# Patient Record
Sex: Male | Born: 1955
Health system: Southern US, Community
[De-identification: ages and names within clinical notes are randomized; demographics above are authoritative.]

## PROBLEM LIST (undated history)

## (undated) DIAGNOSIS — J449 Chronic obstructive pulmonary disease, unspecified: Secondary | ICD-10-CM

## (undated) DIAGNOSIS — F419 Anxiety disorder, unspecified: Secondary | ICD-10-CM

## (undated) DIAGNOSIS — I10 Essential (primary) hypertension: Secondary | ICD-10-CM

## (undated) DIAGNOSIS — I219 Acute myocardial infarction, unspecified: Secondary | ICD-10-CM

## (undated) DIAGNOSIS — I729 Aneurysm of unspecified site: Secondary | ICD-10-CM

## (undated) DIAGNOSIS — I2510 Atherosclerotic heart disease of native coronary artery without angina pectoris: Secondary | ICD-10-CM

## (undated) HISTORY — PX: TONSILLECTOMY: SUR1361

## (undated) HISTORY — DX: Anxiety disorder, unspecified: F41.9

## (undated) HISTORY — PX: ABDOMINAL AORTIC ANEURYSM REPAIR: SHX42

## (undated) HISTORY — PX: VASCULAR SURGERY: SHX849

## (undated) SURGERY — Surgical Case
Anesthesia: *Unknown

---

## 2000-08-30 ENCOUNTER — Encounter: Payer: Self-pay | Admitting: Emergency Medicine

## 2000-08-30 ENCOUNTER — Emergency Department (HOSPITAL_COMMUNITY): Admission: EM | Admit: 2000-08-30 | Discharge: 2000-08-30 | Payer: Self-pay | Admitting: Emergency Medicine

## 2008-12-29 ENCOUNTER — Emergency Department (HOSPITAL_BASED_OUTPATIENT_CLINIC_OR_DEPARTMENT_OTHER): Admission: EM | Admit: 2008-12-29 | Discharge: 2008-12-29 | Payer: Self-pay | Admitting: Emergency Medicine

## 2009-06-02 ENCOUNTER — Ambulatory Visit: Payer: Self-pay | Admitting: Pulmonary Disease

## 2009-06-02 ENCOUNTER — Ambulatory Visit: Payer: Self-pay | Admitting: Vascular Surgery

## 2009-06-02 ENCOUNTER — Inpatient Hospital Stay (HOSPITAL_COMMUNITY): Admission: EM | Admit: 2009-06-02 | Discharge: 2009-06-09 | Payer: Self-pay | Admitting: Emergency Medicine

## 2009-06-02 ENCOUNTER — Encounter: Payer: Self-pay | Admitting: Vascular Surgery

## 2009-06-16 ENCOUNTER — Ambulatory Visit: Payer: Self-pay | Admitting: Vascular Surgery

## 2009-06-20 ENCOUNTER — Ambulatory Visit: Payer: Self-pay | Admitting: Vascular Surgery

## 2009-08-08 ENCOUNTER — Ambulatory Visit: Payer: Self-pay | Admitting: Vascular Surgery

## 2009-08-22 ENCOUNTER — Ambulatory Visit: Payer: Self-pay | Admitting: Vascular Surgery

## 2009-09-05 ENCOUNTER — Ambulatory Visit: Payer: Self-pay | Admitting: Vascular Surgery

## 2009-10-14 ENCOUNTER — Emergency Department (HOSPITAL_BASED_OUTPATIENT_CLINIC_OR_DEPARTMENT_OTHER): Admission: EM | Admit: 2009-10-14 | Discharge: 2009-10-14 | Payer: Self-pay | Admitting: Emergency Medicine

## 2009-10-27 ENCOUNTER — Emergency Department (HOSPITAL_COMMUNITY): Admission: EM | Admit: 2009-10-27 | Discharge: 2009-10-27 | Payer: Self-pay | Admitting: Family Medicine

## 2010-02-06 ENCOUNTER — Ambulatory Visit: Payer: Self-pay | Admitting: Vascular Surgery

## 2010-08-18 IMAGING — CT CT PELVIS W/O CM
2 of 4 series · 16 of 46 positions shown, 18 images · IV contrast (APPLIED)
Comparison: None.

CT CHEST

CLINICAL DATA: Abdominal pain.  Hypotensive.  Unresponsive.

CT CHEST, ABDOMEN AND PELVIS WITHOUT CONTRAST
TECHNIQUE: Multidetector CT imaging of the chest, abdomen and
pelvis was performed following the standard protocol without IV
contrast.

[Series 2: c/a/p 5.0 b31f · axial · 0.70mm/px · z∈[-660,-30]mm · 13 of 140 slices shown, 15 images]
[im 7/140  soft-tissue]
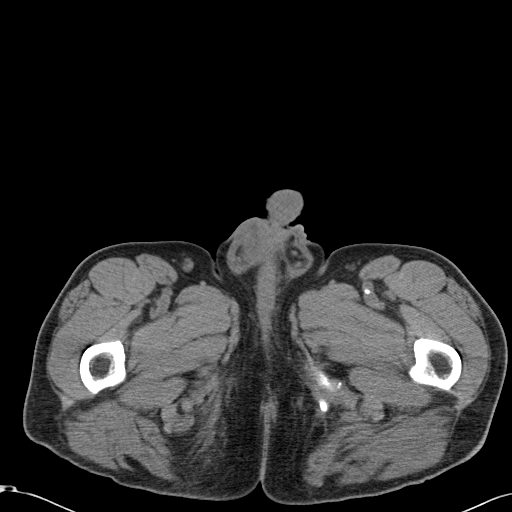
[im 7/140  bone]
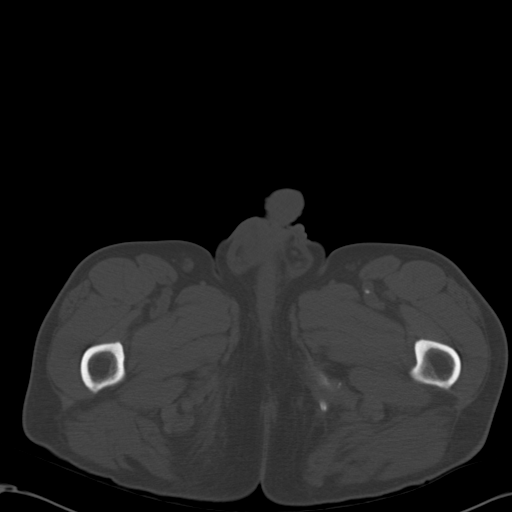
[im 19/140  soft-tissue]
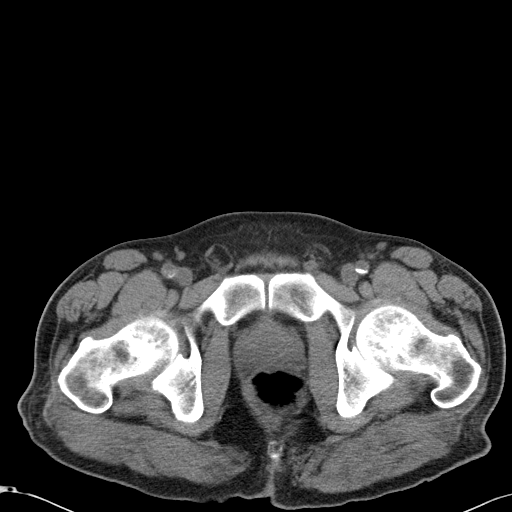
[im 32/140  soft-tissue]
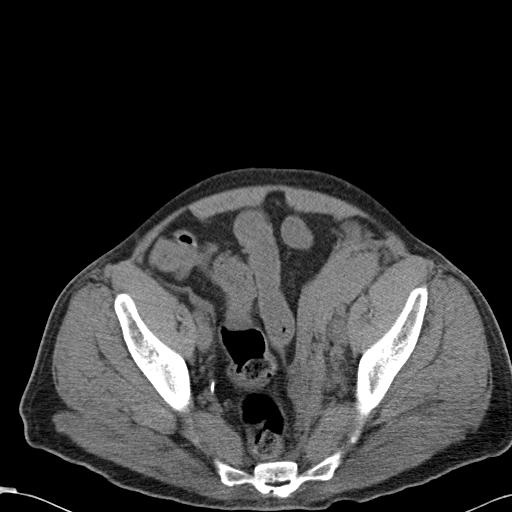
[im 38/140  soft-tissue]
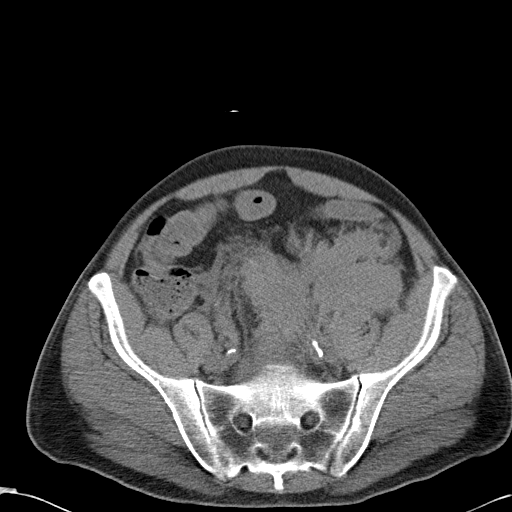
[im 51/140  soft-tissue]
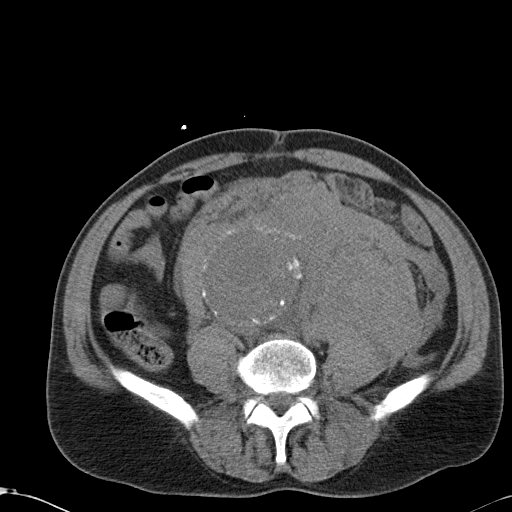
[im 57/140  soft-tissue]
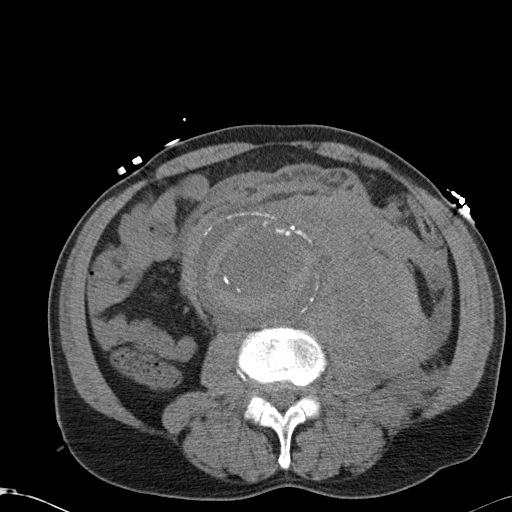
[im 70/140  soft-tissue]
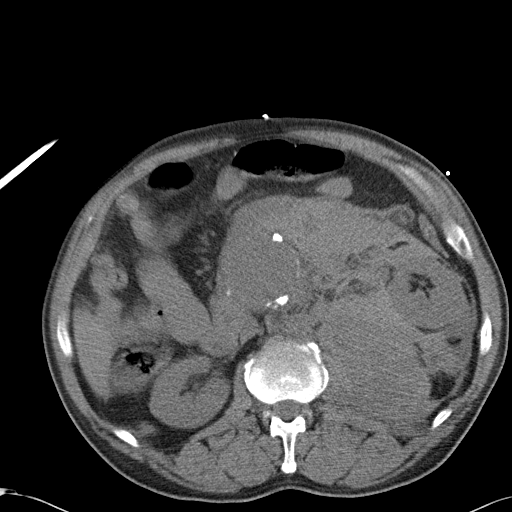
[im 83/140  soft-tissue]
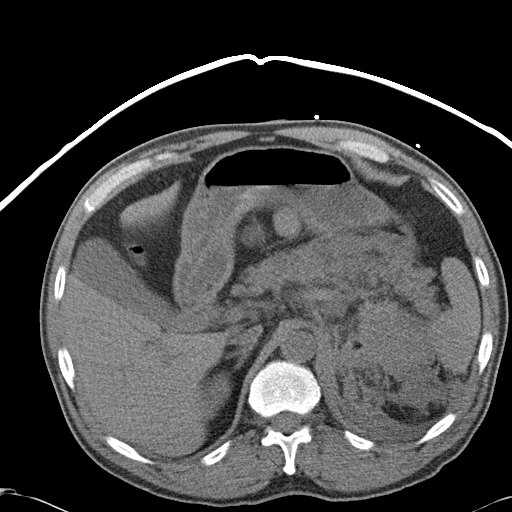
[im 89/140  soft-tissue]
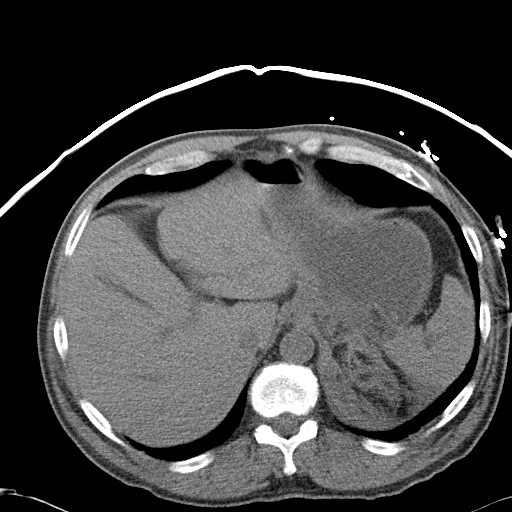
[im 89/140  bone]
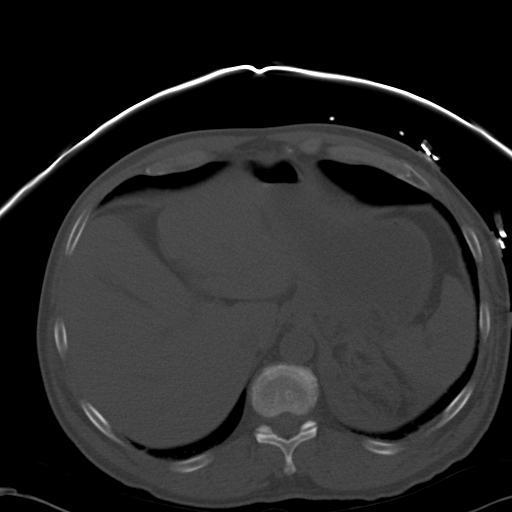
[im 102/140  soft-tissue]
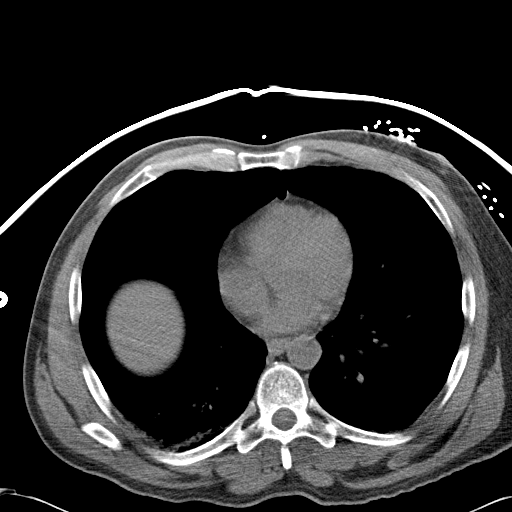
[im 108/140  soft-tissue]
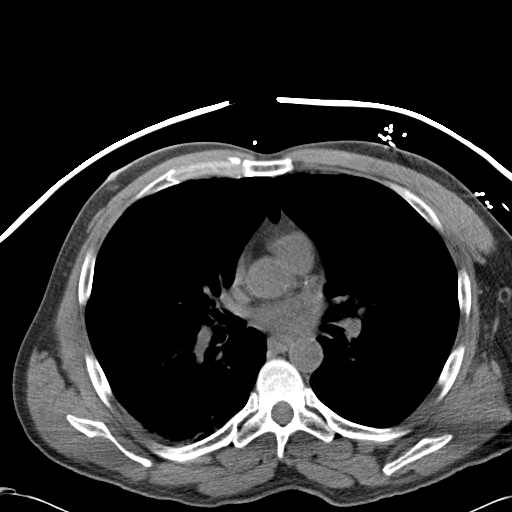
[im 121/140  soft-tissue]
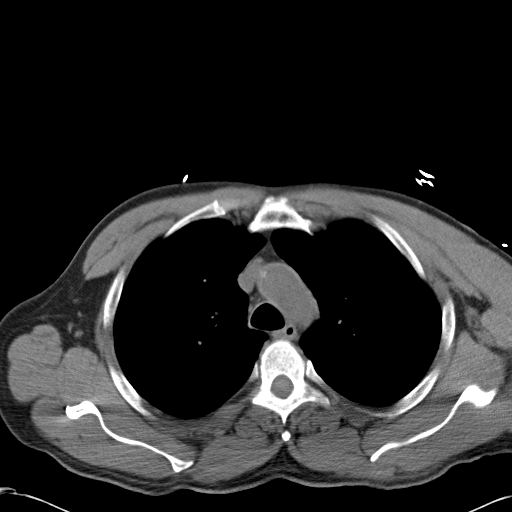
[im 133/140  soft-tissue]
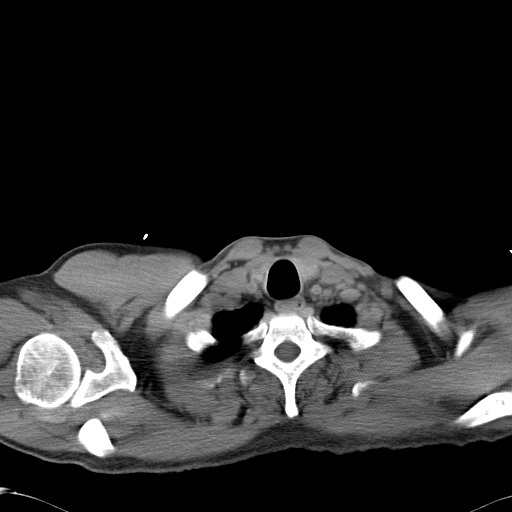

[Series 602: cor · coronal · 1.36mm/px · 3 of 128 slices shown]
[im 43/128  soft-tissue]
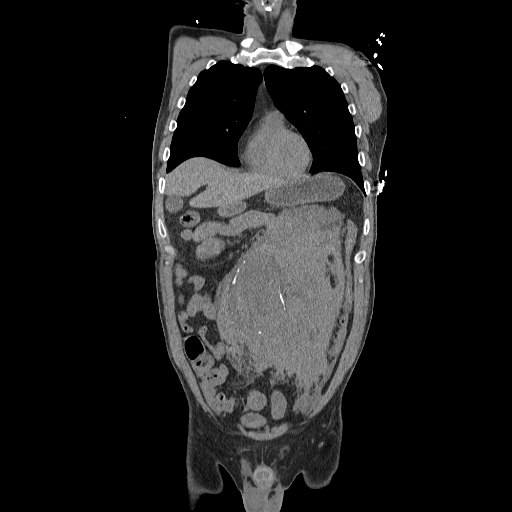
[im 57/128  soft-tissue]
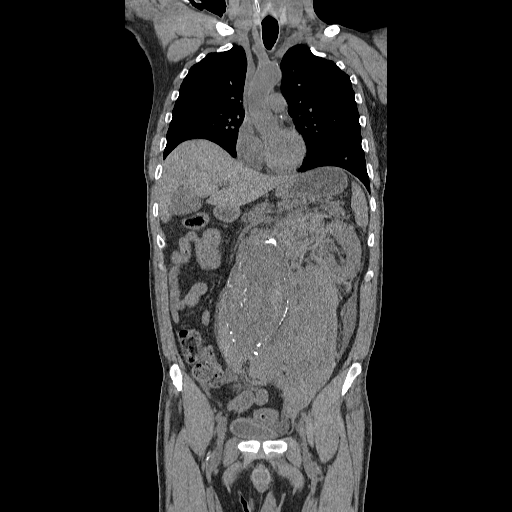
[im 71/128  soft-tissue]
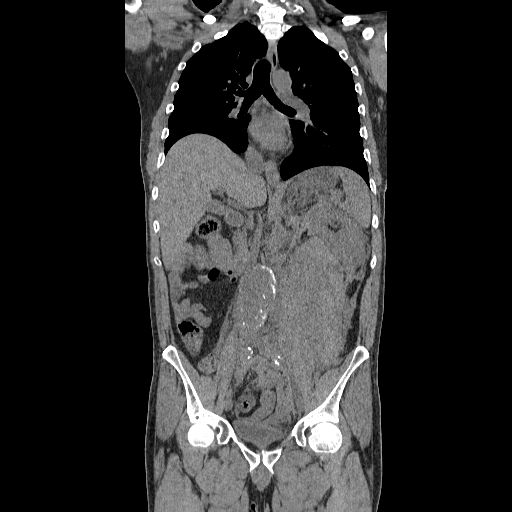

[16 of 46 positions shown; findings below may reference images not displayed]

FINDINGS: Diffuse low density of the intravascular blood relative
to the arterial walls, compatible with anemia.  Diffuse
peribronchial thickening.  Mild changes of COPD.  No masses or
enlarged lymph nodes.  No intrathoracic hemorrhage.  Mild thoracic
spine degenerative changes.
IMPRESSION: 1.  Mild changes of COPD and chronic bronchitis.
2.  Anemia.
3.  No acute abnormality.

CT ABDOMEN
FINDINGS: Huge distal abdominal aortic aneurysm measuring 8.8 cm
in maximum diameter.  Intimal defect and upward and lateral
displacement of the intima in the aorta on the left at the level of
the top of the right iliac crest.  Large retroperitoneal hematoma
on the left with some extension to the right.  This is causing
anterior and lateral displacement of the left kidney and extends
into the pelvis.  This is also displacing the aorta to the right.
Unremarkable spleen, liver, gallbladder and right adrenal gland.
The left adrenal gland is obscured by the hemorrhage.  The pancreas
is displaced anteriorly.  Lumbar spine degenerative changes with
associated grade 1 anterolisthesis at the L3-4 level.  Displaced
bowel loops.
IMPRESSION: Huge, ruptured abdominal aortic aneurysm with a large amount of
retroperitoneal hemorrhage, as described above.  Dr. Loton is
aware and has taken the the patient to the operating room.

CT PELVIS
FINDINGS: Large retroperitoneal hematoma in the upper pelvis
centered to the left of midline and extending into the inferior
pelvis on the left.  This is causing some displacement and
compression of the urinary bladder to the right.  This is also
displacing bowel loops.  Small amount of more simple appearing free
peritoneal fluid.  The hematoma is obscuring the common iliac
arteries.  The visualized portion of the left common iliac artery,
containing calcifications, does not appear dilated.  The visualized
portion of the right common iliac artery, containing
calcifications, appears mildly dilated, measuring 1.4 cm in maximum
diameter on image number 99.  Unremarkable pelvic bones.
IMPRESSION: 1.  Large retroperitoneal hematoma, as described above.
2.  1.4 cm right common iliac artery aneurysm.

## 2010-08-19 IMAGING — CR DG CHEST 1V PORT
1 series · 1 of 1 positions shown · non-contrast
Comparison: 06/02/2009.

CLINICAL DATA: Postop for AAA repair

PORTABLE CHEST - 1 VIEW

[AP]
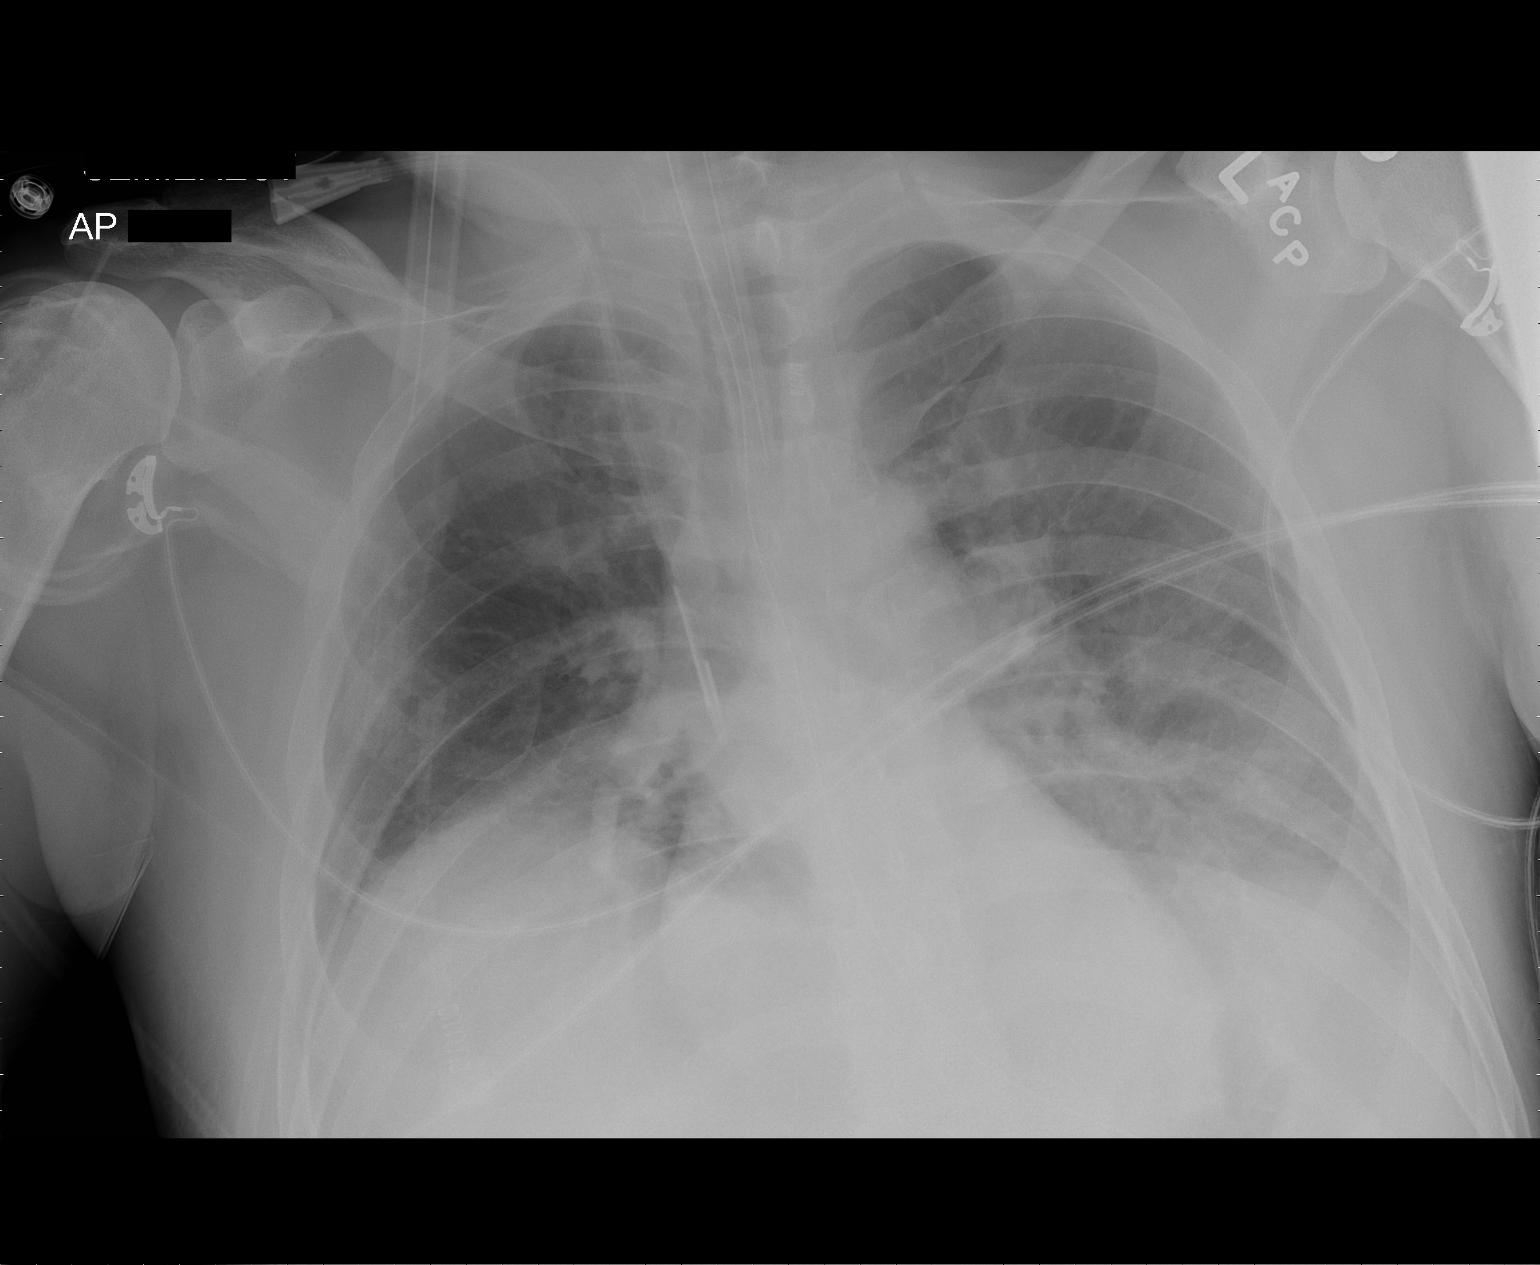

[1 of 1 positions shown; findings below may reference images not displayed]

FINDINGS: 3447 hours.  Endotracheal tube tip is 3.4 cm above the
base of the carina.  NG tube passes into the stomach although the
distal tip is not visualized.  Right IJ central venous catheter tip
projects at the distal SVC level.

Asymmetric elevation of the right hemidiaphragm is associated with
vascular congestion and bibasilar atelectasis. Probable small left
pleural effusion.
IMPRESSION: Slight increase in basilar atelectasis with underlying vascular
congestion.

## 2010-08-20 IMAGING — CR DG CHEST 1V PORT
1 series · 1 of 1 positions shown · non-contrast
Comparison: the previous day's study

CLINICAL DATA: Aortic aneurysm

PORTABLE CHEST - 1 VIEW

[view not recorded]
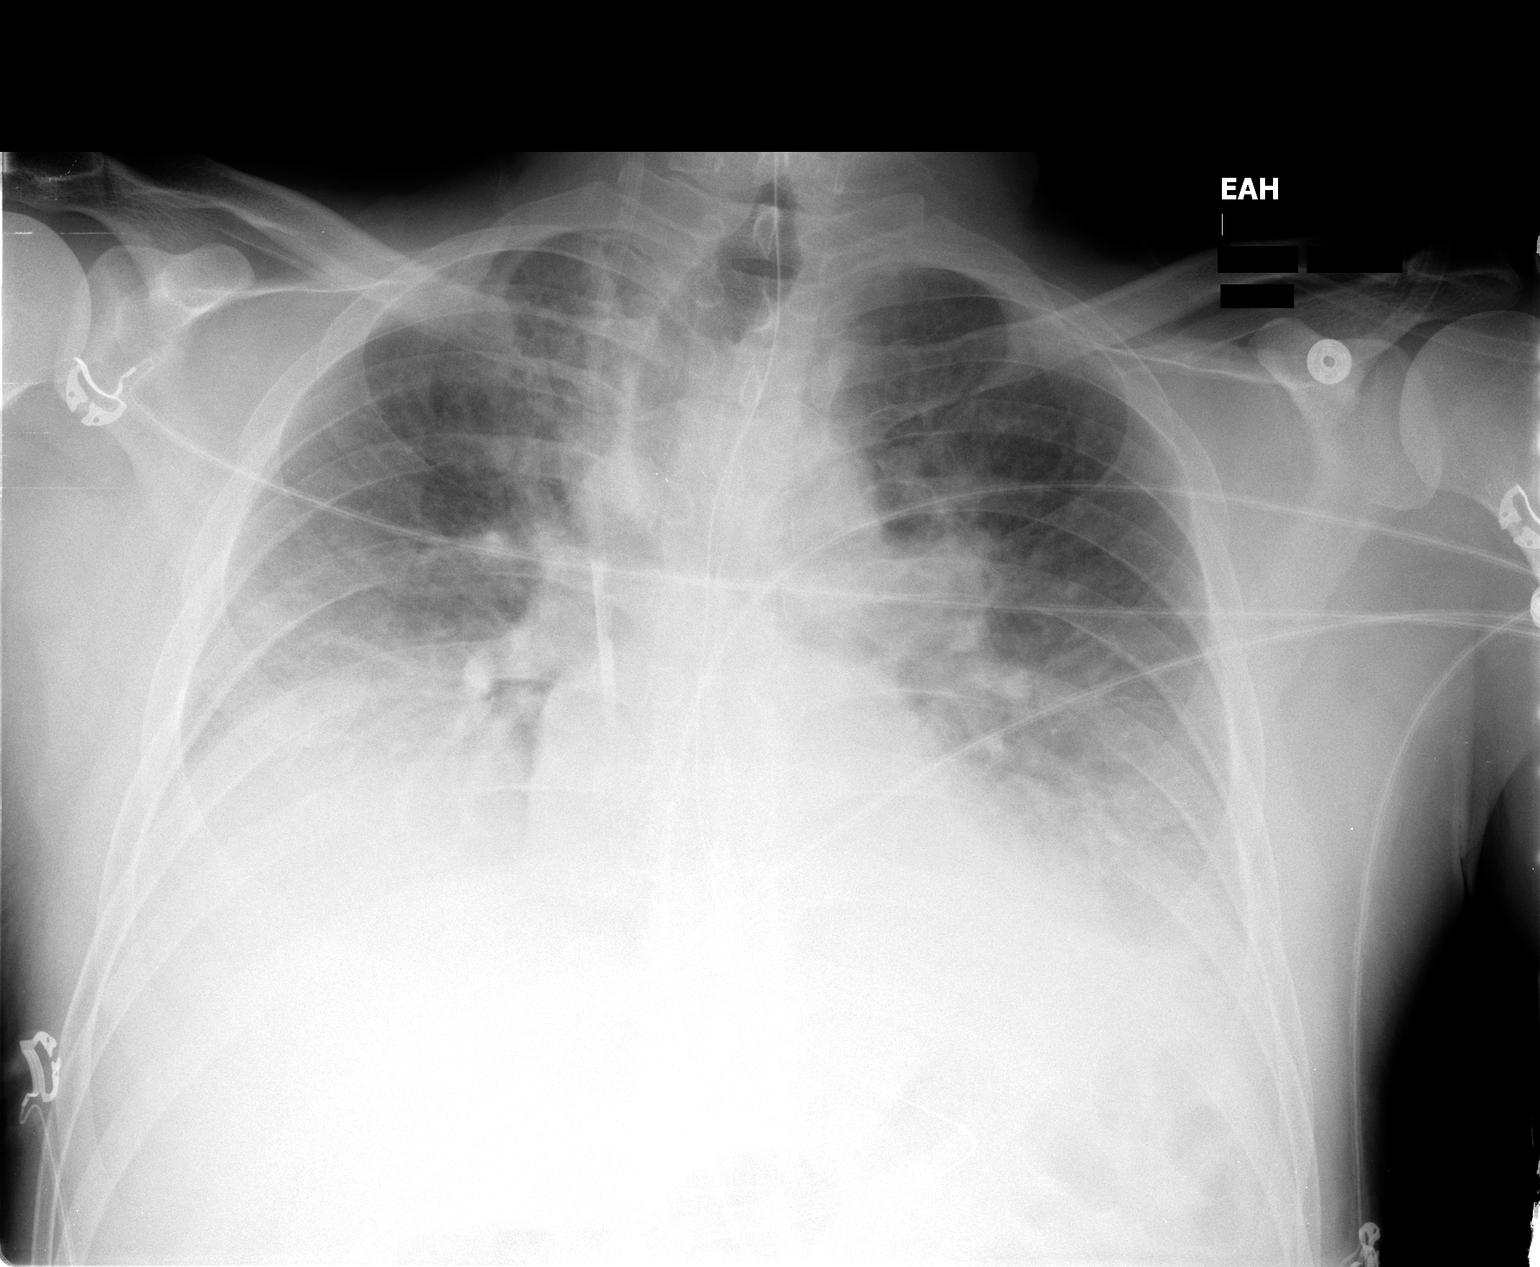

[1 of 1 positions shown; findings below may reference images not displayed]

FINDINGS: The patient has been extubated.  The nasogastric tube and
right IJ central line are stable in position.  Low lung volumes.
There are probable layering pleural effusions.  Associated
atelectasis or infiltrates in the lung bases.  Some increase in
conspicuity of interstitial edema or infiltrates.
IMPRESSION: 1.  Extubation with slight worsening of bilateral edema or
infiltrates with probable layering effusions.

## 2010-09-24 ENCOUNTER — Emergency Department (HOSPITAL_COMMUNITY)
Admission: EM | Admit: 2010-09-24 | Discharge: 2010-09-24 | Payer: Self-pay | Source: Home / Self Care | Admitting: Family Medicine

## 2011-02-02 LAB — TYPE AND SCREEN: Antibody Screen: NEGATIVE

## 2011-02-02 LAB — PREPARE PLATELETS

## 2011-02-02 LAB — PREPARE FRESH FROZEN PLASMA

## 2011-02-02 LAB — CBC
HCT: 21.7 % — ABNORMAL LOW (ref 39.0–52.0)
HCT: 35.1 % — ABNORMAL LOW (ref 39.0–52.0)
HCT: 36.1 % — ABNORMAL LOW (ref 39.0–52.0)
HCT: 36.8 % — ABNORMAL LOW (ref 39.0–52.0)
Hemoglobin: 10.9 g/dL — ABNORMAL LOW (ref 13.0–17.0)
Hemoglobin: 12.1 g/dL — ABNORMAL LOW (ref 13.0–17.0)
Hemoglobin: 12.4 g/dL — ABNORMAL LOW (ref 13.0–17.0)
Hemoglobin: 13 g/dL (ref 13.0–17.0)
Hemoglobin: 7.4 g/dL — CL (ref 13.0–17.0)
MCHC: 34.4 g/dL (ref 30.0–36.0)
MCHC: 34.5 g/dL (ref 30.0–36.0)
MCHC: 34.5 g/dL (ref 30.0–36.0)
MCHC: 34.6 g/dL (ref 30.0–36.0)
MCHC: 34.6 g/dL (ref 30.0–36.0)
MCV: 88.3 fL (ref 78.0–100.0)
MCV: 88.6 fL (ref 78.0–100.0)
MCV: 88.7 fL (ref 78.0–100.0)
MCV: 89.4 fL (ref 78.0–100.0)
MCV: 89.9 fL (ref 78.0–100.0)
MCV: 92.5 fL (ref 78.0–100.0)
Platelets: 111 10*3/uL — ABNORMAL LOW (ref 150–400)
Platelets: 123 10*3/uL — ABNORMAL LOW (ref 150–400)
Platelets: 73 10*3/uL — ABNORMAL LOW (ref 150–400)
RBC: 2.35 MIL/uL — ABNORMAL LOW (ref 4.22–5.81)
RBC: 3.13 MIL/uL — ABNORMAL LOW (ref 4.22–5.81)
RBC: 3.46 MIL/uL — ABNORMAL LOW (ref 4.22–5.81)
RBC: 3.53 MIL/uL — ABNORMAL LOW (ref 4.22–5.81)
RBC: 3.92 MIL/uL — ABNORMAL LOW (ref 4.22–5.81)
RBC: 4.02 MIL/uL — ABNORMAL LOW (ref 4.22–5.81)
RDW: 14.3 % (ref 11.5–15.5)
RDW: 14.7 % (ref 11.5–15.5)
RDW: 14.7 % (ref 11.5–15.5)
RDW: 14.8 % (ref 11.5–15.5)
RDW: 15.1 % (ref 11.5–15.5)
WBC: 10.7 10*3/uL — ABNORMAL HIGH (ref 4.0–10.5)
WBC: 12.1 10*3/uL — ABNORMAL HIGH (ref 4.0–10.5)
WBC: 17.9 10*3/uL — ABNORMAL HIGH (ref 4.0–10.5)
WBC: 7.2 10*3/uL (ref 4.0–10.5)
WBC: 7.8 10*3/uL (ref 4.0–10.5)

## 2011-02-02 LAB — PREPARE CRYOPRECIPITATE

## 2011-02-02 LAB — POCT I-STAT 7, (LYTES, BLD GAS, ICA,H+H)
Bicarbonate: 14.3 mEq/L — ABNORMAL LOW (ref 20.0–24.0)
Bicarbonate: 23.5 mEq/L (ref 20.0–24.0)
Calcium, Ion: 1 mmol/L — ABNORMAL LOW (ref 1.12–1.32)
Calcium, Ion: 1.56 mmol/L — ABNORMAL HIGH (ref 1.12–1.32)
HCT: 22 % — ABNORMAL LOW (ref 39.0–52.0)
HCT: 30 % — ABNORMAL LOW (ref 39.0–52.0)
Hemoglobin: 7.5 g/dL — CL (ref 13.0–17.0)
Hemoglobin: 8.2 g/dL — ABNORMAL LOW (ref 13.0–17.0)
Hemoglobin: 8.8 g/dL — ABNORMAL LOW (ref 13.0–17.0)
O2 Saturation: 100 %
O2 Saturation: 100 %
Patient temperature: 33.7
Patient temperature: 34.4
Patient temperature: 34.6
Potassium: 2.6 mEq/L — CL (ref 3.5–5.1)
Sodium: 141 mEq/L (ref 135–145)
TCO2: 16 mmol/L (ref 0–100)
TCO2: 25 mmol/L (ref 0–100)
pCO2 arterial: 41.6 mmHg (ref 35.0–45.0)
pCO2 arterial: 50.1 mmHg — ABNORMAL HIGH (ref 35.0–45.0)
pCO2 arterial: 51.9 mmHg — ABNORMAL HIGH (ref 35.0–45.0)
pH, Arterial: 7.05 — CL (ref 7.350–7.450)
pH, Arterial: 7.332 — ABNORMAL LOW (ref 7.350–7.450)
pH, Arterial: 7.401 (ref 7.350–7.450)
pO2, Arterial: 368 mmHg — ABNORMAL HIGH (ref 80.0–100.0)
pO2, Arterial: 501 mmHg — ABNORMAL HIGH (ref 80.0–100.0)

## 2011-02-02 LAB — AMYLASE: Amylase: 99 U/L (ref 27–131)

## 2011-02-02 LAB — PROTIME-INR
INR: 1.1 (ref 0.00–1.49)
INR: 1.2 (ref 0.00–1.49)
INR: 1.6 — ABNORMAL HIGH (ref 0.00–1.49)
Prothrombin Time: 15 seconds (ref 11.6–15.2)

## 2011-02-02 LAB — POCT I-STAT 3, ART BLOOD GAS (G3+)
Bicarbonate: 25.3 mEq/L — ABNORMAL HIGH (ref 20.0–24.0)
O2 Saturation: 99 %
TCO2: 27 mmol/L (ref 0–100)
pCO2 arterial: 34 mmHg — ABNORMAL LOW (ref 35.0–45.0)
pCO2 arterial: 36.3 mmHg (ref 35.0–45.0)
pCO2 arterial: 39.3 mmHg (ref 35.0–45.0)
pH, Arterial: 7.365 (ref 7.350–7.450)
pH, Arterial: 7.442 (ref 7.350–7.450)
pH, Arterial: 7.504 — ABNORMAL HIGH (ref 7.350–7.450)
pO2, Arterial: 143 mmHg — ABNORMAL HIGH (ref 80.0–100.0)
pO2, Arterial: 77 mmHg — ABNORMAL LOW (ref 80.0–100.0)

## 2011-02-02 LAB — BASIC METABOLIC PANEL
BUN: 4 mg/dL — ABNORMAL LOW (ref 6–23)
BUN: 7 mg/dL (ref 6–23)
CO2: 25 mEq/L (ref 19–32)
CO2: 25 mEq/L (ref 19–32)
CO2: 26 mEq/L (ref 19–32)
Calcium: 7.9 mg/dL — ABNORMAL LOW (ref 8.4–10.5)
Calcium: 8.3 mg/dL — ABNORMAL LOW (ref 8.4–10.5)
Calcium: 8.6 mg/dL (ref 8.4–10.5)
Calcium: 8.7 mg/dL (ref 8.4–10.5)
Chloride: 104 mEq/L (ref 96–112)
Chloride: 114 mEq/L — ABNORMAL HIGH (ref 96–112)
Creatinine, Ser: 0.54 mg/dL (ref 0.4–1.5)
Creatinine, Ser: 0.59 mg/dL (ref 0.4–1.5)
Creatinine, Ser: 0.59 mg/dL (ref 0.4–1.5)
Creatinine, Ser: 0.91 mg/dL (ref 0.4–1.5)
GFR calc Af Amer: >60 mL/min (ref >60)
GFR calc Af Amer: >60 mL/min (ref >60)
GFR calc Af Amer: >60 mL/min (ref >60)
GFR calc non Af Amer: >60 mL/min (ref >60)
GFR calc non Af Amer: >60 mL/min (ref >60)
GFR calc non Af Amer: >60 mL/min (ref >60)
Glucose, Bld: 135 mg/dL — ABNORMAL HIGH (ref 70–99)
Glucose, Bld: 152 mg/dL — ABNORMAL HIGH (ref 70–99)
Potassium: 2.7 mEq/L — CL (ref 3.5–5.1)
Potassium: 3.2 mEq/L — ABNORMAL LOW (ref 3.5–5.1)
Potassium: 3.8 mEq/L (ref 3.5–5.1)
Sodium: 138 mEq/L (ref 135–145)
Sodium: 139 mEq/L (ref 135–145)
Sodium: 140 mEq/L (ref 135–145)

## 2011-02-02 LAB — COMPREHENSIVE METABOLIC PANEL
ALT: 40 U/L (ref 0–53)
ALT: 45 U/L (ref 0–53)
AST: 30 U/L (ref 0–37)
AST: 68 U/L — ABNORMAL HIGH (ref 0–37)
Albumin: 2.4 g/dL — ABNORMAL LOW (ref 3.5–5.2)
Alkaline Phosphatase: 40 U/L (ref 39–117)
BUN: 5 mg/dL — ABNORMAL LOW (ref 6–23)
CO2: 24 mEq/L (ref 19–32)
CO2: 25 mEq/L (ref 19–32)
Chloride: 100 mEq/L (ref 96–112)
Chloride: 110 mEq/L (ref 96–112)
Creatinine, Ser: 0.57 mg/dL (ref 0.4–1.5)
Creatinine, Ser: 0.71 mg/dL (ref 0.4–1.5)
GFR calc Af Amer: >60 mL/min (ref >60)
GFR calc Af Amer: >60 mL/min (ref >60)
GFR calc non Af Amer: >60 mL/min (ref >60)
GFR calc non Af Amer: >60 mL/min (ref >60)
Glucose, Bld: 138 mg/dL — ABNORMAL HIGH (ref 70–99)
Glucose, Bld: 90 mg/dL (ref 70–99)
Potassium: 4 mEq/L (ref 3.5–5.1)
Sodium: 138 mEq/L (ref 135–145)
Total Bilirubin: 1.1 mg/dL (ref 0.3–1.2)
Total Bilirubin: 1.8 mg/dL — ABNORMAL HIGH (ref 0.3–1.2)
Total Protein: 4.4 g/dL — ABNORMAL LOW (ref 6.0–8.3)
Total Protein: 5.1 g/dL — ABNORMAL LOW (ref 6.0–8.3)

## 2011-02-02 LAB — DIFFERENTIAL
Eosinophils Absolute: 0.3 10*3/uL (ref 0.0–0.7)
Eosinophils Relative: 2 % (ref 0–5)
Lymphs Abs: 4.9 10*3/uL — ABNORMAL HIGH (ref 0.7–4.0)
Monocytes Relative: 4 % (ref 3–12)
Neutrophils Relative %: 65 % (ref 43–77)

## 2011-02-02 LAB — APTT: aPTT: 30 seconds (ref 24–37)

## 2011-02-02 LAB — FIBRINOGEN: Fibrinogen: 146 mg/dL — ABNORMAL LOW (ref 204–475)

## 2011-02-02 LAB — GLUCOSE, CAPILLARY: Glucose-Capillary: 124 mg/dL — ABNORMAL HIGH (ref 70–99)

## 2011-02-02 LAB — HEMOCCULT GUIAC POC 1CARD (OFFICE): Fecal Occult Bld: NEGATIVE

## 2011-03-12 NOTE — Assessment & Plan Note (Signed)
OFFICE VISIT   Anthony Coffey, Anthony Coffey  DOB:  [birth year]                                       08/22/2009  BJYNW#:[id number]   The patient returns 2-1/2 months post emergency surgery for ruptured  infrarenal abdominal aortic aneurysm, insertion aorto by common iliac  graft.  He has done extremely well since his surgery with no specific  complaints other than today having some continued mild generalized  weakness.  His appetite is back to normal, although he is still 10  pounds down from his preoperative weight of 170.  He is swallowing well,  his bowel movements are back to normal.  He is increasing his activity.   PHYSICAL EXAM:  Blood pressure 144/85, heart rate 77, respirations 16.  Abdominal incision well-healed.  No evidence of ventral hernia.  Has 3+  femoral, popliteal, dorsalis pedis pulses bilaterally.   I am very pleased with his result and encouraged him to continue to  resume normal activity as tolerated.  He will return to see [place] on a  p.r.n. basis.   Quita Skye Hart Rochester, M.D.  Electronically Signed   JDL/MEDQ  D:  08/22/2009  T:  08/23/2009  Job:  [id number]

## 2011-03-12 NOTE — H&P (Signed)
Anthony Coffey, Anthony Coffey                 ACCOUNT NO.:  [id number]   MEDICAL RECORD NO.:  [id number]          PATIENT TYPE:  INP   LOCATION:  2315                         FACILITY:  [facility]   PHYSICIAN:  Quita Skye. Hart Rochester, M.D.  DATE OF BIRTH:  [birth year]   DATE OF ADMISSION:  06/02/2009  DATE OF DISCHARGE:                              HISTORY & PHYSICAL   CHIEF COMPLAINT:  Ruptured abdominal aortic aneurysm.   HISTORY OF PRESENT ILLNESS:  This 55 year old male patient was found  unresponsive on his garage floor today.  EMS was called.  When they  arrived, he had become conscious and alert complaining of abdominal  pain.  He was brought to the [facility] Emergency Department where he  was found to have hematocrit of 22% and a large pulsatile mass in his  abdomen.  CT scan and ultrasound confirmed a 8-10 cm infrarenal  abdominal aortic aneurysm and Dr. Hart Rochester was consulted at that point,  which was about 6:20 p.m.  No history of hypertension, coronary artery  disease, COPD, or stroke according to the family.   SOCIAL HISTORY:  The patient does not use alcohol, otherwise  unavailable.  Other history is unavailable at this time.   PHYSICAL EXAMINATION:  VITAL SIGNS:  Blood pressure was 75/37, heart  rate 100, respirations 22.  GENERAL:  He is a middle-aged male who was obviously in distress with  severe abdominal pain and limited responsiveness.  He did follow  commands.  NECK:  Carotid pulses were 3+.  CHEST:  Clear.  ABDOMEN:  Distended with large pulsatile mass.  EXTREMITIES:  He had 2+ femoral pulses bilaterally with no popliteal or  distal pulses.  Both feet were pale and cool.   IMPRESSION:  Ruptured infrarenal abdominal aortic aneurysm with large  retroperitoneal hematoma.   PLAN:  Take the patient to the operating room immediately for  exploration, resection and grafting of abdominal aortic aneurysm.      Quita Skye Hart Rochester, M.D.  Electronically Signed     JDL/MEDQ  D:   06/03/2009  T:  06/03/2009  Job:  [id number]

## 2011-03-12 NOTE — Op Note (Signed)
Anthony Coffey, Anthony Coffey                 ACCOUNT NO.:  [id number]   MEDICAL RECORD NO.:  [id number]          PATIENT TYPE:  INP   LOCATION:  2315                         FACILITY:  [facility]   PHYSICIAN:  Quita Skye. Hart Rochester, M.D.  DATE OF BIRTH:  03/15/1956   DATE OF PROCEDURE:  06/02/2009  DATE OF DISCHARGE:                               OPERATIVE REPORT   PREOPERATIVE DIAGNOSIS:  Ruptured abdominal aortic aneurysm.   POSTOPERATIVE DIAGNOSIS:  Ruptured abdominal aortic aneurysm.   OPERATION:  Resection and grafting of ruptured infrarenal abdominal  aortic aneurysm with insertion of an aorta by common iliac graft using a  14 x 8-mm Hemashield Dacron graft.   SURGEON:  Quita Skye. Hart Rochester, MD   ASSISTANT:  Della Goo, PA-C   ANESTHESIA:  General endotracheal.   BRIEF HISTORY:  This patient developed abdominal pain at 4 p.m. today  and came to the [facility] Emergency Department later in the evening,  was found to have a large pulsatile mass.  Ultrasound and CT scan  confirmed a large 8-9 cm infrarenal abdominal aortic aneurysm with  rupture.  He was taken emergently to the operating room for surgical  repair.   PROCEDURE:  The patient was taken to the operating room, placed in  supine position emergently from the ER.  He was responsive, complaining  of abdominal pain with blood pressure in the 90-100 systolic range.  The  abdomen and groins were prepped with Betadine scrub and solution, and  draped in routine sterile manner prior to induction of general  anesthesia.  Radial arterial line was inserted and a Swan-Ganz catheter  inserted by Anesthesia.  At the beginning of the procedure while the  patient's blood pressure was carefully monitored, remained in 90-100  systolic range.  Midline incision was made from xyphoid to pubis,  carried down through subcutaneous tissue and linea alba.  Peritoneal  cavity was entered.  There was a large retroperitoneal hematoma over the  aorta.  There was  no free intraperitoneal blood, but a very large  retroperitoneal hematoma was present.  The aorta was occluded just below  the diaphragm using the aortic occluder to compress it.  The intestine  was reflected to right side, transverse colon elevated, and the hematoma  was entered being careful not to injure the small intestine, the  inferior vena cava, left renal vein, and all other adjacent structures.  Neck of the aneurysm was dissected free just distal to the renal  arteries and the clamp placed in the infrarenal position and the  proximal occluder relieved.  Both common iliac arteries were then  dissected free.  They were relatively normal in size, but had some  diffuse calcification.  They were clamped.  No heparin was given.  The  aneurysm was opened and large amount of laminated thrombus removed.  There was only one set of lumbars that were patent near the neck of the  aneurysm.  The common iliac arteries were transected a few centimeters  distal to their origin.  A #5 Fogarty catheter was passed down both  sides with small  amount of thrombus retrieved with excellent  backbleeding.  They were flushed with heparin saline.  A 14 x 8 mm  Hemashield Dacron graft was anastomosed end-to-end to the aortic stump  using continuous 3-0 Prolene buttressing this with strip of felt.  This  was checked for leaks, none were present.  End-to-end anastomoses were  done between each limb and the common iliac arteries with 5-0 Prolene  with left leg opened initially followed by the right leg.  No  hypotension occurred during this maneuver.  After occluding the aorta at  the beginning portion of the procedure, the patient stabilized, although  initially he did drop into the 60s and required epinephrine as well as  dopamine to maintain a blood pressure.  Following stabilization, he did  have a good urinary output throughout the procedure.  He was constantly  transfused during the procedure with bank  blood as well as Cell Saver  blood, and was given 6 units of fresh frozen plasma as well as 3 packs  of platelets and cryoprecipitate.  After completion of the anastomoses  and stabilization of the pressure, the aneurysm was closed over the  graft with continuous 3-0 Vicryl.  Retroperitoneum approximated with 3-0  Vicryl and following thorough irrigation, linea alba closed with #1  Prolene, skin with clips, sterile dressing applied.  The patient taken  to surgical intensive care unit on the ventilator in critical condition.  He as noted received 13 units of packed red cells, 900 mL of Cell Saver  blood, 6 units of fresh frozen plasma, 3 packs of platelets, and  cryoprecipitate.      Quita Skye Hart Rochester, M.D.  Electronically Signed     JDL/MEDQ  D:  06/02/2009  T:  06/03/2009  Job:  [id number]

## 2011-03-12 NOTE — Discharge Summary (Signed)
NAMEPHILIP, Anthony Coffey                 ACCOUNT NO.:  [id number]   MEDICAL RECORD NO.:  [id number]          PATIENT TYPE:  INP   LOCATION:  2014                         FACILITY:  [facility]   PHYSICIAN:  Quita Skye. Hart Rochester, M.D.  DATE OF BIRTH:  1955-12-19   DATE OF ADMISSION:  06/02/2009  DATE OF DISCHARGE:  06/09/2009                               DISCHARGE SUMMARY   ADMISSION DIAGNOSIS:  Ruptured abdominal aortic aneurysm.   FINAL DISCHARGE DIAGNOSES:  1. Ruptured abdominal aortic aneurysm, status post emergent open      repair.  2. Postoperative hypokalemia, supplemented.  3. Acute blood loss anemia secondary to ruptured abdominal aortic      aneurysm.  4. Ongoing tobacco abuse.`  5. Bilateral basilar atelectasis.  6. Postoperative hypomagnesemia, supplemented.   PROCEDURES:  Resection and grafting of ruptured infrarenal abdominal  aortic aneurysm with insertion of an aortic by common iliac graft using  a 14 x 8-mm Hemashield Dacron graft by Dr. Josephina Gip.   BRIEF HISTORY:  Mr. Anthony Coffey is a 55 year old male who developed abdominal  pain at 4 p.m. on June 02, 2009.  He came to the Emergency Department  at [facility] and was found to have a large pulsatile mass.  Ultrasound and CT confirmed an 8-9 cm infrarenal abdominal aortic  aneurysm with evidence of rupture.  Prior to coming to the ER, he had  had episode of unresponsiveness on the garage floor, but had regained  consciousness by the time EMS brought him to the emergency department.  Hematocrit was down at 22% in emergency department.  Vascular Surgery  emergent consultation was requested due to the finding of aneurysm with  evidence of rupture, and it was recommended he would be taken emergently  to the operating room for exploration and resection grafting of his  abdominal aortic aneurysm.   CONSULTS:  Critical care/Pulmonology.   HOSPITAL COURSE:  Anthony Coffey was admitted to [facility] on  June 02, 2009 with  finding of ruptured abdominal aortic aneurysm.  He  was taken emergently to the operating room and underwent the previously  mentioned procedure.  Postoperatively, he was transferred to the  surgical intensive care unit.  He received 13 units of packed red blood  cells, 900 mL of Cell Saver blood, 6 units of fresh frozen plasma, 3  packs of platelets and cryoprecipitate perioperatively.  Critical care  was requested to assist with ventilator management and postoperative  care while in the intensive care unit.  While in the unit, he was  successfully extubated on June 03, 2009.  He was also treated for  hypomagnesemia and hypocalcemia.  He was started on Ventolin and  Atrovent nebs postoperatively.  His chest x-ray shows lung volumes and  some atelectasis as well as vascular congestion.  It was also felt he  had some underlying COPD with his long tobacco use history.  He was  ultimately felt stable for transfer to telemetry unit 2000 on June 04, 2009.  He was requiring Dilaudid PCA for pain management, but overall  was doing well.  He remained n.p.o. for several days postoperatively as  he had some abdominal distention and no signs of good bowel return.  But  ultimately on June 07, 1999, was started slowly on liquids and  advanced as tolerated.  By that time, he was having bowel movements and  showing less distention.  He was ultimately weaned from the PCA and  started on Percocet for pain management.  By June 09, 2009, he was  tolerating a regular diet.  We are still having to manage him for  hypokalemia, but was overall showing improvement with potassium of 3.2  at discharge, which was supplemented further.  Incisions were healing  well without signs of infection.  His feet remained adequately perfused.  Dr. Hart Rochester also encouraged him to stop smoking during this  hospitalization.   At discharge, his vitals were stable with mild hypertension, solid blood  pressure ranging from  134-151 over the last 24 hours with diastolic  running between 74 and 80.  His oxygen saturation was 97% on room air.  He has been afebrile, heart rate was sinus rhythm in the 70s.   His most recent labs showed a sodium of 138, potassium of 3.2, which was  supplemented, chloride 104, CO2 26, glucose 102, BUN of 7, creatinine  0.49, calcium 8.5.  He had a total bilirubin of 1.8 on June 08, 2009,  which was up from 0.9 of June 04, 2009; however, he was not having  nausea or vomiting.  His alkaline phosphatase was 86, AST of 30, ALT of  45, total protein of 5.9, blood albumin of 2.6.  On June 08, 2009, his  white count was 7.2, hemoglobin 10.9, hematocrit of 31.3, platelet count  of 222.  Postop amylase of 50.   DISPOSITION:  Anthony Coffey was felt stable for discharge home on June 09, 2009 and felt to be improving condition.   DISCHARGE MEDICATIONS:  Percocet 5/325 mg one tablet p.o. q.4 h. p.r.n.  pain.   DISCHARGE INSTRUCTIONS:  Continue heart-healthy diet.  Encouraged to  stop smoking and given information on smoking cessation class.  He may  shower and clean incisions gently with soap and water.  Avoid driving  for the next 2 weeks, heavy lifting for the next 6 weeks.  He will  follow up with Dr. Hart Rochester in approximately 2 weeks with ABIs.  His  staples should come out in approximately 1 week.  He is to call us  sooner if he develops fever, redness or drainage from the incision site,  persistent nausea, vomiting, or abdominal pain.      Jerold Coombe, P.A.      Quita Skye Hart Rochester, M.D.  Electronically Signed    AWZ/MEDQ  D:  06/09/2009  T:  06/10/2009  Job:  [id number]   cc:   [facility] Office

## 2011-03-12 NOTE — Assessment & Plan Note (Signed)
OFFICE VISIT   Anthony Coffey, Anthony Coffey  DOB:  07-25-1956                                       06/20/2009  ZOXWR#:[id number]   The patient returns today now 18 days post resection and grafting of  ruptured abdominal aortic aneurysm with insertion of aortobi common  iliac graft.  He has done very well since his discharge from the  hospital.  He does feel slightly weak and has not had a normal appetite  but has good bowel movements and is increasing his activity.  He is  having some abdominal soreness at night which is slow to improve.  He  has no other specific complaints.   On exam blood pressure is 151/90, heart rate 77, respirations 16.  His  abdominal incisions are well-healed.  Skin staples have been removed and  there is no evidence of ventral hernia.  He has 3+ femoral and posterior  tibial pulses bilaterally.   I am very pleased with his early result and have given him a renewed  prescription for Tylox for pain #30.  He will increase his activity as  tolerated and will return to see [place] in 2 months.   Quita Skye Hart Rochester, M.D.  Electronically Signed   JDL/MEDQ  D:  06/20/2009  T:  06/21/2009  Job:  [id number]

## 2011-09-16 ENCOUNTER — Emergency Department (INDEPENDENT_AMBULATORY_CARE_PROVIDER_SITE_OTHER)
Admission: EM | Admit: 2011-09-16 | Discharge: 2011-09-16 | Disposition: A | Discharge status: Home / Self Care | Payer: Self-pay | Source: Home / Self Care | Attending: Emergency Medicine | Admitting: Emergency Medicine

## 2011-09-16 DIAGNOSIS — F411 Generalized anxiety disorder: Secondary | ICD-10-CM

## 2011-09-16 MED ORDER — BUSPIRONE HCL 10 MG PO TABS: 10.0000 mg | ORAL_TABLET | ORAL | Status: DC

## 2011-09-16 NOTE — ED Provider Notes (Addendum)
History     CSN: [id number] Arrival date & time: 09/16/2011 12:06 PM   First MD Initiated Contact with Patient 09/16/11 1210      Chief Complaint  Patient presents with  . Anxiety    Started1 month ago with worried about son in [place].  Will not be able to talk to him for 2 weeks or longer and he will not be home until Christmas.      (Consider location/radiation/quality/duration/timing/severity/associated sxs/prior treatment) HPI Comments: BEEN GETTING VERY ANXIOUS, ANXIOUS ON THE EDGE,,CAN'T STOP WORRYING  Patient is a 55 y.o. male presenting with anxiety. The history is provided by the patient. No language interpreter was used.  Anxiety This is a new problem. The current episode started more than 1 week ago. The problem has been gradually worsening. Pertinent negatives include no shortness of breath. The symptoms are aggravated by nothing. The symptoms are relieved by nothing. He has tried nothing for the symptoms.    History reviewed. No pertinent past medical history.  History reviewed. No pertinent past surgical history.  History reviewed. No pertinent family history.  History  Substance Use Topics  . Smoking status: Current Everyday Smoker -- 1.0 packs/day    Types: Cigarettes  . Smokeless tobacco: Never Used  . Alcohol Use: No      Review of Systems  Constitutional: Negative.  Negative for fever, diaphoresis and fatigue.  Respiratory: Negative for shortness of breath.     Allergies  Review of patient's allergies indicates no known allergies.  Home Medications   Current Outpatient Rx  Name Route Sig Dispense Refill  . BUSPIRONE HCL 10 MG PO TABS Oral Take 1 tablet (10 mg total) by mouth 1 day or 1 dose. 14 tablet 0    BP 134/82  Pulse 69  Temp(Src) 97.7 F (36.5 C) (Oral)  Resp 18  SpO2 99%  Physical Exam  Nursing note and vitals reviewed. Constitutional: He is oriented to person, place, and time. He appears well-developed and well-nourished.  No distress.  HENT:  Head: Normocephalic.  Neurological: He is alert and oriented to person, place, and time. He has normal strength. He displays normal reflexes. No cranial nerve deficit. He exhibits normal muscle tone. He displays a negative Romberg sign. Coordination normal.  Skin: Skin is warm. He is not diaphoretic.  Psychiatric: He has a normal mood and affect. His behavior is normal. Judgment and thought content normal.    ED Course  Procedures (including critical care time)  Labs Reviewed - No data to display No results found.   1. GAD (generalized anxiety disorder)       MDM  Son in [place] moderate anxiety GAD SCORE 20        Jimmie Molly, MD 09/16/11 1547  Jimmie Molly, MD 09/16/11 1548  Jimmie Molly, MD 09/16/11 1549

## 2011-09-16 NOTE — ED Notes (Signed)
Started1 month ago with worried about son in [place].  Will not be able to talk to him for 2 weeks or longer and he will not be home until Christmas.

## 2011-11-13 ENCOUNTER — Ambulatory Visit (INDEPENDENT_AMBULATORY_CARE_PROVIDER_SITE_OTHER): Payer: Self-pay | Admitting: Psychiatry

## 2011-11-13 ENCOUNTER — Encounter (HOSPITAL_COMMUNITY): Payer: Self-pay | Admitting: Psychiatry

## 2011-11-13 VITALS — BP 130/79 | HR 68 | Wt 163.0 lb

## 2011-11-13 DIAGNOSIS — F411 Generalized anxiety disorder: Secondary | ICD-10-CM

## 2011-11-13 DIAGNOSIS — F419 Anxiety disorder, unspecified: Secondary | ICD-10-CM

## 2011-11-13 MED ORDER — BUSPIRONE HCL 10 MG PO TABS: 10.0000 mg | ORAL_TABLET | Freq: Two times a day (BID) | ORAL | Status: DC

## 2011-11-13 NOTE — Patient Instructions (Signed)
If you continued to have anxiety symptoms or have any question or concern about the medication then call us. If you've interested in counseling or talk therapy and let us know on her next visit. Followup in 2 weeks.

## 2011-11-13 NOTE — Progress Notes (Signed)
Chief complaint "I am very anxious and cannot sleep"  History of present illness Patient is a 56 year old Caucasian separated currently unemployed male who came for his appointment for seeking treatment for his anxiety. Patient told he has anxiety for almost 2 years however it has been getting worse in past few months. Patient admitted his anxiety symptoms started when he was diagnosed with abdominal aneurysm in April 10, 2009 and required emergency surgery. Since then he has on and off anxiety but the past few months it has been constant and he cannot sleep. Other stressors are he cannot find work and worried about future and finances. He was worried about his son who is an Electronics engineer and serving [place] but recently come back and he is relieved. He admitted having poor concentration decreased energy unable to focus and cannot sleep. He admitted having racing thoughts and some loss of interest in his life. However he denies any anhedonia hopelessness helplessness or any active or passive suicidal thinking. He also denies any episodic panic attack or fear from height or in crowds. He denies any agitation anger mood swing or any violent behavior. In November he was seen at urgent care and prescribed BuSpar but was given only for 14 days. He admitted that BuSpar helped him but he was still have anxiety symptoms. Since he is out from his medication he has notice increased intensity office anxiety symptoms. Though he denies any crying spells but has been noticed more socially isolated and withdrawn. Currently he is looking for work however usually doing winter time he has difficulty finding work. 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  Past psychiatric history Patient denies any previous history of psychiatric inpatient treatment. He denies any history of suicidal attempt. He had tried BuSpar in November given at urgent  care.  Family history Patient reported his brother has anxiety and has been [facility] due to severe anxiety attack.  Psychosocial history  Patient is born and raised in [place]. He married in 04-10-1982 however his marriage last for only 8 months. He has one son who is 68 year old and servedin Electronics engineer. He is posted in [place]. Patient live with her mother. His father died in 2008/04/10 due to heart attack. Patient has some close friends who he socialize very often. Patient denies a history of sexual, physical or emotional abuse.  Education and work history Patient has high school education, she worked as a Electrical engineer and summer and during winter he usually odd  Jobs.  Alcohol and substance use history Patient has history of alcohol use in the past. He had to needed to I in 11-Apr-1991 and then in 04/10/02. The patient claimed that he has stopped drinking heavily however he does drink on occasion with friends but he denies any intoxication blackout seizure tremors or withdrawal symptoms. Denies any use of other illegal substances.  Medical history Patient has history of abdominal aortic aneurysm surgery in 04/10/2009.  Allergies Patient has no known drug allergies.  Mental status examination Patient is casually dressed and fairly groomed. He maintained fair eye contact. His speech is clear and coherent. His tone and volume was somewhat increased but he was relevant in conversation. His thought process is logical and goal-directed. He described his mood is anxious and his affect is constricted. He denies any active or passive suicidal thinking and homicidal thinking. There  no psychotic symptoms present at this time. His attention and concentration is fair. He has fair fund of knowledge. He's alert and oriented x3. His insight judgment and pulse control is okay.  Assessment Axis I generalized anxiety disorder, anxiety disorder due to general medical condition Axis II deferred Axis III see  medical history Axis IV to moderate Axis V 65-70  Plan At this time I do believe patient should continue BuSpar which had helped some of his symptoms in the past. However I recommended to take 10 mg twice a day as he was taking only once a day. Patient do not experience any side effects of BuSpar in the past however once again I have explained risks and benefits of medication. I also talked about consequences of alcohol use including under affective her medication. I recommended to seek counseling for anxiety however at this time patient denied but will consider on next followup. I recommended to call us if he has any question or concern about the medication and he feel worsening of the symptoms. I will see him again in 2 weeks

## 2011-12-02 ENCOUNTER — Ambulatory Visit (HOSPITAL_COMMUNITY): Payer: Self-pay | Admitting: Psychiatry

## 2011-12-11 ENCOUNTER — Ambulatory Visit (HOSPITAL_COMMUNITY): Payer: Self-pay | Admitting: Psychiatry

## 2012-04-29 ENCOUNTER — Emergency Department (HOSPITAL_COMMUNITY)
Admission: EM | Admit: 2012-04-29 | Discharge: 2012-04-29 | Disposition: A | Discharge status: Home / Self Care | Payer: Self-pay | Attending: Emergency Medicine | Admitting: Emergency Medicine

## 2012-04-29 ENCOUNTER — Encounter (HOSPITAL_COMMUNITY): Payer: Self-pay | Admitting: Emergency Medicine

## 2012-04-29 DIAGNOSIS — G4700 Insomnia, unspecified: Secondary | ICD-10-CM | POA: Insufficient documentation

## 2012-04-29 DIAGNOSIS — F411 Generalized anxiety disorder: Secondary | ICD-10-CM | POA: Insufficient documentation

## 2012-04-29 DIAGNOSIS — F17200 Nicotine dependence, unspecified, uncomplicated: Secondary | ICD-10-CM | POA: Insufficient documentation

## 2012-04-29 MED ORDER — LORAZEPAM 1 MG PO TABS: ORAL_TABLET | ORAL | Status: DC

## 2012-04-29 MED ORDER — LORAZEPAM 1 MG PO TABS
2.0000 mg | ORAL_TABLET | Freq: Once | ORAL | Status: AC
  Administered 2012-04-29: 2 mg via ORAL
  Filled 2012-04-29: qty 2

## 2012-04-29 NOTE — ED Notes (Signed)
Pt c/o inability to sleep x 1 week; pt sts hx of same; pt sts became more severe after AAA repair in 2010; pt sts some depression and possible anxiety; pt sts meds have not helped; pt denies other complaint

## 2012-04-29 NOTE — ED Provider Notes (Signed)
History     CSN: [id number]  Arrival date & time 04/29/12  0747   First MD Initiated Contact with Patient 04/29/12 [phone]      Chief Complaint  Patient presents with  . Insomnia    (Consider location/radiation/quality/duration/timing/severity/associated sxs/prior treatment) The history is provided by the patient.  pt c/o inability to sleep for approx. 1 week. No pain. No other sxs.     Past Medical History  Diagnosis Date  . Anxiety     Past Surgical History  Procedure Date  . Abdominal aortic aneurysm repair     History reviewed. No pertinent family history.  History  Substance Use Topics  . Smoking status: Current Everyday Smoker -- 1.0 packs/day    Types: Cigarettes  . Smokeless tobacco: Never Used  . Alcohol Use: No      Review of Systems  Psychiatric/Behavioral:       Insomnia  All other systems reviewed and are negative.    Allergies  Review of patient's allergies indicates no known allergies.  Home Medications   Current Outpatient Rx  Name Route Sig Dispense Refill  . UNISOM PO Oral Take 1 tablet by mouth at bedtime as needed. sleep      BP 139/86  Pulse 71  Temp 97.7 F (36.5 C) (Oral)  Resp 18  SpO2 98%  Physical Exam  Nursing note and vitals reviewed. Constitutional: He is oriented to person, place, and time. He appears well-developed and well-nourished. No distress.  HENT:  Head: Normocephalic and atraumatic.  Eyes: Conjunctivae are normal.  Neck: Normal range of motion.  Cardiovascular: Normal rate.   No murmur heard. Pulmonary/Chest: Effort normal and breath sounds normal. No respiratory distress.  Abdominal: Soft. There is no tenderness.  Musculoskeletal: Normal range of motion.  Neurological: He is alert and oriented to person, place, and time.  Skin: Skin is warm and dry.  Psychiatric: He has a normal mood and affect. Judgment and thought content normal.    ED Course  Procedures (including critical care time) No tests  indicated Will give ativan in ed and rx for home  Labs Reviewed - No data to display No results found.   No diagnosis found.    MDM  Insomnia No acute illness        Cheri Guppy, MD 04/29/12 [phone]

## 2012-08-14 ENCOUNTER — Emergency Department (HOSPITAL_COMMUNITY)
Admission: EM | Admit: 2012-08-14 | Discharge: 2012-08-14 | Disposition: A | Discharge status: Home / Self Care | Payer: Self-pay | Attending: Emergency Medicine | Admitting: Emergency Medicine

## 2012-08-14 ENCOUNTER — Encounter (HOSPITAL_COMMUNITY): Payer: Self-pay | Admitting: *Deleted

## 2012-08-14 DIAGNOSIS — F419 Anxiety disorder, unspecified: Secondary | ICD-10-CM

## 2012-08-14 DIAGNOSIS — F411 Generalized anxiety disorder: Secondary | ICD-10-CM | POA: Insufficient documentation

## 2012-08-14 DIAGNOSIS — F17200 Nicotine dependence, unspecified, uncomplicated: Secondary | ICD-10-CM | POA: Insufficient documentation

## 2012-08-14 LAB — CBC WITH DIFFERENTIAL/PLATELET
Eosinophils Absolute: 0.4 10*3/uL (ref 0.0–0.7)
Eosinophils Relative: 4 % (ref 0–5)
HCT: 45 % (ref 39.0–52.0)
Hemoglobin: 15.9 g/dL (ref 13.0–17.0)
Lymphs Abs: 2.9 10*3/uL (ref 0.7–4.0)
MCH: 31.3 pg (ref 26.0–34.0)
MCV: 88.6 fL (ref 78.0–100.0)
Monocytes Absolute: 0.5 10*3/uL (ref 0.1–1.0)
Monocytes Relative: 5 % (ref 3–12)
RBC: 5.08 MIL/uL (ref 4.22–5.81)

## 2012-08-14 LAB — COMPREHENSIVE METABOLIC PANEL
Alkaline Phosphatase: 86 U/L (ref 39–117)
BUN: 9 mg/dL (ref 6–23)
Calcium: 9.6 mg/dL (ref 8.4–10.5)
GFR calc Af Amer: >90 mL/min (ref >90)
Glucose, Bld: 86 mg/dL (ref 70–99)
Potassium: 3.6 mEq/L (ref 3.5–5.1)
Total Protein: 7.6 g/dL (ref 6.0–8.3)

## 2012-08-14 LAB — RAPID URINE DRUG SCREEN, HOSP PERFORMED: Opiates: NOT DETECTED

## 2012-08-14 MED ORDER — LORAZEPAM 1 MG PO TABS
1.0000 mg | ORAL_TABLET | Freq: Once | ORAL | Status: AC
  Administered 2012-08-14: 1 mg via ORAL
  Filled 2012-08-14: qty 1

## 2012-08-14 MED ORDER — LORAZEPAM 1 MG PO TABS: 1.0000 mg | ORAL_TABLET | Freq: Three times a day (TID) | ORAL | Status: DC | PRN

## 2012-08-14 NOTE — ED Notes (Addendum)
Pt stated still feeling anxious, could not be stay still, smoking every   "Stated need something for my nevous".

## 2012-08-14 NOTE — ED Notes (Signed)
Nervous breakdown x2 days. Not sleeping for x 2 days.

## 2012-08-14 NOTE — ED Provider Notes (Signed)
History     CSN: [id number]  Arrival date & time 08/14/12  1120   First MD Initiated Contact with Patient 08/14/12 1617      Chief Complaint  Patient presents with  . Anxiety    (Consider location/radiation/quality/duration/timing/severity/associated sxs/prior treatment) Patient is a 56 y.o. male presenting with anxiety. The history is provided by the patient.  Anxiety This is a new problem. The current episode started in the past 7 days. The problem occurs intermittently. The problem has been unchanged. Pertinent negatives include no chills or fever. Associated symptoms comments: He has had difficulty getting to sleep over the last 2 days because of worry. He states he is restless, can't concentrate. No hallucinations, SI/HI. No previous inpatient psychiatric admissions and he does not feel he needs to be in the hospital now. Has taken Ativan in the past. No chest pain, difficulty breathing, abdominal pain, N, V. No fever or cough..    Past Medical History  Diagnosis Date  . Anxiety     Past Surgical History  Procedure Date  . Abdominal aortic aneurysm repair     No family history on file.  History  Substance Use Topics  . Smoking status: Current Every Day Smoker -- 1.0 packs/day    Types: Cigarettes  . Smokeless tobacco: Never Used  . Alcohol Use: No      Review of Systems  Constitutional: Negative for fever and chills.  HENT: Negative.   Respiratory: Negative.   Cardiovascular: Negative.   Gastrointestinal: Negative.   Musculoskeletal: Negative.   Skin: Negative.   Neurological: Negative.   Psychiatric/Behavioral: Positive for disturbed wake/sleep cycle. Negative for hallucinations and self-injury. The patient is nervous/anxious.     Allergies  Review of patient's allergies indicates no known allergies.  Home Medications   Current Outpatient Rx  Name Route Sig Dispense Refill  . UNISOM PO Oral Take 1 tablet by mouth at bedtime as needed. sleep       BP 120/72  Pulse 70  Temp 98.7 F (37.1 C) (Oral)  Resp 16  Ht 5\' 9"  (1.753 m)  Wt 160 lb (72.576 kg)  BMI 23.63 kg/m2  SpO2 98%  Physical Exam  Constitutional: He appears well-developed and well-nourished.  HENT:  Head: Normocephalic.  Neck: Normal range of motion. Neck supple.  Cardiovascular: Normal rate and regular rhythm.   Pulmonary/Chest: Effort normal and breath sounds normal.  Abdominal: Soft. Bowel sounds are normal. There is no tenderness. There is no rebound and no guarding.  Musculoskeletal: Normal range of motion.  Neurological: He is alert. No cranial nerve deficit.  Skin: Skin is warm and dry. No rash noted.  Psychiatric: He has a normal mood and affect.    ED Course  Procedures (including critical care time)   Labs Reviewed  CBC WITH DIFFERENTIAL  COMPREHENSIVE METABOLIC PANEL  URINE RAPID DRUG SCREEN (HOSP PERFORMED)   No results found.   No diagnosis found.  1. Anxiety   MDM  He states he feels on the edge but that he does not feel unsafe, is not hallucinating. He feels current symptoms are the same as previously diagnosed anxiety. Family member at bedside reports he looks the same as previous anxiety problems. No concern for other pathology. Encouraged outpatient counseling and return if symptoms worsen.        Rodena Medin, PA-C 08/14/12 1643

## 2012-08-15 NOTE — ED Provider Notes (Signed)
Medical screening examination/treatment/procedure(s) were performed by non-physician practitioner and as supervising physician I was immediately available for consultation/collaboration.  Caiden Arteaga R. Azaliyah Kennard, MD 08/15/12 0053 

## 2013-04-12 ENCOUNTER — Emergency Department (HOSPITAL_BASED_OUTPATIENT_CLINIC_OR_DEPARTMENT_OTHER)
Admission: EM | Admit: 2013-04-12 | Discharge: 2013-04-12 | Disposition: A | Discharge status: Home / Self Care | Payer: Self-pay | Attending: Emergency Medicine | Admitting: Emergency Medicine

## 2013-04-12 ENCOUNTER — Encounter (HOSPITAL_BASED_OUTPATIENT_CLINIC_OR_DEPARTMENT_OTHER): Payer: Self-pay | Admitting: *Deleted

## 2013-04-12 DIAGNOSIS — F419 Anxiety disorder, unspecified: Secondary | ICD-10-CM

## 2013-04-12 DIAGNOSIS — Z8679 Personal history of other diseases of the circulatory system: Secondary | ICD-10-CM | POA: Insufficient documentation

## 2013-04-12 DIAGNOSIS — R079 Chest pain, unspecified: Secondary | ICD-10-CM | POA: Insufficient documentation

## 2013-04-12 DIAGNOSIS — F17200 Nicotine dependence, unspecified, uncomplicated: Secondary | ICD-10-CM | POA: Insufficient documentation

## 2013-04-12 DIAGNOSIS — K089 Disorder of teeth and supporting structures, unspecified: Secondary | ICD-10-CM | POA: Insufficient documentation

## 2013-04-12 DIAGNOSIS — Z79899 Other long term (current) drug therapy: Secondary | ICD-10-CM | POA: Insufficient documentation

## 2013-04-12 DIAGNOSIS — F411 Generalized anxiety disorder: Secondary | ICD-10-CM | POA: Insufficient documentation

## 2013-04-12 DIAGNOSIS — K0889 Other specified disorders of teeth and supporting structures: Secondary | ICD-10-CM

## 2013-04-12 HISTORY — DX: Aneurysm of unspecified site: I72.9

## 2013-04-12 MED ORDER — LORAZEPAM 1 MG PO TABS: 1.0000 mg | ORAL_TABLET | Freq: Three times a day (TID) | ORAL | Status: DC | PRN

## 2013-04-12 MED ORDER — HYDROCODONE-ACETAMINOPHEN 5-325 MG PO TABS: 2.0000 | ORAL_TABLET | ORAL | Status: DC | PRN

## 2013-04-12 NOTE — ED Notes (Signed)
Patient states he has had trouble sleeping for the past week, anxious, states his mind is racing and will not stop, took unisom and xanax but it didn't help, decreased appetite

## 2013-04-12 NOTE — ED Provider Notes (Signed)
History     CSN: [id number]  Arrival date & time 04/12/13  0919   First MD Initiated Contact with Patient 04/12/13 [phone]      Chief Complaint  Patient presents with  . Anxiety    (Consider location/radiation/quality/duration/timing/severity/associated sxs/prior treatment) Patient is a 57 y.o. male presenting with anxiety. The history is provided by the patient. No language interpreter was used.  Anxiety This is a recurrent problem. The current episode started in the past 7 days. The problem occurs constantly. The problem has been gradually worsening. Associated symptoms include chest pain. Nothing aggravates the symptoms. He has tried nothing for the symptoms. The treatment provided no relief.   Pt reports he has been unable to sleep and is feeling very anxious.   Pt reports his  Past Medical History  Diagnosis Date  . Anxiety   . Aneurysm     Past Surgical History  Procedure Laterality Date  . Abdominal aortic aneurysm repair      No family history on file.  History  Substance Use Topics  . Smoking status: Current Every Day Smoker -- 1.00 packs/day    Types: Cigarettes  . Smokeless tobacco: Never Used  . Alcohol Use: No      Review of Systems  Cardiovascular: Positive for chest pain.  All other systems reviewed and are negative.    Allergies  Review of patient's allergies indicates no known allergies.  Home Medications   Current Outpatient Rx  Name  Route  Sig  Dispense  Refill  . Doxylamine Succinate, Sleep, (UNISOM PO)   Oral   Take 1 tablet by mouth at bedtime as needed. sleep         . LORazepam (ATIVAN) 1 MG tablet   Oral   Take 1 tablet (1 mg total) by mouth 3 (three) times daily as needed for anxiety.   8 tablet   0     BP 146/84  Pulse 74  Temp(Src) 98.3 F (36.8 C) (Oral)  Ht 5\' 9"  (1.753 m)  Wt 160 lb (72.576 kg)  BMI 23.62 kg/m2  SpO2 100%  Physical Exam  Nursing note and vitals reviewed. Constitutional: He appears  well-developed and well-nourished.  HENT:  Head: Normocephalic.  Eyes: Pupils are equal, round, and reactive to light.  Neck: Normal range of motion. Neck supple.  Cardiovascular: Normal rate and normal heart sounds.   Pulmonary/Chest: Effort normal.  Abdominal: Soft.  Musculoskeletal: Normal range of motion.  Neurological: He is alert.  Skin: Skin is warm.  Psychiatric: He has a normal mood and affect.    ED Course  Procedures (including critical care time)  Labs Reviewed - No data to display No results found.   No diagnosis found.    MDM  Pt not suicidal or homicidal.   Pt is not currently in treatment.   Pt also complains of pain in his teeth from grinding.   Pt given rx for ativan.   Pt advised follow up at behavioral health,  Pt given hydrocodone 10 tablets for dental pain.   Pt given dental referral      Elson Areas, PA-C 04/12/13 1027

## 2013-04-12 NOTE — ED Provider Notes (Signed)
Medical screening examination/treatment/procedure(s) were performed by non-physician practitioner and as supervising physician I was immediately available for consultation/collaboration.    Nelia Shi, MD 04/12/13 [phone]

## 2013-04-20 ENCOUNTER — Emergency Department (HOSPITAL_BASED_OUTPATIENT_CLINIC_OR_DEPARTMENT_OTHER)
Admission: EM | Admit: 2013-04-20 | Discharge: 2013-04-20 | Disposition: A | Discharge status: Home / Self Care | Payer: Self-pay | Attending: Emergency Medicine | Admitting: Emergency Medicine

## 2013-04-20 ENCOUNTER — Encounter (HOSPITAL_BASED_OUTPATIENT_CLINIC_OR_DEPARTMENT_OTHER): Payer: Self-pay | Admitting: *Deleted

## 2013-04-20 DIAGNOSIS — F419 Anxiety disorder, unspecified: Secondary | ICD-10-CM

## 2013-04-20 DIAGNOSIS — G479 Sleep disorder, unspecified: Secondary | ICD-10-CM | POA: Insufficient documentation

## 2013-04-20 DIAGNOSIS — F411 Generalized anxiety disorder: Secondary | ICD-10-CM | POA: Insufficient documentation

## 2013-04-20 DIAGNOSIS — K029 Dental caries, unspecified: Secondary | ICD-10-CM | POA: Insufficient documentation

## 2013-04-20 DIAGNOSIS — R220 Localized swelling, mass and lump, head: Secondary | ICD-10-CM | POA: Insufficient documentation

## 2013-04-20 DIAGNOSIS — R51 Headache: Secondary | ICD-10-CM | POA: Insufficient documentation

## 2013-04-20 MED ORDER — PENICILLIN V POTASSIUM 250 MG PO TABS: 250.0000 mg | ORAL_TABLET | Freq: Four times a day (QID) | ORAL | Status: AC

## 2013-04-20 MED ORDER — OXYCODONE-ACETAMINOPHEN 5-325 MG PO TABS
1.0000 | ORAL_TABLET | Freq: Once | ORAL | Status: AC
  Administered 2013-04-20: 1 via ORAL
  Filled 2013-04-20 (×2): qty 1

## 2013-04-20 MED ORDER — LORAZEPAM 1 MG PO TABS: 1.0000 mg | ORAL_TABLET | Freq: Three times a day (TID) | ORAL | Status: DC | PRN

## 2013-04-20 NOTE — ED Notes (Signed)
Pt amb to room 1 with quick steady gait in nad. Pt reports tooth pain x 3 weeks, seen here last week for same, unable to get an appointment with a dentist. Pt states he needs more pain medication and more medication for his anxiety.

## 2013-04-20 NOTE — ED Provider Notes (Signed)
History    CSN: [id number] Arrival date & time 04/20/13  1057  First MD Initiated Contact with Patient 04/20/13 1155     Chief Complaint  Patient presents with  . Dental Pain   (Consider location/radiation/quality/duration/timing/severity/associated sxs/prior Treatment) Patient is a 57 y.o. male presenting with tooth pain and anxiety. The history is provided by the patient.  Dental Pain Location:  Upper Upper teeth location:  9/LU central incisor, 8/RU central incisor, 7/RU lateral incisor and 10/LU lateral incisor Quality:  Pulsating and throbbing Severity:  Moderate Onset quality:  Gradual Duration:  3 weeks Timing:  Constant Progression:  Worsening Chronicity:  Chronic Context: dental caries and poor dentition   Context: not abscess and not trauma   Relieved by:  Nothing Worsened by:  Cold food/drink, hot food/drink and touching Ineffective treatments:  None tried Associated symptoms: facial pain and gum swelling   Associated symptoms: no difficulty swallowing, no facial swelling, no fever, no neck pain and no trismus   Risk factors: periodontal disease and smoking   Risk factors: no diabetes   Anxiety This is a chronic problem. The current episode started more than 1 week ago. The problem occurs constantly. The problem has been gradually worsening. Associated symptoms comments: States ran out of ativan several weeks ago and anxiety is getting worse.  Tried to f/u with psych but does not have appt till July . The symptoms are aggravated by stress. The symptoms are relieved by medications. He has tried nothing for the symptoms. The treatment provided no relief.   History reviewed. No pertinent past medical history. History reviewed. No pertinent past surgical history. History reviewed. No pertinent family history. History  Substance Use Topics  . Smoking status: Not on file  . Smokeless tobacco: Not on file  . Alcohol Use: Not on file    Review of Systems    Constitutional: Negative for fever.  HENT: Negative for facial swelling and neck pain.   Psychiatric/Behavioral: Positive for sleep disturbance. Negative for suicidal ideas. The patient is nervous/anxious.   All other systems reviewed and are negative.    Allergies  Review of patient's allergies indicates no known allergies.  Home Medications   Current Outpatient Rx  Name  Route  Sig  Dispense  Refill  . LORazepam (ATIVAN) 1 MG tablet   Oral   Take 1 tablet (1 mg total) by mouth 3 (three) times daily as needed for anxiety.   20 tablet   0   . penicillin v potassium (VEETID) 250 MG tablet   Oral   Take 1 tablet (250 mg total) by mouth 4 (four) times daily.   40 tablet   0    BP 129/82  Pulse 80  Temp(Src) 98.2 F (36.8 C) (Oral)  Resp 18  SpO2 98% Physical Exam  Nursing note and vitals reviewed. Constitutional: He is oriented to person, place, and time. He appears well-developed and well-nourished. No distress.  HENT:  Head: Normocephalic and atraumatic. No trismus in the jaw.  Mouth/Throat: Oropharynx is clear and moist. Dental caries present. No dental abscesses or edematous.    Almost complete loss of front teeth.  Just small portions of black teeth left near the gum  Eyes: Conjunctivae and EOM are normal. Pupils are equal, round, and reactive to light.  Neck: Normal range of motion. Neck supple.  Cardiovascular: Normal rate, regular rhythm and intact distal pulses.   No murmur heard. Pulmonary/Chest: Effort normal and breath sounds normal. No respiratory distress. He has  no wheezes. He has no rales.  Abdominal: Soft.  Musculoskeletal: Normal range of motion. He exhibits no edema and no tenderness.  Lymphadenopathy:    He has no cervical adenopathy.  Neurological: He is alert and oriented to person, place, and time.  Skin: Skin is warm and dry. No rash noted. No erythema.  Psychiatric: His behavior is normal. His mood appears anxious.    ED Course   Procedures (including critical care time) Labs Reviewed - No data to display No results found. 1. Dental caries   2. Anxiety     MDM   Pt with multiple dental caries and no facial swelling.  No signs of ludwig's angina or difficulty swallowing and no systemic symptoms. Will treat with PCN and have pt f/u with dentist.  Secondly pt states he needs treatment for his anxiety.  Denies SI or HI but states out of ativan and his nerves are getting to him.  Has appt in July to see psychiatry but never received formal treatment for anxiety.  Given ativan and resource guide.   Gwyneth Sprout, MD 04/20/13 1431

## 2013-05-11 ENCOUNTER — Encounter (HOSPITAL_COMMUNITY): Payer: Self-pay | Admitting: Psychiatry

## 2013-05-11 ENCOUNTER — Ambulatory Visit (INDEPENDENT_AMBULATORY_CARE_PROVIDER_SITE_OTHER): Payer: Self-pay | Admitting: Psychiatry

## 2013-05-11 VITALS — BP 134/81 | HR 76 | Ht 67.95 in | Wt 158.6 lb

## 2013-05-11 DIAGNOSIS — F411 Generalized anxiety disorder: Secondary | ICD-10-CM

## 2013-05-11 MED ORDER — LORAZEPAM 0.5 MG PO TABS: 0.5000 mg | ORAL_TABLET | Freq: Two times a day (BID) | ORAL | Status: DC | PRN

## 2013-05-11 MED ORDER — BUSPIRONE HCL 10 MG PO TABS: 10.0000 mg | ORAL_TABLET | Freq: Two times a day (BID) | ORAL | Status: DC

## 2013-05-11 NOTE — Progress Notes (Signed)
[facility] [id number] Progress Note  Anthony Coffey [id number] 57 y.o.  05/11/2013 3:34 PM  Chief Complaint: I'm having a lot of anxiety and I cannot sleep.  History of Present Illness: Patient is 57 year old Caucasian separated man who is referred from emergency department because of anxiety.  He was last seen in January 2013.  At that time he was taking BuSpar 10 mg twice a day from urgent care.  He was doing much better however his refill was not renewed by urgent care and patient started having increasing anxiety and nervousness.  His been visiting emergency room and getting Ativan 1 mg which he takes up to twice a day.  Patient endorse having panic attack, nervousness, unable to focus, unable to sleep and persistent nervousness.  It member BuSpar was working well for him but could not get refill.  His son is now living in [place] state.  Patient is to living with his mother.  He could not find job.  Patient admitted racing thoughts, some crying spells, loss of interest in his life but denies any anhedonia, hopelessness or helplessness.  In any active or passive suicidal thoughts.  He denies any fear from height.  Is not drinking or using any illegal substance.  Is willing to go back on BuSpar.   Suicidal Ideation: No Plan Formed: No Patient has means to carry out plan: No  Homicidal Ideation: No Plan Formed: No Patient has means to carry out plan: No  Review of Systems: Psychiatric: Agitation: No Hallucination: No Depressed Mood: Yes Insomnia: Yes Hypersomnia: No Altered Concentration: No Feels Worthless: No Grandiose Ideas: No Belief In Special Powers: No New/Increased Substance Abuse: No Compulsions: No  Neurologic: Headache: No Seizure: No Paresthesias: No  Past Medical Family, Social History: Patient has history of abdominal aneurysm and requires emergency surgery in 2009/03/13.  He has no current primary care physician.  He is born and raised in [place].  He married  in 03-13-1982 however his marriage last for only 8 months. He has one son who served in Electronics engineer. Patient live with her mother. His father died in Mar 13, 2008 due to heart attack. Patient has some close friends who he socialize very often. Patient denies a history of sexual, physical or emotional abuse.  Alcohol and substance use history. Patient has history of alcohol use in the past.  He claims to be sober in recent years.  He denies any history of intoxication, blackouts, seizures, tremors or any withdrawal symptoms.  Denies any illegal substance use.  Outpatient Encounter Prescriptions as of 05/11/2013  Medication Sig Dispense Refill  . [DISCONTINUED] LORazepam (ATIVAN) 1 MG tablet Take 1 tablet (1 mg total) by mouth 3 (three) times daily as needed for anxiety.  8 tablet  0  . busPIRone (BUSPAR) 10 MG tablet Take 1 tablet (10 mg total) by mouth 2 (two) times daily.  60 tablet  0  . Doxylamine Succinate, Sleep, (UNISOM PO) Take 1 tablet by mouth at bedtime as needed. sleep      . HYDROcodone-acetaminophen (NORCO/VICODIN) 5-325 MG per tablet Take 2 tablets by mouth every 4 (four) hours as needed.  10 tablet  0  . LORazepam (ATIVAN) 0.5 MG tablet Take 1 tablet (0.5 mg total) by mouth 2 (two) times daily as needed for anxiety.  30 tablet  0  . [DISCONTINUED] LORazepam (ATIVAN) 0.5 MG tablet Take 1 tablet (0.5 mg total) by mouth 2 (two) times daily as needed for anxiety.  30 tablet  0  . [  DISCONTINUED] LORazepam (ATIVAN) 1 MG tablet Take 1 tablet (1 mg total) by mouth 3 (three) times daily as needed for anxiety.  21 tablet  0  . [DISCONTINUED] LORazepam (ATIVAN) 1 MG tablet Take 1 tablet (1 mg total) by mouth 3 (three) times daily as needed for anxiety.  20 tablet  0   No facility-administered encounter medications on file as of 05/11/2013.    Past Psychiatric History/Hospitalization(s): Anxiety: Yes Bipolar Disorder: No Depression: No Mania: No Psychosis: No Schizophrenia: No Personality Disorder:  No Hospitalization for psychiatric illness: No History of Electroconvulsive Shock Therapy: No Prior Suicide Attempts: No  Physical Exam: Constitutional:  BP 134/81  Pulse 76  Ht 5' 7.95" (1.726 m)  Wt 158 lb 9.6 oz (71.94 kg)  BMI 24.15 kg/m2  General Appearance: alert, oriented, no acute distress and well nourished  Musculoskeletal: Strength & Muscle Tone: within normal limits Gait & Station: normal Patient leans: N/A  Psychiatric: Speech (describe rate, volume, coherence, spontaneity, and abnormalities if any): Clear and coherent.  Thought Process (describe rate, content, abstract reasoning, and computation): Logical and goal-directed  Associations: Intact  Thoughts: normal  Mental Status: Orientation: oriented to person, place, time/date, month of year and year Mood & Affect: anxiety Attention Span & Concentration: Fair  Medical Decision Making (Choose Three): New problem, with additional work up planned, Review of Psycho-Social Stressors (1), Review and summation of old records (2), Established Problem, Worsening (2), Review of Last Therapy Session (1), Review of Medication Regimen & Side Effects (2) and Review of New Medication or Change in Dosage (2)  Assessment: Axis I: Generalized anxiety disorder, anxiety disorder due to general medical condition  Axis II: Deferred  Axis III: History of abdominal aortic aneurysm , status post emergency surgery  Axis IV: Moderate  Axis V: 50-55   Plan: I reviewed his chart, previous medication and response to the medication.  I believe patient has been decompensating slowly since he's not taking any anxiolytic.  He had a good response with BuSpar.  We will restart BuSpar 10 mg twice a day.  I will provide Ativan 0.5 mg as needed until BuSpar start working.  I talked about benzodiazepine dependence, withdrawal intolerance.  Recommend to use only as needed until may develop tolerance and dependence.  Patient agree with the plan.   He does not want counseling at this time.  Recommend to call us back if he is any question of conservatively worsening of the symptom.  I will see him again in 3 weeks.  Esli Jernigan T., MD 05/11/2013

## 2013-05-13 ENCOUNTER — Ambulatory Visit (HOSPITAL_COMMUNITY): Payer: Self-pay | Admitting: Psychiatry

## 2013-05-21 ENCOUNTER — Telehealth (HOSPITAL_COMMUNITY): Payer: Self-pay

## 2013-05-21 ENCOUNTER — Ambulatory Visit (HOSPITAL_COMMUNITY): Payer: Self-pay | Admitting: Psychiatry

## 2013-05-24 ENCOUNTER — Other Ambulatory Visit (HOSPITAL_COMMUNITY): Payer: Self-pay | Admitting: Psychiatry

## 2013-05-24 DIAGNOSIS — F411 Generalized anxiety disorder: Secondary | ICD-10-CM

## 2013-05-25 ENCOUNTER — Other Ambulatory Visit (HOSPITAL_COMMUNITY): Payer: Self-pay | Admitting: *Deleted

## 2013-05-25 ENCOUNTER — Telehealth (HOSPITAL_COMMUNITY): Payer: Self-pay | Admitting: *Deleted

## 2013-05-25 NOTE — Telephone Encounter (Signed)
Pt called requesting a refill of his Ativan, spoke with him about taking it on an "as needed" basis. Pt states he had not had it in a while and this month he has taken it twice daily. Did confirm his next appointment for August 18th to which he agreed. Explained his refill has been called in to his pharmacy.

## 2013-06-03 ENCOUNTER — Encounter (HOSPITAL_COMMUNITY): Payer: Self-pay | Admitting: Pharmacy Technician

## 2013-06-07 ENCOUNTER — Ambulatory Visit (HOSPITAL_COMMUNITY): Payer: Self-pay | Admitting: Psychiatry

## 2013-06-08 NOTE — Pre-Procedure Instructions (Signed)
MORSE BRUEGGEMANN  06/08/2013   Your procedure is scheduled on:  June 14, 2013 at 2:30 PM  Report to [facility] at 11:30 AM.  Call this number if you have problems the morning of surgery: [phone]   Remember:   Do not eat food or drink liquids after midnight.   Take these medicines the morning of surgery with A SIP OF WATER: LORazepam (ATIVAN), HYDROcodone-acetaminophen (NORCO/VICODIN), busPIRone (BUSPAR)      Do not wear jewelry, make-up or nail polish.  Do not wear lotions, powders, or perfumes. You may wear deodorant.  Do not shave 48 hours prior to surgery. Men may shave face and neck.  Do not bring valuables to the hospital.  [facility] is not responsible                   for any belongings or valuables.  Contacts, dentures or bridgework may not be worn into surgery.  Leave suitcase in the car. After surgery it may be brought to your room.  For patients admitted to the hospital, checkout time is 11:00 AM the day of  discharge.   Patients discharged the day of surgery will not be allowed to drive  home.  Name and phone number of your driver: Family/friend  Special Instructions: Shower using CHG 2 nights before surgery and the night before surgery.  If you shower the day of surgery use CHG.  Use special wash - you have one bottle of CHG for all showers.  You should use approximately 1/3 of the bottle for each shower.   Please read over the following fact sheets that you were given: Pain Booklet, Coughing and Deep Breathing and Surgical Site Infection Prevention

## 2013-06-09 ENCOUNTER — Ambulatory Visit (HOSPITAL_COMMUNITY)
Admission: RE | Admit: 2013-06-09 | Discharge: 2013-06-09 | Disposition: A | Discharge status: Home / Self Care | Payer: Self-pay | Source: Ambulatory Visit | Attending: Anesthesiology | Admitting: Anesthesiology

## 2013-06-09 ENCOUNTER — Encounter (HOSPITAL_COMMUNITY)
Admission: RE | Admit: 2013-06-09 | Discharge: 2013-06-09 | Disposition: A | Discharge status: Home / Self Care | Payer: Self-pay | Source: Ambulatory Visit | Attending: Oral Surgery | Admitting: Oral Surgery

## 2013-06-09 ENCOUNTER — Encounter (HOSPITAL_COMMUNITY): Payer: Self-pay

## 2013-06-09 DIAGNOSIS — Z01818 Encounter for other preprocedural examination: Secondary | ICD-10-CM | POA: Insufficient documentation

## 2013-06-09 DIAGNOSIS — M8569 Other cyst of bone, multiple sites: Secondary | ICD-10-CM | POA: Insufficient documentation

## 2013-06-09 HISTORY — DX: Chronic obstructive pulmonary disease, unspecified: J44.9

## 2013-06-09 HISTORY — DX: Essential (primary) hypertension: I10

## 2013-06-09 LAB — CBC
MCV: 88.4 fL (ref 78.0–100.0)
Platelets: 269 10*3/uL (ref 150–400)
RBC: 4.99 MIL/uL (ref 4.22–5.81)
WBC: 8.1 10*3/uL (ref 4.0–10.5)

## 2013-06-09 LAB — BASIC METABOLIC PANEL
CO2: 26 mEq/L (ref 19–32)
Calcium: 9.5 mg/dL (ref 8.4–10.5)
Sodium: 139 mEq/L (ref 135–145)

## 2013-06-10 NOTE — Progress Notes (Signed)
Office notified of needing orders.

## 2013-06-10 NOTE — H&P (Signed)
HISTORY AND PHYSICAL  SIRCHARLES HOLZHEIMER is a 57 y.o. male patient with CC: painful teeth  No diagnosis found.  Past Medical History  Diagnosis Date  . Anxiety   . Aneurysm   . Hypertension     high with anxiety  . COPD (chronic obstructive pulmonary disease)     smoker    No current facility-administered medications for this encounter.   Current Outpatient Prescriptions  Medication Sig Dispense Refill  . busPIRone (BUSPAR) 10 MG tablet Take 1 tablet (10 mg total) by mouth 2 (two) times daily.  60 tablet  0  . LORazepam (ATIVAN) 0.5 MG tablet Take 0.5 mg by mouth 2 (two) times daily as needed for anxiety.      Marland Kitchen HYDROcodone-acetaminophen (NORCO/VICODIN) 5-325 MG per tablet Take 2 tablets by mouth every 4 (four) hours as needed.  10 tablet  0   No Known Allergies Active Problems:   * No active hospital problems. *  Vitals: There were no vitals taken for this visit. Lab results:No results found for this or any previous visit (from the past 24 hour(s)). Radiology Results: Dg Chest 2 View  06/09/2013   *RADIOLOGY REPORT*  Clinical Data: Preoperative evaluation for tooth extraction is  CHEST - 2 VIEW  Comparison: 06/04/2009  Findings: The heart and pulmonary vascularity are within normal limits.  The lungs are clear bilaterally.  No acute bony abnormality is seen.  IMPRESSION: No acute abnormality noted.   Original Report Authenticated By: Alcide Clever, M.D.   General appearance: alert, cooperative and no distress Head: Normocephalic, without obvious abnormality, atraumatic Eyes: negative Ears: normal TM's and external ear canals both ears Nose: Nares normal. Septum midline. Mucosa normal. No drainage or sinus tenderness. Throat: Severe dental caries teeth #'s 23, 26, 27, 29, impacted teeth #'s 17, 32, Neck: no adenopathy, supple, symmetrical, trachea midline and thyroid not enlarged, symmetric, no tenderness/mass/nodules Resp: clear to auscultation bilaterally Cardio: regular rate and  rhythm, S1, S2 normal, no murmur, click, rub or gallop  Radiographic: Panorex and CT reveal 4.0 cm by 2.0 cm radiolucecy associated with impacted tooth 3 17.  Assessment:57 YO WM anxiety with nonrestorable teeth #'s 23, 26, 27, 29, impacted teeth #'s 17, and 32. Cyst right mandible.  Plan: Extract teeth #'s 17, 23, 26, 27, 29, 32, remove cyst right mandible, freeze dried bone graft right mandible. General anesthsia. Day surgery.   Georgia Lopes 06/10/2013

## 2013-06-13 MED ORDER — CEFAZOLIN SODIUM-DEXTROSE 2-3 GM-% IV SOLR
2.0000 g | INTRAVENOUS | Status: AC
  Administered 2013-06-14: 2 g via INTRAVENOUS
  Filled 2013-06-13: qty 50

## 2013-06-14 ENCOUNTER — Encounter (HOSPITAL_COMMUNITY): Payer: Self-pay | Admitting: Anesthesiology

## 2013-06-14 ENCOUNTER — Ambulatory Visit (HOSPITAL_COMMUNITY)
Admission: RE | Admit: 2013-06-14 | Discharge: 2013-06-14 | Disposition: A | Discharge status: Home / Self Care | Payer: MEDICAID | Source: Ambulatory Visit | Attending: Oral Surgery | Admitting: Oral Surgery

## 2013-06-14 ENCOUNTER — Ambulatory Visit (HOSPITAL_COMMUNITY): Payer: Self-pay | Admitting: Anesthesiology

## 2013-06-14 ENCOUNTER — Ambulatory Visit (HOSPITAL_COMMUNITY): Payer: Self-pay | Admitting: Psychiatry

## 2013-06-14 ENCOUNTER — Encounter (HOSPITAL_COMMUNITY)
Admission: RE | Disposition: A | Discharge status: Home / Self Care | Payer: Self-pay | Source: Ambulatory Visit | Attending: Oral Surgery

## 2013-06-14 DIAGNOSIS — K029 Dental caries, unspecified: Secondary | ICD-10-CM | POA: Insufficient documentation

## 2013-06-14 DIAGNOSIS — F411 Generalized anxiety disorder: Secondary | ICD-10-CM | POA: Insufficient documentation

## 2013-06-14 DIAGNOSIS — J4489 Other specified chronic obstructive pulmonary disease: Secondary | ICD-10-CM | POA: Insufficient documentation

## 2013-06-14 DIAGNOSIS — Z79899 Other long term (current) drug therapy: Secondary | ICD-10-CM | POA: Insufficient documentation

## 2013-06-14 DIAGNOSIS — J449 Chronic obstructive pulmonary disease, unspecified: Secondary | ICD-10-CM | POA: Insufficient documentation

## 2013-06-14 DIAGNOSIS — I10 Essential (primary) hypertension: Secondary | ICD-10-CM | POA: Insufficient documentation

## 2013-06-14 DIAGNOSIS — I729 Aneurysm of unspecified site: Secondary | ICD-10-CM | POA: Insufficient documentation

## 2013-06-14 DIAGNOSIS — K011 Impacted teeth: Secondary | ICD-10-CM

## 2013-06-14 DIAGNOSIS — F17200 Nicotine dependence, unspecified, uncomplicated: Secondary | ICD-10-CM | POA: Insufficient documentation

## 2013-06-14 DIAGNOSIS — M2749 Other cysts of jaw: Secondary | ICD-10-CM | POA: Insufficient documentation

## 2013-06-14 DIAGNOSIS — K006 Disturbances in tooth eruption: Secondary | ICD-10-CM | POA: Insufficient documentation

## 2013-06-14 DIAGNOSIS — M2740 Unspecified cyst of jaw: Secondary | ICD-10-CM

## 2013-06-14 HISTORY — PX: TOOTH EXTRACTION: SHX859

## 2013-06-14 HISTORY — PX: EAR CYST EXCISION: SHX22

## 2013-06-14 SURGERY — DENTAL RESTORATION/EXTRACTIONS
Anesthesia: General | Site: Mouth | Laterality: Right | Wound class: Clean Contaminated

## 2013-06-14 MED ORDER — ONDANSETRON HCL 4 MG/2ML IJ SOLN
INTRAMUSCULAR | Status: DC | PRN
  Administered 2013-06-14: 4 mg via INTRAVENOUS

## 2013-06-14 MED ORDER — LIDOCAINE-EPINEPHRINE 2 %-1:100000 IJ SOLN
INTRAMUSCULAR | Status: DC | PRN
  Administered 2013-06-14: 10 mL via INTRADERMAL

## 2013-06-14 MED ORDER — LACTATED RINGERS IV SOLN
INTRAVENOUS | Status: DC | PRN
  Administered 2013-06-14: 11:00:00 via INTRAVENOUS

## 2013-06-14 MED ORDER — PROPOFOL 10 MG/ML IV BOLUS
INTRAVENOUS | Status: DC | PRN
  Administered 2013-06-14: 200 mg via INTRAVENOUS

## 2013-06-14 MED ORDER — NEOSTIGMINE METHYLSULFATE 1 MG/ML IJ SOLN
INTRAMUSCULAR | Status: DC | PRN
  Administered 2013-06-14: 3 mg via INTRAVENOUS

## 2013-06-14 MED ORDER — LIDOCAINE-EPINEPHRINE 2 %-1:100000 IJ SOLN
INTRAMUSCULAR | Status: AC
  Filled 2013-06-14: qty 1

## 2013-06-14 MED ORDER — AMOXICILLIN 500 MG PO CAPS: 500.0000 mg | ORAL_CAPSULE | Freq: Four times a day (QID) | ORAL | Status: DC

## 2013-06-14 MED ORDER — OXYMETAZOLINE HCL 0.05 % NA SOLN
1.0000 | NASAL | Status: AC
  Administered 2013-06-14: 1 via NASAL
  Filled 2013-06-14: qty 15

## 2013-06-14 MED ORDER — OXYCODONE HCL 5 MG PO TABS
ORAL_TABLET | ORAL | Status: AC
  Filled 2013-06-14: qty 1

## 2013-06-14 MED ORDER — LACTATED RINGERS IV SOLN
Freq: Once | INTRAVENOUS | Status: AC
  Administered 2013-06-14: 11:00:00 via INTRAVENOUS

## 2013-06-14 MED ORDER — SODIUM CHLORIDE 0.9 % IR SOLN
Status: DC | PRN
  Administered 2013-06-14: 1000 mL

## 2013-06-14 MED ORDER — MIDAZOLAM HCL 5 MG/5ML IJ SOLN
INTRAMUSCULAR | Status: DC | PRN
  Administered 2013-06-14: 2 mg via INTRAVENOUS

## 2013-06-14 MED ORDER — OXYMETAZOLINE HCL 0.05 % NA SOLN
NASAL | Status: DC | PRN
  Administered 2013-06-14: 1 via NASAL

## 2013-06-14 MED ORDER — LIDOCAINE HCL (CARDIAC) 20 MG/ML IV SOLN
INTRAVENOUS | Status: DC | PRN
  Administered 2013-06-14: 100 mg via INTRAVENOUS

## 2013-06-14 MED ORDER — GLYCOPYRROLATE 0.2 MG/ML IJ SOLN
INTRAMUSCULAR | Status: DC | PRN
  Administered 2013-06-14: .6 mg via INTRAVENOUS

## 2013-06-14 MED ORDER — ROCURONIUM BROMIDE 100 MG/10ML IV SOLN
INTRAVENOUS | Status: DC | PRN
  Administered 2013-06-14: 30 mg via INTRAVENOUS

## 2013-06-14 MED ORDER — OXYCODONE-ACETAMINOPHEN 10-325 MG PO TABS: 1.0000 | ORAL_TABLET | ORAL | Status: DC | PRN

## 2013-06-14 MED ORDER — FENTANYL CITRATE 0.05 MG/ML IJ SOLN
INTRAMUSCULAR | Status: DC | PRN
  Administered 2013-06-14: 150 ug via INTRAVENOUS

## 2013-06-14 MED ORDER — OXYCODONE HCL 5 MG/5ML PO SOLN: 5.0000 mg | Freq: Once | ORAL | Status: AC | PRN

## 2013-06-14 MED ORDER — OXYCODONE HCL 5 MG PO TABS
5.0000 mg | ORAL_TABLET | Freq: Once | ORAL | Status: AC | PRN
  Administered 2013-06-14: 5 mg via ORAL

## 2013-06-14 MED ORDER — HYDROMORPHONE HCL PF 1 MG/ML IJ SOLN
INTRAMUSCULAR | Status: AC
  Administered 2013-06-14: 0.5 mg via INTRAVENOUS
  Filled 2013-06-14: qty 1

## 2013-06-14 MED ORDER — HYDROMORPHONE HCL PF 1 MG/ML IJ SOLN
0.2500 mg | INTRAMUSCULAR | Status: DC | PRN
  Administered 2013-06-14: 0.5 mg via INTRAVENOUS

## 2013-06-14 SURGICAL SUPPLY — 43 items
BLADE SURG 15 STRL LF DISP TIS (BLADE) ×2 IMPLANT
BLADE SURG 15 STRL SS (BLADE) ×3
BUR CROSS CUT (BURR) ×3
BUR CROSS CUT FISSURE 1.6 (BURR) IMPLANT
BUR EGG ELITE 4.0 (BURR) ×1 IMPLANT
BUR SRG MED 1.6XXCUT FSSR (BURR) IMPLANT
BUR SRG MED 2.1XXCUT FSSR (BURR) IMPLANT
BUR SURG 4X8 MED (BURR) IMPLANT
BURR SRG MED 1.6XXCUT FSSR (BURR) ×2
BURR SRG MED 2.1XXCUT FSSR (BURR)
BURR SURG 4X8 MED (BURR)
CANISTER SUCTION 2500CC (MISCELLANEOUS) ×3 IMPLANT
CLOTH BEACON ORANGE TIMEOUT ST (SAFETY) ×3 IMPLANT
CONT SPEC 4OZ CLIKSEAL STRL BL (MISCELLANEOUS) ×1 IMPLANT
COVER SURGICAL LIGHT HANDLE (MISCELLANEOUS) ×3 IMPLANT
DECANTER SPIKE VIAL GLASS SM (MISCELLANEOUS) IMPLANT
GAUZE PACKING FOLDED 2  STR (GAUZE/BANDAGES/DRESSINGS) ×1
GAUZE PACKING FOLDED 2 STR (GAUZE/BANDAGES/DRESSINGS) ×2 IMPLANT
GAUZE SPONGE 4X4 16PLY XRAY LF (GAUZE/BANDAGES/DRESSINGS) ×3 IMPLANT
GLOVE BIO SURGEON STRL SZ 6.5 (GLOVE) ×9 IMPLANT
GLOVE BIO SURGEON STRL SZ7 (GLOVE) IMPLANT
GLOVE BIO SURGEON STRL SZ7.5 (GLOVE) ×6 IMPLANT
GLOVE BIOGEL PI IND STRL 7.0 (GLOVE) ×2 IMPLANT
GLOVE BIOGEL PI INDICATOR 7.0 (GLOVE) ×1
GOWN STRL NON-REIN LRG LVL3 (GOWN DISPOSABLE) ×6 IMPLANT
GOWN STRL REIN XL XLG (GOWN DISPOSABLE) ×3 IMPLANT
KIT BASIN OR (CUSTOM PROCEDURE TRAY) ×3 IMPLANT
KIT ROOM TURNOVER OR (KITS) ×3 IMPLANT
NDL BLUNT 16X1.5 OR ONLY (NEEDLE) IMPLANT
NEEDLE 22X1 1/2 (OR ONLY) (NEEDLE) ×3 IMPLANT
NEEDLE BLUNT 16X1.5 OR ONLY (NEEDLE) IMPLANT
NS IRRIG 1000ML POUR BTL (IV SOLUTION) ×3 IMPLANT
PAD ARMBOARD 7.5X6 YLW CONV (MISCELLANEOUS) ×6 IMPLANT
PUTTY DBX 5CC (Putty) ×1 IMPLANT
SUT CHROMIC 3 0 PS 2 (SUTURE) ×6 IMPLANT
SYR 50ML SLIP (SYRINGE) IMPLANT
TOWEL OR 17X24 6PK STRL BLUE (TOWEL DISPOSABLE) ×3 IMPLANT
TOWEL OR 17X26 10 PK STRL BLUE (TOWEL DISPOSABLE) ×3 IMPLANT
TRAY ENT MC OR (CUSTOM PROCEDURE TRAY) ×3 IMPLANT
TUBE CONNECTING 12X1/4 (SUCTIONS) ×3 IMPLANT
TUBING IRRIGATION (MISCELLANEOUS) ×1 IMPLANT
WATER STERILE IRR 1000ML POUR (IV SOLUTION) ×3 IMPLANT
YANKAUER SUCT BULB TIP NO VENT (SUCTIONS) ×3 IMPLANT

## 2013-06-14 NOTE — H&P (Signed)
H&P documentation  -History and Physical Reviewed  -Patient has been re-examined  -No change in the plan of care  Anthony Coffey  

## 2013-06-14 NOTE — Transfer of Care (Signed)
Immediate Anesthesia Transfer of Care Note  Patient: Anthony Coffey  Procedure(s) Performed: Procedure(s): EXTRACTIONS 17, 23, 26, 27, 29, 32 (N/A) CYST REMOVAL RIGHT MANDIBLE (Right)  Patient Location: PACU  Anesthesia Type:General  Level of Consciousness: awake, alert  and oriented  Airway & Oxygen Therapy: Patient Spontanous Breathing and Patient connected to nasal cannula oxygen  Post-op Assessment: Report given to PACU RN, Post -op Vital signs reviewed and stable and Patient moving all extremities X 4  Post vital signs: Reviewed and stable  Complications: No apparent anesthesia complications

## 2013-06-14 NOTE — Anesthesia Preprocedure Evaluation (Addendum)
Anesthesia Evaluation  Patient identified by MRN, date of birth, ID band Patient awake    Reviewed: Allergy & Precautions, H&P , NPO status , Patient's Chart, lab work & pertinent test results  Airway Mallampati: II TM Distance: >3 FB Neck ROM: Full    Dental no notable dental hx. (+) Poor Dentition and Dental Advisory Given   Pulmonary COPD breath sounds clear to auscultation  Pulmonary exam normal       Cardiovascular hypertension, Rhythm:Regular Rate:Normal     Neuro/Psych negative neurological ROS  negative psych ROS   GI/Hepatic negative GI ROS, Neg liver ROS,   Endo/Other  negative endocrine ROS  Renal/GU negative Renal ROS  negative genitourinary   Musculoskeletal   Abdominal   Peds  Hematology negative hematology ROS (+)   Anesthesia Other Findings   Reproductive/Obstetrics negative OB ROS                           Anesthesia Physical Anesthesia Plan  ASA: II  Anesthesia Plan: General   Post-op Pain Management:    Induction: Intravenous  Airway Management Planned: Nasal ETT  Additional Equipment:   Intra-op Plan:   Post-operative Plan: Extubation in OR  Informed Consent: I have reviewed the patients History and Physical, chart, labs and discussed the procedure including the risks, benefits and alternatives for the proposed anesthesia with the patient or authorized representative who has indicated his/her understanding and acceptance.   Dental advisory given and Dental Advisory Given  Plan Discussed with: CRNA and Anesthesiologist  Anesthesia Plan Comments:        Anesthesia Quick Evaluation

## 2013-06-14 NOTE — Anesthesia Postprocedure Evaluation (Signed)
  Anesthesia Post-op Note  Patient: Anthony Coffey  Procedure(s) Performed: Procedure(s): EXTRACTIONS 17, 23, 26, 27, 29, 32 (N/A) CYST REMOVAL RIGHT MANDIBLE (Right)  Patient Location: PACU  Anesthesia Type:General  Level of Consciousness: awake, alert  and oriented  Airway and Oxygen Therapy: Patient Spontanous Breathing  Post-op Pain: mild  Post-op Assessment: Post-op Vital signs reviewed, Patient's Cardiovascular Status Stable, Respiratory Function Stable, Patent Airway and No signs of Nausea or vomiting  Post-op Vital Signs: Reviewed and stable  Complications: No apparent anesthesia complications

## 2013-06-14 NOTE — Preoperative (Signed)
Beta Blockers   Reason not to administer Beta Blockers:Not Applicable 

## 2013-06-14 NOTE — Op Note (Signed)
[place]                 ACCOUNT NO.:  [id number]  MEDICAL RECORD NO.:  [id number]  LOCATION:  [facility]                         FACILITY:  [facility]  PHYSICIAN:  Georgia Lopes, M.D.  DATE OF BIRTH:  November 25, 1955  DATE OF PROCEDURE:  06/14/2013 DATE OF DISCHARGE:                              OPERATIVE REPORT   PREOPERATIVE DIAGNOSES:  Nonrestorable teeth #23, #26, #27, #29. Impacted teeth #17, #32.  Cyst in right mandible.  POSTOPERATIVE DIAGNOSIS:  Nonrestorable teeth #23, #26, #27, #29. Impacted teeth #17, #32.  Cyst in right mandible.  PROCEDURE:  Extraction of teeth #17, #23, #26, #27, #29, #32.  Cyst removal right mandible.  Bone graft right mandible.  Alveoplasty right mandible.  SURGEON:  Georgia Lopes, MD.  ANESTHESIA:  General, nasal intubation.  INDICATIONS FOR PROCEDURE:  This patient is a 57 year old male who is referred to me by his general dentist for removal of all remaining lower teeth.  Teeth #17 and #32, were completely impacted in the bone and there was a large cystic lesion associated with tooth #17, approximately 3.5 cm x 2.0 cm, because of the anticipated difficulty of the case, it was recommended that general anesthesia be utilized with intubation for airway protection.  PROCEDURE NOTE:  The patient was taken to the operating room, placed on the table in supine position.  General anesthesia was administered intravenously and nasal endotracheal tube was placed and secured.  The eyes were protected.  The patient was draped for the procedure.  A time- out was performed.  The posterior pharynx was suctioned and a throat pack was placed.  Lidocaine 2% of 1:100,000 epinephrine was infiltrated in the inferior alveolar block on the right and left side and buccal infiltration anterior mandible, a total of 10 mL was utilized, a sweetheart retractor was placed on the right side of the mouth and a bite block was placed there as well.  A 15-blade was used to make  a full- thickness incision overlying tooth #17.  The periosteum was reflected with a periosteal elevator.  Bone was removed with a Striker handpiece, the tooth was sectioned, the tooth was removed with a 301-elevator and the rongeur, and the socket was curetted, irrigated, and closed with 3-0 chromic.  A 15-blade was then used to make an incision beginning at tooth #23 carrying anteriorly around teeth #26, #27, #29.  The periosteum was reflected around these teeth.  The teeth were removed with the dental rongeur, sockets were curetted.  The periosteum was further reflected and alveoplasty was performed.  Then, a 15-blade was used to make a full-thickness incision overlying tooth #32 connecting of the previously placed incision on the right mandible, the dissection was carried down to the bone with a periosteal elevator, then bone was removed with a Striker handpiece under irrigation until the cystic lesion was identified.  Bone was removed through the anterior and lateral and posterior margin of the cyst as well as the medial margin of the cyst was fully exposed.  Then, dental curette were used to gently elevate the cyst from the crown of the tooth.  The inferior alveolar nerve was identified in the anterior portion of  the bony crypt.  The cyst was removed and submitted for pathological examination.  Then, tooth #32 was sectioned into multiple pieces and removed using a 301- elevator and rongeur.  The socket was then further curetted.  It was irrigated with normal saline.  Bone putty was placed to restore contour and then the right mandible was closed with 3-0 chromic continuous interlocking suture.  The floor of the mouth was found to have a small laceration of the lingual tissues of the mandibular ridge at #32, 3-0 chromic sutures were placed for primary closure in this region as well. Then, the oral cavity was irrigated, suctioned, throat pack was removed. The patient was awakened,  taken to the recovery, breathing spontaneously in good condition.  EBL minimum.  Complications none.  Specimen actually is cyst of right mandible.     Georgia Lopes, M.D.     SMJ/MEDQ  D:  06/14/2013  T:  06/14/2013  Job:  [id number]

## 2013-06-14 NOTE — Anesthesia Procedure Notes (Addendum)
Procedure Name: Intubation Date/Time: 06/14/2013 12:15 PM Performed by: Carmela Rima Pre-anesthesia Checklist: Patient identified, Timeout performed, Emergency Drugs available, Suction available and Patient being monitored Patient Re-evaluated:Patient Re-evaluated prior to inductionOxygen Delivery Method: Circle system utilized Preoxygenation: Pre-oxygenation with 100% oxygen Intubation Type: IV induction Ventilation: Mask ventilation without difficulty Laryngoscope Size: Mac and 3 Grade View: Grade II Nasal Tubes: Right, Nasal Rae, Magill forceps- large, utilized and Nasal prep performed Tube size: 7.0 mm Number of attempts: 3 Airway Equipment and Method: Video-laryngoscopy Placement Confirmation: ETT inserted through vocal cords under direct vision,  positive ETCO2 and breath sounds checked- equal and bilateral Tube secured with: Tape Dental Injury: Teeth and Oropharynx as per pre-operative assessment and Bloody posterior oropharynx

## 2013-06-14 NOTE — Op Note (Signed)
06/14/2013  12:58 PM  PATIENT:  Anthony Coffey  57 y.o. male  PRE-OPERATIVE DIAGNOSIS:  NON RESTORABLE TEETH #'s 23, 26, 27, 29, Impacted teeth #'s 17, 32, CYST RIGHT MANDIBLE  POST-OPERATIVE DIAGNOSIS:  SAME  PROCEDURE:  Procedure(s): EXTRACTIONS 17, 23, 26, 27, 29, 32 CYST REMOVAL RIGHT MANDIBLE, BONE GRAFT RIGHT MANDIBLE. ALVEOLOPLASTY RIGHT MANDIBLE  SURGEON:  Surgeon(s): Georgia Lopes, DDS  ANESTHESIA:   local and general  EBL:  minimal  DRAINS: none   SPECIMEN:    COUNTS:  YES  PLAN OF CARE: Discharge to home after PACU  PATIENT DISPOSITION:  PACU - hemodynamically stable.   PROCEDURE DETAILS: Dictation # [id number]  Georgia Lopes, DMD 06/14/2013 12:58 PM

## 2013-06-16 ENCOUNTER — Encounter (HOSPITAL_COMMUNITY): Payer: Self-pay | Admitting: Oral Surgery

## 2013-06-22 ENCOUNTER — Ambulatory Visit (INDEPENDENT_AMBULATORY_CARE_PROVIDER_SITE_OTHER): Payer: Self-pay | Admitting: Psychiatry

## 2013-06-22 ENCOUNTER — Encounter (HOSPITAL_COMMUNITY): Payer: Self-pay | Admitting: Psychiatry

## 2013-06-22 VITALS — BP 120/80 | HR 78 | Ht 67.95 in | Wt 166.0 lb

## 2013-06-22 DIAGNOSIS — F411 Generalized anxiety disorder: Secondary | ICD-10-CM

## 2013-06-22 DIAGNOSIS — F064 Anxiety disorder due to known physiological condition: Secondary | ICD-10-CM

## 2013-06-22 MED ORDER — LORAZEPAM 0.5 MG PO TABS: 0.5000 mg | ORAL_TABLET | Freq: Every day | ORAL | Status: DC | PRN

## 2013-06-22 MED ORDER — BUSPIRONE HCL 10 MG PO TABS: 10.0000 mg | ORAL_TABLET | Freq: Two times a day (BID) | ORAL | Status: AC

## 2013-06-22 NOTE — Progress Notes (Signed)
[facility] [address] Progress Note  Anthony Coffey [id number] 57 y.o.  06/22/2013 3:45 PM  Chief Complaint: I am out of my medication.  I started to have again anxiety and poor sleep.    History of Present Illness: Patient is 57 year old Caucasian separated man who came for his followup appointment.  His been out of his medication for past one week.  He started experiencing again nervousness and anxiety symptoms.  Patient did not kept his appointment.  He has tooth ache and recently seen dentist and acquire a procedure.  He is given pain medication and antibiotic.  His pain is much improved.  Patient has an education anger mood swings but admitted panic attack and nervousness.  His son is visiting from [place] .  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  Patient continues to stay with the mother.  He has not find a job.  He worries about his future .  However when he was taking BuSpar he was feeling much better.  He do not recall any side effects of BuSpar.  He was given Ativan 0.5 mg twice a day but he was eating only one a day far as needed basis.  Patient endorsed due to anxiety he is unable to focus in his own life.  He is trying to find a job but he has difficulty find one.  He denied any drinking or using any illegal substance.  He has a blood work recently which is normal.     Suicidal Ideation: No Plan Formed: No Patient has means to carry out plan: No  Homicidal Ideation: No Plan Formed: No Patient has means to carry out plan: No  Review of Systems: Psychiatric: Agitation: No Hallucination: No Depressed Mood: Yes Insomnia: Yes Hypersomnia: No Altered Concentration: No Feels Worthless: No Grandiose Ideas: No Belief In Special Powers: No New/Increased Substance Abuse: No Compulsions: No  Neurologic: Headache: No Seizure: No Paresthesias: No  Past Medical Family, Social History: Patient has history of abdominal aneurysm and requires emergency surgery in 03-22-09.  He has no  current primary care physician.  He is born and raised in [place].  He married in 03-22-1982 however his marriage last for only 8 months. He has one son who served in Electronics engineer. Patient live with her mother. His father died in Mar 22, 2008 due to heart attack. Patient has some close friends who he socialize very often. Patient denies a history of sexual, physical or emotional abuse.  Alcohol and substance use history. Patient has history of alcohol use in the past.  He claims to be sober in recent years.  He denies any history of intoxication, blackouts, seizures, tremors or any withdrawal symptoms.  Denies any illegal substance use.  Outpatient Encounter Prescriptions as of 06/22/2013  Medication Sig Dispense Refill  . amoxicillin (AMOXIL) 500 MG capsule Take 1 capsule (500 mg total) by mouth 4 (four) times daily.  28 capsule  0  . busPIRone (BUSPAR) 10 MG tablet Take 1 tablet (10 mg total) by mouth 2 (two) times daily.  60 tablet  1  . LORazepam (ATIVAN) 0.5 MG tablet Take 1 tablet (0.5 mg total) by mouth daily as needed for anxiety.  30 tablet  1  . oxyCODONE-acetaminophen (PERCOCET) 10-325 MG per tablet Take 1-2 tablets by mouth every 4 (four) hours as needed for pain.  40 tablet  0  . [DISCONTINUED] busPIRone (BUSPAR) 10 MG tablet Take 1 tablet (10 mg total) by mouth 2 (two) times daily.  60 tablet  0  . [  DISCONTINUED] LORazepam (ATIVAN) 0.5 MG tablet Take 0.5 mg by mouth 2 (two) times daily as needed for anxiety.       No facility-administered encounter medications on file as of 06/22/2013.    Past Psychiatric History/Hospitalization(s): Anxiety: Yes Bipolar Disorder: No Depression: No Mania: No Psychosis: No Schizophrenia: No Personality Disorder: No Hospitalization for psychiatric illness: No History of Electroconvulsive Shock Therapy: No Prior Suicide Attempts: No  Physical Exam: Constitutional:  BP 120/80  Pulse 78  Ht 5' 7.95" (1.726 m)  Wt 166 lb (75.297 kg)  BMI 25.28 kg/m2  Recent  Results (from the past 2160 hour(s))  BASIC METABOLIC PANEL     Status: None   Collection Time    06/09/13  9:18 AM      Result Value Range   Sodium 139  135 - 145 mEq/L   Potassium 4.6  3.5 - 5.1 mEq/L   Chloride 105  96 - 112 mEq/L   CO2 26  19 - 32 mEq/L   Glucose, Bld 94  70 - 99 mg/dL   BUN 9  6 - 23 mg/dL   Creatinine, Ser 0.98  0.50 - 1.35 mg/dL   Calcium 9.5  8.4 - 11.9 mg/dL   GFR calc non Af Amer >90  >90 mL/min   GFR calc Af Amer >90  >90 mL/min   Comment:            The eGFR has been calculated     using the CKD EPI equation.     This calculation has not been     validated in all clinical     situations.     eGFR's persistently     <90 mL/min signify     possible Chronic Kidney Disease.  CBC     Status: None   Collection Time    06/09/13  9:18 AM      Result Value Range   WBC 8.1  4.0 - 10.5 K/uL   RBC 4.99  4.22 - 5.81 MIL/uL   Hemoglobin 15.1  13.0 - 17.0 g/dL   HCT 14.7  82.9 - 56.2 %   MCV 88.4  78.0 - 100.0 fL   MCH 30.3  26.0 - 34.0 pg   MCHC 34.2  30.0 - 36.0 g/dL   RDW 13.0  86.5 - 78.4 %   Platelets 269  150 - 400 K/uL   General Appearance: alert, oriented, no acute distress and well nourished  Musculoskeletal: Strength & Muscle Tone: within normal limits Gait & Station: normal Patient leans: N/A  Psychiatric: Speech (describe rate, volume, coherence, spontaneity, and abnormalities if any): Clear and coherent.  Thought Process (describe rate, content, abstract reasoning, and computation): Logical and goal-directed  Associations: Intact  Thoughts: normal  Mental Status: Orientation: oriented to person, place, time/date, month of year and year Mood & Affect: anxiety Attention Span & Concentration: Fair  Medical Decision Making (Choose Three): Review of Psycho-Social Stressors (1), Review or order clinical lab tests (1), Review and summation of old records (2), Established Problem, Worsening (2), Review of Last Therapy Session (1), Review  of Medication Regimen & Side Effects (2) and Review of New Medication or Change in Dosage (2)  Assessment: Axis I: Generalized anxiety disorder, anxiety disorder due to general medical condition  Axis II: Deferred  Axis III: History of abdominal aortic aneurysm , status post emergency surgery  Axis IV: Moderate  Axis V: 50-55   Plan: I reviewed his chart, including blurred results and current medication.  He has few more antibiotic and pain medication.  I will start BuSpar 10 mg twice a day and Ativan 0.5 mg when necessary as needed.  Discussed in detail the risks and benefits of medication and compliance with medication and followup.  Patient does not want any counseling at this time.  Patient does not recall any side effects of BuSpar.  Discussed about benzodiazepine dependence, withdrawal intolerance.  Recommend to use only as needed until may develop tolerance and dependence.  Patient agree with the plan.  Followup in 2 months . Time spent 25 minutes.  More than 50% of the time spent in psychoeducation, counseling and coordination of care.  Discuss safety plan that anytime having active suicidal thoughts or homicidal thoughts then patient need to call 911 or go to the local emergency room.  Sirr Kabel T., MD 06/22/2013

## 2013-08-04 ENCOUNTER — Emergency Department (HOSPITAL_BASED_OUTPATIENT_CLINIC_OR_DEPARTMENT_OTHER)
Admission: EM | Admit: 2013-08-04 | Discharge: 2013-08-04 | Disposition: A | Discharge status: Home / Self Care | Payer: Self-pay | Attending: Emergency Medicine | Admitting: Emergency Medicine

## 2013-08-04 ENCOUNTER — Encounter (HOSPITAL_BASED_OUTPATIENT_CLINIC_OR_DEPARTMENT_OTHER): Payer: Self-pay | Admitting: Emergency Medicine

## 2013-08-04 DIAGNOSIS — Z792 Long term (current) use of antibiotics: Secondary | ICD-10-CM | POA: Insufficient documentation

## 2013-08-04 DIAGNOSIS — J4489 Other specified chronic obstructive pulmonary disease: Secondary | ICD-10-CM | POA: Insufficient documentation

## 2013-08-04 DIAGNOSIS — F411 Generalized anxiety disorder: Secondary | ICD-10-CM | POA: Insufficient documentation

## 2013-08-04 DIAGNOSIS — F17200 Nicotine dependence, unspecified, uncomplicated: Secondary | ICD-10-CM | POA: Insufficient documentation

## 2013-08-04 DIAGNOSIS — Z79899 Other long term (current) drug therapy: Secondary | ICD-10-CM | POA: Insufficient documentation

## 2013-08-04 DIAGNOSIS — F1120 Opioid dependence, uncomplicated: Secondary | ICD-10-CM | POA: Insufficient documentation

## 2013-08-04 DIAGNOSIS — I10 Essential (primary) hypertension: Secondary | ICD-10-CM | POA: Insufficient documentation

## 2013-08-04 DIAGNOSIS — J449 Chronic obstructive pulmonary disease, unspecified: Secondary | ICD-10-CM | POA: Insufficient documentation

## 2013-08-04 LAB — CBC WITH DIFFERENTIAL/PLATELET
Basophils Relative: 0 % (ref 0–1)
Eosinophils Absolute: 0.3 10*3/uL (ref 0.0–0.7)
Eosinophils Relative: 4 % (ref 0–5)
Hemoglobin: 15.6 g/dL (ref 13.0–17.0)
Lymphs Abs: 2.1 10*3/uL (ref 0.7–4.0)
MCH: 31.1 pg (ref 26.0–34.0)
MCHC: 34.5 g/dL (ref 30.0–36.0)
MCV: 90.2 fL (ref 78.0–100.0)
Monocytes Absolute: 0.5 10*3/uL (ref 0.1–1.0)
Monocytes Relative: 5 % (ref 3–12)
RBC: 5.01 MIL/uL (ref 4.22–5.81)

## 2013-08-04 LAB — BASIC METABOLIC PANEL
BUN: 6 mg/dL (ref 6–23)
Calcium: 10 mg/dL (ref 8.4–10.5)
Creatinine, Ser: 0.7 mg/dL (ref 0.50–1.35)
GFR calc Af Amer: >90 mL/min (ref >90)
GFR calc non Af Amer: >90 mL/min (ref >90)
Glucose, Bld: 97 mg/dL (ref 70–99)

## 2013-08-04 LAB — RAPID URINE DRUG SCREEN, HOSP PERFORMED
Barbiturates: NOT DETECTED
Cocaine: NOT DETECTED
Tetrahydrocannabinol: NOT DETECTED

## 2013-08-04 MED ORDER — CLONIDINE HCL 0.2 MG PO TABS: 0.1000 mg | ORAL_TABLET | Freq: Four times a day (QID) | ORAL | Status: DC | PRN

## 2013-08-04 NOTE — BH Assessment (Signed)
TC to EDP Delo. Told him that pt can be d/c with outpatient resources as pt doesn't meet inpatient criteria and [facility] no longer . EDP in agreement. Writer spoke with RN Lawson Fiscal and Clinical research associate will Air cabin crew to [place]. Writer entering note into EPIC currently.  Evette Cristal, [place] Assessment Counselor

## 2013-08-04 NOTE — ED Provider Notes (Signed)
CSN: [id number]     Arrival date & time 08/04/13  1041 History   First MD Initiated Contact with Patient 08/04/13 1144     Chief Complaint  Patient presents with  . wants help getting off pain meds    (Consider location/radiation/quality/duration/timing/severity/associated sxs/prior Treatment) HPI Comments: Patient is a 57 year old male presents requesting help with opiate addiction. He tells me that he has been on pain medications for the past 4 years and wants to stop. He started taking them after surgery he had in 2010 has not stopped taking them since. He states that occasionally he gets these as prescriptions and occasionally gets them from acquaintances. He tells me that when he stops he feels real bad and developed diarrhea and nausea. He has no prior attempts at rehabilitation and he denies any other drug or alcohol use.  The history is provided by the patient.    Past Medical History  Diagnosis Date  . Anxiety   . Aneurysm   . Hypertension     high with anxiety  . COPD (chronic obstructive pulmonary disease)     smoker   Past Surgical History  Procedure Laterality Date  . Abdominal aortic aneurysm repair    . Tonsillectomy    . Tooth extraction N/A 06/14/2013    Procedure: EXTRACTIONS 17, 23, 26, 27, 29, 32;  Surgeon: Georgia Lopes, DDS;  Location: [facility] OR;  Service: Oral Surgery;  Laterality: N/A;  . Ear cyst excision Right 06/14/2013    Procedure: CYST REMOVAL RIGHT MANDIBLE;  Surgeon: Georgia Lopes, DDS;  Location: [facility] OR;  Service: Oral Surgery;  Laterality: Right;   Family History  Problem Relation Age of Onset  . Anxiety disorder Brother    History  Substance Use Topics  . Smoking status: Current Every Day Smoker -- 1.00 packs/day for 39 years    Types: Cigarettes  . Smokeless tobacco: Former Neurosurgeon  . Alcohol Use: No    Review of Systems  All other systems reviewed and are negative.    Allergies  Review of patient's allergies indicates no known  allergies.  Home Medications   Current Outpatient Rx  Name  Route  Sig  Dispense  Refill  . amoxicillin (AMOXIL) 500 MG capsule   Oral   Take 1 capsule (500 mg total) by mouth 4 (four) times daily.   28 capsule   0   . busPIRone (BUSPAR) 10 MG tablet   Oral   Take 1 tablet (10 mg total) by mouth 2 (two) times daily.   60 tablet   1   . LORazepam (ATIVAN) 0.5 MG tablet   Oral   Take 1 tablet (0.5 mg total) by mouth daily as needed for anxiety.   30 tablet   1   . oxyCODONE-acetaminophen (PERCOCET) 10-325 MG per tablet   Oral   Take 1-2 tablets by mouth every 4 (four) hours as needed for pain.   40 tablet   0    BP 145/86  Pulse 64  Temp(Src) 98.9 F (37.2 C) (Oral)  Resp 16  Ht 5\' 9"  (1.753 m)  Wt 165 lb (74.844 kg)  BMI 24.36 kg/m2  SpO2 99% Physical Exam  Nursing note and vitals reviewed. Constitutional: He is oriented to person, place, and time. He appears well-developed and well-nourished. No distress.  HENT:  Head: Normocephalic and atraumatic.  Eyes: EOM are normal. Pupils are equal, round, and reactive to light.  Neck: Normal range of motion. Neck supple.  Cardiovascular: Normal  rate, regular rhythm and normal heart sounds.   No murmur heard. Pulmonary/Chest: Effort normal and breath sounds normal. No respiratory distress. He has no wheezes.  Abdominal: Soft. Bowel sounds are normal. He exhibits no distension. There is no tenderness.  Musculoskeletal: Normal range of motion. He exhibits no edema.  Neurological: He is alert and oriented to person, place, and time.  Skin: Skin is warm and dry. He is not diaphoretic.    ED Course  Procedures (including critical care time) Labs Review Labs Reviewed  CCBB MATERNAL DONOR DRAW  CBC WITH DIFFERENTIAL  BASIC METABOLIC PANEL  ETHANOL  URINE RAPID DRUG SCREEN (HOSP PERFORMED)   Imaging Review No results found.  MDM  No diagnosis found. Patient was evaluated by behavioral health by telepsych. They do  not feel as though he is in need of inpatient treatment. He will be given information for outpatient therapeutic options. I will also prescribe a course of clonidine to help with his narcotic withdrawals. He is return to the ER if he develops any further problems.    Geoffery Lyons, MD 08/04/13 [phone]

## 2013-08-04 NOTE — BH Assessment (Signed)
Tele Assessment Note   Anthony Coffey is an 57 y.o. male. Pt presents voluntarily with request for opiate detox. Pt sts he uses approx. 6 to 8 Percocet 10 mg daily. Pt used 2 Percocet 10 mg this am prior to arrival. Pt denies SI and HI. He denies [facility] and no delusions noted. Pt has no hx of seizures. He has never been in [employer]. Pt says his son is in Electronics engineer. Pt sts he has been using pain pills for "two or three years" with longest amount of clean time being 3 days since then. Pt sts he has never tried detox or treatment before. Upon questioning, pt sts he is unsure about detox but definitely wants some meds to help his withdrawal symptoms which haven't begun yet. Pt sts he is on disability and lives w/ his mother. He endorses moderate anxiety. Per chart review, pt had seen Dr. Lolly Mustache on outpatient basis in Aug for anxiety. Pt sts he isn't currently taking psych meds. Pt has no hx of inpatient treatment. Writer spoke with EDP Delo and EDP in agreement that pt can be d/c. Writer provided resources for pt faxed to pt's RN at [employer] -   [facility]  Intensive Outpatient Programs [facility]    [facility] [address]     [address] [place],  [place]     [place], [place] [phone]      [phone] Both a day and evening program  [facility]  [facility] [address]     [address] [place], [place] [address] [phone]      [phone]  [facility]: [facility]    [facility] - Intensive Outpatient [address]     [address] [place], [place] [address]     [place], [place]  [phone]       [phone]  Residential Treatment Programs ASAP Residential Treatment    [facility] ([facility] [address]     [address] [place], [place] [phone]      [phone] or  [phone]  [facility]     The [address] (Several in [place]) [address], [place] [address]    [address] [place] [address]     [place], [place] [phone]      [phone]  [facility]   [facility] Services ([facility]) [address]     [address] [place], [place] [address]     [place], [place] [phone]      [phone] Admissions: 8am-3pm M-F  Self-Help/Support Groups [facility] of    Narc[facility] (NA) Variety of support groups    [facility] [phone] (call for more info)    [address]        [place] - 2 meetings at this location  These referrals have been provided to you as appropriate for your clinical needs while taking into account your financial concerns. Please be aware that agencies, practitioners and insurance companies sometimes change contracts. When calling to make an appointment have your insurance information available so the professional you are going to see can confirm whether they are covered by your plan. Take this form with you in case the person you are seeing needs a copy  or to contact us.  __________________________________________ Assessment Counselor  Axis I: Opioid Dependence            Generalized Anxiety Disorder Axis II: Deferred Axis III:  Past Medical History  Diagnosis Date  . Anxiety   . Aneurysm   . Hypertension     high with anxiety  . COPD (chronic obstructive pulmonary disease)     smoker   Axis IV: economic problems, other psychosocial or environmental problems and problems related to social environment Axis V: 51-60 moderate symptoms  Past Medical History:  Past Medical History  Diagnosis Date  . Anxiety   . Aneurysm   . Hypertension     high with anxiety  . COPD (chronic obstructive pulmonary disease)     smoker    Past Surgical History  Procedure Laterality Date  . Abdominal aortic aneurysm repair    . Tonsillectomy     . Tooth extraction N/A 06/14/2013    Procedure: EXTRACTIONS 17, 23, 26, 27, 29, 32;  Surgeon: Georgia Lopes, DDS;  Location: [facility] OR;  Service: Oral Surgery;  Laterality: N/A;  . Ear cyst excision Right 06/14/2013    Procedure: CYST REMOVAL RIGHT MANDIBLE;  Surgeon: Georgia Lopes, DDS;  Location: [facility] OR;  Service: Oral Surgery;  Laterality: Right;    Family History:  Family History  Problem Relation Age of Onset  . Anxiety disorder Brother     Social History:  reports that he has been smoking Cigarettes.  He has a 39 pack-year smoking history. He has quit using smokeless tobacco. He reports that he does not drink alcohol or use illicit drugs.  Additional Social History:  Alcohol / Drug Use Pain Medications: see PTA meds list - pt abuses pain pills Prescriptions: see PTA meds list Over the Counter: see PTA meds list History of alcohol / drug use?: Yes Longest period of sobriety (when/how long): 3 days in past 3 yrs Substance #1 Name of Substance 1: pain pills 1 - Age of First Use: 54 1 - Amount (size/oz): 6 to 8 Percocet 10 mg 1 - Frequency: daily 1 - Duration: 3 yrs 1 - Last Use / Amount: 08/04/13 - 2 Percocet 2 mg  CIWA: CIWA-Ar BP: 142/80 mmHg Pulse Rate: 68 COWS: Clinical Opiate Withdrawal Scale (COWS) Resting Pulse Rate: Pulse Rate 80 or below Sweating: Subjective report of chills or flushing Restlessness: Able to sit still Pupil Size: Pupils pinned or normal size for room light Bone or Joint Aches: Not present Runny Nose or Tearing: Not present GI Upset: nausea or loose stool Tremor: Tremor can be felt, but not observed Yawning: No yawning Anxiety or Irritability: None Gooseflesh Skin: Skin is smooth COWS Total Score: 4  Allergies: No Known Allergies  Home Medications:  (Not in a hospital admission)  OB/GYN Status:  No LMP for male patient.  General Assessment Data Location of Assessment:  ([facility]) Is this a Tele or Face-to-Face Assessment?:  Tele Assessment Is this an Initial Assessment or a Re-assessment for this encounter?: Initial Assessment Living Arrangements: Parent Can pt return to current living arrangement?: Yes Is patient capable of signing voluntary admission?: Yes Transfer from: Home Referral Source: Self/Family/Friend     [facility] Crisis Care Plan Living Arrangements: Parent  Education Status Is patient currently in school?: No  Risk to self Suicidal Ideation: No Suicidal Intent: No Is patient at risk for suicide?: No Suicidal Plan?: No Access to Means: No What has been your use of drugs/alcohol within  the last 12 months?: daily opioid use for past 3 yrs Previous Attempts/Gestures: No Other Self Harm Risks: na Triggers for Past Attempts:  (na) Intentional Self Injurious Behavior: None Family Suicide History: No Recent stressful life event(s): Financial Problems;Other (Comment) (opioid dependence) Persecutory voices/beliefs?: No Depression: No Substance abuse history and/or treatment for substance abuse?: No Suicide prevention information given to non-admitted patients: Not applicable  Risk to Others Homicidal Ideation: No Thoughts of Harm to Others: No Current Homicidal Intent: No Current Homicidal Plan: No Access to Homicidal Means: No Identified Victim: none History of harm to others?: No Assessment of Violence: None Noted Violent Behavior Description: pt denies hx violence Does patient have access to weapons?: No Criminal Charges Pending?: No Does patient have a court date: No  Psychosis Hallucinations: None noted Delusions: None noted  Mental Status Report Appear/Hygiene: Disheveled Eye Contact: Good Motor Activity: Unremarkable;Freedom of movement Speech: Logical/coherent Level of Consciousness: Alert Mood: Anxious Affect: Other (Comment) (euthymic) Anxiety Level: Moderate Thought Processes: Coherent;Relevant Judgement: Unimpaired Orientation:  Person;Place;Time;Situation Obsessive Compulsive Thoughts/Behaviors: None  Cognitive Functioning Concentration: Normal Memory: Recent Intact;Remote Intact IQ: Average Insight: Fair Impulse Control: Poor Appetite: Fair Sleep: No Change Total Hours of Sleep: 5 Vegetative Symptoms: None  ADLScreening [facility] Assessment Services) Patient's cognitive ability adequate to safely complete daily activities?: Yes Patient able to express need for assistance with ADLs?: Yes Independently performs ADLs?: Yes (appropriate for developmental age)  Prior Inpatient Therapy Prior Inpatient Therapy: No Prior Therapy Dates: na Prior Therapy Facilty/Provider(s): na Reason for Treatment: na  Prior Outpatient Therapy Prior Outpatient Therapy: Yes Prior Therapy Dates: few times this august Prior Therapy Facilty/Provider(s): Dr. Lolly Mustache Reason for Treatment: outpatient for anxiety  ADL Screening (condition at time of admission) Patient's cognitive ability adequate to safely complete daily activities?: Yes Patient able to express need for assistance with ADLs?: Yes Independently performs ADLs?: Yes (appropriate for developmental age)         Values / Beliefs Cultural Requests During Hospitalization: None Spiritual Requests During Hospitalization: None   Advance Directives (For Healthcare) Advance Directive: Patient does not have advance directive;Patient would not like information    Additional Information 1:1 In Past 12 Months?: No CIRT Risk: No Elopement Risk: No Does patient have medical clearance?: Yes     Disposition:  Disposition Initial Assessment Completed for this Encounter: Yes Disposition of Patient: Other dispositions;Referred to Other disposition(s): Other (Comment);Referred to outside facility (Pt to be d/c with outpatient resources)  Donnamarie Rossetti P 08/04/2013 4:15 PM

## 2013-08-04 NOTE — ED Notes (Signed)
Has been taking about 8 percocet 10mg /325 per day for 4 years off and on.. The last one he took was this morning has been trying to stop but unable to do so without help. Pt had aneurysm in 2010 had surgery and was put on pain meds then has not been prescribed any pain meds in months but gets them from others.

## 2013-08-23 ENCOUNTER — Ambulatory Visit (HOSPITAL_COMMUNITY): Payer: Self-pay | Admitting: Psychiatry

## 2014-04-11 ENCOUNTER — Encounter (HOSPITAL_COMMUNITY): Payer: Self-pay | Admitting: Emergency Medicine

## 2014-04-11 ENCOUNTER — Emergency Department (HOSPITAL_COMMUNITY)
Admission: EM | Admit: 2014-04-11 | Discharge: 2014-04-11 | Disposition: A | Discharge status: Home / Self Care | Payer: Self-pay | Attending: Emergency Medicine | Admitting: Emergency Medicine

## 2014-04-11 DIAGNOSIS — J449 Chronic obstructive pulmonary disease, unspecified: Secondary | ICD-10-CM | POA: Insufficient documentation

## 2014-04-11 DIAGNOSIS — F17200 Nicotine dependence, unspecified, uncomplicated: Secondary | ICD-10-CM | POA: Insufficient documentation

## 2014-04-11 DIAGNOSIS — J4489 Other specified chronic obstructive pulmonary disease: Secondary | ICD-10-CM | POA: Insufficient documentation

## 2014-04-11 DIAGNOSIS — F411 Generalized anxiety disorder: Secondary | ICD-10-CM | POA: Insufficient documentation

## 2014-04-11 DIAGNOSIS — Z79899 Other long term (current) drug therapy: Secondary | ICD-10-CM | POA: Insufficient documentation

## 2014-04-11 DIAGNOSIS — F419 Anxiety disorder, unspecified: Secondary | ICD-10-CM

## 2014-04-11 DIAGNOSIS — I10 Essential (primary) hypertension: Secondary | ICD-10-CM | POA: Insufficient documentation

## 2014-04-11 DIAGNOSIS — Z792 Long term (current) use of antibiotics: Secondary | ICD-10-CM | POA: Insufficient documentation

## 2014-04-11 MED ORDER — LORAZEPAM 1 MG PO TABS
1.0000 mg | ORAL_TABLET | Freq: Once | ORAL | Status: AC
  Administered 2014-04-11: 1 mg via ORAL
  Filled 2014-04-11: qty 1

## 2014-04-11 MED ORDER — LORAZEPAM 1 MG PO TABS: 1.0000 mg | ORAL_TABLET | Freq: Three times a day (TID) | ORAL | Status: DC

## 2014-04-11 NOTE — ED Notes (Signed)
Pt states anxiety for 3-4 days with inability to sleep. Lives with mother.  Pt states no trigger for anxiety.  No thoughts of SI/HI and no hx of same.  Pt has hx of anxiety and was given meds in October which helped for a while.

## 2014-04-11 NOTE — ED Provider Notes (Signed)
CSN: [id number]     Arrival date & time 04/11/14  0854 History   First MD Initiated Contact with Patient 04/11/14 1018     Chief Complaint  Patient presents with  . Anxiety     (Consider location/radiation/quality/duration/timing/severity/associated sxs/prior Treatment) The history is provided by the patient and medical records.   This is a 58 y.o. M with PMH significant for anxiety, HTN, COPD, presenting to the ED for increased anxiety.  Pt states this has been a life-long battle but has been worse over the past 3-4 days.  He denies anxiety about anything specific and cannot think of any specific triggers which make his sx worse.  States he has been unable to sleep due to racing thoughts and cannot get his mind to relax.  He adamantly denies suicidal or homicidal thoughts.  No auditory or visual hallucinations.  Denies EtOH abuse or illicit drug use.  Patient does have an OP psychiatrist but unable to get appt this week because he has not been seen in several months.  Has taken ativan in the past which controlled his sx well.  Past Medical History  Diagnosis Date  . Anxiety   . Aneurysm   . Hypertension     high with anxiety  . COPD (chronic obstructive pulmonary disease)     smoker   Past Surgical History  Procedure Laterality Date  . Abdominal aortic aneurysm repair    . Tonsillectomy    . Tooth extraction N/A 06/14/2013    Procedure: EXTRACTIONS 17, 23, 26, 27, 29, 32;  Surgeon: Georgia LopesScott M Jensen, DDS;  Location: [facility] OR;  Service: Oral Surgery;  Laterality: N/A;  . Ear cyst excision Right 06/14/2013    Procedure: CYST REMOVAL RIGHT MANDIBLE;  Surgeon: Georgia LopesScott M Jensen, DDS;  Location: [facility] OR;  Service: Oral Surgery;  Laterality: Right;   Family History  Problem Relation Age of Onset  . Anxiety disorder Brother    History  Substance Use Topics  . Smoking status: Current Every Day Smoker -- 1.00 packs/day for 39 years    Types: Cigarettes  . Smokeless tobacco: Former NeurosurgeonUser  . Alcohol  Use: No    Review of Systems  Psychiatric/Behavioral: The patient is nervous/anxious.   All other systems reviewed and are negative.     Allergies  Review of patient's allergies indicates no known allergies.  Home Medications   Prior to Admission medications   Medication Sig Start Date End Date Taking? Authorizing Provider  amoxicillin (AMOXIL) 500 MG capsule Take 1 capsule (500 mg total) by mouth 4 (four) times daily. 06/14/13   Georgia LopesScott M Jensen, DDS  busPIRone (BUSPAR) 10 MG tablet Take 1 tablet (10 mg total) by mouth 2 (two) times daily. 06/22/13 06/22/14  Cleotis NipperSyed T Arfeen, MD  cloNIDine (CATAPRES) 0.2 MG tablet Take 0.5 tablets (0.1 mg total) by mouth every 6 (six) hours as needed. 08/04/13   Geoffery Lyonsouglas Delo, MD  LORazepam (ATIVAN) 0.5 MG tablet Take 1 tablet (0.5 mg total) by mouth daily as needed for anxiety. 06/22/13   Cleotis NipperSyed T Arfeen, MD  oxyCODONE-acetaminophen (PERCOCET) 10-325 MG per tablet Take 1-2 tablets by mouth every 4 (four) hours as needed for pain. 06/14/13   Georgia LopesScott M Jensen, DDS   BP 131/92  Pulse 93  Temp(Src) 98.5 F (36.9 C) (Oral)  Resp 20  SpO2 97%  Physical Exam  Nursing note and vitals reviewed. Constitutional: He is oriented to person, place, and time. He appears well-developed and well-nourished. No distress.  HENT:  Head: Normocephalic and atraumatic.  Mouth/Throat: Oropharynx is clear and moist.  Eyes: Conjunctivae and EOM are normal. Pupils are equal, round, and reactive to light.  Neck: Normal range of motion. Neck supple.  Cardiovascular: Normal rate, regular rhythm and normal heart sounds.   Pulmonary/Chest: Effort normal and breath sounds normal. No respiratory distress. He has no wheezes.  Abdominal: Soft. Bowel sounds are normal. There is no tenderness. There is no guarding.  Musculoskeletal: Normal range of motion.  Neurological: He is alert and oriented to person, place, and time.  Skin: Skin is warm and dry. He is not diaphoretic.  Psychiatric: His  speech is normal and behavior is normal. His mood appears anxious. He is not actively hallucinating. Thought content is not delusional. He expresses no homicidal and no suicidal ideation. He expresses no suicidal plans and no homicidal plans.  Pt appears anxious but with clear thought process and normal behavior; no SI/HI/AVH    ED Course  Procedures (including critical care time) Labs Review Labs Reviewed - No data to display  Imaging Review No results found.   EKG Interpretation None      MDM   Final diagnoses:  Anxiety   Pt with hx of anxiety, presenting with acute exacerbation of sx.  He adamantly denies SI/HI/AVH.  On exam he does appear anxious but not unstable.  He has no other complaints at this time and his VS are stable.  Will give short supply of ativan which he has taken in the past.  He was instructed to FU with his psychiatrist for further management of his sx.  Discussed plan with patient, he/she acknowledged understanding and agreed with plan of care.  Return precautions given for new or worsening symptoms-- specifically if develops any SI/HI/AVH.  Garlon HatchetLisa M Yolanda Huffstetler, PA-C 04/11/14 1223

## 2014-04-11 NOTE — ED Notes (Addendum)
Per EMS, pt from home.  States last 3 days he has not been able to sleep.  Having decreased appetite.  Anxious.  Pt prescribed meds 9 months ago for anxiety and has stopped taking it.  Pt denies pain or any physical discomforts.  Pt denies SI/HI to EMS.  Pt lives with mother.

## 2014-04-11 NOTE — Discharge Instructions (Signed)
Take the prescribed medication as directed to help with your anxiety. Follow-up with your psychiatrist. Return to the ED for new or worsening symptoms, specifically if you start to experience suicidal thoughts, homicidal thoughts, or hallucinations of any kind.

## 2014-04-14 NOTE — ED Provider Notes (Signed)
Medical screening examination/treatment/procedure(s) were performed by non-physician practitioner and as supervising physician I was immediately available for consultation/collaboration.   Milyn Stapleton T Zen Felling, MD 04/14/14 1508 

## 2015-03-15 ENCOUNTER — Emergency Department (HOSPITAL_COMMUNITY)
Admission: EM | Admit: 2015-03-15 | Discharge: 2015-03-15 | Disposition: A | Discharge status: Home / Self Care | Payer: Self-pay | Attending: Emergency Medicine | Admitting: Emergency Medicine

## 2015-03-15 ENCOUNTER — Encounter (HOSPITAL_COMMUNITY): Payer: Self-pay | Admitting: Emergency Medicine

## 2015-03-15 DIAGNOSIS — F1110 Opioid abuse, uncomplicated: Secondary | ICD-10-CM | POA: Insufficient documentation

## 2015-03-15 DIAGNOSIS — J449 Chronic obstructive pulmonary disease, unspecified: Secondary | ICD-10-CM | POA: Insufficient documentation

## 2015-03-15 DIAGNOSIS — Z792 Long term (current) use of antibiotics: Secondary | ICD-10-CM | POA: Insufficient documentation

## 2015-03-15 DIAGNOSIS — I10 Essential (primary) hypertension: Secondary | ICD-10-CM | POA: Insufficient documentation

## 2015-03-15 DIAGNOSIS — F419 Anxiety disorder, unspecified: Secondary | ICD-10-CM | POA: Insufficient documentation

## 2015-03-15 DIAGNOSIS — Z720 Tobacco use: Secondary | ICD-10-CM | POA: Insufficient documentation

## 2015-03-15 NOTE — ED Provider Notes (Signed)
CSN: [id number]     Arrival date & time 03/15/15  1117 History   First MD Initiated Contact with Patient 03/15/15 1138     Chief Complaint  Patient presents with  . Opiate Detox      (Consider location/radiation/quality/duration/timing/severity/associated sxs/prior Treatment) HPI Pt is a 59yo male with hx of anxiety, HTN, and COPD, presenting to ED requesting help with detox off of vicodin. Pt states he has been using off and on for several months but becomes anxious when trying to stop.  Pt states he did abuse alcohol but has not abused it in 10-12 years. States he now only drinks about 1 beer every now and then but states it does not help with anxiety he feels when trying to get off of vicodin. Last dose of vicodin was 4 days ago. Denies SI/HI.  Denies any use of cocaine or heroine. Denies chest pain, SOB, headache, abdominal pain, n/v/d.   Past Medical History  Diagnosis Date  . Anxiety   . Aneurysm   . Hypertension     high with anxiety  . COPD (chronic obstructive pulmonary disease)     smoker   Past Surgical History  Procedure Laterality Date  . Abdominal aortic aneurysm repair    . Tonsillectomy    . Tooth extraction N/A 06/14/2013    Procedure: EXTRACTIONS 17, 23, 26, 27, 29, 32;  Surgeon: Georgia LopesScott M Jensen, DDS;  Location: [facility] OR;  Service: Oral Surgery;  Laterality: N/A;  . Ear cyst excision Right 06/14/2013    Procedure: CYST REMOVAL RIGHT MANDIBLE;  Surgeon: Georgia LopesScott M Jensen, DDS;  Location: [facility] OR;  Service: Oral Surgery;  Laterality: Right;   Family History  Problem Relation Age of Onset  . Anxiety disorder Brother    History  Substance Use Topics  . Smoking status: Current Every Day Smoker -- 1.00 packs/day for 39 years    Types: Cigarettes  . Smokeless tobacco: Former NeurosurgeonUser  . Alcohol Use: No    Review of Systems  Constitutional: Negative for fever and chills.  Respiratory: Negative for shortness of breath.   Cardiovascular: Negative for chest pain and  palpitations.  Neurological: Negative for dizziness, seizures, syncope, light-headedness and headaches.  Psychiatric/Behavioral: Negative for suicidal ideas, hallucinations, sleep disturbance and self-injury. The patient is nervous/anxious.   All other systems reviewed and are negative.     Allergies  Review of patient's allergies indicates no known allergies.  Home Medications   Prior to Admission medications   Medication Sig Start Date End Date Taking? Authorizing Provider  amoxicillin (AMOXIL) 500 MG capsule Take 1 capsule (500 mg total) by mouth 4 (four) times daily. 06/14/13   Ocie DoyneScott Jensen, DDS  cloNIDine (CATAPRES) 0.2 MG tablet Take 0.5 tablets (0.1 mg total) by mouth every 6 (six) hours as needed. 08/04/13   Geoffery Lyonsouglas Delo, MD  LORazepam (ATIVAN) 0.5 MG tablet Take 1 tablet (0.5 mg total) by mouth daily as needed for anxiety. 06/22/13   Cleotis NipperSyed T Arfeen, MD  LORazepam (ATIVAN) 1 MG tablet Take 1 tablet (1 mg total) by mouth every 8 (eight) hours. 04/11/14   Garlon HatchetLisa M Sanders, PA-C  oxyCODONE-acetaminophen (PERCOCET) 10-325 MG per tablet Take 1-2 tablets by mouth every 4 (four) hours as needed for pain. 06/14/13   Ocie DoyneScott Jensen, DDS   BP 150/88 mmHg  Pulse 66  Temp(Src) 98.5 F (36.9 C) (Oral)  Resp 20  SpO2 100% Physical Exam  Constitutional: He is oriented to person, place, and time. He appears well-developed and  well-nourished.  HENT:  Head: Normocephalic and atraumatic.  Eyes: EOM are normal.  Neck: Normal range of motion.  Cardiovascular: Normal rate.   Pulmonary/Chest: Effort normal.  Musculoskeletal: Normal range of motion.  Neurological: He is alert and oriented to person, place, and time.  Skin: Skin is warm and dry.  Psychiatric: He has a normal mood and affect. His speech is normal and behavior is normal. Thought content normal.  Nursing note and vitals reviewed.   ED Course  Procedures (including critical care time) Labs Review Labs Reviewed - No data to  display  Imaging Review No results found.   EKG Interpretation None      MDM   Final diagnoses:  Opiate abuse, episodic   Pt requesting help with detox off vicodin. Reports anxiety but no other symptoms. Denies SI/HI. Pt appears well. Last dose of vicodin was 4 days ago. Pt does not meet inpatient criteria for detox. Pt discharged home with resources for substance abuse. Return precautions provided. Pt verbalized understanding and agreement with tx plan.     Junius Finnerrin O'Malley, PA-C 03/15/15 1202  Mirian MoMatthew Gentry, MD 03/17/15 [phone]

## 2015-03-15 NOTE — Discharge Instructions (Signed)
Opioid Withdrawal °Opioids are a group of narcotic drugs. They include the street drug heroin. They also include pain medicines, such as morphine, hydrocodone, oxycodone, and fentanyl. Opioid withdrawal is a group of characteristic physical and mental signs and symptoms. It typically occurs if you have been using opioids daily for several weeks or longer and stop using or rapidly decrease use. Opioid withdrawal can also occur if you have used opioids daily for a long time and are given a medicine to block the effect.  °SIGNS AND SYMPTOMS °Opioid withdrawal includes three or more of the following symptoms:  °· Depressed, anxious, or irritable mood. °· Nausea or vomiting. °· Muscle aches or spasms.   °· Watery eyes.    °· Runny nose. °· Dilated pupils, sweating, or hairs standing on end. °· Diarrhea or intestinal cramping. °· Yawning.   °· Fever. °· Increased blood pressure. °· Fast pulse. °· Restlessness or trouble sleeping. °These signs and symptoms occur within several hours of stopping or reducing short-acting opioids, such as heroin. They can occur within 3 days of stopping or reducing long-acting opioids, such as methadone. Withdrawal begins within minutes of receiving a drug that blocks the effects of opioids, such as naltrexone or naloxone. °DIAGNOSIS  °Opioid use disorder is diagnosed by your health care provider. You will be asked about your symptoms, drug and alcohol use, medical history, and use of medicines. A physical exam may be done. Lab tests may be ordered. Your health care provider may have you see a mental health professional.  °TREATMENT  °The treatment for opioid withdrawal is usually provided by medical doctors with special training in substance use disorders (addiction specialists). The following medicines may be included in treatment: °· Opioids given in place of the abused opioid. They turn on opioid receptors in the brain and lessen or prevent withdrawal symptoms. They are gradually  decreased (opioid substitution and taper). °· Non-opioids that can lessen certain opioid withdrawal symptoms. They may be used alone or with opioid substitution and taper. °Successful long-term recovery usually requires medicine, counseling, and group support. °HOME CARE INSTRUCTIONS  °· Take medicines only as directed by your health care provider. °· Check with your health care provider before starting new medicines. °· Keep all follow-up visits as directed by your health care provider. °SEEK MEDICAL CARE IF: °· You are not able to take your medicines as directed. °· Your symptoms get worse. °· You relapse. °SEEK IMMEDIATE MEDICAL CARE IF: °· You have serious thoughts about hurting yourself or others. °· You have a seizure. °· You lose consciousness. °Document Released: 10/17/2003 Document Revised: 02/28/2014 Document Reviewed: 10/27/2013 °Exit[employer]® Patient Information ©2015 [employer]. This information is not intended to replace advice given to you by your health care provider. Make sure you discuss any questions you have with your health care provider. ° ° °Opioid Use Disorder °Opioid use disorder is a mental disorder. It is the continued nonmedical use of opioids in spite of risks to health and well-being. Misused opioids include the street drug heroin. They also include pain medicines such as morphine, hydrocodone, oxycodone, and fentanyl. Opioids are very addictive. People who misuse opioids get an exaggerated feeling of well-being. Opioid use disorder often disrupts activities at home, work, or school. It may cause mental or physical problems.  °A family history of opioid use disorder puts you at higher risk of it. People with opioid use disorder often misuse other drugs or have mental illness such as depression, posttraumatic stress disorder, or antisocial personality disorder. They also are   at risk of suicide and death from overdose. °SIGNS AND SYMPTOMS  °Signs and symptoms of opioid use disorder  include: °· Use of opioids in larger amounts or over a longer period than intended. °· Unsuccessful attempts to cut down or control opioid use. °· A lot of time spent obtaining, using, or recovering from the effects of opioids. °· A strong desire or urge to use opioids (craving). °· Continued use of opioids in spite of major problems at work, school, or home because of use. °· Continued use of opioids in spite of relationship problems because of use. °· Giving up or cutting down on important life activities because of opioid use. °· Use of opioids over and over in situations when it is physically hazardous, such as driving a car. °· Continued use of opioids in spite of a physical problem that is likely related to use. Physical problems can include: °· Severe constipation. °· Poor nutrition. °· Infertility. °· Tuberculosis. °· Aspiration pneumonia. °· Infections such as human immunodeficiency virus (HIV) and hepatitis (from injecting opioids). °· Continued use of opioids in spite of a mental problem that is likely related to use. Mental problems can include: °· Depression. °· Anxiety. °· Hallucinations. °· Sleep problems. °· Loss of sexual function. °· Need to use more and more opioids to get the same effect, or lessened effect over time with use of the same amount (tolerance). °· Having withdrawal symptoms when opioid use is stopped, or using opioids to reduce or avoid withdrawal symptoms. Withdrawal symptoms include: °· Depressed, anxious, or irritable mood. °· Nausea, vomiting, diarrhea, or intestinal cramping. °· Muscle aches or spasms. °· Excessive tearing or runny nose. °· Dilated pupils, sweating, or hairs standing on end. °· Yawning. °· Fever, raised blood pressure, or fast pulse. °· Restlessness or trouble sleeping. This does not apply to people taking opioids for medical reasons only. °DIAGNOSIS °Opioid use disorder is diagnosed by your health care provider. You may be asked questions about your opioid use  and and how it affects your life. A physical exam may be done. A drug screen may be ordered. You may be referred to a mental health professional. The diagnosis of opioid use disorder requires at least two symptoms within 12 months. The type of opioid use disorder you have depends on the number of signs and symptoms you have. The type may be: °· Mild. Two or three signs and symptoms.    °· Moderate. Four or five signs and symptoms.   °· Severe. Six or more signs and symptoms. °TREATMENT  °Treatment is usually provided by mental health professionals with training in substance use disorders. The following options are available: °· Detoxification. This is the first step in treatment for withdrawal. It is medically supervised withdrawal with the use of medicines. These medicines lessen withdrawal symptoms. They also raise the chance of becoming opioid free. °· Counseling, also known as talk therapy. Talk therapy addresses the reasons you use opioids. It also addresses ways to keep you from using again (relapse). The goals of talk therapy are to avoid relapse by: °· Identifying and avoiding triggers for use. °· Finding healthy ways to cope with stress. °· Learning how to handle cravings. °· Support groups. Support groups provide emotional support, advice, and guidance. °· A medicine that blocks opioid receptors in your brain. This medicine can reduce opioid cravings that lead to relapse. This medicine also blocks the desired opioid effect when relapse occurs. °· Opioids that are taken by mouth in place of the misused opioid (opioid   maintenance treatment). These medicines satisfy cravings but are safer than commonly misused opioids. This often is the best option for people who continue to relapse with other treatments. °HOME CARE INSTRUCTIONS  °· Take medicines only as directed by your health care provider. °· Check with your health care provider before starting new medicines. °· Keep all follow-up visits as directed by  your health care provider. °SEEK MEDICAL CARE IF: °· You are not able to take your medicines as directed. °· Your symptoms get worse. °SEEK IMMEDIATE MEDICAL CARE IF: °· You have serious thoughts about hurting yourself or others. °· You may have taken an overdose of opioids. °FOR MORE INFORMATION °· National Institute on Drug Abuse: www.drugabuse.gov °· Substance Abuse and Mental Health Services Administration: www.samhsa.gov °Document Released: 08/11/2007 Document Revised: 02/28/2014 Document Reviewed: 10/27/2013 °[employer]® Patient Information ©2015 [employer]. This information is not intended to replace advice given to you by your health care provider. Make sure you discuss any questions you have with your health care provider. ° °

## 2015-03-15 NOTE — ED Notes (Signed)
Pt wanting detox from opiates.  Last opiate was 4 days ago.  Denies SI/HI.

## 2017-02-01 ENCOUNTER — Observation Stay (HOSPITAL_COMMUNITY): Admission: AD | Admit: 2017-02-01 | Payer: Self-pay | Source: Ambulatory Visit | Admitting: Interventional Cardiology

## 2017-02-01 ENCOUNTER — Inpatient Hospital Stay (HOSPITAL_COMMUNITY)
Admission: EM | Admit: 2017-02-01 | Discharge: 2017-02-04 | DRG: 247 | Disposition: A | Discharge status: Home / Self Care | Payer: Self-pay | Attending: Interventional Cardiology | Admitting: Interventional Cardiology

## 2017-02-01 ENCOUNTER — Encounter (HOSPITAL_COMMUNITY): Payer: Self-pay | Admitting: Emergency Medicine

## 2017-02-01 ENCOUNTER — Encounter (HOSPITAL_COMMUNITY)
Admission: EM | Disposition: A | Discharge status: Home / Self Care | Payer: Self-pay | Source: Home / Self Care | Attending: Interventional Cardiology

## 2017-02-01 DIAGNOSIS — Z720 Tobacco use: Secondary | ICD-10-CM

## 2017-02-01 DIAGNOSIS — I10 Essential (primary) hypertension: Secondary | ICD-10-CM | POA: Diagnosis present

## 2017-02-01 DIAGNOSIS — Z8679 Personal history of other diseases of the circulatory system: Secondary | ICD-10-CM

## 2017-02-01 DIAGNOSIS — I2510 Atherosclerotic heart disease of native coronary artery without angina pectoris: Secondary | ICD-10-CM | POA: Diagnosis present

## 2017-02-01 DIAGNOSIS — I213 ST elevation (STEMI) myocardial infarction of unspecified site: Secondary | ICD-10-CM

## 2017-02-01 DIAGNOSIS — Z955 Presence of coronary angioplasty implant and graft: Secondary | ICD-10-CM

## 2017-02-01 DIAGNOSIS — Z818 Family history of other mental and behavioral disorders: Secondary | ICD-10-CM

## 2017-02-01 DIAGNOSIS — J449 Chronic obstructive pulmonary disease, unspecified: Secondary | ICD-10-CM | POA: Diagnosis present

## 2017-02-01 DIAGNOSIS — I25119 Atherosclerotic heart disease of native coronary artery with unspecified angina pectoris: Secondary | ICD-10-CM

## 2017-02-01 DIAGNOSIS — E785 Hyperlipidemia, unspecified: Secondary | ICD-10-CM

## 2017-02-01 DIAGNOSIS — I340 Nonrheumatic mitral (valve) insufficiency: Secondary | ICD-10-CM | POA: Diagnosis present

## 2017-02-01 DIAGNOSIS — F17210 Nicotine dependence, cigarettes, uncomplicated: Secondary | ICD-10-CM | POA: Diagnosis present

## 2017-02-01 DIAGNOSIS — R079 Chest pain, unspecified: Secondary | ICD-10-CM

## 2017-02-01 DIAGNOSIS — I2121 ST elevation (STEMI) myocardial infarction involving left circumflex coronary artery: Secondary | ICD-10-CM

## 2017-02-01 DIAGNOSIS — I252 Old myocardial infarction: Secondary | ICD-10-CM | POA: Diagnosis present

## 2017-02-01 DIAGNOSIS — I739 Peripheral vascular disease, unspecified: Secondary | ICD-10-CM | POA: Diagnosis present

## 2017-02-01 DIAGNOSIS — I2129 ST elevation (STEMI) myocardial infarction involving other sites: Secondary | ICD-10-CM | POA: Diagnosis present

## 2017-02-01 DIAGNOSIS — I2119 ST elevation (STEMI) myocardial infarction involving other coronary artery of inferior wall: Principal | ICD-10-CM | POA: Diagnosis present

## 2017-02-01 HISTORY — DX: Acute myocardial infarction, unspecified: I21.9

## 2017-02-01 HISTORY — PX: CORONARY STENT INTERVENTION: CATH118234

## 2017-02-01 HISTORY — DX: Atherosclerotic heart disease of native coronary artery without angina pectoris: I25.10

## 2017-02-01 HISTORY — PX: LEFT HEART CATH AND CORONARY ANGIOGRAPHY: CATH118249

## 2017-02-01 LAB — COMPREHENSIVE METABOLIC PANEL
ALK PHOS: 60 U/L (ref 38–126)
ALT: 31 U/L (ref 17–63)
ANION GAP: 11 (ref 5–15)
AST: 30 U/L (ref 15–41)
Albumin: 3.6 g/dL (ref 3.5–5.0)
BUN: 7 mg/dL (ref 6–20)
CALCIUM: 8.6 mg/dL — AB (ref 8.9–10.3)
CHLORIDE: 104 mmol/L (ref 101–111)
CO2: 24 mmol/L (ref 22–32)
Creatinine, Ser: 0.82 mg/dL (ref 0.61–1.24)
Glucose, Bld: 123 mg/dL — ABNORMAL HIGH (ref 65–99)
Potassium: 3.5 mmol/L (ref 3.5–5.1)
SODIUM: 139 mmol/L (ref 135–145)
Total Bilirubin: 0.4 mg/dL (ref 0.3–1.2)
Total Protein: 5.9 g/dL — ABNORMAL LOW (ref 6.5–8.1)

## 2017-02-01 LAB — MRSA PCR SCREENING: MRSA by PCR: NEGATIVE

## 2017-02-01 LAB — I-STAT CHEM 8, ED
BUN: 7 mg/dL (ref 6–20)
Calcium, Ion: 1.1 mmol/L — ABNORMAL LOW (ref 1.15–1.40)
Chloride: 105 mmol/L (ref 101–111)
Creatinine, Ser: 0.9 mg/dL (ref 0.61–1.24)
Glucose, Bld: 131 mg/dL — ABNORMAL HIGH (ref 65–99)
HEMATOCRIT: 43 % (ref 39.0–52.0)
HEMOGLOBIN: 14.6 g/dL (ref 13.0–17.0)
POTASSIUM: 3.6 mmol/L (ref 3.5–5.1)
SODIUM: 140 mmol/L (ref 135–145)
TCO2: 24 mmol/L (ref 0–100)

## 2017-02-01 LAB — LIPID PANEL
CHOLESTEROL: 320 mg/dL — AB (ref 0–200)
Cholesterol: 314 mg/dL — ABNORMAL HIGH (ref 0–200)
HDL: 27 mg/dL — ABNORMAL LOW (ref >40)
LDL Cholesterol: UNDETERMINED mg/dL (ref 0–99)
LDL Cholesterol: UNDETERMINED mg/dL (ref 0–99)
Total CHOL/HDL Ratio: 11.9 RATIO
Triglycerides: 1319 mg/dL — ABNORMAL HIGH (ref <150)
Triglycerides: 852 mg/dL — ABNORMAL HIGH (ref <150)
VLDL: UNDETERMINED mg/dL (ref 0–40)
VLDL: UNDETERMINED mg/dL (ref 0–40)

## 2017-02-01 LAB — DIFFERENTIAL
BASOS ABS: 0.1 10*3/uL (ref 0.0–0.1)
Basophils Relative: 1 %
EOS ABS: 0.5 10*3/uL (ref 0.0–0.7)
Eosinophils Relative: 6 %
Lymphocytes Relative: 42 %
Lymphs Abs: 3.4 10*3/uL (ref 0.7–4.0)
Monocytes Absolute: 0.4 10*3/uL (ref 0.1–1.0)
Monocytes Relative: 5 %
NEUTROS ABS: 3.8 10*3/uL (ref 1.7–7.7)
Neutrophils Relative %: 46 %

## 2017-02-01 LAB — APTT: APTT: 38 s — AB (ref 24–36)

## 2017-02-01 LAB — CBC
HCT: 44.3 % (ref 39.0–52.0)
Hemoglobin: 15.1 g/dL (ref 13.0–17.0)
MCH: 30.5 pg (ref 26.0–34.0)
MCHC: 34.1 g/dL (ref 30.0–36.0)
MCV: 89.5 fL (ref 78.0–100.0)
PLATELETS: 225 10*3/uL (ref 150–400)
RBC: 4.95 MIL/uL (ref 4.22–5.81)
RDW: 12.9 % (ref 11.5–15.5)
WBC: 8.1 10*3/uL (ref 4.0–10.5)

## 2017-02-01 LAB — BRAIN NATRIURETIC PEPTIDE: B Natriuretic Peptide: 26.7 pg/mL (ref 0.0–100.0)

## 2017-02-01 LAB — I-STAT TROPONIN, ED: TROPONIN I, POC: 0 ng/mL (ref 0.00–0.08)

## 2017-02-01 LAB — PROTIME-INR
INR: 1.02
PROTHROMBIN TIME: 13.4 s (ref 11.4–15.2)

## 2017-02-01 LAB — TROPONIN I
Troponin I: <0.03 ng/mL (ref <0.03)
Troponin I: 12.55 ng/mL (ref <0.03)

## 2017-02-01 SURGERY — LEFT HEART CATH AND CORONARY ANGIOGRAPHY
Anesthesia: LOCAL

## 2017-02-01 MED ORDER — MORPHINE SULFATE (PF) 4 MG/ML IV SOLN: 2.0000 mg | INTRAVENOUS | Status: DC | PRN

## 2017-02-01 MED ORDER — IOPAMIDOL (ISOVUE-370) INJECTION 76%
INTRAVENOUS | Status: AC
  Filled 2017-02-01: qty 100

## 2017-02-01 MED ORDER — FENTANYL CITRATE (PF) 100 MCG/2ML IJ SOLN
INTRAMUSCULAR | Status: DC | PRN
  Administered 2017-02-01: 50 ug via INTRAVENOUS

## 2017-02-01 MED ORDER — HEPARIN SODIUM (PORCINE) 5000 UNIT/ML IJ SOLN
5000.0000 [IU] | Freq: Three times a day (TID) | INTRAMUSCULAR | Status: DC
  Administered 2017-02-01 – 2017-02-04 (×6): 5000 [IU] via SUBCUTANEOUS
  Filled 2017-02-01 (×6): qty 1

## 2017-02-01 MED ORDER — HYDRALAZINE HCL 20 MG/ML IJ SOLN: 5.0000 mg | INTRAMUSCULAR | Status: AC | PRN

## 2017-02-01 MED ORDER — IOPAMIDOL (ISOVUE-370) INJECTION 76%
INTRAVENOUS | Status: DC | PRN
  Administered 2017-02-01: 175 mL via INTRA_ARTERIAL

## 2017-02-01 MED ORDER — ASPIRIN 81 MG PO CHEW
81.0000 mg | CHEWABLE_TABLET | Freq: Every day | ORAL | Status: DC
  Administered 2017-02-01 – 2017-02-04 (×4): 81 mg via ORAL
  Filled 2017-02-01 (×4): qty 1

## 2017-02-01 MED ORDER — ATORVASTATIN CALCIUM 80 MG PO TABS
80.0000 mg | ORAL_TABLET | Freq: Every day | ORAL | Status: DC
  Administered 2017-02-02 – 2017-02-03 (×2): 80 mg via ORAL
  Filled 2017-02-01 (×2): qty 1

## 2017-02-01 MED ORDER — MORPHINE SULFATE (PF) 4 MG/ML IV SOLN
INTRAVENOUS | Status: AC
  Administered 2017-02-01: 2 mg
  Filled 2017-02-01: qty 1

## 2017-02-01 MED ORDER — HEPARIN (PORCINE) IN NACL 2-0.9 UNIT/ML-% IJ SOLN
INTRAMUSCULAR | Status: DC | PRN
  Administered 2017-02-01: 1000 mL

## 2017-02-01 MED ORDER — SODIUM CHLORIDE 0.9 % IV SOLN: INTRAVENOUS | Status: AC

## 2017-02-01 MED ORDER — HEPARIN SODIUM (PORCINE) 1000 UNIT/ML IJ SOLN
INTRAMUSCULAR | Status: AC
  Filled 2017-02-01: qty 1

## 2017-02-01 MED ORDER — TICAGRELOR 90 MG PO TABS: 90.0000 mg | ORAL_TABLET | Freq: Two times a day (BID) | ORAL | Status: DC

## 2017-02-01 MED ORDER — FENTANYL CITRATE (PF) 100 MCG/2ML IJ SOLN
INTRAMUSCULAR | Status: AC
  Filled 2017-02-01: qty 2

## 2017-02-01 MED ORDER — LIDOCAINE HCL (PF) 1 % IJ SOLN
INTRAMUSCULAR | Status: DC | PRN
  Administered 2017-02-01: 2 mL via SUBCUTANEOUS

## 2017-02-01 MED ORDER — VERAPAMIL HCL 2.5 MG/ML IV SOLN
INTRAVENOUS | Status: DC | PRN
  Administered 2017-02-01: 10 mL via INTRA_ARTERIAL

## 2017-02-01 MED ORDER — SODIUM CHLORIDE 0.9% FLUSH
3.0000 mL | Freq: Two times a day (BID) | INTRAVENOUS | Status: DC
  Administered 2017-02-01 – 2017-02-02 (×3): 3 mL via INTRAVENOUS

## 2017-02-01 MED ORDER — ONDANSETRON HCL 4 MG/2ML IJ SOLN: 4.0000 mg | Freq: Four times a day (QID) | INTRAMUSCULAR | Status: DC | PRN

## 2017-02-01 MED ORDER — NITROGLYCERIN 1 MG/10 ML FOR IR/CATH LAB
INTRA_ARTERIAL | Status: DC | PRN
  Administered 2017-02-01: 200 ug via INTRACORONARY

## 2017-02-01 MED ORDER — HEPARIN SODIUM (PORCINE) 5000 UNIT/ML IJ SOLN
INTRAMUSCULAR | Status: AC
  Administered 2017-02-01: 4000 [IU] via INTRAVENOUS
  Filled 2017-02-01: qty 1

## 2017-02-01 MED ORDER — HEPARIN (PORCINE) IN NACL 2-0.9 UNIT/ML-% IJ SOLN
INTRAMUSCULAR | Status: AC
  Filled 2017-02-01: qty 1000

## 2017-02-01 MED ORDER — SODIUM CHLORIDE 0.9 % IV SOLN
INTRAVENOUS | Status: DC | PRN
  Administered 2017-02-01: 10 mL/h via INTRAVENOUS

## 2017-02-01 MED ORDER — SODIUM CHLORIDE 0.9 % IV SOLN
INTRAVENOUS | Status: DC | PRN
  Administered 2017-02-01: 100 mL/h via INTRAVENOUS

## 2017-02-01 MED ORDER — TICAGRELOR 90 MG PO TABS
ORAL_TABLET | ORAL | Status: DC | PRN
  Administered 2017-02-01: 180 mg via ORAL

## 2017-02-01 MED ORDER — SODIUM CHLORIDE 0.9 % IV SOLN
250.0000 mL | INTRAVENOUS | Status: DC | PRN
Start: 2017-02-01 — End: 2017-02-03

## 2017-02-01 MED ORDER — HEPARIN SODIUM (PORCINE) 5000 UNIT/ML IJ SOLN
4000.0000 [IU] | Freq: Once | INTRAMUSCULAR | Status: AC
  Administered 2017-02-01: 4000 [IU] via INTRAVENOUS

## 2017-02-01 MED ORDER — HEPARIN SODIUM (PORCINE) 1000 UNIT/ML IJ SOLN
INTRAMUSCULAR | Status: DC | PRN
  Administered 2017-02-01: 1500 [IU] via INTRAVENOUS
  Administered 2017-02-01: 6000 [IU] via INTRAVENOUS

## 2017-02-01 MED ORDER — TICAGRELOR 90 MG PO TABS
ORAL_TABLET | ORAL | Status: AC
  Filled 2017-02-01: qty 2

## 2017-02-01 MED ORDER — ACETAMINOPHEN 325 MG PO TABS
650.0000 mg | ORAL_TABLET | ORAL | Status: DC | PRN
  Administered 2017-02-03: 22:00:00 650 mg via ORAL
  Filled 2017-02-01: qty 2

## 2017-02-01 MED ORDER — IOPAMIDOL (ISOVUE-370) INJECTION 76%
INTRAVENOUS | Status: AC
  Filled 2017-02-01: qty 125

## 2017-02-01 MED ORDER — SODIUM CHLORIDE 0.9% FLUSH: 3.0000 mL | INTRAVENOUS | Status: DC | PRN

## 2017-02-01 MED ORDER — NITROGLYCERIN 1 MG/10 ML FOR IR/CATH LAB
INTRA_ARTERIAL | Status: AC
  Filled 2017-02-01: qty 10

## 2017-02-01 MED ORDER — VERAPAMIL HCL 2.5 MG/ML IV SOLN
INTRAVENOUS | Status: AC
  Filled 2017-02-01: qty 2

## 2017-02-01 MED ORDER — OXYCODONE-ACETAMINOPHEN 5-325 MG PO TABS
1.0000 | ORAL_TABLET | ORAL | Status: DC | PRN
  Administered 2017-02-03: 18:00:00 2 via ORAL
  Filled 2017-02-01: qty 2

## 2017-02-01 MED ORDER — LIDOCAINE HCL (PF) 1 % IJ SOLN
INTRAMUSCULAR | Status: AC
  Filled 2017-02-01: qty 30

## 2017-02-01 MED ORDER — LABETALOL HCL 5 MG/ML IV SOLN: 10.0000 mg | INTRAVENOUS | Status: AC | PRN

## 2017-02-01 MED ORDER — TICAGRELOR 90 MG PO TABS
90.0000 mg | ORAL_TABLET | Freq: Two times a day (BID) | ORAL | Status: DC
  Administered 2017-02-02 – 2017-02-04 (×5): 90 mg via ORAL
  Filled 2017-02-01 (×5): qty 1

## 2017-02-01 SURGICAL SUPPLY — 16 items
BALLN EMERGE MR 2.5X12 (BALLOONS) ×2
BALLOON EMERGE MR 2.5X12 (BALLOONS) IMPLANT
CATH EXPO 5F FL3.5 (CATHETERS) ×1 IMPLANT
CATH INFINITI JR4 5F (CATHETERS) ×1 IMPLANT
CATH VISTA GUIDE 6FR XB3.5 (CATHETERS) ×1 IMPLANT
DEVICE RAD COMP TR BAND LRG (VASCULAR PRODUCTS) ×1 IMPLANT
GLIDESHEATH SLEND A-KIT 6F 22G (SHEATH) ×1 IMPLANT
GUIDEWIRE INQWIRE 1.5J.035X260 (WIRE) IMPLANT
INQWIRE 1.5J .035X260CM (WIRE) ×2
KIT ENCORE 26 ADVANTAGE (KITS) ×1 IMPLANT
KIT HEART LEFT (KITS) ×2 IMPLANT
PACK CARDIAC CATHETERIZATION (CUSTOM PROCEDURE TRAY) ×2 IMPLANT
STENT SYNERGY DES 2.5X16 (Permanent Stent) ×1 IMPLANT
TRANSDUCER W/STOPCOCK (MISCELLANEOUS) ×2 IMPLANT
TUBING CIL FLEX 10 FLL-RA (TUBING) ×2 IMPLANT
WIRE ASAHI PROWATER 180CM (WIRE) ×1 IMPLANT

## 2017-02-01 NOTE — ED Triage Notes (Signed)
Pt here from home as a code stemi , pt is a smoker pt received 324 asa and 2 nitro by ems

## 2017-02-01 NOTE — H&P (Addendum)
Anthony Coffey is a 61 y.o. male  Admit Date: 02/01/2017 Referring Physician: Emergency Department  Primary Cardiologist:: HWBSmith Chief complaint / reason for admission: inferior ST elevation MI, acute  HPI: 61 year old gentleman who had LIMA minutes of chest pain at 1:30 PM that resolved spontaneously. At 3:45 PM the discomfort recurred, was severe, and he called EMS. Upon assessment, STEMI activation occurred. Upon arrival in the emergency room he was having 5 out of 10 pain. There was persistent ST elevation on the EKG. He has not seen a physician in many years. He had aortic aneurysm repair 11 years ago, during acute rupture by Dr. Betti Cruz.  He smokes one pack of cigarettes per day and has done so for greater than 40 years. He has a chronic cough. Never had this type discomfort before but has had "indigestion". He is sedentary. See as he has no energy.    PMH:    Past Medical History:  Diagnosis Date  . Aneurysm (HCC)   . Anxiety   . COPD (chronic obstructive pulmonary disease) (HCC)    smoker  . Hypertension    high with anxiety    PSH:    Past Surgical History:  Procedure Laterality Date  . ABDOMINAL AORTIC ANEURYSM REPAIR    . EAR CYST EXCISION Right 06/14/2013   Procedure: CYST REMOVAL RIGHT MANDIBLE;  Surgeon: Georgia Lopes, DDS;  Location: [facility] OR;  Service: Oral Surgery;  Laterality: Right;  . TONSILLECTOMY    . TOOTH EXTRACTION N/A 06/14/2013   Procedure: EXTRACTIONS 17, 23, 26, 27, 29, 32;  Surgeon: Georgia Lopes, DDS;  Location: [facility] OR;  Service: Oral Surgery;  Laterality: N/A;   ALLERGIES:   Patient has no known allergies. Prior to Admit Meds:   Prescriptions Prior to Admission  Medication Sig Dispense Refill Last Dose  . amoxicillin (AMOXIL) 500 MG capsule Take 1 capsule (500 mg total) by mouth 4 (four) times daily. 28 capsule 0 Taking  . cloNIDine (CATAPRES) 0.2 MG tablet Take 0.5 tablets (0.1 mg total) by mouth every 6 (six) hours as needed. 15 tablet 0     . LORazepam (ATIVAN) 0.5 MG tablet Take 1 tablet (0.5 mg total) by mouth daily as needed for anxiety. 30 tablet 1   . LORazepam (ATIVAN) 1 MG tablet Take 1 tablet (1 mg total) by mouth every 8 (eight) hours. 21 tablet 0   . oxyCODONE-acetaminophen (PERCOCET) 10-325 MG per tablet Take 1-2 tablets by mouth every 4 (four) hours as needed for pain. 40 tablet 0 Taking   Family HX:    Family History  Problem Relation Age of Onset  . Anxiety disorder Brother    Social HX:    Social History   Social History  . Marital status: Single    Spouse name: N/A  . Number of children: N/A  . Years of education: N/A   Occupational History  . Not on file.   Social History Main Topics  . Smoking status: Current Every Day Smoker    Packs/day: 1.00    Years: 39.00    Types: Cigarettes  . Smokeless tobacco: Former Neurosurgeon  . Alcohol use No  . Drug use: No  . Sexual activity: No   Other Topics Concern  . Not on file   Social History Narrative   ** Merged History Encounter **         ROS cough, decreased energy, difficulty with sleep All other systems are negative.  Physical Exam: Blood  pressure 109/66, pulse (!) 0, temperature 97.9 F (36.6 C), temperature source Oral, resp. rate (!) 28, weight 164 lb 14.5 oz (74.8 kg), SpO2 (!) 0 %.    Appears older than his stated age.he is uncomfortable Skin is dusky appearing HEENT exam reveals no jaundice or pallor. Chest is clear to auscultation and percussion. Cardiac exam reveals no S4 gallop but no murmur. Heart tones are distant. Abdomen reveals a healed midline scar from previous abdominal aortic aneurysm repair. Lower abdominal bruit is heard. Extremities reveal 2+ radial pulses bilateral. 2+ right posterior tibial pulse. Pulses absenti n the left foot. Temperature is normal.bilateral femoral bruits are heard. Color is normal. Neurological exam is normal. No focal deficits are noted.   Labs: Lab Results  Component Value Date   WBC 8.1  02/01/2017   HGB 14.6 02/01/2017   HCT 43.0 02/01/2017   MCV 89.5 02/01/2017   PLT 225 02/01/2017    Recent Labs Lab 02/01/17 1654  NA 140  K 3.6  CL 105  BUN 7  CREATININE 0.90  GLUCOSE 131*   Lab Results  Component Value Date   CKTOTAL 131 06/02/2009   CKMB 2.5 06/02/2009   TROPONINI (H) 06/02/2009    0.16        PERSISTENTLY INCREASED TROPONIN VALUES IN THE RANGE OF 0.06-0.49 ng/mL CAN BE SEEN IN:       -UNSTABLE ANGINA       -CONGESTIVE HEART FAILURE       -MYOCARDITIS       -CHEST TRAUMA       -ARRYHTHMIAS       -LATE PRESENTING MI       -COPD   CLINICAL FOLLOW-UP RECOMMENDED.      Radiology:  No results found.  EKG:  Sinus rhythm with ST elevation 2, 3, aVF, and lateral precordial leads. Reciprocal changes are noted in V leads 1 and 2.  ASSESSMENT:  1. Acute inferolateral ST elevation myocardial infarction less than 3 hours in duration . 2. History of abdominal aortic aneurysm rupture and urgent repair greater than 11 years ago.  3. Tobacco abuse  4. No recent or consistent health care contact  5. PAD with absent left pedal pulses and bilateral femoral bruits.  Plan:  1. Emergency cardiac catheterization with vascularization strategy dependent upon anatomy. 2.The patient was counseled to undergo left heart catheterization, coronary angiography, and possible percutaneous coronary intervention with stent implantation. The procedural risks and benefits were discussed in detail. The risks discussed included death, stroke, myocardial infarction, life-threatening bleeding, limb ischemia, kidney injury, allergy, and possible emergency cardiac surgery. The risk of these significant complications were estimated to occur less than 1% of the time. After discussion, the patient has agreed to proceed.  Critical care time 40 minutes  Lyn Records III 02/01/2017 5:49 PM

## 2017-02-01 NOTE — ED Provider Notes (Signed)
[facility]-EMERGENCY DEPT Provider Note   CSN: [id number] Arrival date & time: 02/01/17  1640     History   Chief Complaint Chief Complaint  Patient presents with  . Code STEMI    HPI Anthony Coffey is a 61 y.o. male.  The history is provided by the patient and the EMS personnel.  Chest Pain   This is a new problem. The current episode started 3 to 5 hours ago. The problem occurs constantly. The problem has been gradually worsening. The pain is associated with rest. The pain is present in the substernal region. The pain is at a severity of 8/10. The pain is moderate. The quality of the pain is described as pressure-like. The pain does not radiate. Pertinent negatives include no abdominal pain, no back pain, no claudication, no cough, no diaphoresis, no dizziness, no fever, no headaches, no hemoptysis, no irregular heartbeat, no leg pain, no lower extremity edema, no nausea, no near-syncope, no numbness, no orthopnea, no palpitations, no PND, no shortness of breath, no sputum production, no syncope, no vomiting and no weakness. He has tried nitroglycerin (324 aspirin) for the symptoms. The treatment provided moderate relief. Risk factors include male gender, lack of exercise, obesity and smoking/tobacco exposure.  His past medical history is significant for aneurysm.  Pertinent negatives for past medical history include no hyperlipidemia, no hypertension, no MI and no seizures.  Pertinent negatives for family medical history include: no CAD.  Procedure history is negative for cardiac catheterization.    Past Medical History:  Diagnosis Date  . Aneurysm (HCC)   . Anxiety   . COPD (chronic obstructive pulmonary disease) (HCC)    smoker  . Hypertension    high with anxiety    There are no active problems to display for this patient.   Past Surgical History:  Procedure Laterality Date  . ABDOMINAL AORTIC ANEURYSM REPAIR    . EAR CYST EXCISION Right 06/14/2013   Procedure: CYST REMOVAL  RIGHT MANDIBLE;  Surgeon: Georgia Lopes, DDS;  Location: [facility] OR;  Service: Oral Surgery;  Laterality: Right;  . TONSILLECTOMY    . TOOTH EXTRACTION N/A 06/14/2013   Procedure: EXTRACTIONS 17, 23, 26, 27, 29, 32;  Surgeon: Georgia Lopes, DDS;  Location: [facility] OR;  Service: Oral Surgery;  Laterality: N/A;       Home Medications    Prior to Admission medications   Medication Sig Start Date End Date Taking? Authorizing Provider  amoxicillin (AMOXIL) 500 MG capsule Take 1 capsule (500 mg total) by mouth 4 (four) times daily. 06/14/13   Ocie Doyne, DDS  cloNIDine (CATAPRES) 0.2 MG tablet Take 0.5 tablets (0.1 mg total) by mouth every 6 (six) hours as needed. 08/04/13   Geoffery Lyons, MD  LORazepam (ATIVAN) 0.5 MG tablet Take 1 tablet (0.5 mg total) by mouth daily as needed for anxiety. 06/22/13   Cleotis Nipper, MD  LORazepam (ATIVAN) 1 MG tablet Take 1 tablet (1 mg total) by mouth every 8 (eight) hours. 04/11/14   Garlon Hatchet, PA-C  oxyCODONE-acetaminophen (PERCOCET) 10-325 MG per tablet Take 1-2 tablets by mouth every 4 (four) hours as needed for pain. 06/14/13   Ocie Doyne, DDS    Family History Family History  Problem Relation Age of Onset  . Anxiety disorder Brother     Social History Social History  Substance Use Topics  . Smoking status: Current Every Day Smoker    Packs/day: 1.00    Years: 39.00    Types:  Cigarettes  . Smokeless tobacco: Former Neurosurgeon  . Alcohol use No     Allergies   Patient has no known allergies.   Review of Systems Review of Systems  Constitutional: Negative for chills, diaphoresis and fever.  HENT: Negative for ear pain and sore throat.   Eyes: Negative for pain and visual disturbance.  Respiratory: Positive for chest tightness. Negative for cough, hemoptysis, sputum production and shortness of breath.   Cardiovascular: Positive for chest pain. Negative for palpitations, orthopnea, claudication, syncope, PND and near-syncope.  Gastrointestinal:  Negative for abdominal pain, nausea and vomiting.  Genitourinary: Negative for dysuria and hematuria.  Musculoskeletal: Negative for arthralgias and back pain.  Skin: Negative for color change and rash.  Neurological: Negative for dizziness, seizures, syncope, weakness, numbness and headaches.  All other systems reviewed and are negative.    Physical Exam Updated Vital Signs BP 139/79   Pulse 69   Temp 97.9 F (36.6 C) (Oral)   Resp 14   Wt 74.8 kg   SpO2 99%   BMI 24.35 kg/m   Physical Exam  Constitutional: He appears well-developed and well-nourished.  HENT:  Head: Normocephalic and atraumatic.  Eyes: Conjunctivae and EOM are normal.  Neck: Neck supple.  Cardiovascular: Normal rate and regular rhythm.   No murmur heard. Pulmonary/Chest: Effort normal and breath sounds normal. No respiratory distress.  Abdominal: Soft. There is no tenderness.    Musculoskeletal: He exhibits no edema.  Neurological: He is alert.  Skin: Skin is warm and dry.  Psychiatric: He has a normal mood and affect.  Nursing note and vitals reviewed.    ED Treatments / Results  Labs (all labs ordered are listed, but only abnormal results are displayed) Labs Reviewed  CBC  DIFFERENTIAL  PROTIME-INR  APTT  COMPREHENSIVE METABOLIC PANEL  TROPONIN I  LIPID PANEL  I-STAT CHEM 8, ED    EKG  EKG Interpretation None       Radiology No results found.  Procedures Procedures (including critical care time)  Medications Ordered in ED Medications  heparin injection 4,000 Units (not administered)  heparin 5000 UNIT/ML injection (4,000 Units  Given 02/01/17 1650)  morphine 4 MG/ML injection (2 mg  Given 02/01/17 1651)     Initial Impression / Assessment and Plan / ED Course  I have reviewed the triage vital signs and the nursing notes.  Pertinent labs & imaging results that were available during my care of the patient were reviewed by me and considered in my medical decision making (see  chart for details).     61 year old male with history of repaired abdominal aortic aneurysm presents in setting of ST segment elevation myocardial infarction. Patient reports at approximately 1:30 today he had retrosternal chest pressure. He reports he went to smoke cigarettes and pain improved. He reports approximately 3:30 pain returned and EMS was called. On arrival EMS EKG revealed inferior STEMI. Patient given 324 mg of aspirin and 2 nitroglycerin tablets with improvement in pain but with continued pressure with pain on scale 3/10. IV access obtained patient hemogram is stable now.  Code STEMI initiated on arrival and Dr. Katrinka Blazing for cardiology at bedside to evaluate. Repeat EKG and emergency department revealed continued inferior STEMI. Patient started on heparin and basic laboratory analysis obtained. IV fluids were established and patient taken quickly to the Cath Lab for further management STEMI. Stable at time of transfer of care and no results from labs were available at time of transfer of care.  Attending has seen  and available patient and Dr. Erma Heritage is in agreement with plan.  Final Clinical Impressions(s) / ED Diagnoses   Final diagnoses:  Chest pain, unspecified type  ST elevation myocardial infarction (STEMI), unspecified artery [facility])    New Prescriptions New Prescriptions   No medications on file     Stacy Gardner, MD 02/01/17 1657    Shaune Pollack, MD 02/02/17 1214

## 2017-02-01 NOTE — Progress Notes (Signed)
CRITICAL VALUE ALERT  Critical value received:  Troponin I 12.55  Date of notification:  t  Time of notification: 1952  Critical value read back:Yes.    Nurse who received alert:  Almedia Balls RN  MD notified (1st page): Cards fellow  Time of first page:  2055  MD notified (2nd page):  Time of second page:  Responding MD:  Dr Sandy Salaam  Time MD responded:  2056

## 2017-02-01 NOTE — ED Notes (Signed)
Pt transported to cath lab with Caryn Bee RN and Dr Katrinka Blazing

## 2017-02-01 NOTE — Progress Notes (Signed)
   Escorted family to 2N waiting area.

## 2017-02-02 ENCOUNTER — Encounter (HOSPITAL_COMMUNITY): Payer: Self-pay | Admitting: *Deleted

## 2017-02-02 LAB — CBC
HCT: 43.8 % (ref 39.0–52.0)
HEMOGLOBIN: 15.1 g/dL (ref 13.0–17.0)
MCH: 31.1 pg (ref 26.0–34.0)
MCHC: 34.5 g/dL (ref 30.0–36.0)
MCV: 90.1 fL (ref 78.0–100.0)
PLATELETS: 225 10*3/uL (ref 150–400)
RBC: 4.86 MIL/uL (ref 4.22–5.81)
RDW: 13.1 % (ref 11.5–15.5)
WBC: 9.8 10*3/uL (ref 4.0–10.5)

## 2017-02-02 LAB — COMPREHENSIVE METABOLIC PANEL
ALT: 46 U/L (ref 17–63)
AST: 113 U/L — AB (ref 15–41)
Albumin: 3.3 g/dL — ABNORMAL LOW (ref 3.5–5.0)
Alkaline Phosphatase: 57 U/L (ref 38–126)
Anion gap: 10 (ref 5–15)
BILIRUBIN TOTAL: 0.9 mg/dL (ref 0.3–1.2)
BUN: 5 mg/dL — AB (ref 6–20)
CALCIUM: 8.8 mg/dL — AB (ref 8.9–10.3)
CO2: 19 mmol/L — ABNORMAL LOW (ref 22–32)
Chloride: 109 mmol/L (ref 101–111)
Creatinine, Ser: 0.72 mg/dL (ref 0.61–1.24)
Glucose, Bld: 145 mg/dL — ABNORMAL HIGH (ref 65–99)
POTASSIUM: 4 mmol/L (ref 3.5–5.1)
Sodium: 138 mmol/L (ref 135–145)
Total Protein: 5.9 g/dL — ABNORMAL LOW (ref 6.5–8.1)

## 2017-02-02 LAB — TROPONIN I
TROPONIN I: 21.55 ng/mL — AB (ref <0.03)
Troponin I: 47.01 ng/mL (ref <0.03)

## 2017-02-02 MED ORDER — ASPIRIN 81 MG PO CHEW
81.0000 mg | CHEWABLE_TABLET | ORAL | Status: AC
  Administered 2017-02-03: 81 mg via ORAL
  Filled 2017-02-02: qty 1

## 2017-02-02 MED ORDER — SODIUM CHLORIDE 0.9% FLUSH
3.0000 mL | Freq: Two times a day (BID) | INTRAVENOUS | Status: DC
  Administered 2017-02-03: 3 mL via INTRAVENOUS

## 2017-02-02 MED ORDER — SODIUM CHLORIDE 0.9 % IV SOLN: 250.0000 mL | INTRAVENOUS | Status: DC | PRN

## 2017-02-02 MED ORDER — VARENICLINE TARTRATE 1 MG PO TABS
1.0000 mg | ORAL_TABLET | Freq: Every day | ORAL | Status: DC
  Administered 2017-02-02 – 2017-02-04 (×3): 1 mg via ORAL
  Filled 2017-02-02 (×3): qty 1

## 2017-02-02 MED ORDER — SODIUM CHLORIDE 0.9% FLUSH: 3.0000 mL | INTRAVENOUS | Status: DC | PRN

## 2017-02-02 MED ORDER — ZOLPIDEM TARTRATE 5 MG PO TABS
5.0000 mg | ORAL_TABLET | Freq: Once | ORAL | Status: AC
  Administered 2017-02-02: 5 mg via ORAL
  Filled 2017-02-02: qty 1

## 2017-02-02 MED ORDER — SODIUM CHLORIDE 0.9 % WEIGHT BASED INFUSION: 1.0000 mL/kg/h | INTRAVENOUS | Status: DC

## 2017-02-02 MED ORDER — SODIUM CHLORIDE 0.9 % WEIGHT BASED INFUSION
3.0000 mL/kg/h | INTRAVENOUS | Status: DC
  Administered 2017-02-03: 3 mL/kg/h via INTRAVENOUS

## 2017-02-02 NOTE — Progress Notes (Signed)
INTERVENTIONAL CARDIOLOGY  Discussed the high-grade residual LAD disease identified during the acute intervention on his circumflex.  Recommended staged LAD PCI 02/03/2017. The patient was counseled to undergo left heart catheterization, coronary angiography, and possible percutaneous coronary intervention with stent implantation. The procedural risks and benefits were discussed in detail. The risks discussed included death, stroke, myocardial infarction, life-threatening bleeding, limb ischemia, kidney injury, allergy, and possible emergency cardiac surgery. The risk of these significant complications were estimated to occur less than 1% of the time. After discussion, the patient has agreed to proceed. Orders have been written.

## 2017-02-02 NOTE — Progress Notes (Signed)
Progress Note  Patient Name: Anthony Coffey Date of Encounter: 02/02/2017  Primary Cardiologist: new  Subjective   No additional chest pain or sob.  Inpatient Medications    Scheduled Meds: . aspirin  81 mg Oral Daily  . atorvastatin  80 mg Oral q1800  . heparin  4,000 Units Intravenous Once  . heparin  5,000 Units Subcutaneous Q8H  . sodium chloride flush  3 mL Intravenous Q12H  . ticagrelor  90 mg Oral BID   Continuous Infusions:  PRN Meds: sodium chloride, acetaminophen, morphine injection, ondansetron (ZOFRAN) IV, oxyCODONE-acetaminophen, sodium chloride flush   Vital Signs    Vitals:   02/02/17 0427 02/02/17 0700 02/02/17 0800 02/02/17 1000  BP: 110/68  (!) 115/58 124/67  Pulse: 67 (!) 54 (!) 54 62  Resp: 18 (!) Temp: 98 F (36.7 C)  98.4 F (36.9 C)   TempSrc: Oral  Oral   SpO2: 97% 96% 96% 96%  Weight:        Intake/Output Summary (Last 24 hours) at 02/02/17 1044 Last data filed at 02/02/17 1000  Gross per 24 hour  Intake             1300 ml  Output             2475 ml  Net            -1175 ml   Filed Weights   02/01/17 1650  Weight: 164 lb 14.5 oz (74.8 kg)    Telemetry    nsr - Personally Reviewed  ECG    nsr with resolved ST elevation - Personally Reviewed  Physical Exam   GEN: No acute distress.   Neck: 5 cm JVD Cardiac: RRR, no murmurs, rubs, or gallops.  Respiratory: Clear to auscultation bilaterally. GI: Soft, nontender, non-distended  MS: No edema; No deformity. Neuro:  Nonfocal  Psych: Normal affect   Labs    Chemistry Recent Labs Lab 02/01/17 1649 02/01/17 1654 02/02/17 0926  NA 139 140 138  K 3.5 3.6 4.0  CL 104 105 109  CO2 24  --  19*  GLUCOSE 123* 131* 145*  BUN 7 7 5*  CREATININE 0.82 0.90 0.72  CALCIUM 8.6*  --  8.8*  PROT 5.9*  --  PENDING  ALBUMIN 3.6  --  3.3*  AST 30  --  113*  ALT 31  --  46  ALKPHOS 60  --  57  BILITOT 0.4  --  0.9  GFRNONAA >60  --  >60  GFRAA >60  --  >60    ANIONGAP 11  --  10     Hematology Recent Labs Lab 02/01/17 1649 02/01/17 1654 02/02/17 0926  WBC 8.1  --  9.8  RBC 4.95  --  4.86  HGB 15.1 14.6 15.1  HCT 44.3 43.0 43.8  MCV 89.5  --  90.1  MCH 30.5  --  31.1  MCHC 34.1  --  34.5  RDW 12.9  --  13.1  PLT 225  --  225    Cardiac Enzymes Recent Labs Lab 02/01/17 1649 02/01/17 1825 02/01/17 2350 02/02/17 0926  TROPONINI <0.03 12.55* 47.01* 21.55*    Recent Labs Lab 02/01/17 1653  TROPIPOC 0.00     BNP Recent Labs Lab 02/01/17 1825  BNP 26.7     DDimer No results for input(s): DDIMER in the last 168 hours.   Radiology    No results found.  Cardiac Studies  Patient Profile     61 y.o. male admitted with an AMI, s/p PCI, doing well.   Assessment & Plan    1. Acute inferolateral MI - he is doing well, s/p PCI/Stenting.  2. CAD - he has a tight residual 90% LAD lesion for which he is pending staged PCI tomorrow. 3. Peripheral vascular disease -s/p ruptured AAA with reduce lower extremity pulses 4. Ongoing tobacco abuse - the importance of smoking cessation has been reviewed.   Signed, Anthony Bunting, MD  02/02/2017, 10:44 AM  Patient ID: Anthony Coffey, male   DOB: 12-Sep-1956, 61 y.o.   MRN: [id number]

## 2017-02-03 ENCOUNTER — Inpatient Hospital Stay (HOSPITAL_COMMUNITY): Payer: Self-pay

## 2017-02-03 ENCOUNTER — Encounter (HOSPITAL_COMMUNITY)
Admission: EM | Disposition: A | Discharge status: Home / Self Care | Payer: Self-pay | Source: Home / Self Care | Attending: Interventional Cardiology

## 2017-02-03 ENCOUNTER — Encounter (HOSPITAL_COMMUNITY): Payer: Self-pay | Admitting: Interventional Cardiology

## 2017-02-03 DIAGNOSIS — I509 Heart failure, unspecified: Secondary | ICD-10-CM

## 2017-02-03 DIAGNOSIS — Z720 Tobacco use: Secondary | ICD-10-CM

## 2017-02-03 HISTORY — PX: CORONARY STENT INTERVENTION: CATH118234

## 2017-02-03 LAB — ECHOCARDIOGRAM COMPLETE
Ao-asc: 32 cm
FS: 26 % — AB (ref 28–44)
Height: 69 in
IV/PV OW: 0.93
LA ID, A-P, ES: 26 mm
LA diam end sys: 26 mm
LA diam index: 1.36 cm/m2
LA vol index: 15.4 mL/m2
LA vol: 29.5 mL
LAVOLA4C: 16.9 mL
LDCA: 3.14 cm2
LV PW d: 13.6 mm — AB (ref 0.6–1.1)
LV TDI E'LATERAL: 10.2
LV TDI E'MEDIAL: 6.85
LV e' LATERAL: 10.2 cm/s
LVOT VTI: 24 cm
LVOT peak grad rest: 6 mmHg
LVOTD: 20 mm
LVOTPV: 127 cm/s
LVOTSV: 75 mL
Lateral S' vel: 10.6 cm/s
PISA EROA: 0.06 cm2
RV TAPSE: 13.9 mm
S' Lateral: 4.79 cm/s
VTI: 228 cm
Weight: 2638.47 oz

## 2017-02-03 LAB — POCT I-STAT, CHEM 8
BUN: 6 mg/dL (ref 6–20)
Calcium, Ion: 1.13 mmol/L — ABNORMAL LOW (ref 1.15–1.40)
Chloride: 102 mmol/L (ref 101–111)
Creatinine, Ser: 0.7 mg/dL (ref 0.61–1.24)
GLUCOSE: 112 mg/dL — AB (ref 65–99)
HCT: 38 % — ABNORMAL LOW (ref 39.0–52.0)
HEMOGLOBIN: 12.9 g/dL — AB (ref 13.0–17.0)
POTASSIUM: 3 mmol/L — AB (ref 3.5–5.1)
Sodium: 136 mmol/L (ref 135–145)
TCO2: 21 mmol/L (ref 0–100)

## 2017-02-03 LAB — POCT ACTIVATED CLOTTING TIME
ACTIVATED CLOTTING TIME: 279 s
Activated Clotting Time: 285 s

## 2017-02-03 SURGERY — CORONARY STENT INTERVENTION
Anesthesia: LOCAL

## 2017-02-03 MED ORDER — HEPARIN SODIUM (PORCINE) 1000 UNIT/ML IJ SOLN
INTRAMUSCULAR | Status: AC
  Filled 2017-02-03: qty 1

## 2017-02-03 MED ORDER — HEPARIN SODIUM (PORCINE) 1000 UNIT/ML IJ SOLN
INTRAMUSCULAR | Status: DC | PRN
  Administered 2017-02-03: 8000 [IU] via INTRAVENOUS

## 2017-02-03 MED ORDER — FENTANYL CITRATE (PF) 100 MCG/2ML IJ SOLN
INTRAMUSCULAR | Status: DC | PRN
  Administered 2017-02-03: 25 ug via INTRAVENOUS

## 2017-02-03 MED ORDER — MIDAZOLAM HCL 2 MG/2ML IJ SOLN
INTRAMUSCULAR | Status: AC
  Filled 2017-02-03: qty 2

## 2017-02-03 MED ORDER — VERAPAMIL HCL 2.5 MG/ML IV SOLN
INTRAVENOUS | Status: AC
  Filled 2017-02-03: qty 2

## 2017-02-03 MED ORDER — SODIUM CHLORIDE 0.9% FLUSH: 3.0000 mL | INTRAVENOUS | Status: DC | PRN

## 2017-02-03 MED ORDER — ANGIOPLASTY BOOK
Freq: Once | Status: AC
  Administered 2017-02-03: 21:00:00
  Filled 2017-02-03: qty 1

## 2017-02-03 MED ORDER — NITROGLYCERIN 1 MG/10 ML FOR IR/CATH LAB
INTRA_ARTERIAL | Status: AC
  Filled 2017-02-03: qty 10

## 2017-02-03 MED ORDER — LABETALOL HCL 5 MG/ML IV SOLN: 10.0000 mg | INTRAVENOUS | Status: AC | PRN

## 2017-02-03 MED ORDER — HEPARIN (PORCINE) IN NACL 2-0.9 UNIT/ML-% IJ SOLN
INTRAMUSCULAR | Status: AC
  Filled 2017-02-03: qty 1000

## 2017-02-03 MED ORDER — ACTIVE PARTNERSHIP FOR HEALTH OF YOUR HEART BOOK
Freq: Once | Status: AC
Start: 2017-02-03 — End: 2017-02-03
  Administered 2017-02-03: 21:00:00
  Filled 2017-02-03: qty 1

## 2017-02-03 MED ORDER — HEPARIN (PORCINE) IN NACL 2-0.9 UNIT/ML-% IJ SOLN
INTRAMUSCULAR | Status: DC | PRN
  Administered 2017-02-03: 1000 mL

## 2017-02-03 MED ORDER — IOPAMIDOL (ISOVUE-370) INJECTION 76%
INTRAVENOUS | Status: DC | PRN
  Administered 2017-02-03: 35 mL via INTRA_ARTERIAL

## 2017-02-03 MED ORDER — SODIUM CHLORIDE 0.9 % IV SOLN: 250.0000 mL | INTRAVENOUS | Status: DC | PRN

## 2017-02-03 MED ORDER — LIDOCAINE HCL (PF) 1 % IJ SOLN
INTRAMUSCULAR | Status: AC
  Filled 2017-02-03: qty 30

## 2017-02-03 MED ORDER — METOPROLOL TARTRATE 12.5 MG HALF TABLET
12.5000 mg | ORAL_TABLET | Freq: Two times a day (BID) | ORAL | Status: DC
  Administered 2017-02-03 – 2017-02-04 (×3): 12.5 mg via ORAL
  Filled 2017-02-03 (×3): qty 1

## 2017-02-03 MED ORDER — SODIUM CHLORIDE 0.9 % WEIGHT BASED INFUSION
1.0000 mL/kg/h | INTRAVENOUS | Status: AC
  Administered 2017-02-03: 1 mL/kg/h via INTRAVENOUS

## 2017-02-03 MED ORDER — SODIUM CHLORIDE 0.9% FLUSH: 3.0000 mL | Freq: Two times a day (BID) | INTRAVENOUS | Status: DC

## 2017-02-03 MED ORDER — NITROGLYCERIN 1 MG/10 ML FOR IR/CATH LAB
INTRA_ARTERIAL | Status: DC | PRN
  Administered 2017-02-03 (×2): 150 ug via INTRACORONARY

## 2017-02-03 MED ORDER — LIDOCAINE HCL (PF) 1 % IJ SOLN
INTRAMUSCULAR | Status: DC | PRN
  Administered 2017-02-03: 2 mL via SUBCUTANEOUS

## 2017-02-03 MED ORDER — HYDRALAZINE HCL 20 MG/ML IJ SOLN
5.0000 mg | INTRAMUSCULAR | Status: AC | PRN
  Administered 2017-02-03: 5 mg via INTRAVENOUS
  Filled 2017-02-03: qty 1

## 2017-02-03 MED ORDER — MIDAZOLAM HCL 2 MG/2ML IJ SOLN
INTRAMUSCULAR | Status: DC | PRN
  Administered 2017-02-03: 2 mg via INTRAVENOUS

## 2017-02-03 MED ORDER — HEART ATTACK BOUNCING BOOK
Freq: Once | Status: AC
  Administered 2017-02-03: 21:00:00
  Filled 2017-02-03: qty 1

## 2017-02-03 MED ORDER — VERAPAMIL HCL 2.5 MG/ML IV SOLN
INTRAVENOUS | Status: DC | PRN
  Administered 2017-02-03: 10 mL via INTRA_ARTERIAL

## 2017-02-03 MED ORDER — FENTANYL CITRATE (PF) 100 MCG/2ML IJ SOLN
INTRAMUSCULAR | Status: AC
  Filled 2017-02-03: qty 2

## 2017-02-03 SURGICAL SUPPLY — 15 items
BALLN ~~LOC~~ EMERGE MR 2.75X8 (BALLOONS) ×2
BALLOON ~~LOC~~ EMERGE MR 2.75X8 (BALLOONS) IMPLANT
CATH VISTA GUIDE 6FR XBLAD3.5 (CATHETERS) ×1 IMPLANT
DEVICE RAD COMP TR BAND LRG (VASCULAR PRODUCTS) ×1 IMPLANT
GLIDESHEATH SLEND SS 6F .021 (SHEATH) ×1 IMPLANT
GUIDEWIRE INQWIRE 1.5J.035X260 (WIRE) IMPLANT
INQWIRE 1.5J .035X260CM (WIRE) ×2
KIT ENCORE 26 ADVANTAGE (KITS) ×2 IMPLANT
KIT HEART LEFT (KITS) ×2 IMPLANT
PACK CARDIAC CATHETERIZATION (CUSTOM PROCEDURE TRAY) ×2 IMPLANT
STENT PROMUS PREM MR 2.5X12 (Permanent Stent) ×1 IMPLANT
TRANSDUCER W/STOPCOCK (MISCELLANEOUS) ×2 IMPLANT
TUBING CIL FLEX 10 FLL-RA (TUBING) ×2 IMPLANT
WIRE COUGAR XT STRL 190CM (WIRE) IMPLANT
WIRE MARVEL STR TIP 190CM (WIRE) ×1 IMPLANT

## 2017-02-03 NOTE — Discharge Summary (Signed)
Discharge Summary    Patient ID: Anthony Coffey,  MRN: [id number], DOB/AGE: [birth year] 59 y.o.  Admit date: 02/01/2017 Discharge date: 02/04/2017  Primary Care Provider: No Pcp Per Pt (Inactive) Primary Cardiologist: Katrinka Blazing  Discharge Diagnoses    Active Problems:   ST elevation myocardial infarction (STEMI) [facility])   Acute myocardial infarction   Hyperlipidemia   Tobacco abuse   Allergies No Known Allergies  Diagnostic Studies/Procedures    LHC: 02/01/17  Conclusion    Acute inferolateral infarction due to occlusion of the second obtuse marginal which is a large vessel that runs along the inferolateral wall.  100% second obtuse marginal reduced to 0% with TIMI grade 3 flow after angioplasty and stenting using a 2.5 x 16 mm Synergy DES.  90% mid LAD stenosis  Widely patent left main and small nondominant RCA.  Normal LV function with EDP 23 mmHg. LVEF 55%   RECOMMENDATIONS:   High intensity statin therapy  Dual antiplatelet therapy with aspirin and Brilinta  IV hydration as tolerated  Chest x-ray should be performed in this smoker who has not had consistent healthcare for greater than 10 years.  Staged LAD intervention on Monday. Will need orders written  yo male with PMH of HTN, Anxiety and COPD who presented with chest pain that started around 1:30pm on the day of admission and resolved. Then recurred around 3:45pm and was severe. Called EMS, and STEMI was activated. EKG showed diffuse ST elevation in the inferior leads. Pain was a 5/10 on arrival. Reported having a AAA repaired about 11 years prior by Dr. Hart Rochester. He was taken emergently to the cath lab by Dr. Katrinka Blazing.   Hospital Course     Consultants: None    He underwent LHC showing 100% 2nd OM with TIMI grade 3 following angioplasty with DES x1, and 90% mLAD with widely patent nondominant RCA. Troponin peaked at 47.01. EF noted 55% with normal LVEDP. Underwent staged mLAD on 02/03/17 with DES x1. Plan for DAPT with ASA/Brilinta. Asa and statin therapy were also initiated. He did well post cath. No chest pain or dyspnea. Did have a hematoma at the right radial cath site but improved with manual pressure.  Smaller in size the following morning, and denied any pain. He was started on Chantix by Dr. Ladona Ridgel in efforts to stop smoking, this prescription was continued at discharge.   His labs were stable following cath. Echo 50-55% no WMA and G1DD. He worked with cardiac rehab without any complaints. Was seen by Dr. Allyson Sabal and determined stable for discharge home. Follow up has been arranged. Does  not have a PCP, but plans to follow up at the [facility] center. His medications were printed at the time of discharge, and was given a 30 day Brilinta card.   Physical Exam:   GEN:No acute distress.   Neck:No JVD Cardiac:RRR, no murmurs, rubs, or gallops.  Respiratory:Clear to auscultation bilaterally. ZO:XWRU, nontender, non-distended  MS:No edema; No deformity. Right radial site with small hematoma and bruising. No bruit.  Neuro:Nonfocal  Psych: Normal affect  _____________  Discharge Vitals Blood pressure 135/76, pulse 68, temperature 98.8 F (37.1 C), temperature source Oral, resp. rate 20, height  (1.753 m), weight 180 lb (81.6 kg), SpO2 95 %.  Filed Weights   02/01/17 1650 02/04/17 0641  Weight: 164 lb 14.5 oz (74.8 kg) 180 lb (81.6 kg)    Labs & Radiologic Studies    CBC  Recent Labs  02/01/17 1649  02/02/17 0926 02/04/17 0348  WBC 8.1  --  9.8 10.8*  NEUTROABS 3.8  --   --   --   HGB 15.1  < > 15.1 15.2  HCT 44.3  < > 43.8 44.9  MCV 89.5  --  90.1 89.8  PLT 225  --  225 249  < > = values in this interval not displayed. Basic Metabolic Panel  Recent Labs  02/02/17 0926 02/04/17 0348  NA 138 140  K 4.0 3.6  CL 109 109  CO2 19* 20*  GLUCOSE 145* 98  BUN 5* 6  CREATININE 0.72 0.71  CALCIUM 8.8* 8.9   Liver Function Tests  Recent Labs  02/01/17 1649 02/02/17 0926  AST 30 113*  ALT 31 46  ALKPHOS 60 57  BILITOT 0.4 0.9  PROT 5.9* 5.9*  ALBUMIN 3.6 3.3*   No results for input(s): LIPASE, AMYLASE in the last 72 hours. Cardiac Enzymes  Recent Labs  02/01/17 1825 02/01/17 2350 02/02/17 0926  TROPONINI 12.55* 47.01* 21.55*   BNP Invalid input(s): POCBNP D-Dimer No results for input(s): DDIMER in the last 72 hours. Hemoglobin A1C  Recent Labs  02/03/17 1051  HGBA1C 5.5   Fasting Lipid Panel  Recent Labs  02/01/17 1825  CHOL 320*  HDL 27*  LDLCALC UNABLE TO CALCULATE IF TRIGLYCERIDE OVER 400 mg/dL  TRIG 045*  CHOLHDL  40.9   Thyroid Function Tests No results for input(s): TSH, T4TOTAL, T3FREE, THYROIDAB in the last 72 hours.  Invalid input(s): FREET3 _____________  No results found. Disposition   Pt is being discharged home today in good condition.  Follow-up Plans & Appointments    Follow-up Information    [facility] Follow up.   Why:  please call to make hospital follow up apt, and take patient asistance application with you to apt. Contact information: [address] [place] [address] [phone]       Nada Boozer, NP Follow up on 02/10/2017.   Specialties:  Cardiology, Radiology Why:  at 2:00pm for your follow up appt.  Contact information: [address] [place] [address] [phone]  Discharge Instructions    Amb Referral to Cardiac Rehabilitation    Complete by:  As directed    Diagnosis:   Coronary Stents STEMI     Call MD for:  redness, tenderness, or signs of infection (pain, swelling, redness, odor or green/yellow discharge around incision site)    Complete by:  As directed    Diet - low sodium heart healthy    Complete by:  As directed    Discharge instructions    Complete by:  As directed    Radial Site Care Refer to this sheet in the next few weeks. These instructions provide you with information on caring for yourself after your procedure. Your caregiver may also give you more specific instructions. Your treatment has been planned according to current medical practices, but problems sometimes occur. Call your caregiver if you have any problems or questions after your procedure. HOME CARE INSTRUCTIONS You may shower the day after the procedure.Remove the bandage (dressing) and gently wash the site with plain soap and water.Gently pat the site dry.  Do not apply powder or lotion to the site.  Do not submerge the affected site in water for 3 to 5 days.  Inspect the site at least twice  daily.  Do not flex or bend the affected arm for 24 hours.  No lifting over 5 pounds (2.3 kg) for 5 days after your procedure.  Do not drive home if you are discharged the same day of the procedure. Have someone else drive you.  You may drive 24 hours after the procedure unless otherwise instructed by your caregiver.  What to expect: Any bruising will usually fade within 1 to 2 weeks.  Blood that collects in the tissue (hematoma) may be painful to the touch. It should usually decrease in size and tenderness within 1 to 2 weeks.  SEEK IMMEDIATE MEDICAL CARE IF: You have unusual pain at the radial site.  You have redness, warmth, swelling, or pain at the radial site.  You have drainage (other than a small amount of blood on the dressing).  You have chills.  You have a fever or persistent symptoms for more than 72 hours.  You have a fever and your symptoms suddenly get worse.  Your arm becomes pale, cool, tingly, or numb.  You have heavy bleeding from the site. Hold pressure on the site.   Increase activity slowly    Complete by:  As directed       Discharge Medications   Current Discharge Medication List    START taking these medications   Details  aspirin 81 MG chewable tablet Chew 1 tablet (81 mg total) by mouth daily.    atorvastatin (LIPITOR) 80 MG tablet Take 1 tablet (80 mg total) by mouth daily at 6 PM. Qty: 30 tablet, Refills: 6    metoprolol tartrate (LOPRESSOR) 25 MG tablet Take 0.5 tablets (12.5 mg total) by mouth 2 (two) times daily. Qty: 60 tablet, Refills: 3    nitroGLYCERIN (NITROSTAT) 0.4 MG SL tablet Place 1 tablet (0.4 mg total) under the tongue every 5 (five) minutes as needed. Qty: 25 tablet, Refills: 3    ticagrelor (BRILINTA) 90 MG TABS tablet Take 1 tablet (90 mg total) by mouth 2 (two) times daily. Qty: 60 tablet, Refills: 10    varenicline (CHANTIX) 1 MG tablet Take 1 tablet (1 mg total) by mouth daily. Qty: 30 tablet, Refills: 0      CONTINUE  these medications which have CHANGED  Details  acetaminophen (TYLENOL) 500 MG tablet Take 1 tablet (500 mg total) by mouth every 6 (six) hours as needed for headache (pain). Qty: 30 tablet, Refills: 0      STOP taking these medications     cloNIDine (CATAPRES) 0.2 MG tablet      LORazepam (ATIVAN) 0.5 MG tablet      LORazepam (ATIVAN) 1 MG tablet      oxyCODONE-acetaminophen (PERCOCET) 10-325 MG per tablet          Aspirin prescribed at discharge?  Yes High Intensity Statin Prescribed? (Lipitor 40-80mg  or Crestor 20-40mg ): Yes Beta Blocker Prescribed? Yes For EF <40%, was ACEI/ARB Prescribed? No: EF ok, consider in the outpatient setting if blood pressure allows ADP Receptor Inhibitor Prescribed? (i.e. Plavix etc.-Includes Medically Managed Patients): Yes For EF <40%, Aldosterone Inhibitor Prescribed? No: EF ok Was EF assessed during THIS hospitalization? Yes Was Cardiac Rehab II ordered? (Included Medically managed Patients): Yes   Outstanding Labs/Studies   FLP/LFTs in 6 weeks  Duration of Discharge Encounter   Greater than 30 minutes including physician time.  Signed, Laverda Page NP-C 02/04/2017, 8:53 AM   Agree with note by Laverda Page NP-C  Okay for discharge today status post initial circumflex obtuse marginal branch intervention in the setting of the lateral STEMI by Dr. Katrinka Blazing on 02/01/17 with staged mid LAD intervention by Dr. Excell Seltzer yesterday. He is on appropriate medications. He is pain-free. He will follow-up with Dr. Katrinka Blazing as an outpatient.  Runell Gess, M.D., FACP, [facility], [facility] [address] [place], [place]  [address]  [phone] 02/04/2017 10:44 AM

## 2017-02-03 NOTE — Progress Notes (Signed)
  Echocardiogram 2D Echocardiogram has been performed.  Anthony Coffey 02/03/2017, 3:51 PM

## 2017-02-03 NOTE — H&P (View-Only) (Signed)
Progress Note  Patient Name: Anthony Coffey Date of Encounter: 02/03/2017  Primary Cardiologist: Anthony Coffey  Subjective   Postop day 2 lateral STEMI she with PCI and stenting of the second obtuse marginal branch by Dr. Katrinka Coffey. His peak troponin was 47. His EF was preserved. He has residual high-grade mid LAD disease for staged intervention today. He has had no recurrent chest pain.  Inpatient Medications    Scheduled Meds: . aspirin  81 mg Oral Daily  . atorvastatin  80 mg Oral q1800  . heparin  5,000 Units Subcutaneous Q8H  . sodium chloride flush  3 mL Intravenous Q12H  . sodium chloride flush  3 mL Intravenous Q12H  . ticagrelor  90 mg Oral BID  . varenicline  1 mg Oral Daily   Continuous Infusions: . sodium chloride 1 mL/kg/hr (02/03/17 0700)   PRN Meds: sodium chloride, sodium chloride, acetaminophen, morphine injection, ondansetron (ZOFRAN) IV, oxyCODONE-acetaminophen, sodium chloride flush, sodium chloride flush   Vital Signs    Vitals:   02/03/17 0600 02/03/17 0700 02/03/17 0800 02/03/17 0853  BP: 137/76  124/80   Pulse: (!) 58 (!) 57 (!) 58   Resp: Temp:    97.9 F (36.6 C)  TempSrc:    Oral  SpO2: 94% 95% 96%   Weight:      Height:        Intake/Output Summary (Last 24 hours) at 02/03/17 1027 Last data filed at 02/03/17 0956  Gross per 24 hour  Intake            539.2 ml  Output             1385 ml  Net           -845.8 ml   Filed Weights   02/01/17 1650  Weight: 164 lb 14.5 oz (74.8 kg)    Telemetry    Normal sinus rhythm in the 60s - Personally Reviewed  ECG    Not performed today - Personally Reviewed  Physical Exam   GEN: No acute distress.   Neck: No JVD Cardiac: RRR, no murmurs, rubs, or gallops.  Respiratory: Clear to auscultation bilaterally. GI: Soft, nontender, non-distended  MS: No edema; No deformity. Neuro:  Nonfocal  Psych: Normal affect   Labs    Chemistry Recent Labs Lab 02/01/17 1649 02/01/17 1654  02/02/17 0926  NA 139 140 138  K 3.5 3.6 4.0  CL 104 105 109  CO2 24  --  19*  GLUCOSE 123* 131* 145*  BUN 7 7 5*  CREATININE 0.82 0.90 0.72  CALCIUM 8.6*  --  8.8*  PROT 5.9*  --  5.9*  ALBUMIN 3.6  --  3.3*  AST 30  --  113*  ALT 31  --  46  ALKPHOS 60  --  57  BILITOT 0.4  --  0.9  GFRNONAA >60  --  >60  GFRAA >60  --  >60  ANIONGAP 11  --  10     Hematology Recent Labs Lab 02/01/17 1649 02/01/17 1654 02/02/17 0926  WBC 8.1  --  9.8  RBC 4.95  --  4.86  HGB 15.1 14.6 15.1  HCT 44.3 43.0 43.8  MCV 89.5  --  90.1  MCH 30.5  --  31.1  MCHC 34.1  --  34.5  RDW 12.9  --  13.1  PLT 225  --  225    Cardiac Enzymes Recent Labs Lab 02/01/17 1649 02/01/17  1825 02/01/17 2350 02/02/17 0926  TROPONINI <0.03 12.55* 47.01* 21.55*    Recent Labs Lab 02/01/17 1653  TROPIPOC 0.00     BNP Recent Labs Lab 02/01/17 1825  BNP 26.7     DDimer No results for input(s): DDIMER in the last 168 hours.   Radiology    No results found.  Cardiac Studies   Cath-  Conclusion    Acute inferolateral infarction due to occlusion of the second obtuse marginal which is a large vessel that runs along the inferolateral wall.  100% second obtuse marginal reduced to 0% with TIMI grade 3 flow after angioplasty and stenting using a 2.5 x 16 mm Synergy DES.  90% mid LAD stenosis  Widely patent left main and small nondominant RCA.  Normal LV function with EDP 23 mmHg. LVEF 55%   RECOMMENDATIONS:   High intensity statin therapy  Dual antiplatelet therapy with aspirin and Brilinta  IV hydration as tolerated  Chest x-ray should be performed in this smoker who has not had consistent healthcare for greater than 10 years.  Staged LAD intervention on Monday. Will need orders written  y.o. male divorced Caucasian male father of one son who has no prior cardiac history. He does smoke a pack a day and  have hypertension. He had a lateral STEMI on Saturday underwent acute intervention by Dr. Katrinka Coffey recommended approach. He had occluded second obtuse marginal branch which was stented with a Synergy drug-eluting stent. Residual high-grade mid LAD disease with preserved LV function. He is on dual antibiotic therapy. He's had no recurrent chest pain. He is scheduled for staged LAD intervention today.  Assessment & Plan    1: Coronary artery disease-history of lateral STEMI secondary to an occluded second obtuse marginal branch 02/01/17 with PCI and drug-eluting stenting. He has residual mid high-grade LAD disease scheduled for staged intervention today. He is on dual antibiotic therapy. The troponin was 47. His LV function was preserved.  2: Hyperlipidemia-total cholesterol measured during his hospitalization was 320 with an internist for level of 852. He is on high-dose atorvastatin. He'll need dietary counseling.  3: Tobacco abuse-history of long-term tobacco abuse with COPD smoking one pack per day. We will counsel him about smoking cessation  4: Peripheral arterial disease-history of ruptured abdominal aortic aneurysm surgically treated 11 years ago by Dr. Hart Rochester  5: Hypertension-history of hypertension blood pressure measurements at 137/79. We will start low-dose beta blocker. His heart rate is in the 50s and 60s which will need to be clear closely monitored.    Anthony Sias, MD  02/03/2017, 10:27 AM

## 2017-02-03 NOTE — Progress Notes (Signed)
 Progress Note  Patient Name: Anthony Coffey Date of Encounter: 02/03/2017  Primary Cardiologist: Smith  Subjective   Postop day 2 lateral STEMI she with PCI and stenting of the second obtuse marginal branch by Dr. Smith. His peak troponin was 47. His EF was preserved. He has residual high-grade mid LAD disease for staged intervention today. He has had no recurrent chest pain.  Inpatient Medications    Scheduled Meds: . aspirin  81 mg Oral Daily  . atorvastatin  80 mg Oral q1800  . heparin  5,000 Units Subcutaneous Q8H  . sodium chloride flush  3 mL Intravenous Q12H  . sodium chloride flush  3 mL Intravenous Q12H  . ticagrelor  90 mg Oral BID  . varenicline  1 mg Oral Daily   Continuous Infusions: . sodium chloride 1 mL/kg/hr (02/03/17 0700)   PRN Meds: sodium chloride, sodium chloride, acetaminophen, morphine injection, ondansetron (ZOFRAN) IV, oxyCODONE-acetaminophen, sodium chloride flush, sodium chloride flush   Vital Signs    Vitals:   02/03/17 0600 02/03/17 0700 02/03/17 0800 02/03/17 0853  BP: 137/76  124/80   Pulse: (!) 58 (!) 57 (!) 58   Resp: 18 15 15   Temp:    97.9 F (36.6 C)  TempSrc:    Oral  SpO2: 94% 95% 96%   Weight:      Height:        Intake/Output Summary (Last 24 hours) at 02/03/17 1027 Last data filed at 02/03/17 0956  Gross per 24 hour  Intake            539.2 ml  Output             1385 ml  Net           -845.8 ml   Filed Weights   02/01/17 1650  Weight: 164 lb 14.5 oz (74.8 kg)    Telemetry    Normal sinus rhythm in the 60s - Personally Reviewed  ECG    Not performed today - Personally Reviewed  Physical Exam   GEN: No acute distress.   Neck: No JVD Cardiac: RRR, no murmurs, rubs, or gallops.  Respiratory: Clear to auscultation bilaterally. GI: Soft, nontender, non-distended  MS: No edema; No deformity. Neuro:  Nonfocal  Psych: Normal affect   Labs    Chemistry Recent Labs Lab 02/01/17 1649 02/01/17 1654  02/02/17 0926  NA 139 140 138  K 3.5 3.6 4.0  CL 104 105 109  CO2 24  --  19*  GLUCOSE 123* 131* 145*  BUN 7 7 5*  CREATININE 0.82 0.90 0.72  CALCIUM 8.6*  --  8.8*  PROT 5.9*  --  5.9*  ALBUMIN 3.6  --  3.3*  AST 30  --  113*  ALT 31  --  46  ALKPHOS 60  --  57  BILITOT 0.4  --  0.9  GFRNONAA >60  --  >60  GFRAA >60  --  >60  ANIONGAP 11  --  10     Hematology Recent Labs Lab 02/01/17 1649 02/01/17 1654 02/02/17 0926  WBC 8.1  --  9.8  RBC 4.95  --  4.86  HGB 15.1 14.6 15.1  HCT 44.3 43.0 43.8  MCV 89.5  --  90.1  MCH 30.5  --  31.1  MCHC 34.1  --  34.5  RDW 12.9  --  13.1  PLT 225  --  225    Cardiac Enzymes Recent Labs Lab 02/01/17 1649 02/01/17   1825 02/01/17 2350 02/02/17 0926  TROPONINI <0.03 12.55* 47.01* 21.55*    Recent Labs Lab 02/01/17 1653  TROPIPOC 0.00     BNP Recent Labs Lab 02/01/17 1825  BNP 26.7     DDimer No results for input(s): DDIMER in the last 168 hours.   Radiology    No results found.  Cardiac Studies   Cath-  Conclusion    Acute inferolateral infarction due to occlusion of the second obtuse marginal which is a large vessel that runs along the inferolateral wall.  100% second obtuse marginal reduced to 0% with TIMI grade 3 flow after angioplasty and stenting using a 2.5 x 16 mm Synergy DES.  90% mid LAD stenosis  Widely patent left main and small nondominant RCA.  Normal LV function with EDP 23 mmHg. LVEF 55%   RECOMMENDATIONS:   High intensity statin therapy  Dual antiplatelet therapy with aspirin and Brilinta  IV hydration as tolerated  Chest x-ray should be performed in this smoker who has not had consistent healthcare for greater than 10 years.  Staged LAD intervention on Monday. Will need orders written  y.o. male divorced Caucasian male father of one son who has no prior cardiac history. He does smoke a pack a day and  have hypertension. He had a lateral STEMI on Saturday underwent acute intervention by Dr. Katrinka Blazing recommended approach. He had occluded second obtuse marginal branch which was stented with a Synergy drug-eluting stent. Residual high-grade mid LAD disease with preserved LV function. He is on dual antibiotic therapy. He's had no recurrent chest pain. He is scheduled for staged LAD intervention today.  Assessment & Plan    1: Coronary artery disease-history of lateral STEMI secondary to an occluded second obtuse marginal branch 02/01/17 with PCI and drug-eluting stenting. He has residual mid high-grade LAD disease scheduled for staged intervention today. He is on dual antibiotic therapy. The troponin was 47. His LV function was preserved.  2: Hyperlipidemia-total cholesterol measured during his hospitalization was 320 with an internist for level of 852. He is on high-dose atorvastatin. He'll need dietary counseling.  3: Tobacco abuse-history of long-term tobacco abuse with COPD smoking one pack per day. We will counsel him about smoking cessation  4: Peripheral arterial disease-history of ruptured abdominal aortic aneurysm surgically treated 11 years ago by Dr. Hart Rochester  5: Hypertension-history of hypertension blood pressure measurements at 137/79. We will start low-dose beta blocker. His heart rate is in the 50s and 60s which will need to be clear closely monitored.    [name], MD  02/03/2017, 10:27 AM

## 2017-02-03 NOTE — Significant Event (Signed)
Patient has been transported to [facility] from cath lab. Report given. No personal belongings in [phone], to take to new room. Family aware and updated.   Alysia Scism

## 2017-02-03 NOTE — Care Management Note (Signed)
Case Management Note  Patient Details  Name: Anthony Coffey MRN: [id number] Date of Birth: Sep 17, 1956  Subjective/Objective:     s/p coronary stent intervention, will be on brilinta, patient has no insurance or no pcp, NCM will give patient the 30 day savings card and the patient assist application for brilinta and will give patient information to follow up at the [facility] clinic so he can get medication assistance with the brilinta.                Action/Plan:   Expected Discharge Date:  02/06/17               Expected Discharge Plan:  Home/Self Care  In-House Referral:     Discharge planning Services  CM Consult, Indigent Health Clinic, Medication Assistance  Post Acute Care Choice:    Choice offered to:     DME Arranged:    DME Agency:     HH Arranged:    HH Agency:     Status of Service:  In process, will continue to follow  If discussed at Long Length of Stay Meetings, dates discussed:    Additional Comments:  Leone Haven, RN 02/03/2017, 3:06 PM

## 2017-02-03 NOTE — Interval H&P Note (Signed)
History and Physical Interval Note:  02/03/2017 1:41 PM Cath Lab Visit (complete for each Cath Lab visit)  Clinical Evaluation Leading to the Procedure:   ACS: Yes.    Non-ACS:    Anginal Classification: CCS IV  Anti-ischemic medical therapy: Minimal Therapy (1 class of medications)  Non-Invasive Test Results: No non-invasive testing performed  Prior CABG: No previous CABG        [name]  has presented today for surgery, with the diagnosis of CAD  The various methods of treatment have been discussed with the patient and family. After consideration of risks, benefits and other options for treatment, the patient has consented to  Procedure(s): Coronary Stent Intervention (N/A) as a surgical intervention .  The patient's history has been reviewed, patient examined, no change in status, stable for surgery.  I have reviewed the patient's chart and labs.  Questions were answered to the patient's satisfaction.     Tonny Bollman

## 2017-02-03 NOTE — Progress Notes (Signed)
CARDIAC REHAB PHASE I   PRE:  Rate/Rhythm: 69 SR  BP:  Sitting: 124/80        SaO2: 96 RA  MODE:  Ambulation: 350 ft   POST:  Rate/Rhythm: 77 SR  BP:  Sitting: 142/80         SaO2: 96 RA  Pt ambulated 350 ft on RA, IV, assist x1, steady gait, tolerated well with no complaints. Began MI/stent education.  Reviewed risk factors, tobacco cessation (gave pt fake cigarette), MI book, anti-platelet therapy, stent card, activity restrictions, ntg, and phase 2 cardiac rehab. Left heart healthy diet handout for pt to review. Pt verbalized understanding, receptive to education. Pt agrees to phase 2 cardiac rehab referral, will send to [facility]. Pt does not have insurance, on brilinta post PCI, also does not have PCP. Pt will need to see case manager prior to discharge for assistance with medications and to establish PCP. Pt to recliner after walk, call bell within reach. Will follow up tomorrow.       [phone] Joylene Grapes, RN, BSN 02/03/2017 8:59 AM

## 2017-02-04 ENCOUNTER — Telehealth: Payer: Self-pay

## 2017-02-04 DIAGNOSIS — E785 Hyperlipidemia, unspecified: Secondary | ICD-10-CM

## 2017-02-04 DIAGNOSIS — Z720 Tobacco use: Secondary | ICD-10-CM

## 2017-02-04 LAB — BASIC METABOLIC PANEL
ANION GAP: 11 (ref 5–15)
BUN: 6 mg/dL (ref 6–20)
CHLORIDE: 109 mmol/L (ref 101–111)
CO2: 20 mmol/L — AB (ref 22–32)
Calcium: 8.9 mg/dL (ref 8.9–10.3)
Creatinine, Ser: 0.71 mg/dL (ref 0.61–1.24)
GFR calc Af Amer: >60 mL/min (ref >60)
GLUCOSE: 98 mg/dL (ref 65–99)
POTASSIUM: 3.6 mmol/L (ref 3.5–5.1)
SODIUM: 140 mmol/L (ref 135–145)

## 2017-02-04 LAB — HEMOGLOBIN A1C
Hgb A1c MFr Bld: 5.5 % (ref 4.8–5.6)
Mean Plasma Glucose: 111 mg/dL

## 2017-02-04 LAB — CBC
HEMATOCRIT: 44.9 % (ref 39.0–52.0)
HEMOGLOBIN: 15.2 g/dL (ref 13.0–17.0)
MCH: 30.4 pg (ref 26.0–34.0)
MCHC: 33.9 g/dL (ref 30.0–36.0)
MCV: 89.8 fL (ref 78.0–100.0)
Platelets: 249 10*3/uL (ref 150–400)
RBC: 5 MIL/uL (ref 4.22–5.81)
RDW: 13.2 % (ref 11.5–15.5)
WBC: 10.8 10*3/uL — ABNORMAL HIGH (ref 4.0–10.5)

## 2017-02-04 MED ORDER — NITROGLYCERIN 0.4 MG SL SUBL: 0.4000 mg | SUBLINGUAL_TABLET | SUBLINGUAL | 3 refills | Status: DC | PRN

## 2017-02-04 MED ORDER — ACETAMINOPHEN 500 MG PO TABS: 500.0000 mg | ORAL_TABLET | Freq: Four times a day (QID) | ORAL | 0 refills | Status: AC | PRN

## 2017-02-04 MED ORDER — METOPROLOL TARTRATE 25 MG PO TABS: 12.5000 mg | ORAL_TABLET | Freq: Two times a day (BID) | ORAL | 3 refills | Status: DC

## 2017-02-04 MED ORDER — ASPIRIN 81 MG PO CHEW: 81.0000 mg | CHEWABLE_TABLET | Freq: Every day | ORAL | Status: DC

## 2017-02-04 MED ORDER — TICAGRELOR 90 MG PO TABS: 90.0000 mg | ORAL_TABLET | Freq: Two times a day (BID) | ORAL | 10 refills | Status: DC

## 2017-02-04 MED ORDER — VARENICLINE TARTRATE 1 MG PO TABS: 1.0000 mg | ORAL_TABLET | Freq: Every day | ORAL | 0 refills | Status: DC

## 2017-02-04 MED ORDER — ATORVASTATIN CALCIUM 80 MG PO TABS: 80.0000 mg | ORAL_TABLET | Freq: Every day | ORAL | 6 refills | Status: DC

## 2017-02-04 MED FILL — BRILINTA 90 MG TABLET: 90 | 30 days supply | Qty: 60 | Fill #0

## 2017-02-04 MED FILL — !CHANTIX 1 MG TABLET: 1 | 30 days supply | Qty: 30 | Fill #0

## 2017-02-04 MED FILL — METOPROLOL TARTRATE 25 MG T: 25 | 30 days supply | Qty: 30 | Fill #0

## 2017-02-04 MED FILL — NITROSTAT 0.4 MG TABLET SL: 0.4 | 5 days supply | Qty: 25 | Fill #0

## 2017-02-04 MED FILL — ATORVASTATIN 80 MG TABLET: 80 | 30 days supply | Qty: 30 | Fill #0

## 2017-02-04 NOTE — Telephone Encounter (Signed)
Patient has TCM appointment with Reggy Eye NP on 02/10/17 at 2:00 pm.

## 2017-02-04 NOTE — Telephone Encounter (Signed)
-----   Message from Dewain Penning sent at 02/04/2017  8:50 AM EDT ----- Regarding: TCM  Pt needs call   Thanks Rosann Auerbach

## 2017-02-04 NOTE — Progress Notes (Signed)
CARDIAC REHAB PHASE I   PRE:  Rate/Rhythm: 69 SR  BP:  Sitting: 135/76        SaO2: 95 RA  MODE:  Ambulation: 800 ft   POST:  Rate/Rhythm: 80 SR  BP:  Sitting: 148/63         SaO2: 99 RA  Pt ambulated 800 ft on RA, independent, steady gait, tolerated well with no complaints. Pt states he has had some intermittent shortness of breath, which sounds like a possible side effect of the brilinta. Completed MI/stent education, reinforced yesterday's education, reviewed exercise guidelines, heart healthy diet. Pt verbalized understanding. Phase 2 referral sent to [facility]. Pt to bed per pt request after walk, call bell within reach.  [id number] Joylene Grapes, RN, BSN 02/04/2017 8:50 AM

## 2017-02-05 NOTE — Telephone Encounter (Signed)
Patient contacted regarding discharge from [facility] on 02/04/2017.  Patient understands to follow up with provider Nada Boozer on 4/16 at 2 pm at [facility] office. Patient understands discharge instructions? yes Patient understands medications and regiment? yes Patient understands to bring all medications to this visit? yes  Reviewed appt date/time.  Requested he bring all medications with him and he wrote that down.  Denied any questions or needs at this time.

## 2017-02-06 ENCOUNTER — Encounter: Payer: Self-pay | Admitting: Physician Assistant

## 2017-02-06 ENCOUNTER — Ambulatory Visit: Payer: Self-pay | Attending: Internal Medicine | Admitting: Physician Assistant

## 2017-02-06 VITALS — BP 165/99 | HR 63 | Temp 98.1°F | Resp 16 | Wt 184.2 lb

## 2017-02-06 DIAGNOSIS — I10 Essential (primary) hypertension: Secondary | ICD-10-CM | POA: Insufficient documentation

## 2017-02-06 DIAGNOSIS — Z9889 Other specified postprocedural states: Secondary | ICD-10-CM | POA: Insufficient documentation

## 2017-02-06 DIAGNOSIS — F419 Anxiety disorder, unspecified: Secondary | ICD-10-CM | POA: Insufficient documentation

## 2017-02-06 DIAGNOSIS — I2510 Atherosclerotic heart disease of native coronary artery without angina pectoris: Secondary | ICD-10-CM | POA: Insufficient documentation

## 2017-02-06 DIAGNOSIS — G4700 Insomnia, unspecified: Secondary | ICD-10-CM | POA: Insufficient documentation

## 2017-02-06 DIAGNOSIS — J449 Chronic obstructive pulmonary disease, unspecified: Secondary | ICD-10-CM | POA: Insufficient documentation

## 2017-02-06 DIAGNOSIS — I2102 ST elevation (STEMI) myocardial infarction involving left anterior descending coronary artery: Secondary | ICD-10-CM | POA: Insufficient documentation

## 2017-02-06 DIAGNOSIS — Z8679 Personal history of other diseases of the circulatory system: Secondary | ICD-10-CM | POA: Insufficient documentation

## 2017-02-06 DIAGNOSIS — Z720 Tobacco use: Secondary | ICD-10-CM

## 2017-02-06 DIAGNOSIS — I252 Old myocardial infarction: Secondary | ICD-10-CM | POA: Insufficient documentation

## 2017-02-06 DIAGNOSIS — E7800 Pure hypercholesterolemia, unspecified: Secondary | ICD-10-CM | POA: Insufficient documentation

## 2017-02-06 DIAGNOSIS — Z7982 Long term (current) use of aspirin: Secondary | ICD-10-CM | POA: Insufficient documentation

## 2017-02-06 DIAGNOSIS — Z955 Presence of coronary angioplasty implant and graft: Secondary | ICD-10-CM | POA: Insufficient documentation

## 2017-02-06 DIAGNOSIS — F5101 Primary insomnia: Secondary | ICD-10-CM

## 2017-02-06 MED ORDER — TRAZODONE HCL 50 MG PO TABS: 25.0000 mg | ORAL_TABLET | Freq: Every evening | ORAL | 3 refills | Status: DC | PRN

## 2017-02-06 MED ORDER — METOPROLOL TARTRATE 25 MG PO TABS: 25.0000 mg | ORAL_TABLET | Freq: Two times a day (BID) | ORAL | 3 refills | Status: DC

## 2017-02-06 MED FILL — traZODone HCL 50 MG TABS: 50 | 30 days supply | Qty: 30 | Fill #0

## 2017-02-06 MED FILL — METOPROLOL TARTRATE 25 MG T: 25 | 30 days supply | Qty: 60 | Fill #0

## 2017-02-06 NOTE — Patient Instructions (Addendum)
Check Blood pressure daily and record and take to cardiology office and bring to next visit here.    Steps to Quit Smoking Smoking tobacco can be bad for your health. It can also affect almost every organ in your body. Smoking puts you and people around you at risk for many serious long-lasting (chronic) diseases. Quitting smoking is hard, but it is one of the best things that you can do for your health. It is never too late to quit. What are the benefits of quitting smoking? When you quit smoking, you lower your risk for getting serious diseases and conditions. They can include:  Lung cancer or lung disease.  Heart disease.  Stroke.  Heart attack.  Not being able to have children (infertility).  Weak bones (osteoporosis) and broken bones (fractures). If you have coughing, wheezing, and shortness of breath, those symptoms may get better when you quit. You may also get sick less often. If you are pregnant, quitting smoking can help to lower your chances of having a baby of low birth weight. What can I do to help me quit smoking? Talk with your doctor about what can help you quit smoking. Some things you can do (strategies) include:  Quitting smoking totally, instead of slowly cutting back how much you smoke over a period of time.  Going to in-person counseling. You are more likely to quit if you go to many counseling sessions.  Using resources and support systems, such as:  Online chats with a Veterinary surgeon.  Phone quitlines.  Printed Materials engineer.  Support groups or group counseling.  Text messaging programs.  Mobile phone apps or applications.  Taking medicines. Some of these medicines may have nicotine in them. If you are pregnant or breastfeeding, do not take any medicines to quit smoking unless your doctor says it is okay. Talk with your doctor about counseling or other things that can help you. Talk with your doctor about using more than one strategy at the same  time, such as taking medicines while you are also going to in-person counseling. This can help make quitting easier. What things can I do to make it easier to quit? Quitting smoking might feel very hard at first, but there is a lot that you can do to make it easier. Take these steps:  Talk to your family and friends. Ask them to support and encourage you.  Call phone quitlines, reach out to support groups, or work with a Veterinary surgeon.  Ask people who smoke to not smoke around you.  Avoid places that make you want (trigger) to smoke, such as:  Bars.  Parties.  Smoke-break areas at work.  Spend time with people who do not smoke.  Lower the stress in your life. Stress can make you want to smoke. Try these things to help your stress:  Getting regular exercise.  Deep-breathing exercises.  Yoga.  Meditating.  Doing a body scan. To do this, close your eyes, focus on one area of your body at a time from head to toe, and notice which parts of your body are tense. Try to relax the muscles in those areas.  Download or buy apps on your mobile phone or tablet that can help you stick to your quit plan. There are many free apps, such as QuitGuide from the [employer] developer for Disease Control and Prevention). You can find more support from smokefree.gov and other websites. This information is not intended to replace advice given to you by your health care provider.  Make sure you discuss any questions you have with your health care provider. Document Released: 08/10/2009 Document Revised: 06/11/2016 Document Reviewed: 02/28/2015 Elsevier Interactive Patient Education  2017 Elsevier Inc. Hypertension Hypertension is another name for high blood pressure. High blood pressure forces your heart to work harder to pump blood. This can cause problems over time. There are two numbers in a blood pressure reading. There is a top number (systolic) over a bottom number (diastolic). It is best to have a blood  pressure below 120/80. Healthy choices can help lower your blood pressure. You may need medicine to help lower your blood pressure if:  Your blood pressure cannot be lowered with healthy choices.  Your blood pressure is higher than 130/80. Follow these instructions at home: Eating and drinking   If directed, follow the DASH eating plan. This diet includes:  Filling half of your plate at each meal with fruits and vegetables.  Filling one quarter of your plate at each meal with whole grains. Whole grains include whole wheat pasta, brown rice, and whole grain bread.  Eating or drinking low-fat dairy products, such as skim milk or low-fat yogurt.  Filling one quarter of your plate at each meal with low-fat (lean) proteins. Low-fat proteins include fish, skinless chicken, eggs, beans, and tofu.  Avoiding fatty meat, cured and processed meat, or chicken with skin.  Avoiding premade or processed food.  Eat less than 1,500 mg of salt (sodium) a day.  Limit alcohol use to no more than 1 drink a day for nonpregnant women and 2 drinks a day for men. One drink equals 12 oz of beer, 5 oz of wine, or 1 oz of hard liquor. Lifestyle   Work with your doctor to stay at a healthy weight or to lose weight. Ask your doctor what the best weight is for you.  Get at least 30 minutes of exercise that causes your heart to beat faster (aerobic exercise) most days of the week. This may include walking, swimming, or biking.  Get at least 30 minutes of exercise that strengthens your muscles (resistance exercise) at least 3 days a week. This may include lifting weights or pilates.  Do not use any products that contain nicotine or tobacco. This includes cigarettes and e-cigarettes. If you need help quitting, ask your doctor.  Check your blood pressure at home as told by your doctor.  Keep all follow-up visits as told by your doctor. This is important. Medicines   Take over-the-counter and prescription  medicines only as told by your doctor. Follow directions carefully.  Do not skip doses of blood pressure medicine. The medicine does not work as well if you skip doses. Skipping doses also puts you at risk for problems.  Ask your doctor about side effects or reactions to medicines that you should watch for. Contact a doctor if:  You think you are having a reaction to the medicine you are taking.  You have headaches that keep coming back (recurring).  You feel dizzy.  You have swelling in your ankles.  You have trouble with your vision. Get help right away if:  You get a very bad headache.  You start to feel confused.  You feel weak or numb.  You feel faint.  You get very bad pain in your:  Chest.  Belly (abdomen).  You throw up (vomit) more than once.  You have trouble breathing. Summary  Hypertension is another name for high blood pressure.  Making healthy choices can help lower  blood pressure. If your blood pressure cannot be controlled with healthy choices, you may need to take medicine. This information is not intended to replace advice given to you by your health care provider. Make sure you discuss any questions you have with your health care provider. Document Released: 04/01/2008 Document Revised: 09/11/2016 Document Reviewed: 09/11/2016 Elsevier Interactive Patient Education  2017 [employer].

## 2017-02-06 NOTE — Progress Notes (Signed)
Patient ID: Anthony Coffey, male   DOB: 29-Dec-1955, 61 y.o.   MRN: [id number]     Anthony Coffey, is a 61 y.o. male  WUJ:[id number]  [id number]  DOB - [birth year]  Subjective:  Chief Complaint and HPI: Anthony Coffey is a 61 y.o. male here today to establish care and for a follow up visit After being hospitalized 02/01/2017-02/04/2017 for CP/STEMI with emergency cath/angioplasty/stenting.  PMH htn, anxiety, COPD.   Today, he presents feeling good overall.  He is taking all of his medications including chantix, but he has still smoked about 3 cigs/day.  He denies any further episodes of CP.  He had not been to a doctor for many years when this episode occurred.  He has likely had untreated htn for a while.    Has f/up appt with cardiology at 2pm on 02/10/2017.  From hospital notes:  Acute inferolateral infarction due to occlusion of the second obtuse marginal which is a large vessel that runs along the inferolateral wall.  100% second obtuse marginal reduced to 0% with TIMI grade 3 flow after angioplasty and stenting using a 2.5 x 16 mm Synergy DES.  90% mid LAD stenosis  Widely patent left main and small nondominant RCA.  Normal LV function with EDP 23 mmHg. LVEF 55%  SUCCESSFUL STAGED PCI OF SEVERE MID-LAD STENOSIS USING A 2.5X12 MM PROMUS DES  RECOMMEND:  DAPT with ASA and brilinta minimum of 12 months (STEMI at initial presentation)  He underwent LHC showing 100% 2nd OM with TIMI grade 3 following angioplasty with DES x1, and 90% mLAD with widely patent nondominant RCA. Troponin peaked at 47.01. EF noted 55% with normal LVEDP. Underwent staged mLAD on 02/03/17 with DES x1. Plan for DAPT with ASA/Brilinta. Asa and statin therapy were also initiated. He did well post cath. No chest pain or dyspnea. Did have a hematoma at the right radial cath site but improved with manual pressure. Smaller in size the following morning, and denied any pain. He was started on Chantix by Dr. Ladona Ridgel in  efforts to stop smoking, this prescription was continued at discharge.   His labs were stable following cath. Echo 50-55% no WMA and G1DD. He worked with cardiac rehab without any complaints. Was seen by Dr. Allyson Sabal and determined stable for discharge home. Follow up has been arranged. Does not have a PCP, but plans to follow up at the [facility] center. His medications were printed at the time of discharge, and was given a 30 day Brilinta card.   ED/Hospital notes reviewed.    ROS:   Constitutional:  No f/c, No night sweats, No unexplained weight loss. EENT:  No vision changes, No blurry vision, No hearing changes. No mouth, throat, or ear problems.  Respiratory: No cough, No SOB Cardiac: No CP, no palpitations GI:  No abd pain, No N/V/D. GU: No Urinary s/sx Musculoskeletal: No joint pain Neuro: No headache, no dizziness, no motor weakness.  Skin: No rash Endocrine:  No polydipsia. No polyuria.  Psych: Denies SI/HI  No problems updated.  ALLERGIES: No Known Allergies  PAST MEDICAL HISTORY: Past Medical History:  Diagnosis Date  . Aneurysm (HCC)   . Anxiety   . CAD (coronary artery disease)    02/01/17 Cath DES--> OM, staged PCI DES -->mLAD, EF 55%  . COPD (chronic obstructive pulmonary disease) (HCC)    smoker  . Hypertension    high with anxiety  . Myocardial infarction     MEDICATIONS AT HOME: Prior to Admission medications  Medication Sig Start Date End Date Taking? Authorizing Provider  acetaminophen (TYLENOL) 500 MG tablet Take 1 tablet (500 mg total) by mouth every 6 (six) hours as needed for headache (pain). 02/04/17   Arty Baumgartner, NP  aspirin 81 MG chewable tablet Chew 1 tablet (81 mg total) by mouth daily. 02/04/17   Arty Baumgartner, NP  atorvastatin (LIPITOR) 80 MG tablet Take 1 tablet (80 mg total) by mouth daily at 6 PM. 02/04/17   Arty Baumgartner, NP  metoprolol tartrate (LOPRESSOR) 25 MG tablet Take 1 tablet (25 mg total) by mouth 2 (two) times daily. 02/06/17    Anders Simmonds, PA-C  nitroGLYCERIN (NITROSTAT) 0.4 MG SL tablet Place 1 tablet (0.4 mg total) under the tongue every 5 (five) minutes as needed. 02/04/17   Arty Baumgartner, NP  ticagrelor (BRILINTA) 90 MG TABS tablet Take 1 tablet (90 mg total) by mouth 2 (two) times daily. 02/04/17   Arty Baumgartner, NP  traZODone (DESYREL) 50 MG tablet Take 0.5-1 tablets (25-50 mg total) by mouth at bedtime as needed for sleep. 02/06/17   Anders Simmonds, PA-C  varenicline (CHANTIX) 1 MG tablet Take 1 tablet (1 mg total) by mouth daily. 02/04/17   Arty Baumgartner, NP     Objective:  EXAM:   Vitals:   02/06/17 1341  BP: (!) 165/99  Pulse: 63  Resp: 16  Temp: 98.1 F (36.7 C)  TempSrc: Oral  SpO2: 98%  Weight: 184 lb 3.2 oz (83.6 kg)    General appearance : A&OX3. NAD. Non-toxic-appearing HEENT: Atraumatic and Normocephalic.  PERRLA. EOM intact.  TM clear B. Mouth-MMM, post pharynx WNL w/o erythema, No PND. Neck: supple, no JVD. No cervical lymphadenopathy. No thyromegaly Chest/Lungs:  Breathing-non-labored, Good air entry bilaterally, breath sounds normal without rales, rhonchi, or wheezing  CVS: S1 S2 regular, no murmurs, gallops, rubs  Extremities: Bilateral Lower Ext shows no edema, both legs are warm to touch with = pulse throughout.  R volar forearm with large ecchymoses but no swelling or sign of cellulitis from cath intervention. Neurology:  CN II-XII grossly intact, Non focal.   Psych:  TP linear. J/I WNL. Normal speech. Appropriate eye contact and affect.  Skin:  No Rash  Data Review Lab Results  Component Value Date   HGBA1C 5.5 02/03/2017     Assessment & Plan   1. s/p ST elevation myocardial infarction involving left anterior descending (LAD) coronary artery (HCC) Continue Brillinta,  aspirin Keep cardiology f/up appt for 2pm 02/10/2017  2. Pure hypercholesterolemia Continue Lipitor  3. Tobacco abuse Continue Chantix; try to supplement with nicotene gum or  patches for breakthrough cravings.  Information on cessation and resources provided.  4. Primary insomnia Can try prn - traZODone (DESYREL) 50 MG tablet; Take 0.5-1 tablets (25-50 mg total) by mouth at bedtime as needed for sleep.  Dispense: 30 tablet; Refill: 3  5. Hypertension, unspecified type Uncontrolled; increase dose - metoprolol tartrate (LOPRESSOR) 25 MG tablet; Take 1 tablet (25 mg total) by mouth 2 (two) times daily.  Dispense: 180 tablet; Refill: 3 Patient also given financial packet. Patient have been counseled extensively about nutrition and exercise  Return in about 3 weeks (around 02/27/2017) for assign PCP; f/up htn; STEMI; insomnia.  The patient was given clear instructions to go to ER or return to medical center if symptoms don't improve, worsen or new problems develop. The patient verbalized understanding. The patient was told to call to get lab results if  they haven't heard anything in the next week.    Georgian Co, PA-C [facility] [place], [place] [phone]   02/06/2017, 1:52 PM

## 2017-02-07 NOTE — Progress Notes (Signed)
Cardiology Office Note   Date:  02/10/2017   ID:  [name], DOB 07-11-1956, MRN [id number]  PCP:  No Pcp Per Pt (Inactive)  Cardiologist:  Dr. Katrinka Blazing     Chief Complaint  Patient presents with  . Hospitalization Follow-up    STEMI      History of Present Illness: Anthony Coffey is a 61 y.o. male who presents for post hospital with STEMI inf wall, with occl of second OM which is a large vessel running along inferolateral wall.  PCI with Synergy DES - also on emergent cath 90% mLAD , EF 55%, pt had staged PCI to LAD on 02/03/17 with promus DES.  Plan for DAPT for 12 months at least.  Echo with EF 50-55%, G1 DD.  Brilinta , BB, statin.  Other hx of of aortic aneurysm repair 11 years ago during acute rupture.  During this recent admit Troponin pk at 47.   T chol 320, TG 852, Hdl 27 and Cr 0.71.   It was also noted his PAD with absent Lt pedal pulses and bil femoral bruits.     Today pt has seen his PCP and his lopressor was increased from 12.5 BID to 25 mg BID due to HTN.  Today BP is improved but still greater than goal.  HR is 60 so will add ACE.  He did have his desyrel filled to help with anxiety in stopping tobacco.  + tobacco use, d/c'd with chantix and he is smoking about 2 cigarettes per day.  In a week he will decrease to 1 a day.  He is walking for exercise.  He does not have insurance and cannot afford cardiac rehab but will continue to follow  Discharge exercise instructions.  He is eating healthy.    No chest pain.  Occ SOB that comes and goes and may be side effect of Brilinta.  It is not bothersome currently but discussed if it did become so we could change.   He is taking his meds correctly and understands importance of not stopping Brilinta and ASA.  He know to call if any problems obtaining.   He has NTG,    Past Medical History:  Diagnosis Date  . Aneurysm (HCC)   . Anxiety   . CAD (coronary artery disease)    02/01/17 Cath DES--> OM, staged PCI DES -->mLAD, EF 55%    . COPD (chronic obstructive pulmonary disease) (HCC)    smoker  . Hypertension    high with anxiety  . Myocardial infarction [facility])     Past Surgical History:  Procedure Laterality Date  . ABDOMINAL AORTIC ANEURYSM REPAIR    . CORONARY STENT INTERVENTION N/A 02/01/2017   Procedure: Coronary Stent Intervention;  Surgeon: Lyn Records, MD;  Location: [facility] INVASIVE CV LAB;  Service: Cardiovascular;  Laterality: N/A;  . CORONARY STENT INTERVENTION N/A 02/03/2017   Procedure: Coronary Stent Intervention;  Surgeon: Tonny Bollman, MD;  Location: [facility] INVASIVE CV LAB;  Service: Cardiovascular;  Laterality: N/A;  . EAR CYST EXCISION Right 06/14/2013   Procedure: CYST REMOVAL RIGHT MANDIBLE;  Surgeon: Georgia Lopes, DDS;  Location: [facility] OR;  Service: Oral Surgery;  Laterality: Right;  . LEFT HEART CATH AND CORONARY ANGIOGRAPHY N/A 02/01/2017   Procedure: Left Heart Cath and Coronary Angiography;  Surgeon: Lyn Records, MD;  Location: [facility] INVASIVE CV LAB;  Service: Cardiovascular;  Laterality: N/A;  . TONSILLECTOMY    . TOOTH EXTRACTION N/A 06/14/2013   Procedure: EXTRACTIONS  17, 23, 26, 27, 29, 32;  Surgeon: Georgia Lopes, DDS;  Location: [facility] OR;  Service: Oral Surgery;  Laterality: N/A;  . VASCULAR SURGERY      Current meds:   Current Outpatient Prescriptions on File Prior to Visit  Medication Sig Dispense Refill  . acetaminophen (TYLENOL) 500 MG tablet Take 1 tablet (500 mg total) by mouth every 6 (six) hours as needed for headache (pain). 30 tablet 0  . aspirin 81 MG chewable tablet Chew 1 tablet (81 mg total) by mouth daily.    Marland Kitchen atorvastatin (LIPITOR) 80 MG tablet Take 1 tablet (80 mg total) by mouth daily at 6 PM. 30 tablet 6  . metoprolol tartrate (LOPRESSOR) 25 MG tablet Take 1 tablet (25 mg total) by mouth 2 (two) times daily. 180 tablet 3  . nitroGLYCERIN (NITROSTAT) 0.4 MG SL tablet Place 1 tablet (0.4 mg total) under the tongue every 5 (five) minutes as needed. 25 tablet 3  . ticagrelor  (BRILINTA) 90 MG TABS tablet Take 1 tablet (90 mg total) by mouth 2 (two) times daily. 60 tablet 10  . varenicline (CHANTIX) 1 MG tablet Take 1 tablet (1 mg total) by mouth daily. 30 tablet 0   Current Facility-Administered Medications on File Prior to Visit  Medication Dose Route Frequency Provider Last Rate Last Dose  . 0.9 %  sodium chloride infusion    Continuous PRN Lyn Records, MD 50 mL/hr at 02/01/17 1749 50 mL/hr at 02/01/17 1749  . 0.9 %  sodium chloride infusion    Continuous PRN Lyn Records, MD 10 mL/hr at 02/01/17 1703 10 mL/hr at 02/01/17 1703  . fentaNYL (SUBLIMAZE) injection    PRN Lyn Records, MD   50 mcg at 02/01/17 1730  . heparin infusion 2 units/mL in 0.9 % sodium chloride    Continuous PRN Lyn Records, MD   1,000 mL at 02/01/17 1744  . heparin injection    PRN Lyn Records, MD   1,500 Units at 02/01/17 1729  . iopamidol (ISOVUE-370) 76 % injection    PRN Lyn Records, MD   175 mL at 02/01/17 1742  . lidocaine (PF) (XYLOCAINE) 1 % injection    PRN Lyn Records, MD   2 mL at 02/01/17 1704  . nitroGLYCERIN 1 mg/10 ml (100 mcg/ml) - IR/CATH LAB    PRN Lyn Records, MD   200 mcg at 02/01/17 1732  . Radial Cocktail/Verapamil only    PRN Lyn Records, MD   10 mL at 02/01/17 1705  . ticagrelor (BRILINTA) tablet    PRN Lyn Records, MD   180 mg at 02/01/17 1716   NOT SURE WHY THE HOSPITAL MEDS ARE IN THIS VISIT- THEY WERE STOPPED AT DISCHARGE.   ONLY ON OUTPATIENT MEDICATIONS.   Allergies:   Patient has no known allergies.    Social History:  The patient  reports that he has been smoking Cigarettes.  He has a 39.00 pack-year smoking history. He has quit using smokeless tobacco. He reports that he does not drink alcohol or use drugs.   Family History:  The patient's family history includes Anxiety disorder in his brother.    ROS:  General:no colds or fevers, no weight changes Skin:no rashes or ulcers HEENT:no blurred vision, no congestion CV:see HPI PUL:see  HPI GI:no diarrhea constipation or melena, no indigestion GU:no hematuria, no dysuria MS:no joint pain, no claudication Neuro:no syncope, no lightheadedness Endo:no diabetes, no thyroid disease  Wt Readings from Last 3 Encounters:  02/10/17 182 lb 12.8 oz (82.9 kg)  02/06/17 184 lb 3.2 oz (83.6 kg)  02/04/17 180 lb (81.6 kg)     PHYSICAL EXAM: VS:  BP (!) 142/78   Pulse 61   Ht  (1.753 m)   Wt 182 lb 12.8 oz (82.9 kg)   BMI 26.99 kg/m  , BMI Body mass index is 26.99 kg/m. General:Pleasant affect, NAD Skin:Warm and dry, brisk capillary refill HEENT:normocephalic, sclera clear, mucus membranes moist Neck:supple, no JVD, no bruits  Heart:S1S2 RRR without murmur, gallup, rub or click Lungs:clear without rales, rhonchi, or wheezes ZOX:WRUE, non tender, + BS, do not palpate liver spleen or masses Ext:no lower ext edema, 2+ pedal pulses, 2+ radial pulses, rt forearm with ecchymosis from cath site.  Pulse is good.   Neuro:alert and oriented X 3, MAE, follows commands, + facial symmetry    EKG:  EKG is ordered today. The ekg ordered today demonstrates SR with T wave inversions inf lat leads but less than in hospital.  Evolving MI changes.     Recent Labs: 02/01/2017: B Natriuretic Peptide 26.7 02/02/2017: ALT 46 02/04/2017: BUN 6; Creatinine, Ser 0.71; Hemoglobin 15.2; Platelets 249; Potassium 3.6; Sodium 140    Lipid Panel    Component Value Date/Time   CHOL 320 (H) 02/01/2017 1825   TRIG 852 (H) 02/01/2017 1825   HDL 27 (L) 02/01/2017 1825   CHOLHDL 11.9 02/01/2017 1825   VLDL UNABLE TO CALCULATE IF TRIGLYCERIDE OVER 400 mg/dL 45/40/9811 9147   LDLCALC UNABLE TO CALCULATE IF TRIGLYCERIDE OVER 400 mg/dL 82/95/6213 0865       Other studies Reviewed: Additional studies/ records that were reviewed today include: .  02/01/17 emergent cath. Procedures   Coronary Stent Intervention  Left Heart Cath and Coronary Angiography  Conclusion    Acute inferolateral  infarction due to occlusion of the second obtuse marginal which is a large vessel that runs along the inferolateral wall.  100% second obtuse marginal reduced to 0% with TIMI grade 3 flow after angioplasty and stenting using a 2.5 x 16 mm Synergy DES.  90% mid LAD stenosis  Widely patent left main and small nondominant RCA.  Normal LV function with EDP 23 mmHg. LVEF 55%   RECOMMENDATIONS:   High intensity statin therapy  Dual antiplatelet therapy with aspirin and Brilinta  IV hydration as tolerated  Chest x-ray should be performed in this smoker who has not had consistent healthcare for greater than 10 years.  Staged LAD intervention on Monday. Will need orders written Sunday evening assuming no complications  Indications   Acute ST elevation myocardial infarction (STEMI) involving other coronary artery of inferior wall (HCC) [I21.19 (ICD-10-CM)]   02/03/17 Procedures   Coronary Stent Intervention  Conclusion   SUCCESSFUL STAGED PCI OF SEVERE MID-LAD STENOSIS USING A 2.5X12 MM PROMUS DES  RECOMMEND:  DAPT with ASA and brilinta minimum of 12 months (STEMI at initial presentation)  Hospital discharge tomorrow if no complications arise  Indications   ST elevation myocardial infarction (STEMI), unspecified artery (HCC) [I21.3 (ICD-10-CM)]  Procedural   ECHO 02/03/17 Study Conclusions  - Left ventricle: The cavity size was normal. There was mild   concentric hypertrophy. Systolic function was normal. The   estimated ejection fraction was in the range of 50% to 55%. Wall   motion was normal; there were no regional wall motion   abnormalities. Doppler parameters are consistent with abnormal   left ventricular relaxation (grade 1  diastolic dysfunction).   There was no evidence of elevated ventricular filling pressure by   Doppler parameters. - Aortic valve: There was no regurgitation. - Aortic root: The aortic root was normal in size. - Mitral valve: Structurally  normal valve. There was mild   regurgitation. - Right ventricle: Systolic function was normal. - Tricuspid valve: There was trivial regurgitation. - Pulmonary arteries: Systolic pressure was within the normal   range. - Inferior vena cava: The vessel was normal in size. - Pericardium, extracardiac: There was no pericardial effusion.   ASSESSMENT AND PLAN:  1.  STEMI with emergent PCI to OM2 and then 2 days later staged intervention to LAD for tight stenosis.  Pk troponin 47.  On DAPT with Brilinta and ASA.  Continue for 1 year.   - ok for pt to drive.   - he has NTG.  2. CAD with MI and stents.    3. HLD, on high dose statin, continue and recheck hepatic and lipids in 6 weeks through PCP or [place]. .   4. Hx of aortic dissection with no pedal pulses, PAD will plan for lower ext arterial dopplers.  Pt has not seen Dr. Hart Rochester for some time.    5. HTN add lisiopril 10 mg daily.  HR is in the los 60s and am hesitant to increase BB because of this.    6. Tobacco use, pt has decreased and plans to stop.     Current medicines are reviewed with the patient today.  The patient Has no concerns regarding medicines.  The following changes have been made:  See above Labs/ tests ordered today include:see above  Disposition:   FU:  see above  Signed, Nada Boozer, NP  02/10/2017 2:27 PM    [facility] [address] [place], [place], [place]  [address]/ [address] [place], [place] Phone: [phone]; Fax: [phone]  [phone]

## 2017-02-10 ENCOUNTER — Encounter: Payer: Self-pay | Admitting: Cardiology

## 2017-02-10 ENCOUNTER — Ambulatory Visit (INDEPENDENT_AMBULATORY_CARE_PROVIDER_SITE_OTHER): Payer: Self-pay | Admitting: Cardiology

## 2017-02-10 VITALS — BP 142/78 | HR 61 | Ht 69.0 in | Wt 182.8 lb

## 2017-02-10 DIAGNOSIS — Z720 Tobacco use: Secondary | ICD-10-CM

## 2017-02-10 DIAGNOSIS — I2102 ST elevation (STEMI) myocardial infarction involving left anterior descending coronary artery: Secondary | ICD-10-CM

## 2017-02-10 DIAGNOSIS — I2119 ST elevation (STEMI) myocardial infarction involving other coronary artery of inferior wall: Secondary | ICD-10-CM

## 2017-02-10 DIAGNOSIS — I739 Peripheral vascular disease, unspecified: Secondary | ICD-10-CM

## 2017-02-10 DIAGNOSIS — I2510 Atherosclerotic heart disease of native coronary artery without angina pectoris: Secondary | ICD-10-CM

## 2017-02-10 DIAGNOSIS — E782 Mixed hyperlipidemia: Secondary | ICD-10-CM

## 2017-02-10 MED ORDER — LISINOPRIL 10 MG PO TABS: 10.0000 mg | ORAL_TABLET | Freq: Every day | ORAL | 6 refills | Status: DC

## 2017-02-10 NOTE — Patient Instructions (Signed)
Medication Instructions:  Your physician has recommended you make the following change in your medication:  1. Start Lisinopril (10 mg) daily, patient was given written script today.   Labwork: -None  Testing/Procedures: Your physician has requested that you have a lower extremity arterial exercise duplex. During this test, exercise and ultrasound are used to evaluate arterial blood flow in the legs. Allow one hour for this exam. There are no restrictions or special instructions.   Follow-Up: Your physician recommends that you keep your scheduled  follow-up appointment with Dr. Katrinka Blazing.    Any Other Special Instructions Will Be Listed Below (If Applicable).     If you need a refill on your cardiac medications before your next appointment, please call your pharmacy.

## 2017-02-12 ENCOUNTER — Ambulatory Visit: Payer: Self-pay | Attending: Internal Medicine

## 2017-02-12 MED FILL — LISINOPRIL 10 MG TABLET: 10 | 30 days supply | Qty: 30 | Fill #0

## 2017-02-13 ENCOUNTER — Other Ambulatory Visit: Payer: Self-pay | Admitting: *Deleted

## 2017-02-13 MED ORDER — VARENICLINE TARTRATE 1 MG PO TABS: 1.0000 mg | ORAL_TABLET | Freq: Every day | ORAL | 3 refills | Status: DC

## 2017-02-13 MED ORDER — TICAGRELOR 90 MG PO TABS: 90.0000 mg | ORAL_TABLET | Freq: Two times a day (BID) | ORAL | 3 refills | Status: DC

## 2017-02-13 NOTE — Telephone Encounter (Signed)
PRINTED FOR PASS PROGRAM 

## 2017-02-20 ENCOUNTER — Other Ambulatory Visit: Payer: Self-pay | Admitting: Cardiology

## 2017-02-20 DIAGNOSIS — I739 Peripheral vascular disease, unspecified: Secondary | ICD-10-CM

## 2017-03-03 ENCOUNTER — Ambulatory Visit (HOSPITAL_COMMUNITY)
Admission: RE | Admit: 2017-03-03 | Discharge: 2017-03-03 | Disposition: A | Discharge status: Home / Self Care | Payer: Self-pay | Source: Ambulatory Visit | Attending: Cardiovascular Disease | Admitting: Cardiovascular Disease

## 2017-03-03 DIAGNOSIS — Z720 Tobacco use: Secondary | ICD-10-CM | POA: Insufficient documentation

## 2017-03-03 DIAGNOSIS — E785 Hyperlipidemia, unspecified: Secondary | ICD-10-CM | POA: Insufficient documentation

## 2017-03-03 DIAGNOSIS — I739 Peripheral vascular disease, unspecified: Secondary | ICD-10-CM

## 2017-03-03 DIAGNOSIS — J449 Chronic obstructive pulmonary disease, unspecified: Secondary | ICD-10-CM | POA: Insufficient documentation

## 2017-03-03 DIAGNOSIS — I10 Essential (primary) hypertension: Secondary | ICD-10-CM | POA: Insufficient documentation

## 2017-03-03 DIAGNOSIS — R938 Abnormal findings on diagnostic imaging of other specified body structures: Secondary | ICD-10-CM | POA: Insufficient documentation

## 2017-03-03 DIAGNOSIS — I2510 Atherosclerotic heart disease of native coronary artery without angina pectoris: Secondary | ICD-10-CM | POA: Insufficient documentation

## 2017-03-04 MED FILL — ATORVASTATIN 80 MG TABLET: 80 | 30 days supply | Qty: 30 | Fill #1

## 2017-03-04 MED FILL — BRILINTA 90 MG TABLET: 90 | 30 days supply | Qty: 60 | Fill #1

## 2017-03-04 MED FILL — LISINOPRIL 10 MG TABLET: 10 | 30 days supply | Qty: 30 | Fill #1

## 2017-03-04 MED FILL — METOPROLOL TARTRATE 25 MG T: 25 | 30 days supply | Qty: 60 | Fill #1

## 2017-03-06 ENCOUNTER — Ambulatory Visit: Payer: Self-pay | Admitting: Family Medicine

## 2017-03-24 NOTE — Progress Notes (Addendum)
Cardiology Office Note    Date:  03/25/2017   ID:  EMERSYN KOTARSKI, DOB 10/20/1956, MRN [id number]  PCP:  Pt, No Pcp Per (Inactive)  Cardiologist: Lesleigh Noe, MD   Chief Complaint  Patient presents with  . Coronary Artery Disease    History of Present Illness:  Anthony Coffey is a 61 y.o. male hypertension, COPD, anxiety neurosis, and recent ST elevation myocardial infarction April 2018 treated with OM DES and subsequent staged mid LAD DES 2 days later.  Since discharge he has done well. He denies chest pain. No medication side effects. No palpitations or episodes of syncope. He continues to smoke. Says he is down to 3 cigarettes per day.  Spent considerable time reviewing digital images and explaining his burden of residual disease.  Past Medical History:  Diagnosis Date  . Aneurysm (HCC)   . Anxiety   . CAD (coronary artery disease)    02/01/17 Cath DES--> OM, staged PCI DES -->mLAD, EF 55%  . COPD (chronic obstructive pulmonary disease) (HCC)    smoker  . Hypertension    high with anxiety  . Myocardial infarction [facility])     Past Surgical History:  Procedure Laterality Date  . ABDOMINAL AORTIC ANEURYSM REPAIR    . CORONARY STENT INTERVENTION N/A 02/01/2017   Procedure: Coronary Stent Intervention;  Surgeon: Lyn Records, MD;  Location: [facility] INVASIVE CV LAB;  Service: Cardiovascular;  Laterality: N/A;  . CORONARY STENT INTERVENTION N/A 02/03/2017   Procedure: Coronary Stent Intervention;  Surgeon: Tonny Bollman, MD;  Location: [facility] INVASIVE CV LAB;  Service: Cardiovascular;  Laterality: N/A;  . EAR CYST EXCISION Right 06/14/2013   Procedure: CYST REMOVAL RIGHT MANDIBLE;  Surgeon: Georgia Lopes, DDS;  Location: [facility] OR;  Service: Oral Surgery;  Laterality: Right;  . LEFT HEART CATH AND CORONARY ANGIOGRAPHY N/A 02/01/2017   Procedure: Left Heart Cath and Coronary Angiography;  Surgeon: Lyn Records, MD;  Location: [facility] INVASIVE CV LAB;  Service: Cardiovascular;  Laterality: N/A;    . TONSILLECTOMY    . TOOTH EXTRACTION N/A 06/14/2013   Procedure: EXTRACTIONS 17, 23, 26, 27, 29, 32;  Surgeon: Georgia Lopes, DDS;  Location: [facility] OR;  Service: Oral Surgery;  Laterality: N/A;  . VASCULAR SURGERY      Current Medications: Outpatient Medications Prior to Visit  Medication Sig Dispense Refill  . acetaminophen (TYLENOL) 500 MG tablet Take 1 tablet (500 mg total) by mouth every 6 (six) hours as needed for headache (pain). 30 tablet 0  . aspirin 81 MG chewable tablet Chew 1 tablet (81 mg total) by mouth daily.    Marland Kitchen atorvastatin (LIPITOR) 80 MG tablet Take 1 tablet (80 mg total) by mouth daily at 6 PM. 30 tablet 6  . lisinopril (PRINIVIL,ZESTRIL) 10 MG tablet Take 1 tablet (10 mg total) by mouth daily. 30 tablet 6  . metoprolol tartrate (LOPRESSOR) 25 MG tablet Take 1 tablet (25 mg total) by mouth 2 (two) times daily. 180 tablet 3  . ticagrelor (BRILINTA) 90 MG TABS tablet Take 1 tablet (90 mg total) by mouth 2 (two) times daily. 180 tablet 3  . varenicline (CHANTIX) 1 MG tablet Take 1 tablet (1 mg total) by mouth daily. 90 tablet 3  . nitroGLYCERIN (NITROSTAT) 0.4 MG SL tablet Place 1 tablet (0.4 mg total) under the tongue every 5 (five) minutes as needed. (Patient not taking: Reported on 03/25/2017) 25 tablet 3   Facility-Administered Medications Prior to Visit  Medication  Dose Route Frequency Provider Last Rate Last Dose  . 0.9 %  sodium chloride infusion    Continuous PRN Lyn RecordsSmith, Jeris Easterly W, MD 50 mL/hr at 02/01/17 1749 50 mL/hr at 02/01/17 1749  . 0.9 %  sodium chloride infusion    Continuous PRN Lyn RecordsSmith, Maelie Chriswell W, MD 10 mL/hr at 02/01/17 1703 10 mL/hr at 02/01/17 1703  . fentaNYL (SUBLIMAZE) injection    PRN Lyn RecordsSmith, Dola Lunsford W, MD   50 mcg at 02/01/17 1730  . heparin infusion 2 units/mL in 0.9 % sodium chloride    Continuous PRN Lyn RecordsSmith, Laconya Clere W, MD   1,000 mL at 02/01/17 1744  . heparin injection    PRN Lyn RecordsSmith, Ayomide Purdy W, MD   1,500 Units at 02/01/17 1729  . iopamidol (ISOVUE-370) 76 %  injection    PRN Lyn RecordsSmith, Yui Mulvaney W, MD   175 mL at 02/01/17 1742  . lidocaine (PF) (XYLOCAINE) 1 % injection    PRN Lyn RecordsSmith, Moncia Annas W, MD   2 mL at 02/01/17 1704  . nitroGLYCERIN 1 mg/10 ml (100 mcg/ml) - IR/CATH LAB    PRN Lyn RecordsSmith, Sumi Lye W, MD   200 mcg at 02/01/17 1732  . Radial Cocktail/Verapamil only    PRN Lyn RecordsSmith, Kiyoko Mcguirt W, MD   10 mL at 02/01/17 1705  . ticagrelor (BRILINTA) tablet    PRN Lyn RecordsSmith, Collie Wernick W, MD   180 mg at 02/01/17 1716     Allergies:   Patient has no known allergies.   Social History   Social History  . Marital status: Single    Spouse name: N/A  . Number of children: N/A  . Years of education: N/A   Social History Main Topics  . Smoking status: Current Every Day Smoker    Packs/day: 1.00    Years: 39.00    Types: Cigarettes  . Smokeless tobacco: Former NeurosurgeonUser  . Alcohol use No  . Drug use: No  . Sexual activity: No   Other Topics Concern  . None   Social History Narrative   ** Merged History Encounter **         Family History:  The patient's family history includes Anxiety disorder in his brother.   ROS:   Please see the history of present illness.     requests medications for anxiety. Excessive sweating, increased appetite, chills, abdominal pain, dyspnea on exertion, depression, and back discomfort. other systems reviewed and are negative.   PHYSICAL EXAM:   VS:  BP (!) 150/86 (BP Location: Left Arm)   Pulse 66   Ht 5\' 9"  (1.753 m)   Wt 180 lb 6.4 oz (81.8 kg)   BMI 26.64 kg/m    GEN: Well nourished, well developed, in no acute distress  HEENT: normal  Neck: no JVD, carotid bruits, or masses Cardiac: RRR; no murmurs, rubs, or gallops,no edema  Respiratory:  clear to auscultation bilaterally, normal work of breathing GI: soft, nontender, nondistended, + BS MS: no deformity or atrophy  Skin: warm and dry, no rash Neuro:  Alert and Oriented x 3, Strength and sensation are intact Psych: euthymic mood, full affect  Wt Readings from Last 3 Encounters:   03/25/17 180 lb 6.4 oz (81.8 kg)  02/10/17 182 lb 12.8 oz (82.9 kg)  02/06/17 184 lb 3.2 oz (83.6 kg)      Studies/Labs Reviewed:   EKG:  ENot repeated  Recent Labs: 02/01/2017: B Natriuretic Peptide 26.7 02/02/2017: ALT 46 02/04/2017: BUN 6; Creatinine, Ser 0.71; Hemoglobin 15.2; Platelets 249; Potassium 3.6; Sodium  140   Lipid Panel    Component Value Date/Time   CHOL 320 (H) 02/01/2017 1825   TRIG 852 (H) 02/01/2017 1825   HDL 27 (L) 02/01/2017 1825   CHOLHDL 11.9 02/01/2017 1825   VLDL UNABLE TO CALCULATE IF TRIGLYCERIDE OVER 400 mg/dL 16/07/9603 5409   LDLCALC UNABLE TO CALCULATE IF TRIGLYCERIDE OVER 400 mg/dL 81/19/1478 2956    Additional studies/ records that were reviewed today include:  Coronary Diagrams  Diagnostic Diagram      Post-Intervention Diagram           ASSESSMENT:    1. Coronary artery disease involving native coronary artery of native heart with angina pectoris (HCC)   2. Essential hypertension   3. Hyperlipidemia LDL goal <70   4. Tobacco abuse   5. Old MI (myocardial infarction)   6. Acute ST elevation myocardial infarction (STEMI) involving left main coronary artery (HCC)      PLAN:  In order of problems listed above:  1. Doing well now status post DES in circumflex and LAD territory. No recurrent ischemic symptoms. Discussed the importance of dual antiplatelet therapy. Clinical follow-up in 6 months. 2. Target blood pressure 140/90 mmHg or less. 3. LDL target less than 70. We will check a lipid panel and liver panel today. 4. Encouraged setting a cessation date. Encouraged to consider Nicorette or nicotine patch to help curb withdrawal symptoms.  Overall doing relatively well. Still smoking. We discussed cessation tactics. Lipid panel today. Further medication adjustment as required. 4-6 month follow-up.  Medication Adjustments/Labs and Tests Ordered: Current medicines are reviewed at length with the patient today.  Concerns  regarding medicines are outlined above.  Medication changes, Labs and Tests ordered today are listed in the Patient Instructions below. Patient Instructions  Medication Instructions:  None  Labwork: Lipid and liver today  Testing/Procedures: None  Follow-Up: Your physician recommends that you schedule a follow-up appointment in: 4-5 months with Dr. Katrinka Blazing.    Any Other Special Instructions Will Be Listed Below (If Applicable).  You may use Nicorette gum or patches to assist in quitting smoking.    If you need a refill on your cardiac medications before your next appointment, please call your pharmacy.      Signed, Lesleigh Noe, MD  03/25/2017 12:28 PM    [facility] [address] [place], [place], [place]  [address] Phone: [phone]; Fax: [phone]

## 2017-03-25 ENCOUNTER — Encounter: Payer: Self-pay | Admitting: Interventional Cardiology

## 2017-03-25 ENCOUNTER — Ambulatory Visit (INDEPENDENT_AMBULATORY_CARE_PROVIDER_SITE_OTHER): Payer: Self-pay | Admitting: Interventional Cardiology

## 2017-03-25 VITALS — BP 150/86 | HR 66 | Ht 69.0 in | Wt 180.4 lb

## 2017-03-25 DIAGNOSIS — I10 Essential (primary) hypertension: Secondary | ICD-10-CM | POA: Insufficient documentation

## 2017-03-25 DIAGNOSIS — Z720 Tobacco use: Secondary | ICD-10-CM

## 2017-03-25 DIAGNOSIS — I252 Old myocardial infarction: Secondary | ICD-10-CM

## 2017-03-25 DIAGNOSIS — I2101 ST elevation (STEMI) myocardial infarction involving left main coronary artery: Secondary | ICD-10-CM

## 2017-03-25 DIAGNOSIS — E785 Hyperlipidemia, unspecified: Secondary | ICD-10-CM

## 2017-03-25 DIAGNOSIS — I25119 Atherosclerotic heart disease of native coronary artery with unspecified angina pectoris: Secondary | ICD-10-CM

## 2017-03-25 NOTE — Patient Instructions (Signed)
Medication Instructions:  None  Labwork: Lipid and liver today  Testing/Procedures: None  Follow-Up: Your physician recommends that you schedule a follow-up appointment in: 4-5 months with Dr. Katrinka BlazingSmith.    Any Other Special Instructions Will Be Listed Below (If Applicable).  You may use Nicorette gum or patches to assist in quitting smoking.    If you need a refill on your cardiac medications before your next appointment, please call your pharmacy.

## 2017-03-26 LAB — LIPID PANEL
CHOLESTEROL TOTAL: 191 mg/dL (ref 100–199)
Chol/HDL Ratio: 5.5 ratio — ABNORMAL HIGH (ref 0.0–5.0)
HDL: 35 mg/dL — AB (ref >39)
TRIGLYCERIDES: 927 mg/dL — AB (ref 0–149)

## 2017-03-26 LAB — HEPATIC FUNCTION PANEL
ALK PHOS: 85 IU/L (ref 39–117)
ALT: 29 IU/L (ref 0–44)
AST: 20 IU/L (ref 0–40)
Albumin: 4.9 g/dL (ref 3.5–5.5)
Bilirubin Total: 0.5 mg/dL (ref 0.0–1.2)
Bilirubin, Direct: 0.1 mg/dL (ref 0.00–0.40)
Total Protein: 7.3 g/dL (ref 6.0–8.5)

## 2017-03-31 ENCOUNTER — Telehealth: Payer: Self-pay | Admitting: Interventional Cardiology

## 2017-03-31 DIAGNOSIS — I25119 Atherosclerotic heart disease of native coronary artery with unspecified angina pectoris: Secondary | ICD-10-CM

## 2017-03-31 DIAGNOSIS — E785 Hyperlipidemia, unspecified: Secondary | ICD-10-CM

## 2017-03-31 DIAGNOSIS — Z79899 Other long term (current) drug therapy: Secondary | ICD-10-CM

## 2017-03-31 MED ORDER — FENOFIBRATE 145 MG PO TABS: 145.0000 mg | ORAL_TABLET | Freq: Every day | ORAL | 3 refills | Status: DC

## 2017-03-31 MED ORDER — ROSUVASTATIN CALCIUM 40 MG PO TABS: 40.0000 mg | ORAL_TABLET | Freq: Every day | ORAL | 3 refills | Status: DC

## 2017-03-31 MED FILL — ?FENOFIBRATE 145 MG TAB: 145 | 30 days supply | Qty: 30 | Fill #0

## 2017-03-31 MED FILL — ROSUVASTATIN CALCIUM 40 MG: 40 | 30 days supply | Qty: 30 | Fill #0

## 2017-03-31 NOTE — Telephone Encounter (Signed)
Spoke with patient- aware of results.   Aware to discontinue atorvastatin.  Start crestor 40mg  daily and fenofibrate 145mg  daily.  Will have repeat labs July 17th AM.  Will have scheduler reach out to schedule lipid clinic appt.   Also discussed importance of monitoring carb and sugar intake as well as stop alcohol use if any.  Patient aware and verbalized understanding.    Rx sent to pharmacy-orders placed and lab appt made.

## 2017-03-31 NOTE — Telephone Encounter (Signed)
Follow Up:    Pt calling back,he did not have time to talk to Constance HolsterJennifer Bowers on Friday about his lab work.

## 2017-04-16 ENCOUNTER — Ambulatory Visit (INDEPENDENT_AMBULATORY_CARE_PROVIDER_SITE_OTHER): Payer: Self-pay | Admitting: Pharmacist

## 2017-04-16 VITALS — BP 120/66 | HR 56 | Wt 185.0 lb

## 2017-04-16 DIAGNOSIS — Z79899 Other long term (current) drug therapy: Secondary | ICD-10-CM | POA: Insufficient documentation

## 2017-04-16 DIAGNOSIS — E785 Hyperlipidemia, unspecified: Secondary | ICD-10-CM

## 2017-04-16 MED FILL — ?METOPROLOL 25 MG TABLET: 25 | 30 days supply | Qty: 60 | Fill #2

## 2017-04-16 MED FILL — LISINOPRIL 10 MG TABLET: 10 | 30 days supply | Qty: 30 | Fill #2

## 2017-04-16 NOTE — Progress Notes (Signed)
Patient ID: Anthony Coffey                 DOB: [birth year]                    MRN: [id number]     HPI: Anthony Coffey is a 61 y.o. male patient referred to lipid clinic by Dr. Katrinka Blazing. PMH is significant for CAD (STEMI 01/2017 w/ OM DES and mid LAD DES), HTN, COPD, and anxiety. At last office visit with Dr. Katrinka Blazing on 03/25/17, a lipid panel was collected and triglycerides were very elevated at 927 mg/dL. Patient was started on fenofibrate for hypertriglyceridemia, and switched from atorvastatin to rosuvastatin for less drug-drug interaction with fibrates.   Patient presents today for lipid clinic establishment. He states tolerating rosuvastatin and fenofibrate, denying muscle and joint pains. He endorses drinking 1 beer 2-3 times per week and diet includes 2 glasses of sweet tea per day and red meat, which may be contributing to his hypertriglyceridemia. He states that he has been out of his cholesterol medications for 3-4 days, however, [facility] states patient picked them up on 03/31/17 and they were filled for 30 days. Patient seemed confused about which medications were for which indication.   When the pharmacy was called, it was noted that he has not picked up lisinopril, metoprolol, or Brilinta since 03/04/17. He was supposed to meet with [facility] financial advisor on 02/12/17 for Brilinta, but didn't go, and was scheduled to see the pharmacy for the PASS program on 04/01/17, but no showed. Per [facility], Plavix would be free but Brilinta would have to go through their patient assistance (PASS) program. Patient was also scheduled for an appointment for PCP establishment on 03/06/17, but cancelled because he was sick. Pt overall does not seem overly engaged in his medical care.  Of note, patient is still smoking 3-4 cigarettes/day on Chantix 1mg  daily. He reports tolerating the medication, only having some vivid dreams (none disturbing or bothersome). He states he has not  started nicotine gum, but admits this may be a good idea. He has no specific quit date in mind, and knows the importance of quitting.   Current Medications:  Rosuvastatin 40mg  daily Fenofibrate 145mg  daily   Intolerances: none  Risk Factors: CAD, STEMI, HTN, male  LDL goal: < 70 mg/dL   Diet: No salt added diet. Breakfast: 2 boiled eggs, Lunch: [place] sandwich, Dinner: peas, corns, chicken, beef, Snacks: crackers. Drinks: 2 glasses sweet tea per day (1 cup sugar/ 0.5 gallon), diet Mt. Dew.  Exercise: Walks ~1 mile every day.   Family History: The patient's family history includes Anxiety disorder in his brother.   Social History: Current smoker (3-4 cigarettes/day), reports alcohol use (1 beer 2-3 times/wk), denies illicit drug use. Patient helps his mother and is usually unavailable on Thursdays and Fridays.   Labs: (03/25/2017) - TC 191, TG 927, HDL 35, LDL uncalc, LFTs wnl (on atorvastatin 80mg  daily)  Past Medical History:  Diagnosis Date  . Aneurysm (HCC)   . Anxiety   . CAD (coronary artery disease)    02/01/17 Cath DES--> OM, staged PCI DES -->mLAD, EF 55%  . COPD (chronic obstructive pulmonary disease) (HCC)    smoker  . Hypertension    high with anxiety  . Myocardial infarction [facility])     Current Outpatient Prescriptions on File Prior to Visit  Medication Sig Dispense Refill  . acetaminophen (TYLENOL) 500 MG tablet Take  1 tablet (500 mg total) by mouth every 6 (six) hours as needed for headache (pain). 30 tablet 0  . aspirin 81 MG chewable tablet Chew 1 tablet (81 mg total) by mouth daily.    . fenofibrate (TRICOR) 145 MG tablet Take 1 tablet (145 mg total) by mouth daily. 90 tablet 3  . lisinopril (PRINIVIL,ZESTRIL) 10 MG tablet Take 1 tablet (10 mg total) by mouth daily. 30 tablet 6  . metoprolol tartrate (LOPRESSOR) 25 MG tablet Take 1 tablet (25 mg total) by mouth 2 (two) times daily. 180 tablet 3  . nitroGLYCERIN (NITROSTAT) 0.4 MG SL tablet Place 0.4 mg under the  tongue every 5 (five) minutes as needed for chest pain.    . rosuvastatin (CRESTOR) 40 MG tablet Take 1 tablet (40 mg total) by mouth daily. 90 tablet 3  . ticagrelor (BRILINTA) 90 MG TABS tablet Take 1 tablet (90 mg total) by mouth 2 (two) times daily. 180 tablet 3  . varenicline (CHANTIX) 1 MG tablet Take 1 tablet (1 mg total) by mouth daily. 90 tablet 3   No Known Allergies  Assessment/Plan:  1. Hyperlipidemia - LDL unable to be calculated due to TG > 500 mg/dL. Will continue rosuvastatin 40mg  daily and fenofibrate 145mg  daily. Educated patient on link between elevated cholesterol and heart disease. Discussed the risk of pancreatitis and inability to follow LDL with markedly elevated triglycerides. Educated patient on diet options (low-carb, low-sugar, low-fat) to reduce triglycerides, and emphasized portion control and cutting back on sweet tea. Follow-up on July 17th for FLP since we do not have any lipid panel results on current lipid regimen. F/u July 18th with lipid clinic. If TG remain elevated, will add fish oil.  2. Medication Nonadherence - most likely due to cost from lack of prescription insurance. Given the patient's vitals today (BP 120/66, HR 56), it is likely that he took his medications for today's visit, but [facility], patient may not be taking his medications every day. Spent a significant time discussing the importance of each of his medications and setting up refills at [facility] for the patient. Patient's copays are as follows: fenofibrate ($6/month), rosuvastatin ($6/month), lisinopril ($4/month), metoprolol (free), Brilinta (free through PASS program, which patient has missed 2 appointments to establish). Provided patient with samples of Brilinta to cover for now (#16 tablets). Will contact Dr. Katrinka BlazingSmith about switching patient over to Plavix, which would be [facility] per month, since patient is >1 month from MI and has failed to follow  up multiple times for pt assistance with Brilinta. [facility] will refill lisinopril and metoprolol for free for patient to pick up tomorrow morning.   3. Smoking Cessation - cigarette use remains on ~3-4 per day. Continue Chantix 1mg  daily. Educated patient on proper use of nicotine gum if he wishes to use this in the future. Encouraged patient to quit smoking, and suggested to start thinking of a quit date for next appointment.    Allie BossierApryl Anderson, PharmD PGY1 Resident 04/16/2017 11:37 AM  Patient seen with: Margaretmary DysMegan Supple, PharmD, CPP, [facility] [address], [place], [place] [address] Phone: [phone](336) [phone]; Fax: [phone]) [phone]

## 2017-04-16 NOTE — Patient Instructions (Addendum)
It was great to meet you today!  Continue all of your medications as prescribed. Pick up l[facility] tomorrow morning. We will talk to Dr. Katrinka BlazingSmith about Brilinta vs Plavix.  Diet is the most important focus for lowering your triglycerides - use the handouts provided

## 2017-04-17 ENCOUNTER — Telehealth: Payer: Self-pay

## 2017-04-17 MED ORDER — CLOPIDOGREL BISULFATE 75 MG PO TABS: 75.0000 mg | ORAL_TABLET | Freq: Every day | ORAL | 3 refills | Status: DC

## 2017-04-17 MED FILL — ?CLOPIDOGREL 75 MG TABLET: 75 | 30 days supply | Qty: 30 | Fill #0

## 2017-04-17 NOTE — Progress Notes (Signed)
Yes, please switch to Plavix 75 mg daily

## 2017-04-17 NOTE — Telephone Encounter (Addendum)
Called patient to inform of switch from Brilinta to Plavix for cost. Educated on instructions (once daily) and that Plavix replaces Brilinta - patient voiced understanding and had no questions.   [facility]   ----- Message from Lyn RecordsHenry W Smith, MD sent at 04/17/2017  4:13 PM EDT ----- Yes, please switch to Plavix 75 mg daily  ----- Message ----- From: [name] GhaziAnderson, Carianna Lague N, [facility] Sent: 04/16/2017  12:01 PM To: Lyn RecordsHenry W Smith, MD  Please see note about patient's Brilinta nonadherence. He has failed multiple times to follow through with [facility] for patient [facility] program. Plavix would be free at their pharmacy without additional paperwork/patient follow through, we would just need an active prescription sent over.   Would you be okay with switching patient over to Plavix? (STEMI 01/2017 - stents placed 4/7 & 4/9)

## 2017-05-05 MED FILL — ROSUVASTATIN CALCIUM 40 MG: 40 | 30 days supply | Qty: 30 | Fill #1

## 2017-05-05 MED FILL — ?FENOFIBRATE 145 MG TABLET: 145 | 30 days supply | Qty: 30 | Fill #1

## 2017-05-13 ENCOUNTER — Other Ambulatory Visit: Payer: Self-pay

## 2017-05-13 DIAGNOSIS — I25119 Atherosclerotic heart disease of native coronary artery with unspecified angina pectoris: Secondary | ICD-10-CM

## 2017-05-13 DIAGNOSIS — Z79899 Other long term (current) drug therapy: Secondary | ICD-10-CM

## 2017-05-13 DIAGNOSIS — E785 Hyperlipidemia, unspecified: Secondary | ICD-10-CM

## 2017-05-13 LAB — LIPID PANEL
CHOL/HDL RATIO: 10.5 ratio — AB (ref 0.0–5.0)
Cholesterol, Total: 293 mg/dL — ABNORMAL HIGH (ref 100–199)
HDL: 28 mg/dL — AB (ref >39)
Triglycerides: 1833 mg/dL (ref 0–149)

## 2017-05-13 LAB — HEPATIC FUNCTION PANEL
ALT: 26 IU/L (ref 0–44)
AST: 23 IU/L (ref 0–40)
Albumin: 4.6 g/dL (ref 3.5–5.5)
Alkaline Phosphatase: 75 IU/L (ref 39–117)
Bilirubin Total: <0.2 mg/dL (ref 0.0–1.2)
Bilirubin, Direct: 0.1 mg/dL (ref 0.00–0.40)
Total Protein: 7 g/dL (ref 6.0–8.5)

## 2017-05-14 ENCOUNTER — Telehealth: Payer: Self-pay | Admitting: *Deleted

## 2017-05-14 ENCOUNTER — Ambulatory Visit (INDEPENDENT_AMBULATORY_CARE_PROVIDER_SITE_OTHER): Payer: Self-pay | Admitting: Pharmacist

## 2017-05-14 DIAGNOSIS — Z79899 Other long term (current) drug therapy: Secondary | ICD-10-CM

## 2017-05-14 DIAGNOSIS — E785 Hyperlipidemia, unspecified: Secondary | ICD-10-CM

## 2017-05-14 NOTE — Telephone Encounter (Signed)
-----   Message from Lyn RecordsHenry W Smith, MD sent at 05/13/2017  5:59 PM EDT ----- Let the patient know is he taking rosuvastatin and Lopid? If so, please refer to the lipid clinic. His lipid profile is very abnormal. If he is drinking alcohol he needs to discontinue as this may be driving up his triglyceride. If he is drinking significant alcohol, blood work needs to be repeated after he has been off alcohol for 2 weeks. A TSH needs to be done at that time as well A copy will be sent to Pt, No Pcp Per (Inactive)

## 2017-05-14 NOTE — Progress Notes (Signed)
Patient ID: Anthony Coffey                 DOB: [birth year]                    MRN: [id number]     HPI: [name] is a 61 y.o. male patient referred to lipid clinic by Dr. Katrinka Blazing. PMH is significant for CAD (STEMI 01/2017 w/ OM DES and mid LAD DES), HTN, COPD, and anxiety. At last office visit with Dr. Katrinka Blazing on 03/25/17, a lipid panel was collected and triglycerides were very elevated at 927 mg/dL. Patient was started on fenofibrate for hypertriglyceridemia, and switched from atorvastatin to rosuvastatin for less drug-drug interaction with fibrates. Of note patient does not have prescription coverage and gets medications from [facility] and [facility].   Patient presents today for lipid clinic follow up. At his most recent visit in lipid clinic it was noted that patient appeared to be nonadherent to medications and not overly engaged with medical care (see previous note for additional details). He was unsure of indications and had missed several appts with [facility] [employer]. He also was found to be smoking while taking Chantix. He was educated on healthy lifestyle and encouraged to pick a quit date. Since this time he his lipid panel was repeated and actually showed an increase in TG (LDL still unable to calc).   Today he reports he has been without medication for about a week (though it seems it may have been longer). He also reports that he had 4 beers on Saturday, 2 beers on Sunday, and 2 beers on Monday. He states he does not drink everyday though. He states he was called from our office this morning and set up for follow up labs on Aug 2nd. He also reports that he has not had potatoes or rice in several weeks and he does not eat sweet foods regularly.   He also reports that he is still smoking 3-4 cigarettes, sometimes more. He has not thought about a quit date and still is uninterested in quitting completely as he states he can't sleep at night and needs the cigarettes to  get through the day.   Current Medications:  Rosuvastatin 40mg  daily - ran out about a week ago per pt report Fenofibrate 145mg  daily - ran out about a week ago per patient report  Intolerances: none  Risk Factors: CAD, STEMI, HTN, male  LDL goal: < 70 mg/dL   Diet: No salt added diet. Breakfast: 2 boiled eggs, Lunch: [place] sandwich, Dinner: peas, corns, chicken, beef, Snacks: crackers. Drinks: 2 glasses sweet tea per day (1 cup sugar/ 0.5 gallon), diet Mt. Dew.  Exercise: Walks ~1 mile every day.   Family History: The patient's family history includes Anxiety disorder in his brother.   Social History: Current smoker (3-4 cigarettes/day), reports alcohol use (1 beer 2-3 times/wk), denies illicit drug use. Patient helps his mother and is usually unavailable on Thursdays and Fridays.   Labs: 05/13/2017 - TC 293, TG 1833, HDL 28, LDL uncalc, LFTs WNL (on crestor 40mg  and fenofibrate 145mg  daily?) 03/25/2017 - TC 191, TG 927, HDL 35, LDL uncalc, LFTs wnl (on atorvastatin 80mg  daily)  Past Medical History:  Diagnosis Date  . Aneurysm (HCC)   . Anxiety   . CAD (coronary artery disease)    02/01/17 Cath DES--> OM, staged PCI DES -->mLAD, EF 55%  . COPD (chronic obstructive pulmonary disease) (HCC)  smoker  . Hypertension    high with anxiety  . Myocardial infarction [facility](HCC)     Current Outpatient Prescriptions on File Prior to Visit  Medication Sig Dispense Refill  . acetaminophen (TYLENOL) 500 MG tablet Take 1 tablet (500 mg total) by mouth every 6 (six) hours as needed for headache (pain). 30 tablet 0  . aspirin 81 MG chewable tablet Chew 1 tablet (81 mg total) by mouth daily.    . fenofibrate (TRICOR) 145 MG tablet Take 1 tablet (145 mg total) by mouth daily. 90 tablet 3  . lisinopril (PRINIVIL,ZESTRIL) 10 MG tablet Take 1 tablet (10 mg total) by mouth daily. 30 tablet 6  . metoprolol tartrate (LOPRESSOR) 25 MG tablet Take 1 tablet (25 mg total) by mouth 2 (two) times daily. 180  tablet 3  . nitroGLYCERIN (NITROSTAT) 0.4 MG SL tablet Place 0.4 mg under the tongue every 5 (five) minutes as needed for chest pain.    . rosuvastatin (CRESTOR) 40 MG tablet Take 1 tablet (40 mg total) by mouth daily. 90 tablet 3  . ticagrelor (BRILINTA) 90 MG TABS tablet Take 1 tablet (90 mg total) by mouth 2 (two) times daily. 180 tablet 3  . varenicline (CHANTIX) 1 MG tablet Take 1 tablet (1 mg total) by mouth daily. 90 tablet 3   No Known Allergies  Assessment/Plan:  1. Hyperlipidemia - RESTART rosuvastatin 40mg  and fenofibrate 145mg  daily ([facility]). Advised to avoid alcohol completely as well as continue to watch starches and sweets to help with TG. Pt again educated on need for medications and diet control. Follow up in lipid clinic after next lipid panel on Aug 2nd.   2. Smoking Cessation: Encouraged again to pick quit date and think about why quitting is important to him.     Thank you, Freddie ApleyKelley M. Cleatis PolkaAuten, PharmD  [facility]  [address], [place], [place] [address]  Phone: [phone](336) [phone]; Fax: [phone]) [phone] 05/14/2017 7:39 AM

## 2017-05-14 NOTE — Patient Instructions (Addendum)
AVOID alcohol and starches (white potatoes and rices) to help with Triglycerides  RESTART your rosuvastatin 40mg  daily and fenofibrate 145mg  daily (take this one with food) - pick these up from the pharmacy as soon as possible.

## 2017-05-16 ENCOUNTER — Telehealth: Payer: Self-pay | Admitting: Interventional Cardiology

## 2017-05-16 NOTE — Telephone Encounter (Signed)
Follow Up:; ° ° °Returning your call. °

## 2017-05-16 NOTE — Telephone Encounter (Signed)
Spoke with pt and made him aware of recommendations to do labs in 6 weeks instead of 2. Rescheduled pt to 07/01/17. Pt verbalized understanding and was in agreement with this plan.

## 2017-05-21 MED FILL — ?CLOPIDOGREL 75MG TAB: 75 | 30 days supply | Qty: 30 | Fill #1

## 2017-05-21 MED FILL — ?METOPROLOL 25 MG TABLET: 25 | 30 days supply | Qty: 60 | Fill #3

## 2017-05-21 MED FILL — LISINOPRIL 10 MG TABLET: 10 | 30 days supply | Qty: 30 | Fill #3

## 2017-05-29 ENCOUNTER — Other Ambulatory Visit: Payer: Self-pay

## 2017-06-03 ENCOUNTER — Encounter (INDEPENDENT_AMBULATORY_CARE_PROVIDER_SITE_OTHER): Payer: Self-pay

## 2017-06-03 ENCOUNTER — Ambulatory Visit (INDEPENDENT_AMBULATORY_CARE_PROVIDER_SITE_OTHER): Payer: Self-pay | Admitting: Pharmacist

## 2017-06-03 DIAGNOSIS — Z720 Tobacco use: Secondary | ICD-10-CM

## 2017-06-03 DIAGNOSIS — E785 Hyperlipidemia, unspecified: Secondary | ICD-10-CM

## 2017-06-03 NOTE — Patient Instructions (Addendum)
Continue taking your cholesterol medicine (rosuvastatin and fenofibrate each day)  Start taking fish oil - look for the 1,000mg  capsules and take 2 each day  Continue to walk every other day - see if you can start walking a little farther than a mile  Continue to limit alcohol, carbohydrates, and sweets in your diet - this will help lower your triglycerides  Look for nicotine gum 2mg  to help with your cigarette cravings  Recheck cholesterol on Tuesday, September 4th. Come in any time after 7:30am for fasting blood work.

## 2017-06-03 NOTE — Progress Notes (Signed)
Patient ID: EMET RAFANAN                 DOB: January 11, 1956                    MRN: [id number]     HPI: Anthony Coffey is a 61 y.o. male patient referred to lipid clinic by Dr. Katrinka Blazing. PMH is significant for CAD (STEMI 01/2017 w/ OM DES and mid LAD DES), HTN, COPD, and anxiety. At last office visit with Dr. Katrinka Blazing on 03/25/17, a lipid panel was collected and triglycerides were very elevated at 927 mg/dL. Patient was started on fenofibrate for hypertriglyceridemia, and switched from atorvastatin to rosuvastatin for less drug-drug interaction with fibrates. Of note patient does not have prescription coverage and gets medications from [facility] and [facility] At last visit, pt reported running out of his cholesterol medications and was advised to restart his rosuvastatin and fenofibrate. He presents today for follow up. Of note, follow up lipid panel was moved out to 9/4.  Pt reports feeling well overall. He reports compliance with his rosuvastatin and fenofibrate. Stressed the importance of adherence with his medications and he states he has not missed any doses.  Pt has been focusing on eating well. He will have a boiled egg, fruit, or oatmeal in the AM. Has a [place] sandwich for dinner. He has switched from sweet tea to unsweet tea and uses artifical sweetener. Also drinks diet soda and water. Limits his alcohol to 1-2 beers per week. He has been walking 1 mile every other day in the evening after supper. He has been trying to limit carbohydrates and sugar intake.  He reports that he is still smoking 3-5 cigarettes. He states he has been thinking about quitting smoking to improve his health. He has tried Chantix in the past but did not like the dreams associated with therapy. He has not successfully quit smoking in the past. He did cut back on his cigarette use for 2 weeks in the past and stated that eating more helped him cut back on tobacco use.  Current Medications: Rosuvastatin 40mg  daily,  fenofibrate 145mg  daily Intolerances: none Risk Factors: CAD, STEMI, HTN, male LDL goal: < 70 mg/dL TG goal: 150mg /dL  Diet: Avoids adding salt to his diet. Breakfast: boiled egg, oatmeal, or fruit. Lunch: [place] sandwich, Dinner: peas, corns, chicken, beef, Snacks: crackers. Drinks: 2 glasses sweet tea per day (1 cup sugar/ 0.5 gallon), diet Mt. Dew.  Exercise: Walks ~1 mile every other day.   Family History: The patient's family history includes Anxiety disorder in his brother.  Social History: Current smoker (3-4 cigarettes/day), reports alcohol use (1 beer 2-3 times/wk), denies illicit drug use. Patient helps his mother and is usually unavailable on Thursdays and Fridays.   Labs: 05/13/2017 - TC 293, TG 1833, HDL 28, LDL uncalc, LFTs WNL (on crestor 40mg  and fenofibrate 145mg  daily but had run out of his medication for 1 week) 03/25/2017 - TC 191, TG 927, HDL 35, LDL uncalc, LFTs wnl (on atorvastatin 80mg  daily) 02/03/2017: Hgb A1c = 5.5%  Past Medical History:  Diagnosis Date  . Aneurysm (HCC)   . Anxiety   . CAD (coronary artery disease)    02/01/17 Cath DES--> OM, staged PCI DES -->mLAD, EF 55%  . COPD (chronic obstructive pulmonary disease) (HCC)    smoker  . Hypertension    high with anxiety  . Myocardial infarction [facility])     Current Outpatient Prescriptions on File  Prior to Visit  Medication Sig Dispense Refill  . acetaminophen (TYLENOL) 500 MG tablet Take 1 tablet (500 mg total) by mouth every 6 (six) hours as needed for headache (pain). 30 tablet 0  . aspirin 81 MG chewable tablet Chew 1 tablet (81 mg total) by mouth daily.    . clopidogrel (PLAVIX) 75 MG tablet Take 1 tablet (75 mg total) by mouth daily. 90 tablet 3  . fenofibrate (TRICOR) 145 MG tablet Take 1 tablet (145 mg total) by mouth daily. 90 tablet 3  . lisinopril (PRINIVIL,ZESTRIL) 10 MG tablet Take 1 tablet (10 mg total) by mouth daily. 30 tablet 6  . metoprolol tartrate (LOPRESSOR) 25 MG tablet Take 1  tablet (25 mg total) by mouth 2 (two) times daily. 180 tablet 3  . nitroGLYCERIN (NITROSTAT) 0.4 MG SL tablet Place 0.4 mg under the tongue every 5 (five) minutes as needed for chest pain.    . rosuvastatin (CRESTOR) 40 MG tablet Take 1 tablet (40 mg total) by mouth daily. 90 tablet 3  . varenicline (CHANTIX) 1 MG tablet Take 1 tablet (1 mg total) by mouth daily. 90 tablet 3   No current facility-administered medications on file prior to visit.     No Known Allergies  Assessment/Plan:  1. Hypertriglyceridemia - Lipid panel drawn in July shows elevated TG of 1833. Since then, pt has cut back on alcohol intake to 1-2 beers per week. He has been watching his carbohydrate and sugar intake and has switched from sweet tea to unsweet tea. Stressed importance of compliance with his medications. He reports no missed doses of his rosuvastatin 40mg  daily or fenofibrate 145mg  daily. Also encouraged pt to start fish oil 2,000mg  daily which he will purchase over the counter. Encouraged pt to increase his walking. F/u lipid panel scheduled in 1 month.  2. Tobacco abuse - Pt currently smoking 3-5 cigarettes per day and is now interested in quitting to improve his health. He will work to cut back on tobacco use. He has previously used Chantix but did not like the side effects. Encouraged pt to look for OTC nicotine gum 2mg  to help with his cravings.   Megan E. Supple, PharmD, CPP, BCACP [facility] [address], [place], [place] [address] Phone: [phone](336) [phone]; Fax: [phone](336) [phone] 06/03/2017 10:48 AM

## 2017-06-24 MED FILL — ?METOPROLOL 25 MG TABLET: 25 | 30 days supply | Qty: 60 | Fill #4

## 2017-06-24 MED FILL — ATORVASTATIN 80 MG TABLET: 80 | 30 days supply | Qty: 30 | Fill #2

## 2017-06-24 MED FILL — ?FENOFIBRATE 145 MG TABLET: 145 | 30 days supply | Qty: 30 | Fill #2

## 2017-06-24 MED FILL — LISINOPRIL 10 MG TABLET: 10 | 30 days supply | Qty: 30 | Fill #4

## 2017-07-01 ENCOUNTER — Other Ambulatory Visit: Payer: Self-pay | Admitting: *Deleted

## 2017-07-01 DIAGNOSIS — Z79899 Other long term (current) drug therapy: Secondary | ICD-10-CM

## 2017-07-01 LAB — HEPATIC FUNCTION PANEL
ALT: 22 IU/L (ref 0–44)
AST: 18 IU/L (ref 0–40)
Albumin: 4.7 g/dL (ref 3.5–5.5)
Alkaline Phosphatase: 59 IU/L (ref 39–117)
BILIRUBIN TOTAL: 0.3 mg/dL (ref 0.0–1.2)
BILIRUBIN, DIRECT: 0.09 mg/dL (ref 0.00–0.40)
Total Protein: 7.4 g/dL (ref 6.0–8.5)

## 2017-07-01 LAB — LIPID PANEL
CHOLESTEROL TOTAL: 188 mg/dL (ref 100–199)
Chol/HDL Ratio: 6.3 ratio — ABNORMAL HIGH (ref 0.0–5.0)
HDL: 30 mg/dL — AB (ref >39)
TRIGLYCERIDES: 696 mg/dL — AB (ref 0–149)

## 2017-07-01 LAB — TSH: TSH: 1.03 u[IU]/mL (ref 0.450–4.500)

## 2017-07-03 ENCOUNTER — Telehealth: Payer: Self-pay | Admitting: Pharmacist

## 2017-07-03 DIAGNOSIS — E785 Hyperlipidemia, unspecified: Secondary | ICD-10-CM

## 2017-07-03 NOTE — Telephone Encounter (Signed)
Followed up with pt regarding lipid panel results. He is adherent to Crestor  daily, fenofibrate  daily, and fish oil 1g daily. His TG have improved from 1833 to 696. Will increase fish oil to 4g daily and recheck lipid panel with direct LDL in 3 months. Pt is in agreement with plan.

## 2017-07-04 NOTE — Progress Notes (Signed)
Great - thanks

## 2017-10-06 ENCOUNTER — Other Ambulatory Visit: Payer: Self-pay

## 2017-10-08 ENCOUNTER — Other Ambulatory Visit: Payer: Self-pay

## 2017-10-14 ENCOUNTER — Other Ambulatory Visit: Payer: Self-pay | Admitting: *Deleted

## 2017-10-14 DIAGNOSIS — E785 Hyperlipidemia, unspecified: Secondary | ICD-10-CM

## 2017-10-14 LAB — LIPID PANEL
Chol/HDL Ratio: 6.9 ratio — ABNORMAL HIGH (ref 0.0–5.0)
Cholesterol, Total: 214 mg/dL — ABNORMAL HIGH (ref 100–199)
HDL: 31 mg/dL — AB (ref >39)
Triglycerides: 560 mg/dL (ref 0–149)

## 2017-10-14 LAB — LDL CHOLESTEROL, DIRECT: LDL Direct: 113 mg/dL — ABNORMAL HIGH (ref 0–99)

## 2017-10-16 ENCOUNTER — Telehealth: Payer: Self-pay | Admitting: Pharmacist

## 2017-10-16 MED ORDER — NIACIN ER (ANTIHYPERLIPIDEMIC) 500 MG PO TBCR: 500.0000 mg | EXTENDED_RELEASE_TABLET | Freq: Every day | ORAL | 11 refills | Status: DC

## 2017-10-16 NOTE — Telephone Encounter (Signed)
Spoke with pt regarding lipid panel results. TG remain elevated at 560 despite pt taking Crestor 40mg  daily, fish oil 4g daily, and fenofibrate 145mg  daily. Pt reports adherence to all medications and is trying to limit carbs, sugar, and alcohol in his diet.  Discussed clinical trial of krill oil vs starting low dose niacin and pt prefers to try niacin. Sent in rx for Niaspan 500mg  once daily. Will call pt in 1 month to assess tolerability and will plan to increase to 500mg  BID if tolerating well. Will schedule f/u lipid panel at that time.

## 2017-12-02 ENCOUNTER — Telehealth: Payer: Self-pay | Admitting: Pharmacist

## 2017-12-02 DIAGNOSIS — E782 Mixed hyperlipidemia: Secondary | ICD-10-CM

## 2017-12-02 MED ORDER — NIACIN ER (ANTIHYPERLIPIDEMIC) 500 MG PO TBCR: 500.0000 mg | EXTENDED_RELEASE_TABLET | Freq: Two times a day (BID) | ORAL | 11 refills | Status: DC

## 2017-12-02 NOTE — Telephone Encounter (Signed)
Called pt to follow up with tolerability of Niaspan 500mg  daily. Pt reports tolerating it well. Will increase to 500mg  BID. Scheduled f/u lipid panel and LFTs in 6 weeks. New rx sent to pharmacy with higher frequency.

## 2018-01-13 ENCOUNTER — Other Ambulatory Visit: Payer: Self-pay | Admitting: *Deleted

## 2018-01-13 DIAGNOSIS — E782 Mixed hyperlipidemia: Secondary | ICD-10-CM

## 2018-01-13 LAB — HEPATIC FUNCTION PANEL
ALT: 26 IU/L (ref 0–44)
AST: 20 IU/L (ref 0–40)
Albumin: 4.4 g/dL (ref 3.5–5.5)
Alkaline Phosphatase: 80 IU/L (ref 39–117)
BILIRUBIN, DIRECT: 0.11 mg/dL (ref 0.00–0.40)
Bilirubin Total: 0.3 mg/dL (ref 0.0–1.2)
TOTAL PROTEIN: 6.8 g/dL (ref 6.0–8.5)

## 2018-01-13 LAB — LIPID PANEL
CHOL/HDL RATIO: 16 ratio — AB (ref 0.0–5.0)
Cholesterol, Total: 367 mg/dL — ABNORMAL HIGH (ref 100–199)
HDL: 23 mg/dL — AB (ref >39)
Triglycerides: 1471 mg/dL (ref 0–149)

## 2018-01-21 ENCOUNTER — Ambulatory Visit (INDEPENDENT_AMBULATORY_CARE_PROVIDER_SITE_OTHER): Payer: Self-pay | Admitting: Pharmacist

## 2018-01-21 DIAGNOSIS — E785 Hyperlipidemia, unspecified: Secondary | ICD-10-CM

## 2018-01-21 DIAGNOSIS — E781 Pure hyperglyceridemia: Secondary | ICD-10-CM | POA: Insufficient documentation

## 2018-01-21 MED ORDER — NIACIN ER (ANTIHYPERLIPIDEMIC) 1000 MG PO TBCR: 1000.0000 mg | EXTENDED_RELEASE_TABLET | Freq: Two times a day (BID) | ORAL | 11 refills | Status: DC

## 2018-01-21 NOTE — Progress Notes (Signed)
Patient ID: Anthony Coffey                 DOB: 1956/06/23                    MRN: [id number]     HPI: Anthony Coffey is a 62 y.o. male patient referred to lipid clinic by Dr. Katrinka Blazing. PMH is significant for CAD (STEMI 01/2017 w/ OM DES and mid LAD DES), HTN, COPD, and anxiety. Pt was seen most recently in lipid clinic in September 2018. At that time, pt was taking rosuvastatin 40mg  daily and fenofibrate 145mg  daily. He was started on fish oil 4g daily. TG remained elevated at 560 and pt was started on niacin, titrated to 500mg  BID. F/u lipid panel last week revealed TG of 1471. Pt presents today for follow up.  Pt reports adherence to all of his lipid medications. He endorses poor eating habits. He drinks 3-4 cups of sweet tea each day, likes ice cream, and has been eating more potatoes than normal. He does not drink alcohol. He has been trying to stay active. He continues to smoke 5-6 cigarettes per day. He has not tried OTC nicotine gum as we discussed at last visit. Of note, pt does not have insurance and uses [facility] for his prescriptions.  Current Medications: rosuvastatin 40mg  daily, fenofibrate 145mg  daily, fish oil 4g daily, Niaspan 500mg  BID Intolerances: none Risk Factors: CAD, STEMI, HTN, male LDL goal: < 70 mg/dL TG goal: 150mg /dL  Diet: Avoids adding salt to his diet. Breakfast: boiled egg, oatmeal, or fruit. Lunch: [place] sandwich, Dinner: peas, corns, chicken, beef, Snacks: crackers. Drinks: 2 glasses sweet tea per day (1 cup sugar/ 0.5 gallon), diet Mt. Dew.  Exercise: Walks ~1 mile every other day.   Family History: The patient's family history includes Anxiety disorder in his brother.  Social History: Current smoker (3-4 cigarettes/day), reports alcohol use (1 beer 2-3 times/wk), denies illicit drug use. Patient helps his mother and is usually unavailable on Thursdays and Fridays.   Labs: 05/13/2017 - TC 293, TG 1833, HDL 28, LDL uncalc, LFTs WNL (on  crestor 40mg  and fenofibrate 145mg  daily but had run out of his medication for 1 week) 03/25/2017 - TC 191, TG 927, HDL 35, LDL uncalc, LFTs wnl (on atorvastatin 80mg  daily) 02/03/2017: Hgb A1c = 5.5%  Past Medical History:  Diagnosis Date  . Aneurysm (HCC)   . Anxiety   . CAD (coronary artery disease)    02/01/17 Cath DES--> OM, staged PCI DES -->mLAD, EF 55%  . COPD (chronic obstructive pulmonary disease) (HCC)    smoker  . Hypertension    high with anxiety  . Myocardial infarction [facility])     Current Outpatient Medications on File Prior to Visit  Medication Sig Dispense Refill  . acetaminophen (TYLENOL) 500 MG tablet Take 1 tablet (500 mg total) by mouth every 6 (six) hours as needed for headache (pain). 30 tablet 0  . aspirin 81 MG chewable tablet Chew 1 tablet (81 mg total) by mouth daily.    . clopidogrel (PLAVIX) 75 MG tablet Take 1 tablet (75 mg total) by mouth daily. 90 tablet 3  . fenofibrate (TRICOR) 145 MG tablet Take 1 tablet (145 mg total) by mouth daily. 90 tablet 3  . lisinopril (PRINIVIL,ZESTRIL) 10 MG tablet Take 1 tablet (10 mg total) by mouth daily. 30 tablet 6  . metoprolol tartrate (LOPRESSOR) 25 MG tablet Take 1 tablet (25 mg total)  by mouth 2 (two) times daily. 180 tablet 3  . niacin (NIASPAN) 500 MG CR tablet Take 1 tablet (500 mg total) by mouth 2 (two) times daily. 60 tablet 11  . nitroGLYCERIN (NITROSTAT) 0.4 MG SL tablet Place 0.4 mg under the tongue every 5 (five) minutes as needed for chest pain.    . Omega-3 Fatty Acids (FISH OIL) 1000 MG CAPS Take 4 capsules (4,000 mg total) by mouth daily.  0  . rosuvastatin (CRESTOR) 40 MG tablet Take 1 tablet (40 mg total) by mouth daily. 90 tablet 3  . varenicline (CHANTIX) 1 MG tablet Take 1 tablet (1 mg total) by mouth daily. 90 tablet 3   No current facility-administered medications on file prior to visit.     No Known Allergies  Assessment/Plan:  1. Hypertriglyceridemia - Lipid panel drawn last week shows TG  increased from 560 to 1471. Pt reports poor eating habits including eating ice cream, more potatoes, and drinking 3-4 cups of sweet tea each day. Discussed switching to sugar-free desserts and unsweet tea. He has not been drinking alcohol. Stressed importance of adherence to medications. Will increase Niaspan to 1,000mg  BID and continue rosuvastatin 40mg  daily, fish oil 4g daily, and fenofibrate 145mg  daily. F/u lipid panel, LFTs, and direct LDL scheduled in 3 months.  2. Tobacco abuse - Pt continues to smoke 5-6 cigarettes per day. He remains interested in quitting however has not tried nicotine gum as discussed at last visit. Encouraged pt again to look for OTC nicotine gum 2mg  to help with his cravings. Will assess progress at next lipid visit.   Layney Gillson E. Sumner Boesch, PharmD, CPP, BCACP [facility] [address], [place], [place] [address] Phone: [phone]; Fax: [phone] 01/21/2018 7:20 AM

## 2018-01-21 NOTE — Patient Instructions (Addendum)
It was nice to see you today  Increase your Niaspan to 1,000mg  twice daily  Continue taking your rosuvastatin (Crestor), fenofibrate, and fish oil for your cholesterol  Look for unsweet tea and add Splenda to it. Look for sugar free ice cream. Try to limit your carbohydrates and look for whole grain carbs instead of white carbs  Use over the counter nicotine gum 2mg  as needed to help cut back on cigarette smoking  Recheck fasting cholesterol on May 28th any time after 7:30am  Follow up in lipid clinic on June 4th at 11am to discuss your results

## 2018-03-24 ENCOUNTER — Other Ambulatory Visit: Payer: Self-pay | Admitting: *Deleted

## 2018-03-24 ENCOUNTER — Encounter: Payer: Self-pay | Admitting: *Deleted

## 2018-03-24 DIAGNOSIS — E781 Pure hyperglyceridemia: Secondary | ICD-10-CM

## 2018-03-24 DIAGNOSIS — E785 Hyperlipidemia, unspecified: Secondary | ICD-10-CM

## 2018-03-24 LAB — LIPID PANEL
Chol/HDL Ratio: 8.9 ratio — ABNORMAL HIGH (ref 0.0–5.0)
Cholesterol, Total: 284 mg/dL — ABNORMAL HIGH (ref 100–199)
HDL: 32 mg/dL — ABNORMAL LOW (ref >39)
TRIGLYCERIDES: 817 mg/dL — AB (ref 0–149)

## 2018-03-24 LAB — HEPATIC FUNCTION PANEL
ALBUMIN: 4.7 g/dL (ref 3.6–4.8)
ALK PHOS: 70 IU/L (ref 39–117)
ALT: 28 IU/L (ref 0–44)
AST: 19 IU/L (ref 0–40)
Bilirubin Total: 0.3 mg/dL (ref 0.0–1.2)
Bilirubin, Direct: 0.08 mg/dL (ref 0.00–0.40)
Total Protein: 7.1 g/dL (ref 6.0–8.5)

## 2018-03-24 LAB — LDL CHOLESTEROL, DIRECT: LDL Direct: 131 mg/dL — ABNORMAL HIGH (ref 0–99)

## 2018-03-30 NOTE — Progress Notes (Signed)
Patient ID: Anthony Coffey                 DOB: 04-14-1956                    MRN: [id number]     HPI: Anthony Coffey is a 62 y.o. male patient referred to lipid clinic by Dr. Katrinka Coffey. PMH is significant for CAD (STEMI 01/2017 w/ OM DES and mid LAD DES), HTN, COPD, and anxiety. Pt was seen most recently in lipid clinic in September 2018. At that time, pt was taking rosuvastatin 40mg  daily and fenofibrate 145mg  daily. He was started on fish oil 4g daily. TG remained elevated at 560 and pt was started on niacin, titrated to 500mg  BID. F/u lipid panel revealed TG of 1471 and diet was discussed at length. Tobacco cessation was also discussed at length. His most recent lipid panel reveal improved, but still elevated TG.  Pt presents today for follow up.  He presents today and states that he has been out of his cholesterol medications for about 2 months. He states that he has not been taking any of his prescription medications (including lisinopril and metoprolol) due to living out of town and having to drive to town to pick up medications. The only medication he has been taking is the fish oil 2 capsules BID. He states that cost is not a factor and he would prefer to pick up medications from [facility] pharmacy as this is only a few miles from his home. He has however been working on his diet and rarely drinks now. He reports that he has been limiting sugars and carbs. He does endorse using butter.   He states that he could not tolerate his Chantix due to nightmares and being unable to sleep. He does not wish to restart this at this time.   Current Medications: rosuvastatin 40mg  daily, fenofibrate 145mg  daily, fish oil 4g daily, Niaspan 500mg  BID - has not been taking for about 1 month prior to labs Intolerances: none Risk Factors: CAD, STEMI, HTN, male LDL goal: < 70 mg/dL TG goal: 150mg /dL  Diet: Avoids adding salt to his diet. Breakfast: boiled egg, oatmeal, or fruit. Lunch: [place] sandwich, Dinner: peas,  corns, chicken, beef, Snacks: crackers. Drinks: 2 glasses sweet tea per day (1 cup sugar/ 0.5 gallon), diet Mt. Dew.  Exercise: Walks ~1 mile every other day.   Family History: The patient's family history includes Anxiety disorder in his brother.  Social History: Current smoker (3-4 cigarettes/day), reports alcohol use (1 beer 2-3 times/wk), denies illicit drug use. Patient helps his mother and is usually unavailable on Thursdays and Fridays.   Labs: 03/24/2018- TC 284, TG 817, HDL 32, LDL-d 131, LFTs WNL (on rosuvastatin 40mg  and fenofibrate 145mg  daily, fish oil 4g daily, niacin 500mg  BID) 05/13/2017 - TC 293, TG 1833, HDL 28, LDL uncalc, LFTs WNL (on crestor 40mg  and fenofibrate 145mg  daily but had run out of his medication for 1 week) 03/25/2017 - TC 191, TG 927, HDL 35, LDL uncalc, LFTs wnl (on atorvastatin 80mg  daily) 02/03/2017: Hgb A1c = 5.5%  Past Medical History:  Diagnosis Date  . Aneurysm (HCC)   . Anxiety   . CAD (coronary artery disease)    02/01/17 Cath DES--> OM, staged PCI DES -->mLAD, EF 55%  . COPD (chronic obstructive pulmonary disease) (HCC)    smoker  . Hypertension    high with anxiety  . Myocardial infarction [facility](HCC)     Current  Outpatient Medications on File Prior to Visit  Medication Sig Dispense Refill  . acetaminophen (TYLENOL) 500 MG tablet Take 1 tablet (500 mg total) by mouth every 6 (six) hours as needed for headache (pain). 30 tablet 0  . aspirin 81 MG chewable tablet Chew 1 tablet (81 mg total) by mouth daily.    . clopidogrel (PLAVIX) 75 MG tablet Take 1 tablet (75 mg total) by mouth daily. 90 tablet 3  . fenofibrate (TRICOR) 145 MG tablet Take 1 tablet (145 mg total) by mouth daily. 90 tablet 3  . lisinopril (PRINIVIL,ZESTRIL) 10 MG tablet Take 1 tablet (10 mg total) by mouth daily. 30 tablet 6  . metoprolol tartrate (LOPRESSOR) 25 MG tablet Take 1 tablet (25 mg total) by mouth 2 (two) times daily. 180 tablet 3  . niacin (NIASPAN) 1000 MG CR tablet  Take 1 tablet (1,000 mg total) by mouth 2 (two) times daily. 60 tablet 11  . nitroGLYCERIN (NITROSTAT) 0.4 MG SL tablet Place 0.4 mg under the tongue every 5 (five) minutes as needed for chest pain.    . Omega-3 Fatty Acids (FISH OIL) 1000 MG CAPS Take 4 capsules (4,000 mg total) by mouth daily.  0  . rosuvastatin (CRESTOR) 40 MG tablet Take 1 tablet (40 mg total) by mouth daily. 90 tablet 3  . varenicline (CHANTIX) 1 MG tablet Take 1 tablet (1 mg total) by mouth daily. 90 tablet 3   No current facility-administered medications on file prior to visit.     No Known Allergies  Assessment/Plan:  1. Hypertriglyceridemia - Most recent lipid panel with LDL and TG above goal likely secondary to noncompliance. Will resume prior medications -rosuvastatin 40mg  daily, fenofibrate 145mg  daily, fish oil 4g daily, Niaspan 500mg  BID - sent to pharmacy of patient request. Discussed importance of adherence. Will repeat panel with continued lifestyle modifications and with adherence to medications in 3 months and follow up in lipid clinic at that time. Have asked he restart his lisinopril and metoprolol as well and that he keep track of pressures to be sure they do not drop low. He will call with issues.   Thank you, Anthony Coffey. Anthony Coffey, PharmD  [facility]  [address], [place], [place] [address]  Phone: [phone]; Fax: [phone] 03/30/2018 2:52 PM

## 2018-03-31 ENCOUNTER — Ambulatory Visit (INDEPENDENT_AMBULATORY_CARE_PROVIDER_SITE_OTHER): Payer: Self-pay | Admitting: Pharmacist

## 2018-03-31 DIAGNOSIS — I10 Essential (primary) hypertension: Secondary | ICD-10-CM

## 2018-03-31 DIAGNOSIS — E785 Hyperlipidemia, unspecified: Secondary | ICD-10-CM

## 2018-03-31 DIAGNOSIS — I2102 ST elevation (STEMI) myocardial infarction involving left anterior descending coronary artery: Secondary | ICD-10-CM

## 2018-03-31 DIAGNOSIS — E781 Pure hyperglyceridemia: Secondary | ICD-10-CM

## 2018-03-31 MED ORDER — ROSUVASTATIN CALCIUM 40 MG PO TABS: 40.0000 mg | ORAL_TABLET | Freq: Every day | ORAL | 3 refills | Status: DC

## 2018-03-31 MED ORDER — LISINOPRIL 10 MG PO TABS: 10.0000 mg | ORAL_TABLET | Freq: Every day | ORAL | 6 refills | Status: DC

## 2018-03-31 MED ORDER — FENOFIBRATE 145 MG PO TABS: 145.0000 mg | ORAL_TABLET | Freq: Every day | ORAL | 3 refills | Status: DC

## 2018-03-31 MED ORDER — NIACIN ER (ANTIHYPERLIPIDEMIC) 1000 MG PO TBCR: 1000.0000 mg | EXTENDED_RELEASE_TABLET | Freq: Two times a day (BID) | ORAL | 11 refills | Status: DC

## 2018-03-31 MED ORDER — CLOPIDOGREL BISULFATE 75 MG PO TABS: 75.0000 mg | ORAL_TABLET | Freq: Every day | ORAL | 3 refills | Status: DC

## 2018-03-31 MED ORDER — METOPROLOL TARTRATE 25 MG PO TABS: 25.0000 mg | ORAL_TABLET | Freq: Two times a day (BID) | ORAL | 3 refills | Status: DC

## 2018-03-31 NOTE — Patient Instructions (Addendum)
RESTART your medication as prescribed. These have all been sent to [facility].   Please check on your blood pressure and call if it is low (less than 100/60)  Continue to decrease your sugars/carbs, fats, and alcohol    Cholesterol Cholesterol is a fat. Your body needs a small amount of cholesterol. Cholesterol (plaque) may build up in your blood vessels (arteries). That makes you more likely to have a heart attack or stroke. You cannot feel your cholesterol level. Having a blood test is the only way to find out if your level is high. Keep your test results. Work with your doctor to keep your cholesterol at a good level. What do the results mean?  Total cholesterol is how much cholesterol is in your blood.  LDL is bad cholesterol. This is the type that can build up. Try to have low LDL.  HDL is good cholesterol. It cleans your blood vessels and carries LDL away. Try to have high HDL.  Triglycerides are fat that the body can store or burn for energy. What are good levels of cholesterol?  Total cholesterol below 200.  LDL below 100 is good for people who have health risks. LDL below 70 is good for people who have very high risks.  HDL above 40 is good. It is best to have HDL of 60 or higher.  Triglycerides below 150. How can I lower my cholesterol? Diet Follow your diet program as told by your doctor.  Choose fish, white meat chicken, or [place]key that is roasted or baked. Try not to eat red meat, fried foods, sausage, or lunch meats.  Eat lots of fresh fruits and vegetables.  Choose whole grains, beans, pasta, potatoes, and cereals.  Choose olive oil, corn oil, or canola oil. Only use small amounts.  Try not to eat butter, mayonnaise, shortening, or palm kernel oils.  Try not to eat foods with trans fats.  Choose low-fat or nonfat dairy foods. ? Drink skim or nonfat milk. ? Eat low-fat or nonfat yogurt and cheeses. ? Try not to drink whole milk or cream. ? Try not  to eat ice cream, egg yolks, or full-fat cheeses.  Healthy desserts include angel food cake, ginger snaps, animal crackers, hard candy, popsicles, and low-fat or nonfat frozen yogurt. Try not to eat pastries, cakes, pies, and cookies.  Exercise Follow your exercise program as told by your doctor.  Be more active. Try gardening, walking, and taking the stairs.  Ask your doctor about ways that you can be more active.  Medicine  Take over-the-counter and prescription medicines only as told by your doctor. This information is not intended to replace advice given to you by your health care provider. Make sure you discuss any questions you have with your health care provider. Document Released: 01/10/2009 Document Revised: 05/15/2016 Document Reviewed: 04/25/2016 Elsevier Interactive Patient Education  [employer]2018 Elsevier Inc.

## 2018-06-30 ENCOUNTER — Encounter (INDEPENDENT_AMBULATORY_CARE_PROVIDER_SITE_OTHER): Payer: Self-pay

## 2018-06-30 ENCOUNTER — Other Ambulatory Visit: Payer: Self-pay | Admitting: *Deleted

## 2018-06-30 DIAGNOSIS — I2102 ST elevation (STEMI) myocardial infarction involving left anterior descending coronary artery: Secondary | ICD-10-CM

## 2018-06-30 LAB — HEPATIC FUNCTION PANEL
ALK PHOS: 61 IU/L (ref 39–117)
ALT: 34 IU/L (ref 0–44)
AST: 24 IU/L (ref 0–40)
Albumin: 5 g/dL — ABNORMAL HIGH (ref 3.6–4.8)
BILIRUBIN TOTAL: 0.3 mg/dL (ref 0.0–1.2)
Bilirubin, Direct: 0.08 mg/dL (ref 0.00–0.40)
Total Protein: 7 g/dL (ref 6.0–8.5)

## 2018-06-30 LAB — LIPID PANEL
CHOLESTEROL TOTAL: 188 mg/dL (ref 100–199)
Chol/HDL Ratio: 6.3 ratio — ABNORMAL HIGH (ref 0.0–5.0)
HDL: 30 mg/dL — AB (ref >39)
TRIGLYCERIDES: 643 mg/dL — AB (ref 0–149)

## 2018-06-30 LAB — LDL CHOLESTEROL, DIRECT: LDL DIRECT: 65 mg/dL (ref 0–99)

## 2018-07-07 ENCOUNTER — Ambulatory Visit (INDEPENDENT_AMBULATORY_CARE_PROVIDER_SITE_OTHER): Payer: Self-pay | Admitting: Pharmacist

## 2018-07-07 DIAGNOSIS — E785 Hyperlipidemia, unspecified: Secondary | ICD-10-CM

## 2018-07-07 DIAGNOSIS — E781 Pure hyperglyceridemia: Secondary | ICD-10-CM

## 2018-07-07 DIAGNOSIS — I10 Essential (primary) hypertension: Secondary | ICD-10-CM

## 2018-07-07 DIAGNOSIS — I2102 ST elevation (STEMI) myocardial infarction involving left anterior descending coronary artery: Secondary | ICD-10-CM

## 2018-07-07 MED ORDER — NITROGLYCERIN 0.4 MG SL SUBL: 0.4000 mg | SUBLINGUAL_TABLET | SUBLINGUAL | 3 refills | Status: DC | PRN

## 2018-07-07 MED ORDER — CLOPIDOGREL BISULFATE 75 MG PO TABS: 75.0000 mg | ORAL_TABLET | Freq: Every day | ORAL | 11 refills | Status: DC

## 2018-07-07 MED ORDER — ROSUVASTATIN CALCIUM 40 MG PO TABS: 40.0000 mg | ORAL_TABLET | Freq: Every day | ORAL | 11 refills | Status: DC

## 2018-07-07 MED ORDER — NIACIN ER (ANTIHYPERLIPIDEMIC) 1000 MG PO TBCR: 1000.0000 mg | EXTENDED_RELEASE_TABLET | Freq: Two times a day (BID) | ORAL | 11 refills | Status: DC

## 2018-07-07 MED ORDER — LISINOPRIL 10 MG PO TABS: 10.0000 mg | ORAL_TABLET | Freq: Every day | ORAL | 11 refills | Status: DC

## 2018-07-07 MED ORDER — METOPROLOL TARTRATE 25 MG PO TABS: 25.0000 mg | ORAL_TABLET | Freq: Two times a day (BID) | ORAL | 6 refills | Status: DC

## 2018-07-07 MED ORDER — FENOFIBRATE 160 MG PO TABS: 160.0000 mg | ORAL_TABLET | Freq: Every day | ORAL | 11 refills | Status: DC

## 2018-07-07 NOTE — Progress Notes (Signed)
Patient ID: Anthony Coffey                 DOB: Jan 21, 1956                    MRN: [id number]     HPI: Anthony Coffey is a 62 y.o. male patient referred to lipid clinic by Dr. Katrinka Blazing. PMH is significant for CAD (STEMI 01/2017 w/ OM DES and mid LAD DES), HTN, COPD, and anxiety. Pt was seen most recently in lipid clinic in September 2018. At that time, pt was taking rosuvastatin 40mg  daily and fenofibrate 145mg  daily. He was started on fish oil 4g daily. TG remained elevated at 560 and pt was started on niacin, titrated to 500mg  BID. F/u lipid panel revealed TG of 1471 and diet was discussed at length. Tobacco cessation was also discussed at length. At last visit, pt had run out of all of his medications for 2 months because the pharmacy he was filling at was located too far away. He was resumed on prior doses of rosuvastatin 40mg  daily, fenofibrate 145mg  daily, fish oil 4g daily, and Niaspan 1000mg  BID.  Pt presents today in good spirits. He reports tolerating his medications well and has resumed therapy since his last visit. He was using [facility] which has since closed and will need all of his prescriptions sent in to [facility] in [facility] which has been done.  He continues to abstain from alcohol. He cut back on his intake of sweet tea and currently drinks 1/2 cup every few days. He drinks diet soda and water and uses Sweet and Low to sweeten his coffee rather than regular sugar. Reports BP has been controlled since resuming lisinopril and metoprolol. He is currently without prescription insurance but is looking into this.   He states that he could not tolerate his Chantix due to nightmares and being unable to sleep. He does not wish to restart this at this time.   Current Medications: rosuvastatin 40mg  daily, fenofibrate 145mg  daily, fish oil 4g daily, Niaspan 1000mg  BID - has not been taking for about 1 month prior to labs Intolerances: none Risk Factors: CAD, STEMI, HTN, male LDL goal: < 70  mg/dL TG goal: 150mg /dL  Diet: Avoids adding salt to his diet. Breakfast: boiled egg, oatmeal, or fruit. Lunch: [place] sandwich, Dinner: peas, corns, chicken, beef, Snacks: crackers. Drinks water, 1/2 cup of sweet tea every few days, diet Mt. Dew. Uses sweet and low in his coffee.  Exercise: Walks ~1 mile every day.   Family History: The patient's family history includes Anxiety disorder in his brother.  Social History: Current smoker (3-4 cigarettes/day), reports alcohol use (1 beer 2-3 times/wk), denies illicit drug use. Patient helps his mother and is usually unavailable on Thursdays and Fridays.   Labs: 06/30/18 - TC 188, TG 643, HDL 30, LDL-D 65, LFTs WNL (resumed all prior meds) 03/24/2018- TC 284, TG 817, HDL 32, LDL-d 131, LFTs WNL (on rosuvastatin 40mg  and fenofibrate 145mg  daily, fish oil 4g daily, niacin 500mg  BID) 05/13/2017 - TC 293, TG 1833, HDL 28, LDL uncalc, LFTs WNL (on crestor 40mg  and fenofibrate 145mg  daily but had run out of his medication for 1 week) 03/25/2017 - TC 191, TG 927, HDL 35, LDL uncalc, LFTs wnl (on atorvastatin 80mg  daily) 02/03/2017: Hgb A1c = 5.5%  Past Medical History:  Diagnosis Date  . Aneurysm (HCC)   . Anxiety   . CAD (coronary artery disease)    02/01/17  Cath DES--> OM, staged PCI DES -->mLAD, EF 55%  . COPD (chronic obstructive pulmonary disease) (HCC)    smoker  . Hypertension    high with anxiety  . Myocardial infarction [facility])     Current Outpatient Medications on File Prior to Visit  Medication Sig Dispense Refill  . acetaminophen (TYLENOL) 500 MG tablet Take 1 tablet (500 mg total) by mouth every 6 (six) hours as needed for headache (pain). 30 tablet 0  . aspirin 81 MG chewable tablet Chew 1 tablet (81 mg total) by mouth daily.    . clopidogrel (PLAVIX) 75 MG tablet Take 1 tablet (75 mg total) by mouth daily. 90 tablet 3  . fenofibrate (TRICOR) 145 MG tablet Take 1 tablet (145 mg total) by mouth daily. 90 tablet 3  . lisinopril  (PRINIVIL,ZESTRIL) 10 MG tablet Take 1 tablet (10 mg total) by mouth daily. 30 tablet 6  . metoprolol tartrate (LOPRESSOR) 25 MG tablet Take 1 tablet (25 mg total) by mouth 2 (two) times daily. 60 tablet 3  . niacin (NIASPAN) 1000 MG CR tablet Take 1 tablet (1,000 mg total) by mouth 2 (two) times daily. 60 tablet 11  . nitroGLYCERIN (NITROSTAT) 0.4 MG SL tablet Place 0.4 mg under the tongue every 5 (five) minutes as needed for chest pain.    . Omega-3 Fatty Acids (FISH OIL) 1000 MG CAPS Take 4 capsules (4,000 mg total) by mouth daily.  0  . rosuvastatin (CRESTOR) 40 MG tablet Take 1 tablet (40 mg total) by mouth daily. 30 tablet 3   No current facility-administered medications on file prior to visit.     No Known Allergies  Assessment/Plan:  1. Hypertriglyceridemia/hyperlipidemia - LDL much improved since resuming lipid meds, 131 now down to 65 at goal < 70. TG slightly improved to 643, historically well over 1000. Discussed switching from OTC fish oil to Vascepa, however pt is currently without prescription insurance and [facility] does not currently have any patient assistance for uninsured patients. Encouraged pt to look for insurance plan since patients with commercial insurance will qualify for $9 copay card. Will increase fenofibrate from 145mg  to 160mg  daily and continue rosuvastatin 40mg  daily, fish oil 4g daily, and Niaspan 1,000mg  BID. Encouraged pt to continue with low carb/sugar/alcohol diet. He continues to walk 1 mile each day. Will recheck lipids in 3 months and f/u in lipid clinic after to discuss results.   Cinzia Devos E. Renarda Mullinix, PharmD, BCACP, CPP [facility] [address] [place], [place] [address] Phone: [phone]; Fax: [phone] 07/07/2018 10:26 AM

## 2018-07-07 NOTE — Patient Instructions (Addendum)
Increase your fenofibrate to 160mg  daily. Let us know if the cost is too expensive and we can switch back to the 145mg  dose  Continue taking your rosuvastatin 40mg  daily and fish oil 4g daily for your cholesterol. I will check with the drug company to see if we can get you on a newer formulation of the fish oil called Vascepa  Continue to limit carbs, sugar, and alcohol in your diet.   Look for an insurance plan to sign up for  Recheck fasting cholesterol Monday January 6th. Come in any time after 7:30am  Follow up in clinic to discuss your results Tuesday January 7th at [facility] in the lipid clinic with any questions #[phone]

## 2018-11-02 ENCOUNTER — Other Ambulatory Visit: Payer: Self-pay | Admitting: *Deleted

## 2018-11-02 DIAGNOSIS — E781 Pure hyperglyceridemia: Secondary | ICD-10-CM

## 2018-11-02 DIAGNOSIS — E785 Hyperlipidemia, unspecified: Secondary | ICD-10-CM

## 2018-11-02 NOTE — Progress Notes (Signed)
Patient ID: Anthony Coffey                 DOB: 09/01/1956                    MRN: [id number]     HPI: Anthony Coffey is a 63 y.o. male patient referred to lipid clinic by Dr. Katrinka Blazing. PMH is significant for CAD (STEMI 01/2017 w/ OM DES and mid LAD DES), HTN, COPD, and anxiety. Patient was last seen in lipid clinic in September 2019 and has been following with [place] since 2018. At last visit patient reported tolerance to all of his medications and fenofibrate was increased to 160 mg daily from 145 mg daily. His most recent lipid panel revealed severely elevated TG. Patient presents today for 6 month follow up.   Patient reports forgetting to take his medications about 2x/week, especially fenofibrate due to some upset stomach and larger pill size. When asked about wanting to decrease the dose of fenofibrate back to 145 mg, he preferred to continue on current dose. Patient takes his medications in the morning, which are located above his refrigerator. He expresses interest in using a pillbox to help with adherence.  Patient reports eating more sugar (ice cream and oatmeal cookies) and carbs from potatoes primarily. Discussed portion control on carbs and alternative sugar free ice cream. Patient voices understanding. Patient is still drinking diet soda and using Sweet and Low. Patient denies alcohol intake. Patient reports consuming a large amount of sugar the night before coming in for lipid check, although he was fasting in the morning of lab draw.  Patient states that he still does not have an insurance plan but is currently working on [employer].   Current Medications: Fenofibrate 160 mg daily, rosuvastatin 40 mg daily, fish oil 4 g daily, niaspan 1,000 mg BID Risk Factors: CAD, STEMI, HTN, male LDL goal: <70 mg/dL TG goal: <329 mg/dL  Diet: Avoids adding salt to his diet. Breakfast: boiled egg, oatmeal, or fruit. Lunch: [place] sandwich, Dinner: peas, corns, chicken, beef, Snacks:  crackers. Drinks water, 1/2 cup of sweet tea every few days, diet Mt. Dew. Uses sweet and low in his coffee.  Exercise: Walks ~1 mile every day.   Family History: The patient's family history includes Anxiety disorder in his brother.  Social History: Current smoker (3-4 cigarettes/day), denies illicit drug and alcohol use. Patient helps his mother and is usually unavailable on Thursdays and Fridays.   Labs: 11/02/18 - TC 441, TG 2894, HDL 17, LDL-D 85, LFTs WNL (Fenofibrate 160 mg daily, rosuvastatin 40 mg daily, fish oil 4 g daily, niaspan 1,000 mg BID) 06/30/18 - TC 188, TG 643, HDL 30, LDL-D 65, LFTs WNL (resumed all prior meds) 03/24/2018- TC 284, TG 817, HDL 32, LDL-d 131, LFTs WNL (on rosuvastatin 40mg  and fenofibrate 145mg  daily, fish oil 4g daily, niacin 500mg  BID) 05/13/2017 - TC 293, TG 1833, HDL 28, LDL uncalc, LFTs WNL (on crestor 40mg  and fenofibrate 145mg  daily but had run out of his medication for 1 week) 03/25/2017 - TC 191, TG 927, HDL 35, LDL uncalc, LFTs wnl (on atorvastatin 80mg  daily) 02/03/2017: Hgb A1c = 5.5%  Past Medical History:  Diagnosis Date  . Aneurysm (HCC)   . Anxiety   . CAD (coronary artery disease)    02/01/17 Cath DES--> OM, staged PCI DES -->mLAD, EF 55%  . COPD (chronic obstructive pulmonary disease) (HCC)    smoker  . Hypertension  high with anxiety  . Myocardial infarction [facility])     Current Outpatient Medications on File Prior to Visit  Medication Sig Dispense Refill  . acetaminophen (TYLENOL) 500 MG tablet Take 1 tablet (500 mg total) by mouth every 6 (six) hours as needed for headache (pain). 30 tablet 0  . aspirin 81 MG chewable tablet Chew 1 tablet (81 mg total) by mouth daily.    . clopidogrel (PLAVIX) 75 MG tablet Take 1 tablet (75 mg total) by mouth daily. 30 tablet 11  . fenofibrate 160 MG tablet Take 1 tablet (160 mg total) by mouth daily. 30 tablet 11  . lisinopril (PRINIVIL,ZESTRIL) 10 MG tablet Take 1 tablet (10 mg total) by mouth daily.  30 tablet 11  . metoprolol tartrate (LOPRESSOR) 25 MG tablet Take 1 tablet (25 mg total) by mouth 2 (two) times daily. 60 tablet 6  . niacin (NIASPAN) 1000 MG CR tablet Take 1 tablet (1,000 mg total) by mouth 2 (two) times daily. 60 tablet 11  . nitroGLYCERIN (NITROSTAT) 0.4 MG SL tablet Place 1 tablet (0.4 mg total) under the tongue every 5 (five) minutes as needed for chest pain. 25 tablet 3  . Omega-3 Fatty Acids (FISH OIL) 1000 MG CAPS Take 4 capsules (4,000 mg total) by mouth daily.  0  . rosuvastatin (CRESTOR) 40 MG tablet Take 1 tablet (40 mg total) by mouth daily. 30 tablet 11   No current facility-administered medications on file prior to visit.     No Known Allergies  Assessment/Plan:  1. Hypertriglyceridemia/Hyperlipidemia - LDL level above goal <70 and has increased from 65 to 85 since the last visit due to missed doses of rosuvastatin. TG level significantly increased to 2864 from 643 secondary to missed medication doses and dietary indiscretion with increased intake of sugar and carbs. Lipid lowering regimen is already optimized, will continue fenofibrate 160 mg daily, rosuvastatin 40 mg daily, fish oil 4 g daily, and Niaspan 1,000 mg BID. Encouraged patient to use a pillbox to stay adherent to his medications and to continue with low carb/sugar/alcohol diet. Will recheck lipids in 6 months and f/u lipid clinic after to discuss results. Advised pt to call clinic if he gets insurance coverage as we would be able to switch from OTC fish oil to Vascepa.  Seen in clinic by Marilu Favre, PharmD Candidate  Chameka Mcmullen E. Miyah Hampshire, PharmD, BCACP, CPP [facility] [address], [place], [place] [address] Phone: [phone]; Fax: [phone] 11/03/2018 10:30 AM

## 2018-11-03 ENCOUNTER — Ambulatory Visit (INDEPENDENT_AMBULATORY_CARE_PROVIDER_SITE_OTHER): Payer: Self-pay | Admitting: Pharmacist

## 2018-11-03 DIAGNOSIS — E781 Pure hyperglyceridemia: Secondary | ICD-10-CM

## 2018-11-03 DIAGNOSIS — E785 Hyperlipidemia, unspecified: Secondary | ICD-10-CM

## 2018-11-03 LAB — HEPATIC FUNCTION PANEL
ALK PHOS: 79 IU/L (ref 39–117)
ALT: 45 IU/L — AB (ref 0–44)
AST: 33 IU/L (ref 0–40)
Albumin: 4.3 g/dL (ref 3.6–4.8)
Bilirubin Total: <0.2 mg/dL (ref 0.0–1.2)
Bilirubin, Direct: 0.18 mg/dL (ref 0.00–0.40)
TOTAL PROTEIN: 6.4 g/dL (ref 6.0–8.5)

## 2018-11-03 LAB — LDL CHOLESTEROL, DIRECT: LDL Direct: 85 mg/dL (ref 0–99)

## 2018-11-03 LAB — LIPID PANEL
CHOLESTEROL TOTAL: 441 mg/dL — AB (ref 100–199)
Chol/HDL Ratio: 25.9 ratio — ABNORMAL HIGH (ref 0.0–5.0)
HDL: 17 mg/dL — ABNORMAL LOW (ref >39)
TRIGLYCERIDES: 2894 mg/dL — AB (ref 0–149)

## 2018-11-03 NOTE — Patient Instructions (Addendum)
It was nice to see you  Start using a pill box to help organize your medications - missing doses has increased your triglycerides a lot  Switch over to a sugar free ice cream  Decrease your portion size of potatoes  If you start on disability insurance, call [facility] #[phone] and we can change over your fish oil prescription  Follow up for fasting lab work in 6 months on Monday, July 20th any time after 7:30am  Follow up in clinic on Tuesday, July 21st at 10am to discuss your results

## 2019-04-17 ENCOUNTER — Other Ambulatory Visit: Payer: Self-pay | Admitting: Interventional Cardiology

## 2019-04-17 DIAGNOSIS — I10 Essential (primary) hypertension: Secondary | ICD-10-CM

## 2019-05-17 ENCOUNTER — Other Ambulatory Visit: Payer: Self-pay | Admitting: Pharmacist

## 2019-05-17 ENCOUNTER — Telehealth: Payer: Self-pay

## 2019-05-17 ENCOUNTER — Telehealth: Payer: Self-pay | Admitting: Interventional Cardiology

## 2019-05-17 ENCOUNTER — Other Ambulatory Visit: Payer: Self-pay | Admitting: *Deleted

## 2019-05-17 ENCOUNTER — Other Ambulatory Visit: Payer: Self-pay

## 2019-05-17 DIAGNOSIS — E785 Hyperlipidemia, unspecified: Secondary | ICD-10-CM

## 2019-05-17 NOTE — Telephone Encounter (Signed)
New Message     Called to confirm appt and answer covid questions, call was answered and disconnected

## 2019-05-17 NOTE — Progress Notes (Signed)
Patient ID: Anthony Coffey                 DOB: Oct 12, 1956                    MRN: [id number]     HPI: Anthony Coffey is a 63 y.o. male patient referred to lipid clinic by Dr. Katrinka BlazingSmith. PMH is significant for CAD (STEMI 01/2017 w/ OM DES and mid LAD DES), HTN, COPD, and anxiety. Patient was last seen in lipid clinic in January 2020 and has been following with us since 2018. At last visit patient tolerated most medications however struggled with adherence, especially fenofibrate due to GI upset and larger pill size.   His most recent lipid panel revealed extreme elevation in TG (increased from 2894 in 10/2018 to 3306 in 03/2019). Due to lab results, called patient's pharmacy to determine last fill dates for HLD medications which are listed as followed: fenofibrate (07/07/2018; 30 days), rosuvastatin (08/05/2018; 30 days), niacin (never filled/on hold). Since omega-3 fatty acids are OTC it was not possible to determine LF date through the pharmacy. Patient presents today for 6 month follow up.   When asked about LF dates, pt admits to non-adherence to HLD medications d/t not being able to afford medications. He was denied from the disability program application. He has noted in the past he generally takes his medications in the morning, which are located above his refrigerator. At last visit he mentioned wanting to use a pill box, however, mentions at current visit he does not fill it. Thoroughly discussed with pt the importance of taking medications (preventing pancreatitis, lowering ASCVD risk) and current options to afford healthcare ($4 [employer] list, applying to Medicaid).  Patient still reports frequently eating sugar (ice cream) and carbs from potatoes primarily. He mentions that he consistently eats [place] sandwiches. Discussed portion control on carbs and the importance of eating more fruits/vegetables. Patient voices understanding. Patient denies alcohol intake.   Pt also mentions that he lives with his  mother and is her caregiver. He has one brother who lives in [place], [place] (~[place] min away).   Current Medications: Fenofibrate 160 mg daily, rosuvastatin 40 mg daily, fish oil 4 g daily, niaspan 1,000 mg BID - has not been on therapies for multiple months Risk Factors: CAD, STEMI, HTN, male LDL goal: <70 mg/dL TG goal: <409<150 mg/dL  Diet: Avoids adding salt to his diet. Breakfast: boiled egg, oatmeal, or fruit. Lunch: [place] sandwich, Dinner: peas, corns, chicken, beef, Snacks: crackers. Drinks water, 1/2 cup of sweet tea every few days, diet Mt. Dew. Uses sweet and low in his coffee.  Exercise: Walks ~1 mile every day.   Family History: The patient's family history includes Anxiety disorder in his brother.  Social History: Current smoker (3-4 cigarettes/day), denies illicit drug and alcohol use. Patient helps his mother and is usually unavailable on Thursdays and Fridays.   Labs: 05/17/19 - TC 502, TG 3306, HDL 15, LDL-D 54, LFTs WNL (Fenofibrate 160 mg daily, rosuvastatin 40 mg daily, fish oil 4 g daily, niaspan 1,000 mg BID-non adherent) 11/02/18 - TC 441, TG 2894, HDL 17, LDL-D 85, LFTs WNL (Fenofibrate 160 mg daily, rosuvastatin 40 mg daily, fish oil 4 g daily, niaspan 1,000 mg BID) 06/30/18 - TC 188, TG 643, HDL 30, LDL-D 65, LFTs WNL (resumed all prior meds) 03/24/2018- TC 284, TG 817, HDL 32, LDL-d 131, LFTs WNL (on rosuvastatin 40mg  and fenofibrate 145mg  daily, fish oil 4g daily, niacin  500mg  BID) 05/13/2017 - TC 293, TG 1833, HDL 28, LDL uncalc, LFTs WNL (on crestor 40mg  and fenofibrate 145mg  daily but had run out of his medication for 1 week) 03/25/2017 - TC 191, TG 927, HDL 35, LDL uncalc, LFTs wnl (on atorvastatin 80mg  daily) 02/03/2017: Hgb A1c = 5.5%  Past Medical History:  Diagnosis Date  . Aneurysm ([place])   . Anxiety   . CAD (coronary artery disease)    02/01/17 Cath DES--> OM, staged PCI DES -->mLAD, EF 55%  . COPD (chronic obstructive pulmonary disease) ([place])    smoker  .  Hypertension    high with anxiety  . Myocardial infarction [facility])     Current Outpatient Medications on File Prior to Visit  Medication Sig Dispense Refill  . acetaminophen (TYLENOL) 500 MG tablet Take 1 tablet (500 mg total) by mouth every 6 (six) hours as needed for headache (pain). 30 tablet 0  . aspirin 81 MG chewable tablet Chew 1 tablet (81 mg total) by mouth daily.    . clopidogrel (PLAVIX) 75 MG tablet Take 1 tablet (75 mg total) by mouth daily. 30 tablet 11  . fenofibrate 160 MG tablet Take 1 tablet (160 mg total) by mouth daily. 30 tablet 11  . lisinopril (PRINIVIL,ZESTRIL) 10 MG tablet Take 1 tablet (10 mg total) by mouth daily. 30 tablet 11  . metoprolol tartrate (LOPRESSOR) 25 MG tablet TAKE 1 TABLET BY MOUTH TWICE A DAY 60 tablet 1  . niacin (NIASPAN) 1000 MG CR tablet Take 1 tablet (1,000 mg total) by mouth 2 (two) times daily. 60 tablet 11  . nitroGLYCERIN (NITROSTAT) 0.4 MG SL tablet Place 1 tablet (0.4 mg total) under the tongue every 5 (five) minutes as needed for chest pain. 25 tablet 3  . Omega-3 Fatty Acids (FISH OIL) 1000 MG CAPS Take 4 capsules (4,000 mg total) by mouth daily.  0  . rosuvastatin (CRESTOR) 40 MG tablet Take 1 tablet (40 mg total) by mouth daily. 30 tablet 11   No current facility-administered medications on file prior to visit.     No Known Allergies  Assessment/Plan:  1. Hypertriglyceridemia/Hyperlipidemia - LDL level above goal <70 and has decreased from 85 to 54, likely suspect this is falsely low due to significant TG increase. TG level significantly increased from 2894 to 3306. Dyslipidemia is likely due to non-adherence to all HLD medications. Will restart fenofibrate 160 mg daily, rosuvastatin 40 mg daily, fish oil 4 g daily, and Niaspan 1,000 mg BID as prior lipid lowering therapy was at optimized doses. Discussed with pt different options to help afford medications ($4 [employer] list, Medicaid). Provided pt with Medicaid application today.  Encouraged patient to use a pillbox to stay adherent to his medications and to continue with low carb/sugar/alcohol diet. Plan to f/u with pt via telephone in about ~1 month to determine status of Medicaid application as well as adherence to lipid lowering therapy. Also, will recheck lipids in 6 months and f/u lipid clinic after to discuss results.   Thank you for involving pharmacy to assist with providing Anthony Coffey care.   Drexel Iha, PharmD PGY2 Ambulatory Care Resident

## 2019-05-17 NOTE — Telephone Encounter (Signed)

## 2019-05-17 NOTE — Patient Instructions (Addendum)
It was nice to see you today  Restart your rosuvastatin, fenofibrate, niacin, and fish oil - these are very important medications to help lower your cholesterol, prevent heart attacks, and pancreatitis  Decrease your intake of white carbohydrates and sugar in your diet  Try to walk 30 minutes 5 days a week  Apply for [employer] using the paperwork we have given you  Department of Social Services: [address] [place], [place] [address] Phone: [phone]  Follow up for fasting lab work on Monday January 18th - come in any time after 7:30am  Follow up in clinic to discuss your results on Tuesday January 19th at 10am

## 2019-05-18 ENCOUNTER — Ambulatory Visit (INDEPENDENT_AMBULATORY_CARE_PROVIDER_SITE_OTHER): Payer: Self-pay | Admitting: Pharmacist

## 2019-05-18 DIAGNOSIS — E785 Hyperlipidemia, unspecified: Secondary | ICD-10-CM

## 2019-05-18 DIAGNOSIS — E781 Pure hyperglyceridemia: Secondary | ICD-10-CM

## 2019-05-18 MED ORDER — ROSUVASTATIN CALCIUM 40 MG PO TABS: 40.0000 mg | ORAL_TABLET | Freq: Every day | ORAL | 11 refills | Status: DC

## 2019-05-18 MED ORDER — NIACIN ER (ANTIHYPERLIPIDEMIC) 1000 MG PO TBCR: 1000.0000 mg | EXTENDED_RELEASE_TABLET | Freq: Two times a day (BID) | ORAL | 11 refills | Status: DC

## 2019-05-18 MED ORDER — FENOFIBRATE 160 MG PO TABS: 160.0000 mg | ORAL_TABLET | Freq: Every day | ORAL | 11 refills | Status: DC

## 2019-05-21 LAB — HEPATIC FUNCTION PANEL
ALT: 6 IU/L (ref 0–44)
AST: 18 IU/L (ref 0–40)
Albumin: 4.3 g/dL (ref 3.8–4.8)
Alkaline Phosphatase: 73 IU/L (ref 39–117)
Bilirubin Total: <0.2 mg/dL (ref 0.0–1.2)
Bilirubin, Direct: 0.2 mg/dL (ref 0.00–0.40)
Total Protein: 6.6 g/dL (ref 6.0–8.5)

## 2019-05-21 LAB — LIPID PANEL
Chol/HDL Ratio: 33.5 ratio — ABNORMAL HIGH (ref 0.0–5.0)
Cholesterol, Total: 502 mg/dL — ABNORMAL HIGH (ref 100–199)
HDL: 15 mg/dL — ABNORMAL LOW (ref >39)
Triglycerides: 3306 mg/dL (ref 0–149)

## 2019-05-21 LAB — LDL CHOLESTEROL, DIRECT: LDL Direct: 54 mg/dL (ref 0–99)

## 2019-06-15 ENCOUNTER — Telehealth: Payer: Self-pay | Admitting: Pharmacist

## 2019-06-15 NOTE — Telephone Encounter (Signed)
Called pt to follow up about accessibility to HLD meds (fenofibrate 160 mg daily, rosuvastatin 40 mg daily, fish oil 4 g daily, and Niaspan 1,000 mg BID) and Medicaid application. Pt was unable to afford meds in the past due to lack of insurance. Pt states he will call back to confirm medications. 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  Pt never called back.

## 2019-06-16 NOTE — Telephone Encounter (Signed)
Called pt again on (06/16/19) to follow up about accessibility to HLD meds (fenofibrate 160 mg daily, rosuvastatin 40 mg daily, fish oil 4 g daily, and Niaspan 1,000 mg BID). (Unsuccessful attempt #2)

## 2019-06-23 NOTE — Telephone Encounter (Signed)
Called pt on 06/23/19 at 9:25AM. Pt answered and said he was busy taking care of his mother who was having a gout attack and was also preparing breakfast. Pt stated he has the clinic number and will call back at a more convenient time. Plan to wait for pt's call.

## 2019-07-11 ENCOUNTER — Other Ambulatory Visit: Payer: Self-pay | Admitting: Interventional Cardiology

## 2019-07-11 DIAGNOSIS — I10 Essential (primary) hypertension: Secondary | ICD-10-CM

## 2019-07-16 ENCOUNTER — Other Ambulatory Visit: Payer: Self-pay | Admitting: Interventional Cardiology

## 2019-07-16 DIAGNOSIS — I2102 ST elevation (STEMI) myocardial infarction involving left anterior descending coronary artery: Secondary | ICD-10-CM

## 2019-08-21 ENCOUNTER — Other Ambulatory Visit: Payer: Self-pay | Admitting: Interventional Cardiology

## 2019-08-21 DIAGNOSIS — I2102 ST elevation (STEMI) myocardial infarction involving left anterior descending coronary artery: Secondary | ICD-10-CM

## 2019-08-21 DIAGNOSIS — I10 Essential (primary) hypertension: Secondary | ICD-10-CM

## 2019-09-26 NOTE — Progress Notes (Signed)
Cardiology Office Note:    Date:  09/28/2019   ID:  Anthony Coffey, DOB Nov 23, 1955, MRN [id number]  PCP:  Pt, No Pcp Per (Inactive)  Cardiologist:  Sinclair Grooms, MD  Electrophysiologist:  None   Referring MD: No ref. provider found   Chief Complaint: follow-up of CAD  History of Present Illness:    Anthony Coffey is a 63 y.o. male with a history of CAD with STEMI in 01/2017 s/p DES to OM with subsequent staged PCI/DES to mid LAD, abdominal aortic aneurysm repair following acute rupture in 2010,  hypertension,hyperlipidema, COPD, anxiety, and tobacco use who is followed by Dr. Tamala Julian and presents today for routine follow-up of CAD.  Patient first seen by Cardiology after presenting with STEMI in 01/2017. Patient underwent primary PCI with DES to OM2 and subsequent staged PCI with DES to mid LAD two days later. Echo showed LVEF of 50-55% with normal wall motion and grade 1 diastolic dysfunction. Patient initially treated with Aspirin and Brilinta, but Brilinta was too expensive and was changed to Plavix.  Patient was last seen by Dr. Tamala Julian in 02/2017 at which time her reported doing well following MI. Patient was instructed to follow-up in 6 months but it does not look like this ever happened. However, he has been followed in our lipid clinic since that time for hyperlipidemia with severely elevated triglycerides. Most recent lipid panel from 04/2019 showed Total Cholesterol of 502, Triglycerides 3,306, HDL 15, LDL 54. He was seen in the lipid clinic around the time of these labs and reported being non-adherent to his cholesterol mediations due to cost. Patient reported not taking his medications for several months. He also reported continuing to eat a diet high in sugar and carbs. He was restarted on Crestor 40mg  daily, Fenobriate 160mg  daily, Fish Oil 4g daily, and Niaspan 1,000mg  twice daily. Patient was provided with Medicaid application and different options to help afford medications were  discussed. Patient was also encouraged to use a pillbox to help stay adherent to his medications and to follow a low carb/sugar/alochol diet. Patient instructed to follow-up in 6 months.   Patient presents today for follow-up of CAD. Patient doing well from a cardiac standpoint. He denies any chest pain, shortness of breath, palpitations, lightheadedness, dizziness, near syncope, syncope, orthopnea, PND, or edema. No abnormal bleeding on dual antiplatelet therapy. He states he is trying to watch what he eats and is trying to stay active. He has been raking a lot of leaves recently and denies any exertional symptoms with this. He continues to smoke about 1/2 pack per day but this is down from his previous 2 packs per day. Patient denies any alcohol use. Patient states he has had a lot of anxiety this year and is wondering if we can prescribe medicine for this.   He has been out of his home Lisinopril and Lopressor for 1 week and BP is mildly elevated at 144/100 today. He reports compliance with dual antiplatelet therapy and all cholesterol medications.  Past Medical History:  Diagnosis Date  . Aneurysm ([place])   . Anxiety   . CAD (coronary artery disease)    02/01/17 Cath DES--> OM, staged PCI DES -->mLAD, EF 55%  . COPD (chronic obstructive pulmonary disease) ([place])    smoker  . Hypertension    high with anxiety  . Myocardial infarction [facility])     Past Surgical History:  Procedure Laterality Date  . ABDOMINAL AORTIC ANEURYSM REPAIR    .  CORONARY STENT INTERVENTION N/A 02/01/2017   Procedure: Coronary Stent Intervention;  Surgeon: Lyn Records, MD;  Location: [facility] INVASIVE CV LAB;  Service: Cardiovascular;  Laterality: N/A;  . CORONARY STENT INTERVENTION N/A 02/03/2017   Procedure: Coronary Stent Intervention;  Surgeon: Tonny Bollman, MD;  Location: [facility] INVASIVE CV LAB;  Service: Cardiovascular;  Laterality: N/A;  . EAR CYST EXCISION Right 06/14/2013   Procedure: CYST REMOVAL RIGHT MANDIBLE;  Surgeon:  Georgia Lopes, DDS;  Location: [facility] OR;  Service: Oral Surgery;  Laterality: Right;  . LEFT HEART CATH AND CORONARY ANGIOGRAPHY N/A 02/01/2017   Procedure: Left Heart Cath and Coronary Angiography;  Surgeon: Lyn Records, MD;  Location: [facility] INVASIVE CV LAB;  Service: Cardiovascular;  Laterality: N/A;  . TONSILLECTOMY    . TOOTH EXTRACTION N/A 06/14/2013   Procedure: EXTRACTIONS 17, 23, 26, 27, 29, 32;  Surgeon: Georgia Lopes, DDS;  Location: [facility] OR;  Service: Oral Surgery;  Laterality: N/A;  . VASCULAR SURGERY      Current Medications: Current Meds  Medication Sig  . acetaminophen (TYLENOL) 500 MG tablet Take 1 tablet (500 mg total) by mouth every 6 (six) hours as needed for headache (pain).  Marland Kitchen aspirin 81 MG chewable tablet Chew 1 tablet (81 mg total) by mouth daily.  . clopidogrel (PLAVIX) 75 MG tablet Take 1 tablet (75 mg total) by mouth daily.  . fenofibrate 160 MG tablet Take 1 tablet (160 mg total) by mouth daily.  Marland Kitchen lisinopril (ZESTRIL) 10 MG tablet Take 1 tablet (10 mg total) by mouth daily.  . metoprolol tartrate (LOPRESSOR) 25 MG tablet Take 1 tablet (25 mg total) by mouth 2 (two) times daily.  . niacin (NIASPAN) 1000 MG CR tablet Take 1 tablet (1,000 mg total) by mouth 2 (two) times daily.  . nitroGLYCERIN (NITROSTAT) 0.4 MG SL tablet Place 1 tablet (0.4 mg total) under the tongue every 5 (five) minutes as needed for chest pain.  . Omega-3 Fatty Acids (FISH OIL) 1000 MG CAPS Take 4 capsules (4,000 mg total) by mouth daily.  . rosuvastatin (CRESTOR) 40 MG tablet Take 1 tablet (40 mg total) by mouth daily.  . [DISCONTINUED] lisinopril (ZESTRIL) 10 MG tablet Take 1 tablet (10 mg total) by mouth daily. Pt must schedule appt with Dr. Katrinka Blazing for further refills - 3rd attempt  . [DISCONTINUED] metoprolol tartrate (LOPRESSOR) 25 MG tablet Take 1 tablet (25 mg total) by mouth 2 (two) times daily. Pt must schedule appt with Dr. Katrinka Blazing for further refills - 3rd attempt     Allergies:   Patient has  no known allergies.   Social History   Socioeconomic History  . Marital status: Single    Spouse name: Not on file  . Number of children: Not on file  . Years of education: Not on file  . Highest education level: Not on file  Occupational History  . Not on file  Social Needs  . Financial resource strain: Not on file  . Food insecurity    Worry: Not on file    Inability: Not on file  . Transportation needs    Medical: Not on file    Non-medical: Not on file  Tobacco Use  . Smoking status: Current Every Day Smoker    Packs/day: 1.00    Years: 39.00    Pack years: 39.00    Types: Cigarettes  . Smokeless tobacco: Former Engineer, water and Sexual Activity  . Alcohol use: No  . Drug use: No  .  Sexual activity: Never  Lifestyle  . Physical activity    Days per week: Not on file    Minutes per session: Not on file  . Stress: Not on file  Relationships  . Social Musicianconnections    Talks on phone: Not on file    Gets together: Not on file    Attends religious service: Not on file    Active member of club or organization: Not on file    Attends meetings of clubs or organizations: Not on file    Relationship status: Not on file  Other Topics Concern  . Not on file  Social History Narrative   ** Merged History Encounter **         Family History: The patient's family history includes Anxiety disorder in his brother.  ROS:   Please see the history of present illness.    All other systems reviewed and are negative.  EKGs/Labs/Other Studies Reviewed:    The following studies were reviewed today:  Left Heart Catheterization 02/01/2017:  Acute inferolateral infarction due to occlusion of the second obtuse marginal which is a large vessel that runs along the inferolateral wall.  100% second obtuse marginal reduced to 0% with TIMI grade 3 flow after angioplasty and stenting using a 2.5 x 16 mm Synergy DES.  90% mid LAD stenosis  Widely patent left main and small  nondominant RCA.  Normal LV function with EDP 23 mmHg. LVEF 55%  Recommendations:  High intensity statin therapy  Dual antiplatelet therapy with aspirin and Brilinta  IV hydration as tolerated  Chest x-ray should be performed in this smoker who has not had consistent healthcare for greater than 10 years.  Staged LAD intervention on Monday. Will need orders written Sunday evening assuming no complications _______________  Coronary Stent Intervention 02/03/2017: SUCCESSFUL STAGED PCI OF SEVERE MID-LAD STENOSIS USING A 2.5X12 MM PROMUS DES  Recommendations:  DAPT with ASA and brilinta minimum of 12 months (STEMI at initial presentation)  Hospital discharge tomorrow if no complications arise _______________  Echocardiogram 02/03/2017: Study Conclusions: - Left ventricle: The cavity size was normal. There was mild   concentric hypertrophy. Systolic function was normal. The   estimated ejection fraction was in the range of 50% to 55%. Wall   motion was normal; there were no regional wall motion   abnormalities. Doppler parameters are consistent with abnormal   left ventricular relaxation (grade 1 diastolic dysfunction).   There was no evidence of elevated ventricular filling pressure by   Doppler parameters. - Aortic valve: There was no regurgitation. - Aortic root: The aortic root was normal in size. - Mitral valve: Structurally normal valve. There was mild   regurgitation. - Right ventricle: Systolic function was normal. - Tricuspid valve: There was trivial regurgitation. - Pulmonary arteries: Systolic pressure was within the normal   range. - Inferior vena cava: The vessel was normal in size. - Pericardium, extracardiac: There was no pericardial effusion.  EKG:  EKG ordered today. EKG personally reviewed and demonstrates normal sinus rhythm, rate 81 bpm, with incomplete RBBB, T wave inversions in aVR, and non-specific T wave flattening in leads III and V1. T wave inversions  in inferior leads and leads V4-V6 from tracing in 01/2017 have resolved.  Recent Labs: 05/17/2019: ALT 6  Recent Lipid Panel    Component Value Date/Time   CHOL 502 (H) 05/17/2019 0757   TRIG 3,306 (HH) 05/17/2019 0757   HDL 15 (L) 05/17/2019 0757   CHOLHDL 33.5 (H) 05/17/2019 0757  CHOLHDL 11.9 02/01/2017 1825   VLDL UNABLE TO CALCULATE IF TRIGLYCERIDE OVER 400 mg/dL 40/98/1191 4782   LDLCALC Comment 05/17/2019 0757   LDLDIRECT 54 05/17/2019 0757    Physical Exam:    Vital Signs: BP (!) 144/100 (BP Location: Left Arm, Patient Position: Sitting, Cuff Size: Normal)   Pulse 81   Ht  (1.753 m)   Wt 186 lb 6.4 oz (84.6 kg)   BMI 27.53 kg/m     Wt Readings from Last 3 Encounters:  09/28/19 186 lb 6.4 oz (84.6 kg)  04/16/17 185 lb (83.9 kg)  03/25/17 180 lb 6.4 oz (81.8 kg)     General: 63 y.o. male in no acute distress. HEENT: Normocephalic and atraumatic. Sclera clear. EOMs intact. Neck: Supple. No carotid bruits. No JVD. Heart: RRR. Distinct S1 and S2. No murmurs, gallops, or rubs. Radial and posterior tibial pulses 2+ and equal bilaterally. Lungs: No increased work of breathing. Clear to ausculation bilaterally. No wheezes, rhonchi, or rales.  Abdomen: Soft, non-distended, and non-tender to palpation. Bowel sounds present. MSK: Normal strength and tone for age. Extremities: No clubbing, cyanosis, or edema.    Skin: Warm and dry. Neuro: Alert and oriented x3. No focal deficits. Psych: Normal affect. Responds appropriately.  Assessment:    1. Coronary artery disease involving native coronary artery of native heart without angina pectoris   2. History of ST elevation myocardial infarction (STEMI)   3. Hypertension, unspecified type   4. Hyperlipidemia, unspecified hyperlipidemia type   5. Tobacco use   6. Anxiety     Plan:    CAD without Angina - History of STEMI in 01/2017 s/p PCI/DES to OM2 and mid LAD. - Stable. No angina.  - Currently on dual antiplatelet  therapy with Aspirin  daily and Plavix  daily. Has been over 2 years since PCI so may be able to stop Plavix. Will confirm with Dr. Katrinka Blazing. - Continue beta-blocker and statin. - Patient has not had any routine labs drawn in over 2 years (other than lipid panel and LFTs) so will check CBC and BMET today.  Hypertension - BP 144/100 but patient has been out of antihypertensives for 1 weeks. He states he checks his BP at home and it is normally 130-135/70. - Continue Lisinopril  daily and Lopressor  twice daily. Will refill both of these today. - Advised patient to continue to monitor BP at home and let us know if consistently elevated. Discussed importance of keeping BP well controlled with history of abdominal aortic aneurysm with rupture.  Hyperlipidemia - Most recent lipid panel from 04/2019: Total Cholesterol of 502, Triglycerides 3,306, HDL 15, LDL 54. - Continue Crestor  daily, Fenobriate  daily, Fish Oil 4g daily, and Niaspan 1,000mg  twice daily. - Patient reports compliance with above medications. He denies any alcohol use. - Followed by lipid clinic. Next appointment schedule for 11/16/2019.   Tobacco Use - Patient continues to smoke 1/2 pack per day but this is down from 2 packs per day. Congratulated patient on progress he has made so far and encouraged him to continue to work towards complete cessation.  Anxiety - Patient has a history of anxiety but states he has never been on any medications. He states his anxiety has been worse this year with the pandemic and was wondering if we could prescribe him medications for this. Informed him that our office does not usually prescribe anti-anxiety medications. Patient previous seen at [facility] and [facility] but reportedly stopped going  and does not currently have a PCP. Recommended he get re-established there.  Disposition: Follow up in 6 months with Dr. Katrinka Blazing.   Medication Adjustments/Labs and Tests  Ordered: Current medicines are reviewed at length with the patient today.  Concerns regarding medicines are outlined above.  Orders Placed This Encounter  Procedures  . Basic Metabolic Panel (BMET)  . CBC w/Diff  . EKG 12-Lead   Meds ordered this encounter  Medications  . metoprolol tartrate (LOPRESSOR) 25 MG tablet    Sig: Take 1 tablet (25 mg total) by mouth 2 (two) times daily.    Dispense:  30 tablet    Refill:  11    DX Code Needed  .  . lisinopril (ZESTRIL) 10 MG tablet    Sig: Take 1 tablet (10 mg total) by mouth daily.    Dispense:  30 tablet    Refill:  11    DX Code Needed  .    Patient Instructions  Medication Instructions:  Your physician recommends that you continue on your current medications as directed. Please refer to the Current Medication list given to you today. 1.  Medications were refilled today at requested pharmacy.   *If you need a refill on your cardiac medications before your next appointment, please call your pharmacy*  Lab Work: Your physician recommends that you have lab work today: bmet/cbc.  If you have labs (blood work) drawn today and your tests are completely normal, you will receive your results only by: Marland Kitchen MyChart Message (if you have MyChart) OR . A paper copy in the mail If you have any lab test that is abnormal or we need to change your treatment, we will call you to review the results.  Testing/Procedures: -None  Follow-Up: At [facility], you and your health needs are our priority.  As part of our continuing mission to provide you with exceptional heart care, we have created designated Provider Care Teams.  These Care Teams include your primary Cardiologist (physician) and Advanced Practice Providers (APPs -  Physician Assistants and Nurse Practitioners) who all work together to provide you with the care you need, when you need it.  Your next appointment:   6 month(s)  The format for your next appointment:   Either In Person  or Virtual  Provider:   Verdis Prime, MD  Other Instructions Get back in with the [facility] and [facility]     Signed, Smitty Knudsen  09/28/2019 8:39 AM    [facility]

## 2019-09-28 ENCOUNTER — Other Ambulatory Visit: Payer: Self-pay

## 2019-09-28 ENCOUNTER — Ambulatory Visit (INDEPENDENT_AMBULATORY_CARE_PROVIDER_SITE_OTHER): Payer: Self-pay | Admitting: Student

## 2019-09-28 ENCOUNTER — Encounter: Payer: Self-pay | Admitting: Student

## 2019-09-28 VITALS — BP 144/100 | HR 81 | Ht 69.0 in | Wt 186.4 lb

## 2019-09-28 DIAGNOSIS — I252 Old myocardial infarction: Secondary | ICD-10-CM

## 2019-09-28 DIAGNOSIS — Z720 Tobacco use: Secondary | ICD-10-CM

## 2019-09-28 DIAGNOSIS — I2510 Atherosclerotic heart disease of native coronary artery without angina pectoris: Secondary | ICD-10-CM

## 2019-09-28 DIAGNOSIS — E785 Hyperlipidemia, unspecified: Secondary | ICD-10-CM

## 2019-09-28 DIAGNOSIS — I10 Essential (primary) hypertension: Secondary | ICD-10-CM

## 2019-09-28 DIAGNOSIS — F419 Anxiety disorder, unspecified: Secondary | ICD-10-CM

## 2019-09-28 LAB — CBC WITH DIFFERENTIAL/PLATELET
Basophils Absolute: 0.1 10*3/uL (ref 0.0–0.2)
Basos: 1 %
EOS (ABSOLUTE): 0.3 10*3/uL (ref 0.0–0.4)
Eos: 3 %
Hematocrit: 46.9 % (ref 37.5–51.0)
Hemoglobin: 16.4 g/dL (ref 13.0–17.7)
Immature Grans (Abs): 0 10*3/uL (ref 0.0–0.1)
Immature Granulocytes: 0 %
Lymphocytes Absolute: 1.9 10*3/uL (ref 0.7–3.1)
Lymphs: 19 %
MCH: 30.3 pg (ref 26.6–33.0)
MCHC: 35 g/dL (ref 31.5–35.7)
MCV: 87 fL (ref 79–97)
Monocytes Absolute: 0.5 10*3/uL (ref 0.1–0.9)
Monocytes: 5 %
Neutrophils Absolute: 7.2 10*3/uL — ABNORMAL HIGH (ref 1.4–7.0)
Neutrophils: 72 %
Platelets: 307 10*3/uL (ref 150–450)
RBC: 5.41 x10E6/uL (ref 4.14–5.80)
RDW: 12.8 % (ref 11.6–15.4)
WBC: 10 10*3/uL (ref 3.4–10.8)

## 2019-09-28 LAB — BASIC METABOLIC PANEL
BUN/Creatinine Ratio: 10 (ref 10–24)
BUN: 10 mg/dL (ref 8–27)
CO2: 21 mmol/L (ref 20–29)
Calcium: 9.8 mg/dL (ref 8.6–10.2)
Chloride: 102 mmol/L (ref 96–106)
Creatinine, Ser: 0.96 mg/dL (ref 0.76–1.27)
GFR calc Af Amer: 98 mL/min/{1.73_m2} (ref >59)
GFR calc non Af Amer: 85 mL/min/{1.73_m2} (ref >59)
Glucose: 99 mg/dL (ref 65–99)
Potassium: 4.4 mmol/L (ref 3.5–5.2)
Sodium: 136 mmol/L (ref 134–144)

## 2019-09-28 MED ORDER — LISINOPRIL 10 MG PO TABS: 10.0000 mg | ORAL_TABLET | Freq: Every day | ORAL | 11 refills | Status: DC

## 2019-09-28 MED ORDER — METOPROLOL TARTRATE 25 MG PO TABS: 25.0000 mg | ORAL_TABLET | Freq: Two times a day (BID) | ORAL | 11 refills | Status: DC

## 2019-09-28 NOTE — Patient Instructions (Signed)
Medication Instructions:  Your physician recommends that you continue on your current medications as directed. Please refer to the Current Medication list given to you today. 1.  Medications were refilled today at requested pharmacy.   *If you need a refill on your cardiac medications before your next appointment, please call your pharmacy*  Lab Work: Your physician recommends that you have lab work today: bmet/cbc.  If you have labs (blood work) drawn today and your tests are completely normal, you will receive your results only by: Marland Kitchen MyChart Message (if you have MyChart) OR . A paper copy in the mail If you have any lab test that is abnormal or we need to change your treatment, we will call you to review the results.  Testing/Procedures: -None  Follow-Up: At [facility], you and your health needs are our priority.  As part of our continuing mission to provide you with exceptional heart care, we have created designated Provider Care Teams.  These Care Teams include your primary Cardiologist (physician) and Advanced Practice Providers (APPs -  Physician Assistants and Nurse Practitioners) who all work together to provide you with the care you need, when you need it.  Your next appointment:   6 month(s)  The format for your next appointment:   Either In Person or Virtual  Provider:   Daneen Schick, MD  Other Instructions Get back in with the [facility] and [facility]

## 2019-09-29 ENCOUNTER — Telehealth: Payer: Self-pay

## 2019-09-29 NOTE — Telephone Encounter (Signed)
-----   Message from Darreld Mclean, [place] sent at 09/29/2019  7:40 AM EST ----- Please notify patient of results:  - Labs look good. - Also, I discussed with Dr. Tamala Julian - patient should stop Aspirin but continue Plavix.  - Continue with rest of plan discussed at visit yesterday.  Thank you!

## 2019-09-29 NOTE — Telephone Encounter (Signed)
Notes recorded by Frederik Schmidt, RN on 09/29/2019 at 8:06 AM EST  The patient has been notified of the result and verbalized understanding. All questions (if any) were answered.  Frederik Schmidt, RN 09/29/2019 8:06 AM

## 2019-11-11 NOTE — Progress Notes (Addendum)
Patient ID: Anthony Coffey                 DOB: 10-14-1956                    MRN: [id number]     HPI: Anthony Coffey is a 64 y.o. male patient referred to lipid clinic by Dr. Tamala Julian. PMH is significant for CAD (STEMI 01/2017 w/ OM DES and mid LAD DES), HTN, COPD, and anxiety. Patient was last seen in lipid clinic in January 2020 and has been following with [place] since 2018.   At prior appt with lipid clinic (05/18/19) patient had issues with compliance. Pharmacy was called to determine last fill dates, which are listed as followed: fenofibrate (07/07/2018; 30 days), rosuvastatin (08/05/2018; 30 days), niacin (never filled/on hold). Since omega-3 fatty acids are OTC it was not possible to determine LF date through the pharmacy. When asked about LF dates, pt admitted to non-adherence to HLD medications d/t not being able to afford medications. He was denied from the disability program application. He has noted in the past he generally takes his medications in the morning, which are located above his refrigerator. He has stated previously that he has challenges with administering/tolerating fenofibrate due to large pill size and GI upset. Patient was lost to follow up after never returning call back after 3 call attempts.  Dr. Fuller Canada called pharmacy to determine last fills for HLD medications which are listed as follows: rosuvastatin (LF 07/2018), niacin (never filled), and fenofibrate (08/2019 -  Refill waiting in bin). Unknown how adherent patient is with fish oil medication considering it is OTC.   Patient presents today for follow up appt with lipid clinic. He states he has been taking "a big white pill" once daily for his cholesterol. He was not familiar with any other HLD medications (asked specifically about rosuvastatin and niaspan). He states he has been out of "big white pill" for about one month. He states he has applied to Medicaid, but has not heard a response. He confirms he is currently self-pay  for medications. Patient admits to frequent non-adherence to medications. Patient is currently smoking 10 cigarettes daily. He states he is open to quitting if the cost is covered. He has tried Chantix before and had a bad experience (made him sleep walk, bad nightmares) - he thinks this is when he was taking 1 tablet daily.  Pt also mentions that he lives with his mother and is her caregiver. He has one brother who lives in [place], [place] (~30 min away).   Current Medications: Fenofibrate 160 mg daily - ran out, rosuvastatin 40 mg daily - not taking, fish oil 4 g daily, niaspan 1,000 mg BID - not taking Risk Factors: CAD, STEMI, HTN, male LDL goal: <70 mg/dL TG goal: <150 mg/dL  Diet: Avoids adding salt to his diet. Breakfast: boiled egg, oatmeal, or fruit. Lunch: [place] sandwich, Dinner: peas, corns, chicken, beef, Snacks: crackers, ice cream. Drinkswater, 1/2 cup of sweet tea every few days, diet Mt. Dew.Uses sweet and low in his coffee  Exercise: Decreased from ~1 mile every day to walking for 15 minutes about 3x per week  Family History: The patient's family history includes Anxiety disorder in his brother.  Social History: Current smoker (3-4 cigarettes/day), denies illicit drug and alcohol use. Patient helps his mother and is usually unavailable on Thursdays and Fridays.   Labs: 11/15/19 TC 313 TG 1228 HDL 25 LDL 114, LFTs WNL (Fenofibrate  160 mg daily - non adherence) 05/17/19 - TC 502, TG 3306, HDL 15, LDL-D 54, LFTs WNL (Fenofibrate 160 mg daily, rosuvastatin 40 mg daily, fish oil 4 g daily, niaspan 1,000 mg BID-non adherent) 11/02/18 - TC 441, TG 2894, HDL 17, LDL-D 85, LFTs WNL (Fenofibrate 160 mg daily, rosuvastatin 40 mg daily, fish oil 4 g daily, niaspan 1,000 mg BID) 06/30/18 - TC 188, TG 643, HDL 30, LDL-D 65, LFTs WNL (resumed all prior meds) 03/24/2018- TC 284, TG 817, HDL 32, LDL-d 131, LFTs WNL (on rosuvastatin 40mg  and fenofibrate 145mg  daily, fish oil 4g daily, niacin  500mg  BID) 05/13/2017 - TC 293, TG 1833, HDL 28, LDL uncalc, LFTs WNL (on crestor 40mg  and fenofibrate 145mg  daily but had run out of his medication for 1 week) 03/25/2017 - TC 191, TG 927, HDL 35, LDL uncalc, LFTs wnl (on atorvastatin 80mg  daily) 02/03/2017: Hgb A1c = 5.5%  Past Medical History:  Diagnosis Date  . Aneurysm (HCC)   . Anxiety   . CAD (coronary artery disease)    02/01/17 Cath DES--> OM, staged PCI DES -->mLAD, EF 55%  . COPD (chronic obstructive pulmonary disease) (HCC)    smoker  . Hypertension    high with anxiety  . Myocardial infarction [facility])     Current Outpatient Medications on File Prior to Visit  Medication Sig Dispense Refill  . acetaminophen (TYLENOL) 500 MG tablet Take 1 tablet (500 mg total) by mouth every 6 (six) hours as needed for headache (pain). 30 tablet 0  . clopidogrel (PLAVIX) 75 MG tablet Take 1 tablet (75 mg total) by mouth daily. 30 tablet 11  . fenofibrate 160 MG tablet Take 1 tablet (160 mg total) by mouth daily. 30 tablet 11  . lisinopril (ZESTRIL) 10 MG tablet Take 1 tablet (10 mg total) by mouth daily. 30 tablet 11  . metoprolol tartrate (LOPRESSOR) 25 MG tablet Take 1 tablet (25 mg total) by mouth 2 (two) times daily. 30 tablet 11  . niacin (NIASPAN) 1000 MG CR tablet Take 1 tablet (1,000 mg total) by mouth 2 (two) times daily. 60 tablet 11  . nitroGLYCERIN (NITROSTAT) 0.4 MG SL tablet Place 1 tablet (0.4 mg total) under the tongue every 5 (five) minutes as needed for chest pain. 25 tablet 3  . Omega-3 Fatty Acids (FISH OIL) 1000 MG CAPS Take 4 capsules (4,000 mg total) by mouth daily.  0  . rosuvastatin (CRESTOR) 40 MG tablet Take 1 tablet (40 mg total) by mouth daily. 30 tablet 11   No current facility-administered medications on file prior to visit.    No Known Allergies  Assessment/Plan:  1. Hyperlipidemia - LDL goal < 70 mg/dL and TG goal mg/dL; therefore, patient is not at goal. Stressed to patient the importance of compliance  with his medications. Discussed risk of ASCVD event due to elevated LDL and risk of pancreatitis since TG >1000. Once patient was shown lipid panel numbers he was able to verbalize understanding. Patient has concerns with costs of medications since he is self-pay and [facility]. Thoroughly discussed medication options based on cost and location of pharmacy. Also, discussed enrolling in the ORION study to initiate inclisiran (twice yearly dosing - will help with adherence - patient politely declined and would strongly prefer to stay on PO medications). Plan to discontinue rosuvastatin and initiate atorvastatin 80 mg daily based on cost. Printed out GoodRx coupons for atorvastatin and fenofibrate. Sent in new rx  with refills for both prescriptions. Patient confirms he has OTC fish oil and has been taking 2 capsules daily. Patient is not able to afford rx fish oil, but states he has an adequate supply of OTC fish oil at home. Plan to increase OTC fish oil to 2 capsules TWICE daily - patient verbalized understanding. Patient is not able to afford niaspan therefore is not taking - will hold off on restarting for now until patient is able to afford medication. [facility] to determine if patient was able to pick up HLD medications. If patient successfully picked up HLD medications then will plan to schedule lipid panel lab draw in 6 weeks. Discussed with patient the the importance of re-considering enrolling in ORION trial if LDL and TG remain elevated.  2. Smoking Cessation - Patient is currently smoking 10 cigarettes daily. He is open to trying to quit for his health and so he is able to continue being his mother's caregiver. Referred patient to [place] to discuss smoking cessation and for assistance with cost of smoking cessation medications. Provided patient with handouts regarding [place], smoking cessation aids, and  activities. Follow up with patient on Friday (1/22) about smoking cessation status as well.   Thank you for involving pharmacy to assist in providing Mr. Krantz care.   Zachery Conch, PharmD PGY2 Ambulatory Care Pharmacy Resident

## 2019-11-15 ENCOUNTER — Other Ambulatory Visit: Payer: Self-pay

## 2019-11-15 ENCOUNTER — Other Ambulatory Visit: Payer: Self-pay | Admitting: *Deleted

## 2019-11-15 DIAGNOSIS — E785 Hyperlipidemia, unspecified: Secondary | ICD-10-CM

## 2019-11-15 LAB — LIPID PANEL
Chol/HDL Ratio: 12.5 ratio — ABNORMAL HIGH (ref 0.0–5.0)
Cholesterol, Total: 313 mg/dL — ABNORMAL HIGH (ref 100–199)
HDL: 25 mg/dL — ABNORMAL LOW (ref >39)
Triglycerides: 1228 mg/dL (ref 0–149)

## 2019-11-15 LAB — LDL CHOLESTEROL, DIRECT: LDL Direct: 114 mg/dL — ABNORMAL HIGH (ref 0–99)

## 2019-11-15 LAB — HEPATIC FUNCTION PANEL
ALT: 21 IU/L (ref 0–44)
AST: 19 IU/L (ref 0–40)
Albumin: 4.6 g/dL (ref 3.8–4.8)
Alkaline Phosphatase: 76 IU/L (ref 39–117)
Bilirubin Total: 0.3 mg/dL (ref 0.0–1.2)
Bilirubin, Direct: 0.11 mg/dL (ref 0.00–0.40)
Total Protein: 6.9 g/dL (ref 6.0–8.5)

## 2019-11-16 ENCOUNTER — Ambulatory Visit (INDEPENDENT_AMBULATORY_CARE_PROVIDER_SITE_OTHER): Payer: Self-pay | Admitting: Pharmacist

## 2019-11-16 DIAGNOSIS — Z720 Tobacco use: Secondary | ICD-10-CM

## 2019-11-16 DIAGNOSIS — E785 Hyperlipidemia, unspecified: Secondary | ICD-10-CM

## 2019-11-16 DIAGNOSIS — E781 Pure hyperglyceridemia: Secondary | ICD-10-CM

## 2019-11-16 DIAGNOSIS — Z79899 Other long term (current) drug therapy: Secondary | ICD-10-CM

## 2019-11-16 MED ORDER — FENOFIBRATE 160 MG PO TABS: 160.0000 mg | ORAL_TABLET | Freq: Every day | ORAL | 11 refills | Status: DC

## 2019-11-16 MED ORDER — ATORVASTATIN CALCIUM 80 MG PO TABS: 80.0000 mg | ORAL_TABLET | Freq: Every day | ORAL | 11 refills | Status: DC

## 2019-11-16 NOTE — Patient Instructions (Addendum)
It was a pleasure to see you today!  Today the plan is... 1) Start taking fenofibrate 160 mg once daily 2) Start taking atorvastatin 80 mg once daily. 3) Increase fish oil from 2 capsules once daily to 2 capsules TWICE daily 4) Call 1-800-QUITNOW to stop smoking

## 2019-11-16 NOTE — Progress Notes (Deleted)
HPI:  Anthony Coffey is a 64 y.o. male patient referred to lipid clinic by Dr. Katrinka Blazing. PMH is significant for CAD (STEMI 01/2017 w/ OM DES and mid LAD DES), HTN, COPD, and anxiety. Patient was last seen in lipid clinic in January 2020 and has been following with [place] since 2018.  At prior appt with lipid clinic (05/18/19) patient had issues with compliance. Pharmacy was called to determine LF dates, which are listed as followed: fenofibrate (07/07/2018; 30 days), rosuvastatin (08/05/2018; 30 days), niacin (never filled/on hold). Since omega-3 fatty acids are OTC it was not possible to determine LF date through the pharmacy. When asked about LF dates, pt admitted to non-adherence to HLD medications d/t not being able to afford medications. He was denied from the disability program application. He has noted in the past he generally takes his medications in the morning, which are located above his refrigerator. He has stated previously that he has challenges with administering/tolerating fenofibrate due to large pill size and GI upset. Patient was lost to follow up after never returning call back after 3 call attempts.  Patient presents today for follow up appt with lipid clinic.  Ran out of meds about 1 month  1 every day - big white pill  walkes 3x per week - takes about 15 minutes  Pt also mentions that he lives with his mother and is her caregiver. He has one brother who lives in [place], [place] (~30 min away).  incliseran  Current Medications: Fenofibrate 160 mg daily, rosuvastatin 40 mg daily, fish oil 4 g daily, niaspan 1,000 mg BID - has not been on therapies for multiple months  Risk Factors: CAD, STEMI, HTN, male  LDL goal: <70 mg/dL  TG goal: <010 mg/dL  Diet: Avoids adding salt to his diet. Breakfast: boiled egg, oatmeal, or fruit. Lunch: [place] sandwich, Dinner: peas, corns, chicken, beef, Snacks: crackers, ice cream. Drinks water, 1/2 cup of sweet tea every few days, diet Mt. Dew. Uses sweet and low in  his coffee  Exercise: Walks ~1 mile every day.  Family History: The patient's family history includes Anxiety disorder in his brother.  Social History: Current smoker (3-4 cigarettes/day), denies illicit drug and alcohol use. Patient helps his mother and is usually unavailable on Thursdays and Fridays.  Labs:  11/15/19 TC 313 TG 1228 HDL 25 LDL 114, LFTs WNL (Fenofibrate 160 mg daily, rosuvastatin 40 mg daily, fish oil 4 g daily, niaspan 1,000 mg BID-non adherent)  05/17/19 - TC 502, TG 3306, HDL 15, LDL-D 54, LFTs WNL (Fenofibrate 160 mg daily, rosuvastatin 40 mg daily, fish oil 4 g daily, niaspan 1,000 mg BID-non adherent)  11/02/18 - TC 441, TG 2894, HDL 17, LDL-D 85, LFTs WNL (Fenofibrate 160 mg daily, rosuvastatin 40 mg daily, fish oil 4 g daily, niaspan 1,000 mg BID)  06/30/18 - TC 188, TG 643, HDL 30, LDL-D 65, LFTs WNL (resumed all prior meds)  03/24/2018- TC 284, TG 817, HDL 32, LDL-d 131, LFTs WNL (on rosuvastatin 40mg  and fenofibrate 145mg  daily, fish oil 4g daily, niacin 500mg  BID)  05/13/2017 - TC 293, TG 1833, HDL 28, LDL uncalc, LFTs WNL (on crestor 40mg  and fenofibrate 145mg  daily but had run out of his medication for 1 week)  03/25/2017 - TC 191, TG 927, HDL 35, LDL uncalc, LFTs wnl (on atorvastatin 80mg  daily)  02/03/2017: Hgb A1c = 5.5%      Past Medical History:  Diagnosis Date  . Aneurysm (HCC)   . Anxiety   .  CAD (coronary artery disease)    02/01/17 Cath DES--> OM, staged PCI DES -->mLAD, EF 55%  . COPD (chronic obstructive pulmonary disease) ([place])    smoker  . Hypertension    high with anxiety  . Myocardial infarction [facility])          Current Outpatient Medications on File Prior to Visit  Medication Sig Dispense Refill  . acetaminophen (TYLENOL) 500 MG tablet Take 1 tablet (500 mg total) by mouth every 6 (six) hours as needed for headache (pain). 30 tablet 0  . clopidogrel (PLAVIX) 75 MG tablet Take 1 tablet (75 mg total) by mouth daily. 30 tablet 11  . fenofibrate 160 MG  tablet Take 1 tablet (160 mg total) by mouth daily. 30 tablet 11  . lisinopril (ZESTRIL) 10 MG tablet Take 1 tablet (10 mg total) by mouth daily. 30 tablet 11  . metoprolol tartrate (LOPRESSOR) 25 MG tablet Take 1 tablet (25 mg total) by mouth 2 (two) times daily. 30 tablet 11  . niacin (NIASPAN) 1000 MG CR tablet Take 1 tablet (1,000 mg total) by mouth 2 (two) times daily. 60 tablet 11  . nitroGLYCERIN (NITROSTAT) 0.4 MG SL tablet Place 1 tablet (0.4 mg total) under the tongue every 5 (five) minutes as needed for chest pain. 25 tablet 3  . Omega-3 Fatty Acids (FISH OIL) 1000 MG CAPS Take 4 capsules (4,000 mg total) by mouth daily.  0  . rosuvastatin (CRESTOR) 40 MG tablet Take 1 tablet (40 mg total) by mouth daily. 30 tablet 11   No current facility-administered medications on file prior to visit.   No Known Allergies  Assessment/Plan:  1. Hyperlipidemia - LDL goal < 70 mg/dL and TG goal <150 mg/dL; therefore, patient is not at goal.  Insurance status?? Vascepa?? Fish oil rx?  Thank you for involving pharmacy to assist in providing Mr. Candee care.  Drexel Iha, PharmD  PGY2 Ambulatory Care Pharmacy Resident  Julieta Bellini, PharmD Candidate

## 2019-11-19 ENCOUNTER — Telehealth: Payer: Self-pay | Admitting: Pharmacist

## 2019-11-19 DIAGNOSIS — E785 Hyperlipidemia, unspecified: Secondary | ICD-10-CM

## 2019-11-19 NOTE — Telephone Encounter (Signed)
[facility] and atorvastatin. Called patient twice today to confirm he is taking medications and to schedule follow-up labs. No answer, so will try again in ~1 week.  Paulette Blanch, PharmD Candidate

## 2019-11-23 NOTE — Telephone Encounter (Signed)
Spoke to patient and he confirmed that he is taking fenofibrate and atorvastatin. The patient had no concerns or questions. Scheduled lipid panel and LFTs for March 8 at 8am. Will follow-up via phone call the next day to discuss lab results.   Paulette Blanch, PharmD Candidate

## 2019-11-29 ENCOUNTER — Telehealth: Payer: Self-pay | Admitting: Pharmacist

## 2019-11-29 NOTE — Telephone Encounter (Signed)
Called patient on 11/29/2019 at 4:55 PM and left HIPAA-compliant VM with instructions to call [facility] back   Plan to discuss smoking cessation and re-assess if he has contacted [place] [facility]  Thank you for involving pharmacy to assist in providing this patient's care.   Zachery Conch, PharmD PGY2 Ambulatory Care Pharmacy Resident

## 2019-12-06 ENCOUNTER — Telehealth: Payer: Self-pay | Admitting: Pharmacist

## 2019-12-06 NOTE — Telephone Encounter (Signed)
Called patient on 12/06/2019 at 3:49 PM  and left HIPAA-compliant VM with instructions to call [facility] back   Plan to discuss smoking cessation and re-assess if he has contacted [place] [facility]  Thank you for involving pharmacy to assist in providing this patient's care.   Zachery Conch, PharmD PGY2 Ambulatory Care Pharmacy Resident

## 2019-12-13 ENCOUNTER — Telehealth: Payer: Self-pay | Admitting: Pharmacist

## 2019-12-13 NOTE — Telephone Encounter (Signed)
Called patient on 12/13/2019 at 2:20 PM   Patient states he is tolerating all HLD medications and was able to successfully obtain HLD medications from pharmacy.  Asked patient how smoking cessation was going - he states he has not quit smoking yet. He has not reached out to [employer]. Asked patient if he would like phone number - patient politely declined stating he already has phone number. Asked patient if he would like me to follow up for additional support for smoking cessation - patient politely declined stating he would prefer to solely follow with [place] [facility]. Informed patient he can contact clinic if changes his mind and would like additional follow up regarding smoking cessation.   Thank you for involving pharmacy to assist in providing this patient's care.   Zachery Conch, PharmD PGY2 Ambulatory Care Pharmacy Resident

## 2020-01-03 ENCOUNTER — Other Ambulatory Visit: Payer: Self-pay | Admitting: *Deleted

## 2020-01-03 ENCOUNTER — Other Ambulatory Visit: Payer: Self-pay

## 2020-01-03 DIAGNOSIS — E785 Hyperlipidemia, unspecified: Secondary | ICD-10-CM

## 2020-01-03 LAB — HEPATIC FUNCTION PANEL
ALT: 23 IU/L (ref 0–44)
AST: 19 IU/L (ref 0–40)
Albumin: 4.5 g/dL (ref 3.8–4.8)
Alkaline Phosphatase: 73 IU/L (ref 39–117)
Bilirubin Total: <0.2 mg/dL (ref 0.0–1.2)
Bilirubin, Direct: 0.06 mg/dL (ref 0.00–0.40)
Total Protein: 6.7 g/dL (ref 6.0–8.5)

## 2020-01-03 LAB — LDL CHOLESTEROL, DIRECT: LDL Direct: 115 mg/dL — ABNORMAL HIGH (ref 0–99)

## 2020-01-03 LAB — LIPID PANEL
Chol/HDL Ratio: 7.8 ratio — ABNORMAL HIGH (ref 0.0–5.0)
Cholesterol, Total: 242 mg/dL — ABNORMAL HIGH (ref 100–199)
HDL: 31 mg/dL — ABNORMAL LOW (ref >39)
LDL Chol Calc (NIH): 88 mg/dL (ref 0–99)
Triglycerides: 748 mg/dL (ref 0–149)
VLDL Cholesterol Cal: 123 mg/dL — ABNORMAL HIGH (ref 5–40)

## 2020-01-09 NOTE — Progress Notes (Signed)
Patient ID: Anthony Coffey                 DOB: 1956/03/31                    MRN: [id number]     HPI: Anthony Coffey is a 64 y.o. male patient referred to lipid clinic by Dr. Tamala Julian. PMH is significant for CAD (STEMI 01/2017 w/ OM DES and mid LAD DES), HTN, COPD, and anxiety. Patient was last seen in lipid clinic in January 2021 and has been following with [place] since 2018.   Patient has had issues with noncompliance in the past. He was denied from the disability program application. He states he has applied to Medicaid, but has not heard a response. He is currently self-pay for medications and [facility] He has noted in the past he generally takes his medications in the morning, which are located above his refrigerator.   Patient presents for follow up appt with lipid clinic. Patient states he has been able to obtain atorvastatin and fenofibrate prescriptions from pharmacy without any issues. He reports he has been adherent to atorvastatin, fenofibrate, and fish oil. He is open to an injectable since his LDL remains elevated and is open to discussing the ORION trial. When performing medication reconciliation, patient states his nitroglycerin rx has expired.  Pt also mentions that he lives with his mother and is her caregiver. He has one brother who lives in [place], [place] (~30 min away).   Current Medications: fenofibrate 160 mg daily, atorvastatin 80 mg daily, fish oil 4000 mg daily Risk Factors: ASCVD (CAD, STEMI), HTN, current smoker LDL goal: <70 mg/dL TG goal: <150 mg/dL  Diet: Avoids adding salt to his diet. Breakfast: boiled egg, oatmeal, or fruit. Lunch: [place] sandwich, Dinner: peas, corns, chicken, beef, Snacks: crackers, ice cream. Drinkswater, 1/2 cup of sweet tea every few days, diet Mt. Dew.Uses sweet and low in his coffee  Exercise: Decreased from ~1 mile every day to walking for 15 minutes about 3x per week  Family History:  The patient's family history includes Anxiety disorder in his brother.  Social History: Current smoker (3-4 cigarettes/day), denies illicit drug and alcohol use. Patient helps his mother and is usually unavailable on Thursdays and Fridays.   Labs: 01/03/20 TC 242 TG 748 VLDL 123 HDL 31 LDL (direct) 115, LFTs WNL; (fenofibrate 160 mg daily, atorvastatin 80 mg daily, fish oil 4000 mg daily) 11/15/19 TC 313 TG 1228 HDL 25 LDL 114, LFTs WNL (fenofibrate 160 mg daily - non adherence) 05/17/19 - TC 502, TG 3306, HDL 15, LDL-D 54, LFTs WNL (Fenofibrate 160 mg daily, rosuvastatin 40 mg daily, fish oil 4 g daily, niaspan 1,000 mg BID-non adherent) 11/02/18 - TC 441, TG 2894, HDL 17, LDL-D 85, LFTs WNL (Fenofibrate 160 mg daily, rosuvastatin 40 mg daily, fish oil 4 g daily, niaspan 1,000 mg BID)  Past Medical History:  Diagnosis Date  . Aneurysm ([place])   . Anxiety   . CAD (coronary artery disease)    02/01/17 Cath DES--> OM, staged PCI DES -->mLAD, EF 55%  . COPD (chronic obstructive pulmonary disease) ([place])    smoker  . Hypertension    high with anxiety  . Myocardial infarction [facility])     Current Outpatient Medications on File Prior to Visit  Medication Sig Dispense Refill  . acetaminophen (TYLENOL) 500 MG tablet Take 1 tablet (500 mg total) by mouth  every 6 (six) hours as needed for headache (pain). 30 tablet 0  . atorvastatin (LIPITOR) 80 MG tablet Take 1 tablet (80 mg total) by mouth daily. 30 tablet 11  . clopidogrel (PLAVIX) 75 MG tablet Take 1 tablet (75 mg total) by mouth daily. 30 tablet 11  . fenofibrate 160 MG tablet Take 1 tablet (160 mg total) by mouth daily. 30 tablet 11  . lisinopril (ZESTRIL) 10 MG tablet Take 1 tablet (10 mg total) by mouth daily. 30 tablet 11  . metoprolol tartrate (LOPRESSOR) 25 MG tablet Take 1 tablet (25 mg total) by mouth 2 (two) times daily. 30 tablet 11  . nitroGLYCERIN (NITROSTAT) 0.4 MG SL tablet Place 1 tablet (0.4 mg total) under the tongue every 5 (five)  minutes as needed for chest pain. 25 tablet 3  . Omega-3 Fatty Acids (FISH OIL) 1000 MG CAPS Take 4 capsules (4,000 mg total) by mouth daily.  0   No current facility-administered medications on file prior to visit.    No Known Allergies  Assessment/Plan:  1. Hyperlipidemia - LDL goal < 70 mg/dL and TG goal <101 mg/dL; therefore, patient is not at goal. TG have decreased since last lipid panel, however, LDL is about the same. Patient reports adherence to atorvastatin 80 mg daily, fenofibrate 160 mg daily, and fish oil 4000 mg daily. There are no other medication options to lower TG considering patient is self-pay and even with copay card Vascepa will be too expensive. Patient is willing to take injectable medication. After discussion of ORION trial, patient states he would be interested in participating in the trial. Informed patient a research nurse will be reaching out to contact him regarding more information on enrollment in ORION trial. Continue atorvastatin 80 mg daily, fenofibrate 160 mg daily, and fish oil 4000 mg daily. Advised patient to contact clinic regarding lipid management after ORION trial if LDL and TG remain elevated to discuss future options (may be other more affordable medications at that time). Patient verbalized understanding.  2. Nitroglycerin refill - Sent in refill to his preferred pharmacy  Thank you for involving pharmacy to assist in providing Mr. [employer] care.   Zachery Conch, PharmD PGY2 Ambulatory Care Pharmacy Resident

## 2020-01-11 ENCOUNTER — Ambulatory Visit (INDEPENDENT_AMBULATORY_CARE_PROVIDER_SITE_OTHER): Payer: Self-pay | Admitting: Pharmacist

## 2020-01-11 ENCOUNTER — Other Ambulatory Visit: Payer: Self-pay

## 2020-01-11 DIAGNOSIS — E785 Hyperlipidemia, unspecified: Secondary | ICD-10-CM

## 2020-01-11 DIAGNOSIS — E781 Pure hyperglyceridemia: Secondary | ICD-10-CM

## 2020-01-11 MED ORDER — NITROGLYCERIN 0.4 MG SL SUBL: 0.4000 mg | SUBLINGUAL_TABLET | SUBLINGUAL | 3 refills | Status: DC | PRN

## 2020-01-11 NOTE — Patient Instructions (Signed)
It was a pleasure seeing you in clinic today Mr. Kassim!  Today the plan is... 1. A research nurse will be contacting you about being enrolled in the [place] trial 2. Try to eat healthier food choices and increase your exercise to 150 minutes per week 3. Continue taking fenofibrate, atorvastatin, and fish oil  Please call the PharmD clinic at [phone] if you have any questions that you would like to speak with a pharmacist about Corrie Dandy, [place], [place]).

## 2020-01-19 ENCOUNTER — Encounter: Payer: Self-pay | Admitting: *Deleted

## 2020-01-19 ENCOUNTER — Other Ambulatory Visit: Payer: Self-pay

## 2020-01-19 DIAGNOSIS — Z006 Encounter for examination for normal comparison and control in clinical research program: Secondary | ICD-10-CM

## 2020-01-19 NOTE — Research (Signed)
Patient to research clinic for screening visit in the [place] research study.  Explained study to patient and answered any and all questions.  [place] 4  Informed Consent   Subject Name: Anthony Coffey  Subject met inclusion and exclusion criteria.  The informed consent form, study requirements and expectations were reviewed with the subject and questions and concerns were addressed prior to the signing of the consent form.  The subject verbalized understanding of the trial requirements.  The subject agreed to participate in the [place] 4 trial and signed the informed consent  on 01/19/2020.  The informed consent was obtained prior to performance of any protocol-specific procedures for the subject.  A copy of the signed informed consent was given to the subject and a copy was placed in the subject's medical record.   [name].    Total cholesterol 280 mg/dL and the patient was given an initial run in injection.  Pt tolerated well and the next clinic appointment was scheduled.

## 2020-01-21 ENCOUNTER — Emergency Department (HOSPITAL_COMMUNITY): Payer: Self-pay

## 2020-01-21 ENCOUNTER — Inpatient Hospital Stay (HOSPITAL_COMMUNITY)
Admission: EM | Admit: 2020-01-21 | Discharge: 2020-01-24 | DRG: 482 | Disposition: A | Discharge status: Home / Self Care | Payer: Self-pay | Attending: Internal Medicine | Admitting: Internal Medicine

## 2020-01-21 DIAGNOSIS — Z955 Presence of coronary angioplasty implant and graft: Secondary | ICD-10-CM

## 2020-01-21 DIAGNOSIS — J449 Chronic obstructive pulmonary disease, unspecified: Secondary | ICD-10-CM | POA: Diagnosis present

## 2020-01-21 DIAGNOSIS — Z419 Encounter for procedure for purposes other than remedying health state, unspecified: Secondary | ICD-10-CM

## 2020-01-21 DIAGNOSIS — R338 Other retention of urine: Secondary | ICD-10-CM | POA: Diagnosis present

## 2020-01-21 DIAGNOSIS — W19XXXA Unspecified fall, initial encounter: Secondary | ICD-10-CM

## 2020-01-21 DIAGNOSIS — W109XXA Fall (on) (from) unspecified stairs and steps, initial encounter: Secondary | ICD-10-CM | POA: Diagnosis present

## 2020-01-21 DIAGNOSIS — I10 Essential (primary) hypertension: Secondary | ICD-10-CM | POA: Diagnosis present

## 2020-01-21 DIAGNOSIS — I2510 Atherosclerotic heart disease of native coronary artery without angina pectoris: Secondary | ICD-10-CM | POA: Diagnosis present

## 2020-01-21 DIAGNOSIS — Z20822 Contact with and (suspected) exposure to covid-19: Secondary | ICD-10-CM | POA: Diagnosis present

## 2020-01-21 DIAGNOSIS — W010XXA Fall on same level from slipping, tripping and stumbling without subsequent striking against object, initial encounter: Secondary | ICD-10-CM | POA: Diagnosis present

## 2020-01-21 DIAGNOSIS — R339 Retention of urine, unspecified: Secondary | ICD-10-CM | POA: Diagnosis present

## 2020-01-21 DIAGNOSIS — Z720 Tobacco use: Secondary | ICD-10-CM | POA: Diagnosis present

## 2020-01-21 DIAGNOSIS — Y9301 Activity, walking, marching and hiking: Secondary | ICD-10-CM | POA: Diagnosis present

## 2020-01-21 DIAGNOSIS — Y92008 Other place in unspecified non-institutional (private) residence as the place of occurrence of the external cause: Secondary | ICD-10-CM

## 2020-01-21 DIAGNOSIS — Z7902 Long term (current) use of antithrombotics/antiplatelets: Secondary | ICD-10-CM

## 2020-01-21 DIAGNOSIS — S72002A Fracture of unspecified part of neck of left femur, initial encounter for closed fracture: Secondary | ICD-10-CM | POA: Diagnosis present

## 2020-01-21 DIAGNOSIS — S7222XA Displaced subtrochanteric fracture of left femur, initial encounter for closed fracture: Principal | ICD-10-CM | POA: Diagnosis present

## 2020-01-21 DIAGNOSIS — I252 Old myocardial infarction: Secondary | ICD-10-CM

## 2020-01-21 DIAGNOSIS — S72142A Displaced intertrochanteric fracture of left femur, initial encounter for closed fracture: Secondary | ICD-10-CM | POA: Diagnosis present

## 2020-01-21 DIAGNOSIS — Z87891 Personal history of nicotine dependence: Secondary | ICD-10-CM

## 2020-01-21 DIAGNOSIS — I25119 Atherosclerotic heart disease of native coronary artery with unspecified angina pectoris: Secondary | ICD-10-CM | POA: Diagnosis present

## 2020-01-21 DIAGNOSIS — Z79899 Other long term (current) drug therapy: Secondary | ICD-10-CM

## 2020-01-21 DIAGNOSIS — E785 Hyperlipidemia, unspecified: Secondary | ICD-10-CM | POA: Diagnosis present

## 2020-01-21 DIAGNOSIS — Z8679 Personal history of other diseases of the circulatory system: Secondary | ICD-10-CM

## 2020-01-21 MED ORDER — MORPHINE SULFATE (PF) 4 MG/ML IV SOLN
4.0000 mg | Freq: Once | INTRAVENOUS | Status: AC
  Administered 2020-01-21: 4 mg via INTRAVENOUS
  Filled 2020-01-21: qty 1

## 2020-01-21 MED ORDER — MORPHINE SULFATE (PF) 4 MG/ML IV SOLN
4.0000 mg | Freq: Once | INTRAVENOUS | Status: AC
  Administered 2020-01-22: 4 mg via INTRAVENOUS
  Filled 2020-01-21: qty 1

## 2020-01-21 NOTE — ED Notes (Signed)
Pt to MRI

## 2020-01-21 NOTE — ED Notes (Signed)
Pt returned from Mri.

## 2020-01-21 NOTE — ED Provider Notes (Signed)
[facility] EMERGENCY DEPARTMENT Provider Note   CSN: [id number] Arrival date & time: 01/21/20  1949     History Chief Complaint  Patient presents with  . Fall    Anthony Coffey is a 64 y.o. male.  HPI Patient is a 64 year old male with a PMH of CAD, hyperlipidemia and hypertension presenting to the ED today due to hip pain following a fall.  Patient says that at approximately 1400, he slipped and fell down some steps.  He says that he landed on his left thigh.  He denies hitting his head.  He has been unable to bear weight on the lower extremity since the fall.  He denies any other areas of injury or pain.    Past Medical History:  Diagnosis Date  . Aneurysm (HCC)   . Anxiety   . CAD (coronary artery disease)    02/01/17 Cath DES--> OM, staged PCI DES -->mLAD, EF 55%  . COPD (chronic obstructive pulmonary disease) (HCC)    smoker  . Hypertension    high with anxiety  . Myocardial infarction [facility])     Patient Active Problem List   Diagnosis Date Noted  . Hypertriglyceridemia 01/21/2018  . Medication management 04/16/2017  . Essential hypertension 03/25/2017  . Hyperlipidemia LDL goal <70 02/04/2017  . Tobacco abuse 02/04/2017  . Old MI (myocardial infarction), inferior ST elevation infarction 02/01/2017  . Coronary artery disease involving native coronary artery of native heart with angina pectoris [facility])     Past Surgical History:  Procedure Laterality Date  . ABDOMINAL AORTIC ANEURYSM REPAIR    . CORONARY STENT INTERVENTION N/A 02/01/2017   Procedure: Coronary Stent Intervention;  Surgeon: Lyn Records, MD;  Location: [facility] INVASIVE CV LAB;  Service: Cardiovascular;  Laterality: N/A;  . CORONARY STENT INTERVENTION N/A 02/03/2017   Procedure: Coronary Stent Intervention;  Surgeon: Tonny Bollman, MD;  Location: [facility] INVASIVE CV LAB;  Service: Cardiovascular;  Laterality: N/A;  . EAR CYST EXCISION Right 06/14/2013   Procedure: CYST REMOVAL RIGHT MANDIBLE;   Surgeon: Georgia Lopes, DDS;  Location: [facility] OR;  Service: Oral Surgery;  Laterality: Right;  . LEFT HEART CATH AND CORONARY ANGIOGRAPHY N/A 02/01/2017   Procedure: Left Heart Cath and Coronary Angiography;  Surgeon: Lyn Records, MD;  Location: [facility] INVASIVE CV LAB;  Service: Cardiovascular;  Laterality: N/A;  . TONSILLECTOMY    . TOOTH EXTRACTION N/A 06/14/2013   Procedure: EXTRACTIONS 17, 23, 26, 27, 29, 32;  Surgeon: Georgia Lopes, DDS;  Location: [facility] OR;  Service: Oral Surgery;  Laterality: N/A;  . VASCULAR SURGERY         Family History  Problem Relation Age of Onset  . Anxiety disorder Brother     Social History   Tobacco Use  . Smoking status: Current Every Day Smoker    Packs/day: 1.00    Years: 39.00    Pack years: 39.00    Types: Cigarettes  . Smokeless tobacco: Former Engineer, water Use Topics  . Alcohol use: No  . Drug use: No    Home Medications Prior to Admission medications   Medication Sig Start Date End Date Taking? Authorizing Provider  acetaminophen (TYLENOL) 500 MG tablet Take 1 tablet (500 mg total) by mouth every 6 (six) hours as needed for headache (pain). 02/04/17   Arty Baumgartner, NP  atorvastatin (LIPITOR) 80 MG tablet Take 1 tablet (80 mg total) by mouth daily. 11/16/19 12/16/19  Lyn Records, MD  clopidogrel (  PLAVIX) 75 MG tablet Take 1 tablet (75 mg total) by mouth daily. 07/07/18   Belva Crome, MD  fenofibrate 160 MG tablet Take 1 tablet (160 mg total) by mouth daily. 11/16/19   Belva Crome, MD  lisinopril (ZESTRIL) 10 MG tablet Take 1 tablet (10 mg total) by mouth daily. 09/28/19   Sande Rives E, PA-C  metoprolol tartrate (LOPRESSOR) 25 MG tablet Take 1 tablet (25 mg total) by mouth 2 (two) times daily. 09/28/19   Sande Rives E, PA-C  nitroGLYCERIN (NITROSTAT) 0.4 MG SL tablet Place 1 tablet (0.4 mg total) under the tongue every 5 (five) minutes as needed for chest pain. 01/11/20   Belva Crome, MD  Omega-3 Fatty Acids (FISH OIL)  1000 MG CAPS Take 4 capsules (4,000 mg total) by mouth daily. 07/03/17   Belva Crome, MD    Allergies    Patient has no known allergies.  Review of Systems   Review of Systems  Constitutional: Negative for chills and fever.  HENT: Negative for rhinorrhea and sore throat.   Eyes: Negative for pain and visual disturbance.  Respiratory: Negative for cough and shortness of breath.   Cardiovascular: Negative for chest pain and palpitations.  Gastrointestinal: Negative for abdominal pain, diarrhea, nausea and vomiting.  Genitourinary: Negative for dysuria and hematuria.  Musculoskeletal: Positive for arthralgias and gait problem. Negative for back pain.  Skin: Negative for rash and wound.  Neurological: Negative for weakness and headaches.  Psychiatric/Behavioral: Negative for agitation.  All other systems reviewed and are negative.   Physical Exam Updated Vital Signs BP 137/84 (BP Location: Right Arm)   Pulse 72   Temp 99 F (37.2 C) (Oral)   Resp 14   SpO2 98%   Physical Exam Vitals and nursing note reviewed.  Constitutional:      General: He is not in acute distress.    Appearance: Normal appearance. He is well-developed. He is not ill-appearing.  HENT:     Head: Normocephalic and atraumatic.     Right Ear: External ear normal.     Left Ear: External ear normal.     Nose: Nose normal. No congestion or rhinorrhea.     Mouth/Throat:     Mouth: Mucous membranes are moist.     Pharynx: Oropharynx is clear.  Eyes:     Extraocular Movements: Extraocular movements intact.     Pupils: Pupils are equal, round, and reactive to light.  Cardiovascular:     Rate and Rhythm: Normal rate and regular rhythm.     Pulses: Normal pulses.     Heart sounds: Normal heart sounds.  Pulmonary:     Effort: Pulmonary effort is normal. No respiratory distress.     Breath sounds: Normal breath sounds.  Abdominal:     General: There is no distension.     Palpations: Abdomen is soft.      Tenderness: There is no abdominal tenderness.  Musculoskeletal:     Cervical back: Normal range of motion and neck supple.     Comments: TTP of L proximal thigh and mild tenderness over superior portion of L knee. ROM of L hip and knee limited 2/2 pain. Pedal pulse intact. Ankle strength 5/5 on affected foot.   Skin:    General: Skin is warm and dry.     Capillary Refill: Capillary refill takes less than 2 seconds.  Neurological:     General: No focal deficit present.     Mental Status: He is alert  and oriented to person, place, and time. Mental status is at baseline.  Psychiatric:        Mood and Affect: Mood normal.     ED Results / Procedures / Treatments   Labs (all labs ordered are listed, but only abnormal results are displayed) Labs Reviewed - No data to display  EKG None  Radiology DG Pelvis 1-2 Views  Result Date: 01/21/2020 CLINICAL DATA:  Pain status post fall EXAM: PELVIS - 1-2 VIEW COMPARISON:  None. FINDINGS: There is no acute displaced fracture or dislocation. Mild-to-moderate degenerative changes are noted of both hips. The osseous mineralization is within normal limits. Atherosclerotic changes are noted of the visualized vasculature. IMPRESSION: No acute displaced fracture or dislocation. Mild-to-moderate degenerative changes of both hips. Electronically Signed   By: Katherine Mantle M.D.   On: 01/21/2020 21:19   DG Knee 2 Views Left  Result Date: 01/21/2020 CLINICAL DATA:  Left knee pain after fall. EXAM: LEFT KNEE - 1-2 VIEW COMPARISON:  Femur radiograph earlier today. FINDINGS: No evidence of fracture, dislocation, or joint effusion. Mild patellofemoral spurring. Joint spaces are grossly preserved. Soft tissue edema anteriorly. There are vascular calcifications. IMPRESSION: Anterior soft tissue edema. No fracture or dislocation. Electronically Signed   By: Narda Rutherford M.D.   On: 01/21/2020 22:11   DG FEMUR MIN 2 VIEWS LEFT  Result Date: 01/21/2020 CLINICAL  DATA:  Pain status post fall EXAM: LEFT FEMUR 2 VIEWS COMPARISON:  None. FINDINGS: There is no acute displaced fracture or dislocation. Moderate degenerative changes are noted of left hip. There are vascular calcifications. IMPRESSION: Negative. Electronically Signed   By: Katherine Mantle M.D.   On: 01/21/2020 21:20    Procedures Procedures (including critical care time)  Medications Ordered in ED Medications  morphine 4 MG/ML injection 4 mg (4 mg Intravenous Given 01/21/20 2029)  morphine 4 MG/ML injection 4 mg (4 mg Intravenous Given 01/21/20 2153)    ED Course  I have reviewed the triage vital signs and the nursing notes.  Pertinent labs & imaging results that were available during my care of the patient were reviewed by me and considered in my medical decision making (see chart for details).    MDM Rules/Calculators/A&P                     Patient is a 64 year old male with a PMH of CAD, hyperlipidemia and hypertension presenting to the ED today due to hip pain following a fall.  On exam, patient has tenderness to palpation of left proximal thigh and limited range of motion of left lower extremity secondary to pain.  Vital signs stable.  Afebrile.  On arrival, patient appears generally well and is displaying no signs of acute distress.  X-rays of the left hip and femur collected in triage show no acute injuries.  Collected x-rays of the left knee given patient's report of tenderness in this area.  Patient's foot appears neurovascularly intact.  Patient's pain managed with morphine.  Patient denies hitting his head and has no other obvious signs of injury - no need for any further imaging.  Attempted to stand patient up to see if he could bear weight on the extremity but he was not able to move his leg due to severe pain.  Obtained MRI of the left hip which showed a nondisplaced incomplete fracture through the greater trochanter.  Orthopedic team consulted and will evaluate him following  admission.  At this time, patient handed off to oncoming  provider.  Patient stable at time of handoff.  Patient assessed and evaluated with Dr. Fredderick Phenix.  Delray Alt, MD   Final Clinical Impression(s) / ED Diagnoses Final diagnoses:  Closed fracture of left hip, initial encounter [facility])    Rx / DC Orders ED Discharge Orders    None       Delray Alt, MD 01/22/20 3664    Rolan Bucco, MD 01/22/20 2244

## 2020-01-21 NOTE — ED Triage Notes (Signed)
Pt coming in after fall earlier today around 2pm from approx 6 feet onto the grass, front a building. No LOC. No blood thinners. Extreme pain noted in left hip. Pt received 100 mcg of Fentanyl and brought his pain down from 10 to a 7

## 2020-01-22 ENCOUNTER — Emergency Department (HOSPITAL_COMMUNITY): Payer: Self-pay

## 2020-01-22 ENCOUNTER — Encounter (HOSPITAL_COMMUNITY): Payer: Self-pay | Admitting: Internal Medicine

## 2020-01-22 ENCOUNTER — Encounter (HOSPITAL_COMMUNITY)
Admission: EM | Disposition: A | Discharge status: Home / Self Care | Payer: Self-pay | Source: Home / Self Care | Attending: Internal Medicine

## 2020-01-22 ENCOUNTER — Inpatient Hospital Stay (HOSPITAL_COMMUNITY): Payer: Self-pay | Admitting: Anesthesiology

## 2020-01-22 ENCOUNTER — Inpatient Hospital Stay (HOSPITAL_COMMUNITY): Payer: Self-pay

## 2020-01-22 DIAGNOSIS — R338 Other retention of urine: Secondary | ICD-10-CM

## 2020-01-22 DIAGNOSIS — Z720 Tobacco use: Secondary | ICD-10-CM

## 2020-01-22 DIAGNOSIS — Z419 Encounter for procedure for purposes other than remedying health state, unspecified: Secondary | ICD-10-CM

## 2020-01-22 DIAGNOSIS — S72002A Fracture of unspecified part of neck of left femur, initial encounter for closed fracture: Secondary | ICD-10-CM | POA: Diagnosis present

## 2020-01-22 HISTORY — PX: INTRAMEDULLARY (IM) NAIL INTERTROCHANTERIC: SHX5875

## 2020-01-22 LAB — URINALYSIS, COMPLETE (UACMP) WITH MICROSCOPIC
Bacteria, UA: NONE SEEN
Bilirubin Urine: NEGATIVE
Glucose, UA: NEGATIVE mg/dL
Ketones, ur: NEGATIVE mg/dL
Leukocytes,Ua: NEGATIVE
Nitrite: NEGATIVE
Protein, ur: NEGATIVE mg/dL
Specific Gravity, Urine: 1.019 (ref 1.005–1.030)
pH: 5 (ref 5.0–8.0)

## 2020-01-22 LAB — BASIC METABOLIC PANEL
Anion gap: 10 (ref 5–15)
BUN: 10 mg/dL (ref 8–23)
CO2: 21 mmol/L — ABNORMAL LOW (ref 22–32)
Calcium: 9 mg/dL (ref 8.9–10.3)
Chloride: 104 mmol/L (ref 98–111)
Creatinine, Ser: 0.86 mg/dL (ref 0.61–1.24)
GFR calc Af Amer: >60 mL/min (ref >60)
GFR calc non Af Amer: >60 mL/min (ref >60)
Glucose, Bld: 97 mg/dL (ref 70–99)
Potassium: 3.8 mmol/L (ref 3.5–5.1)
Sodium: 135 mmol/L (ref 135–145)

## 2020-01-22 LAB — CBC
HCT: 41.6 % (ref 39.0–52.0)
HCT: 41.7 % (ref 39.0–52.0)
Hemoglobin: 13.8 g/dL (ref 13.0–17.0)
Hemoglobin: 14.3 g/dL (ref 13.0–17.0)
MCH: 29.6 pg (ref 26.0–34.0)
MCH: 30.5 pg (ref 26.0–34.0)
MCHC: 33.2 g/dL (ref 30.0–36.0)
MCHC: 34.3 g/dL (ref 30.0–36.0)
MCV: 89.3 fL (ref 80.0–100.0)
MCV: 88.9 fL (ref 80.0–100.0)
Platelets: 254 10*3/uL (ref 150–400)
Platelets: 247 10*3/uL (ref 150–400)
RBC: 4.66 MIL/uL (ref 4.22–5.81)
RBC: 4.69 MIL/uL (ref 4.22–5.81)
RDW: 13.1 % (ref 11.5–15.5)
RDW: 13.1 % (ref 11.5–15.5)
WBC: 12.6 10*3/uL — ABNORMAL HIGH (ref 4.0–10.5)
WBC: 9.1 10*3/uL (ref 4.0–10.5)
nRBC: 0 % (ref 0.0–0.2)
nRBC: 0 % (ref 0.0–0.2)

## 2020-01-22 LAB — PROTIME-INR
INR: 1 (ref 0.8–1.2)
Prothrombin Time: 12.8 seconds (ref 11.4–15.2)

## 2020-01-22 LAB — SURGICAL PCR SCREEN
MRSA, PCR: NEGATIVE
Staphylococcus aureus: NEGATIVE

## 2020-01-22 LAB — VITAMIN D 25 HYDROXY (VIT D DEFICIENCY, FRACTURES): Vit D, 25-Hydroxy: 8.41 ng/mL — ABNORMAL LOW (ref 30–100)

## 2020-01-22 LAB — CREATININE, SERUM
Creatinine, Ser: 0.69 mg/dL (ref 0.61–1.24)
GFR calc Af Amer: >60 mL/min (ref >60)
GFR calc non Af Amer: >60 mL/min (ref >60)

## 2020-01-22 LAB — HIV ANTIBODY (ROUTINE TESTING W REFLEX): HIV Screen 4th Generation wRfx: NONREACTIVE

## 2020-01-22 LAB — SARS CORONAVIRUS 2 (TAT 6-24 HRS): SARS Coronavirus 2: NEGATIVE

## 2020-01-22 SURGERY — FIXATION, FRACTURE, INTERTROCHANTERIC, WITH INTRAMEDULLARY ROD
Anesthesia: General | Laterality: Left

## 2020-01-22 MED ORDER — CEFAZOLIN SODIUM-DEXTROSE 2-4 GM/100ML-% IV SOLN
2.0000 g | Freq: Once | INTRAVENOUS | Status: AC
  Administered 2020-01-22: 11:00:00 2 g via INTRAVENOUS

## 2020-01-22 MED ORDER — EPHEDRINE SULFATE-NACL 50-0.9 MG/10ML-% IV SOSY
PREFILLED_SYRINGE | INTRAVENOUS | Status: DC | PRN
  Administered 2020-01-22 (×4): 10 mg via INTRAVENOUS

## 2020-01-22 MED ORDER — ONDANSETRON HCL 4 MG/2ML IJ SOLN
INTRAMUSCULAR | Status: AC
  Filled 2020-01-22: qty 2

## 2020-01-22 MED ORDER — DEXAMETHASONE SODIUM PHOSPHATE 10 MG/ML IJ SOLN
INTRAMUSCULAR | Status: AC
  Filled 2020-01-22: qty 1

## 2020-01-22 MED ORDER — NICOTINE 14 MG/24HR TD PT24
14.0000 mg | MEDICATED_PATCH | Freq: Every day | TRANSDERMAL | Status: DC
  Administered 2020-01-22 – 2020-01-24 (×3): 14 mg via TRANSDERMAL
  Filled 2020-01-22 (×3): qty 1

## 2020-01-22 MED ORDER — CEFAZOLIN SODIUM-DEXTROSE 1-4 GM/50ML-% IV SOLN
1.0000 g | Freq: Four times a day (QID) | INTRAVENOUS | Status: AC
  Administered 2020-01-22 – 2020-01-23 (×2): 1 g via INTRAVENOUS
  Filled 2020-01-22 (×3): qty 50

## 2020-01-22 MED ORDER — PROPOFOL 10 MG/ML IV BOLUS
INTRAVENOUS | Status: DC | PRN
  Administered 2020-01-22: 140 mg via INTRAVENOUS

## 2020-01-22 MED ORDER — PHENYLEPHRINE HCL (PRESSORS) 10 MG/ML IV SOLN
INTRAVENOUS | Status: DC | PRN
  Administered 2020-01-22 (×5): 80 ug via INTRAVENOUS

## 2020-01-22 MED ORDER — PHENYLEPHRINE 40 MCG/ML (10ML) SYRINGE FOR IV PUSH (FOR BLOOD PRESSURE SUPPORT)
PREFILLED_SYRINGE | INTRAVENOUS | Status: AC
  Filled 2020-01-22: qty 10

## 2020-01-22 MED ORDER — PHENYLEPHRINE HCL-NACL 10-0.9 MG/250ML-% IV SOLN
INTRAVENOUS | Status: DC | PRN
  Administered 2020-01-22: 50 ug/min via INTRAVENOUS

## 2020-01-22 MED ORDER — HYDROCODONE-ACETAMINOPHEN 7.5-325 MG PO TABS
1.0000 | ORAL_TABLET | ORAL | Status: DC | PRN
  Administered 2020-01-22 – 2020-01-23 (×6): 2 via ORAL
  Administered 2020-01-23: 1 via ORAL
  Administered 2020-01-23 – 2020-01-24 (×4): 2 via ORAL
  Filled 2020-01-22: qty 1
  Filled 2020-01-22 (×10): qty 2

## 2020-01-22 MED ORDER — METOPROLOL TARTRATE 25 MG PO TABS
25.0000 mg | ORAL_TABLET | Freq: Two times a day (BID) | ORAL | Status: DC
  Administered 2020-01-22 – 2020-01-24 (×5): 25 mg via ORAL
  Filled 2020-01-22 (×5): qty 1

## 2020-01-22 MED ORDER — HYDROMORPHONE HCL 1 MG/ML IJ SOLN
0.2500 mg | INTRAMUSCULAR | Status: DC | PRN
  Administered 2020-01-22 (×2): 0.25 mg via INTRAVENOUS

## 2020-01-22 MED ORDER — ENSURE ENLIVE PO LIQD
237.0000 mL | Freq: Every day | ORAL | Status: DC
  Administered 2020-01-23: 237 mL via ORAL

## 2020-01-22 MED ORDER — ACETAMINOPHEN 500 MG PO TABS
500.0000 mg | ORAL_TABLET | Freq: Four times a day (QID) | ORAL | Status: AC
  Administered 2020-01-22 (×2): 500 mg via ORAL
  Filled 2020-01-22 (×2): qty 1

## 2020-01-22 MED ORDER — METOCLOPRAMIDE HCL 5 MG/ML IJ SOLN: 5.0000 mg | Freq: Three times a day (TID) | INTRAMUSCULAR | Status: DC | PRN

## 2020-01-22 MED ORDER — MORPHINE SULFATE (PF) 2 MG/ML IV SOLN
0.5000 mg | INTRAVENOUS | Status: DC | PRN
  Administered 2020-01-22: 1 mg via INTRAVENOUS
  Filled 2020-01-22: qty 1

## 2020-01-22 MED ORDER — MEPERIDINE HCL 25 MG/ML IJ SOLN: 6.2500 mg | INTRAMUSCULAR | Status: DC | PRN

## 2020-01-22 MED ORDER — CHLORHEXIDINE GLUCONATE CLOTH 2 % EX PADS: 6.0000 | MEDICATED_PAD | Freq: Every day | CUTANEOUS | Status: DC

## 2020-01-22 MED ORDER — PROMETHAZINE HCL 25 MG/ML IJ SOLN: 6.2500 mg | INTRAMUSCULAR | Status: DC | PRN

## 2020-01-22 MED ORDER — MIDAZOLAM HCL 5 MG/5ML IJ SOLN
INTRAMUSCULAR | Status: DC | PRN
  Administered 2020-01-22: 2 mg via INTRAVENOUS

## 2020-01-22 MED ORDER — ATORVASTATIN CALCIUM 80 MG PO TABS
80.0000 mg | ORAL_TABLET | Freq: Every day | ORAL | Status: DC
  Administered 2020-01-22 – 2020-01-24 (×3): 80 mg via ORAL
  Filled 2020-01-22 (×3): qty 1

## 2020-01-22 MED ORDER — MORPHINE SULFATE (PF) 2 MG/ML IV SOLN
2.0000 mg | INTRAVENOUS | Status: DC | PRN
  Administered 2020-01-22 (×3): 2 mg via INTRAVENOUS
  Filled 2020-01-22 (×3): qty 1

## 2020-01-22 MED ORDER — ADULT MULTIVITAMIN W/MINERALS CH
1.0000 | ORAL_TABLET | Freq: Every day | ORAL | Status: DC
  Administered 2020-01-23 – 2020-01-24 (×2): 1 via ORAL
  Filled 2020-01-22 (×2): qty 1

## 2020-01-22 MED ORDER — SUGAMMADEX SODIUM 200 MG/2ML IV SOLN
INTRAVENOUS | Status: DC | PRN
  Administered 2020-01-22: 200 mg via INTRAVENOUS

## 2020-01-22 MED ORDER — LACTATED RINGERS IV SOLN: INTRAVENOUS | Status: DC

## 2020-01-22 MED ORDER — LIDOCAINE 2% (20 MG/ML) 5 ML SYRINGE
INTRAMUSCULAR | Status: AC
  Filled 2020-01-22: qty 5

## 2020-01-22 MED ORDER — CLOPIDOGREL BISULFATE 75 MG PO TABS
75.0000 mg | ORAL_TABLET | Freq: Every day | ORAL | Status: DC
  Administered 2020-01-23 – 2020-01-24 (×2): 75 mg via ORAL
  Filled 2020-01-22 (×2): qty 1

## 2020-01-22 MED ORDER — ROCURONIUM BROMIDE 10 MG/ML (PF) SYRINGE
PREFILLED_SYRINGE | INTRAVENOUS | Status: AC
  Filled 2020-01-22: qty 10

## 2020-01-22 MED ORDER — METOCLOPRAMIDE HCL 5 MG PO TABS: 5.0000 mg | ORAL_TABLET | Freq: Three times a day (TID) | ORAL | Status: DC | PRN

## 2020-01-22 MED ORDER — MORPHINE SULFATE (PF) 2 MG/ML IV SOLN
2.0000 mg | Freq: Once | INTRAVENOUS | Status: AC
  Administered 2020-01-22: 2 mg via INTRAVENOUS
  Filled 2020-01-22: qty 1

## 2020-01-22 MED ORDER — ROCURONIUM BROMIDE 10 MG/ML (PF) SYRINGE
PREFILLED_SYRINGE | INTRAVENOUS | Status: DC | PRN
  Administered 2020-01-22: 50 mg via INTRAVENOUS

## 2020-01-22 MED ORDER — PROPOFOL 10 MG/ML IV BOLUS
INTRAVENOUS | Status: AC
  Filled 2020-01-22: qty 20

## 2020-01-22 MED ORDER — ONDANSETRON HCL 4 MG PO TABS: 4.0000 mg | ORAL_TABLET | Freq: Four times a day (QID) | ORAL | Status: DC | PRN

## 2020-01-22 MED ORDER — FENTANYL CITRATE (PF) 100 MCG/2ML IJ SOLN
INTRAMUSCULAR | Status: DC | PRN
  Administered 2020-01-22: 50 ug via INTRAVENOUS

## 2020-01-22 MED ORDER — HYDROCODONE-ACETAMINOPHEN 5-325 MG PO TABS
1.0000 | ORAL_TABLET | ORAL | Status: DC | PRN
  Administered 2020-01-23: 2 via ORAL
  Filled 2020-01-22: qty 2

## 2020-01-22 MED ORDER — LISINOPRIL 10 MG PO TABS
10.0000 mg | ORAL_TABLET | Freq: Every day | ORAL | Status: DC
  Administered 2020-01-22 – 2020-01-24 (×2): 10 mg via ORAL
  Filled 2020-01-22 (×3): qty 1

## 2020-01-22 MED ORDER — LIDOCAINE 2% (20 MG/ML) 5 ML SYRINGE
INTRAMUSCULAR | Status: DC | PRN
  Administered 2020-01-22: 100 mg via INTRAVENOUS

## 2020-01-22 MED ORDER — FENOFIBRATE 160 MG PO TABS
160.0000 mg | ORAL_TABLET | Freq: Every day | ORAL | Status: DC
  Administered 2020-01-22 – 2020-01-24 (×3): 160 mg via ORAL
  Filled 2020-01-22 (×4): qty 1

## 2020-01-22 MED ORDER — DOCUSATE SODIUM 100 MG PO CAPS
100.0000 mg | ORAL_CAPSULE | Freq: Two times a day (BID) | ORAL | Status: DC
  Administered 2020-01-22 – 2020-01-24 (×5): 100 mg via ORAL
  Filled 2020-01-22 (×4): qty 1

## 2020-01-22 MED ORDER — ENOXAPARIN SODIUM 40 MG/0.4ML ~~LOC~~ SOLN
40.0000 mg | SUBCUTANEOUS | Status: DC
  Administered 2020-01-23 – 2020-01-24 (×2): 40 mg via SUBCUTANEOUS
  Filled 2020-01-22 (×2): qty 0.4

## 2020-01-22 MED ORDER — HYDROMORPHONE HCL 1 MG/ML IJ SOLN
INTRAMUSCULAR | Status: AC
  Filled 2020-01-22: qty 1

## 2020-01-22 MED ORDER — POTASSIUM CHLORIDE IN NACL 20-0.9 MEQ/L-% IV SOLN
INTRAVENOUS | Status: DC
  Filled 2020-01-22 (×4): qty 1000

## 2020-01-22 MED ORDER — FENTANYL CITRATE (PF) 250 MCG/5ML IJ SOLN
INTRAMUSCULAR | Status: AC
  Filled 2020-01-22: qty 5

## 2020-01-22 MED ORDER — CEFAZOLIN SODIUM-DEXTROSE 2-4 GM/100ML-% IV SOLN
INTRAVENOUS | Status: AC
  Filled 2020-01-22: qty 100

## 2020-01-22 MED ORDER — DEXAMETHASONE SODIUM PHOSPHATE 10 MG/ML IJ SOLN
INTRAMUSCULAR | Status: DC | PRN
  Administered 2020-01-22: 8 mg via INTRAVENOUS

## 2020-01-22 MED ORDER — ONDANSETRON HCL 4 MG/2ML IJ SOLN: 4.0000 mg | Freq: Four times a day (QID) | INTRAMUSCULAR | Status: DC | PRN

## 2020-01-22 MED ORDER — EPHEDRINE 5 MG/ML INJ
INTRAVENOUS | Status: AC
  Filled 2020-01-22: qty 10

## 2020-01-22 MED ORDER — 0.9 % SODIUM CHLORIDE (POUR BTL) OPTIME
TOPICAL | Status: DC | PRN
  Administered 2020-01-22: 1000 mL

## 2020-01-22 MED ORDER — ACETAMINOPHEN 325 MG PO TABS: 325.0000 mg | ORAL_TABLET | Freq: Four times a day (QID) | ORAL | Status: DC | PRN

## 2020-01-22 MED ORDER — MORPHINE SULFATE (PF) 4 MG/ML IV SOLN: 4.0000 mg | INTRAVENOUS | Status: DC | PRN

## 2020-01-22 MED ORDER — MIDAZOLAM HCL 2 MG/2ML IJ SOLN
INTRAMUSCULAR | Status: AC
  Filled 2020-01-22: qty 2

## 2020-01-22 MED ORDER — NITROGLYCERIN 0.4 MG SL SUBL: 0.4000 mg | SUBLINGUAL_TABLET | SUBLINGUAL | Status: DC | PRN

## 2020-01-22 MED ORDER — ONDANSETRON HCL 4 MG/2ML IJ SOLN
INTRAMUSCULAR | Status: DC | PRN
  Administered 2020-01-22: 4 mg via INTRAVENOUS

## 2020-01-22 SURGICAL SUPPLY — 38 items
ALCOHOL 70% 16 OZ (MISCELLANEOUS) ×1 IMPLANT
BIT DRILL FLUTED FEMUR 4.2/3 (BIT) ×2 IMPLANT
BNDG COHESIVE 6X5 TAN STRL LF (GAUZE/BANDAGES/DRESSINGS) ×6 IMPLANT
CANISTER SUCT 3000ML PPV (MISCELLANEOUS) ×1 IMPLANT
COVER PERINEAL POST (MISCELLANEOUS) ×3 IMPLANT
COVER SURGICAL LIGHT HANDLE (MISCELLANEOUS) ×3 IMPLANT
COVER WAND RF STERILE (DRAPES) ×3 IMPLANT
DRAPE HALF SHEET 40X57 (DRAPES) IMPLANT
DRAPE INCISE IOBAN 66X45 STRL (DRAPES) ×3 IMPLANT
DRAPE STERI IOBAN 125X83 (DRAPES) ×3 IMPLANT
DRSG ADAPTIC 3X8 NADH LF (GAUZE/BANDAGES/DRESSINGS) ×3 IMPLANT
DURAPREP 26ML APPLICATOR (WOUND CARE) ×3 IMPLANT
ELECT CAUTERY BLADE 6.4 (BLADE) ×3 IMPLANT
ELECT REM PT RETURN 9FT ADLT (ELECTROSURGICAL) ×3
ELECTRODE REM PT RTRN 9FT ADLT (ELECTROSURGICAL) ×1 IMPLANT
GAUZE SPONGE 4X4 12PLY STRL (GAUZE/BANDAGES/DRESSINGS) ×2 IMPLANT
GAUZE SPONGE 4X4 12PLY STRL LF (GAUZE/BANDAGES/DRESSINGS) ×3 IMPLANT
GLOVE BIO SURGEON STRL SZ7.5 (GLOVE) ×3 IMPLANT
GLOVE BIOGEL PI IND STRL 8 (GLOVE) ×1 IMPLANT
GLOVE BIOGEL PI INDICATOR 8 (GLOVE) ×2
GOWN STRL REUS W/ TWL LRG LVL3 (GOWN DISPOSABLE) ×1 IMPLANT
GOWN STRL REUS W/ TWL XL LVL3 (GOWN DISPOSABLE) ×1 IMPLANT
GOWN STRL REUS W/TWL LRG LVL3 (GOWN DISPOSABLE) ×3
GOWN STRL REUS W/TWL XL LVL3 (GOWN DISPOSABLE) ×3
GUIDEWIRE 3.2X400 (WIRE) ×4 IMPLANT
KIT BASIN OR (CUSTOM PROCEDURE TRAY) ×3 IMPLANT
KIT TURNOVER KIT B (KITS) ×3 IMPLANT
NAIL TROCH FIX 10X170 130 (Nail) ×2 IMPLANT
NS IRRIG 1000ML POUR BTL (IV SOLUTION) ×3 IMPLANT
PACK GENERAL/GYN (CUSTOM PROCEDURE TRAY) ×3 IMPLANT
PAD ARMBOARD 7.5X6 YLW CONV (MISCELLANEOUS) ×4 IMPLANT
SCREW FEMORAL NECK PERF 90 (Screw) ×2 IMPLANT
SCREW LOCKING 5.0X38MM (Screw) ×2 IMPLANT
STAPLER VISISTAT 35W (STAPLE) ×3 IMPLANT
SUT MON AB 2-0 CT1 36 (SUTURE) ×3 IMPLANT
TOWEL GREEN STERILE (TOWEL DISPOSABLE) ×3 IMPLANT
TOWEL GREEN STERILE FF (TOWEL DISPOSABLE) ×3 IMPLANT
WATER STERILE IRR 1000ML POUR (IV SOLUTION) ×3 IMPLANT

## 2020-01-22 NOTE — Consult Note (Addendum)
Anthony Coffey is an 64 y.o. male.    Chief Complaint: left hip/leg pain  HPI: 64 y/o male c/o left hip/leg pain s/p fall earlier today. Pt tripped and fell about 6 feet down some steps. C/o immediate pain and inability to put his weight on the left lower leg. Denies any other injuries s/p this fall. Denies any numbness or tingling distally. No previous hip issues in the past and ambulates normally without assistance.   PCP:  Pt, No Pcp Per (Inactive)  PMH: Past Medical History:  Diagnosis Date  . Aneurysm (HCC)   . Anxiety   . CAD (coronary artery disease)    02/01/17 Cath DES--> OM, staged PCI DES -->mLAD, EF 55%  . COPD (chronic obstructive pulmonary disease) (HCC)    smoker  . Hypertension    high with anxiety  . Myocardial infarction (HCC)     PSes:  No Known Allergies  Medications: No current facility-administered medications for this encounter.   Current Outpatient Medications  Medication Sig Dispense Refill  . acetaminophen (TYLENOL) 500 MG tablet Take 1 tablet (500 mg total) by mouth every 6 (six) hours as needed for headache (pain). 30 tablet 0  . atorvastatin (LIPITOR) 80 MG tablet Take 1 tablet (80 mg total) by mouth daily. 30 tablet 11  . clopidogrel (PLAVIX) 75 MG tablet Take 1 tablet (75 mg total) by mouth daily. 30 tablet 11  . fenofibrate 160 MG tablet Take 1 tablet (160 mg total) by mouth daily. 30 tablet 11  . lisinopril (ZESTRIL) 10 MG tablet Take 1 tablet (10 mg total) by mouth daily. 30 tablet 11  . metoprolol tartrate (LOPRESSOR) 25 MG tablet Take 1 tablet (25 mg total) by mouth 2 (two) times daily. 30 tablet 11  . nitroGLYCERIN (NITROSTAT) 0.4 MG SL tablet Place 1 tablet (0.4 mg total) under the tongue every 5 (five) minutes as needed for chest pain. 25 tablet 3  . Omega-3 Fatty Acids (FISH OIL) 1000 MG CAPS Take 4 capsules (4,000 mg total) by mouth daily.  0    Results for orders placed or performed during the hospital encounter of 01/21/20 (from the  past 48 hour(s))  CBC     Status: Abnormal   Collection Time: 01/22/20  1:11 AM  Result Value Ref Range   WBC 12.6 (H) 4.0 - 10.5 K/uL   RBC 4.66 4.22 - 5.81 MIL/uL   Hemoglobin 13.8 13.0 - 17.0 g/dL   HCT 38.1 01.7 - 51.0 %   MCV 89.3 80.0 - 100.0 fL   MCH 29.6 26.0 - 34.0 pg   MCHC 33.2 30.0 - 36.0 g/dL   RDW 25.8 52.7 - 78.2 %   Platelets 254 150 - 400 K/uL   nRBC 0.0 0.0 - 0.2 %    Comment: Performed at [facility], [address] [place], [place] [address]  Protime-INR     Status: None   Collection Time: 01/22/20  1:11 AM  Result Value Ref Range   Prothrombin Time 12.8 11.4 - 15.2 seconds   INR 1.0 0.8 - 1.2    Comment: (NOTE) INR goal varies based on device and disease states. Performed at [facility], [address], [place], [place] [address]    DG Pelvis 1-2 Views  Result Date: 01/21/2020 CLINICAL DATA:  Pain status post fall EXAM: PELVIS - 1-2 VIEW COMPARISON:  None. FINDINGS: There is no acute displaced fracture or dislocation. Mild-to-moderate degenerative changes are noted of both hips.  The osseous mineralization is within normal limits. Atherosclerotic changes are noted of the visualized vasculature. IMPRESSION: No acute displaced fracture or dislocation. Mild-to-moderate degenerative changes of both hips. Electronically Signed   By: Katherine Mantle M.D.   On: 01/21/2020 21:19   DG Knee 2 Views Left  Result Date: 01/21/2020 CLINICAL DATA:  Left knee pain after fall. EXAM: LEFT KNEE - 1-2 VIEW COMPARISON:  Femur radiograph earlier today. FINDINGS: No evidence of fracture, dislocation, or joint effusion. Mild patellofemoral spurring. Joint spaces are grossly preserved. Soft tissue edema anteriorly. There are vascular calcifications. IMPRESSION: Anterior soft tissue edema. No fracture or dislocation. Electronically Signed   By: Narda Rutherford M.D.   On: 01/21/2020 22:11   MR HIP LEFT WO CONTRAST  Result Date: 01/21/2020 CLINICAL DATA:  Hip pain after  fall EXAM: MR OF THE LEFT HIP WITHOUT CONTRAST TECHNIQUE: Multiplanar, multisequence MR imaging was performed. No intravenous contrast was administered. COMPARISON:  Radiograph same day FINDINGS: Bones: There is a incomplete nondisplaced fracture seen through the posterosuperior greater trochanter, best seen on series 5, image 22. Surrounding marrow edema seen within the greater trochanter and proximal femoral shaft. The femoral head is normal in shape, configuration and sphericity. No osseous bump at the femoral head neck junction. The visualized bony pelvis appears normal. The visualized sacroiliac joints and symphysis pubis appear normal. Articular cartilage and labrum Articular cartilage: Mild chondral thinning seen at the superior femoroacetabular joint. The ligamentum teres is intact Labrum: There is no gross labral tear or paralabral abnormality. Joint or bursal effusion Joint effusion: A small hip joint effusion is seen. Bursae: No focal periarticular fluid collection. Muscles and tendons Muscles and tendons: There is a partial insertional tear seen at the iliopsoas tendon on the lesser trochanter. There is feathery fluid signal seen surrounding the iliopsoas musculature. Mildly increased intrasubstance signal thickening seen within the hamstrings tendon insertion site. The gluteal tendons are intact. There is mild feathery signal seen within the gluteal musculature. Other findings Miscellaneous: The visualized internal pelvic contents appear unremarkable. IMPRESSION: 1. Nondisplaced incomplete fracture seen through the posterosuperior greater trochanter with surrounding marrow edema. 2. Partial insertional tear of the iliopsoas tendon with iliopsoas muscular strain/edema. 3. Mild gluteal musculature edema. 4. Insertional hamstrings tendinosis 5. Small femoroacetabular joint effusion 6. Mild femoroacetabular joint osteoarthritis.  Is bone which Electronically Signed   By: Jonna Clark M.D.   On: 01/21/2020  23:39   DG Chest Portable 1 View  Result Date: 01/22/2020 CLINICAL DATA:  Preoperative evaluation for left hip fracture. EXAM: PORTABLE CHEST 1 VIEW COMPARISON:  Prior radiograph from 06/09/2013. FINDINGS: Heart size within normal limits. Mediastinal silhouette normal. Aortic atherosclerosis. Lungs mildly hypoinflated with elevation of the right hemidiaphragm. No focal infiltrates. No pulmonary edema or visible pleural effusion. No pneumothorax. No acute osseous finding. IMPRESSION: No active cardiopulmonary disease. Electronically Signed   By: Rise Mu M.D.   On: 01/22/2020 01:17   DG FEMUR MIN 2 VIEWS LEFT  Result Date: 01/21/2020 CLINICAL DATA:  Pain status post fall EXAM: LEFT FEMUR 2 VIEWS COMPARISON:  None. FINDINGS: There is no acute displaced fracture or dislocation. Moderate degenerative changes are noted of left hip. There are vascular calcifications. IMPRESSION: Negative. Electronically Signed   By: Katherine Mantle M.D.   On: 01/21/2020 21:20    ROS: ROS Inability to bear weight on left lower extremity  Physical Exam: Alert and appropriate 64 y/o male in no acute distress Cervical spine with full rom and no tenderness Bilateral upper  extremities with full rom, no tenderness or deformity Moderate pain with log roll of leg lower extremity  Mild tenderness to left hemi pelvis on exam Right lower extremity: full rom without tenderness or deformity Left lower extremity: moderate pain to left leg/hip with ER and IR nv intact distally No rashes, edema or signs of open injury Physical Exam   Assessment/Plan Assessment: left femoral neck fracture  Plan: Patient had MRI showing non displaced femoral neck fracture on tension side at basicervical junction - high risk for displacement Recommend admission and plan for surgical repair later today Keep NPO Strict non weight bearing left lower extremity Pain management  [facility] [place], [place] is  now [facility]  [place] [address] [place], [place], [place] [address] Phone: [phone] [email or web address] Facebook  [employer]

## 2020-01-22 NOTE — Transfer of Care (Signed)
Immediate Anesthesia Transfer of Care Note  Patient: Anthony Coffey  Procedure(s) Performed: INTRAMEDULLARY (IM) NAIL INTERTROCHANTRIC (Left )  Patient Location: PACU  Anesthesia Type:General  Level of Consciousness: awake, patient cooperative and responds to stimulation  Airway & Oxygen Therapy: Patient Spontanous Breathing and Patient connected to nasal cannula oxygen  Post-op Assessment: Report given to RN and Post -op Vital signs reviewed and stable  Post vital signs: Reviewed and stable  Last Vitals:  Vitals Value Taken Time  BP 126/70 01/22/20 1220  Temp 36.1 C 01/22/20 1220  Pulse 86 01/22/20 1220  Resp 17 01/22/20 1220  SpO2 92 % 01/22/20 1220  Vitals shown include unvalidated device data.  Last Pain:  Vitals:   01/22/20 0905  TempSrc:   PainSc: 8       Patients Stated Pain Goal: 3 (01/22/20 0905)  Complications: No apparent anesthesia complications

## 2020-01-22 NOTE — Progress Notes (Signed)
PROGRESS NOTE    Anthony Coffey  TDV:[id number] DOB: 1956-01-08 DOA: 01/21/2020 PCP: Pt, No Pcp Per (Inactive)    Brief Narrative:  63yo with hx CAD, HTN presented with L hip fracture after recent mechanical fall. Orthopedic Surgery following and pt now s/p surgery on 3/27  Assessment & Plan:   Principal Problem:   Closed left hip fracture, initial encounter [facility]) Active Problems:   Coronary artery disease involving native coronary artery of native heart with angina pectoris (HCC)   Tobacco abuse   Essential hypertension   Acute urinary retention  Principal Problem:   Closed left hip fracture, initial encounter [facility])   Orthopedic surgery consulted and pt is now s/p surgery 3/27  -Anticipate PT/OT eval  Continue analgesic as needed per Orthopedic Surgery  Active Problems:   Coronary artery disease involving native coronary artery of native heart with angina pectoris (HCC)   Remains chest pain-free  Plan to resume Plavix on 3/28 if OK with Orthopedics  Continue home regimen of statin therapy, ACE inhibitor and AV nodal blocking agents    Essential hypertension   BP currently stable  Continue home regimen of antihypertensive therapy    Tobacco abuse   Counseling patient on cessation  Admitting physician offered patient nicotine replacement therapy while hospitalized  Acute Urinary Retention   Patient noted to have difficulty urinating shortly after arrival to the emergency department  Scan revealing greater than 600 cc of urine  Patient likely experiencing transient urinary retention in the setting of several doses of opiate-based analgesics  Foley catheter placed at time of admit  Will plan voiding trial post-operatively  DVT prophylaxis: Lovenox subQ Code Status: Full Family Communication: Pt in room, family not at bedside Disposition Plan: From home, d/c planning pending per PT/OT. Would ultimately d/c when OK with Ortho  Consultants:     Orthopedic Surgery  Procedures:   L hip surgery 3/27  Antimicrobials: Anti-infectives (From admission, onward)   Start     Dose/Rate Route Frequency Ordered Stop   01/22/20 1400  ceFAZolin (ANCEF) IVPB 1 g/50 mL premix     1 g 100 mL/hr over 30 Minutes Intravenous Every 6 hours 01/22/20 1253 01/23/20 0759   01/22/20 1100  ceFAZolin (ANCEF) IVPB 2g/100 mL premix     2 g 200 mL/hr over 30 Minutes Intravenous  Once 01/22/20 1052 01/22/20 1155   01/22/20 1050  ceFAZolin (ANCEF) 2-4 GM/100ML-% IVPB    Note to Pharmacy: Shireen Quan   : cabinet override      01/22/20 1050 01/22/20 1133       Subjective: Seen prior to surgery, feeling anxious about surgery this AM  Objective: Vitals:   01/22/20 0848 01/22/20 1220 01/22/20 1235 01/22/20 1300  BP: 123/75 126/70 (!) 119/58 123/63  Pulse: 63 87 80 79  Resp: 14 15 16 15   Temp: 98.2 F (36.8 C) (!) 97 F (36.1 C) (!) 97.3 F (36.3 C) 98.9 F (37.2 C)  TempSrc: Oral     SpO2: 97% 93% 93% 94%    Intake/Output Summary (Last 24 hours) at 01/22/2020 1604 Last data filed at 01/22/2020 1202 Gross per 24 hour  Intake 800 ml  Output 1800 ml  Net -1000 ml   There were no vitals filed for this visit.  Examination:  General exam: Appears calm and comfortable  Respiratory system: Clear to auscultation. Respiratory effort normal. Cardiovascular system: S1 & S2 heard, Regular Gastrointestinal system: Abdomen is nondistended, soft and nontender. No organomegaly or masses felt.  Normal bowel sounds heard. Central nervous system: Alert and oriented. No focal neurological deficits. Extremities: Symmetric 5 x 5 power. Skin: No rashes, lesions Psychiatry: Judgement and insight appear normal. Mood & affect appropriate.   Data Reviewed: I have personally reviewed following labs and imaging studies  CBC: Recent Labs  Lab 01/22/20 0111 01/22/20 1029  WBC 12.6* 9.1  HGB 13.8 14.3  HCT 41.6 41.7  MCV 89.3 88.9  PLT 254 247   Basic  Metabolic Panel: Recent Labs  Lab 01/22/20 0111 01/22/20 1029  NA 135  --   K 3.8  --   CL 104  --   CO2 21*  --   GLUCOSE 97  --   BUN 10  --   CREATININE 0.86 0.69  CALCIUM 9.0  --    GFR: CrCl cannot be calculated (Unknown ideal weight.). Liver Function Tests: No results for input(s): AST, ALT, ALKPHOS, BILITOT, PROT, ALBUMIN in the last 168 hours. No results for input(s): LIPASE, AMYLASE in the last 168 hours. No results for input(s): AMMONIA in the last 168 hours. Coagulation Profile: Recent Labs  Lab 01/22/20 0111  INR 1.0   Cardiac Enzymes: No results for input(s): CKTOTAL, CKMB, CKMBINDEX, TROPONINI in the last 168 hours. BNP (last 3 results) No results for input(s): PROBNP in the last 8760 hours. HbA1C: No results for input(s): HGBA1C in the last 72 hours. CBG: No results for input(s): GLUCAP in the last 168 hours. Lipid Profile: No results for input(s): CHOL, HDL, LDLCALC, TRIG, CHOLHDL, LDLDIRECT in the last 72 hours. Thyroid Function Tests: No results for input(s): TSH, T4TOTAL, FREET4, T3FREE, THYROIDAB in the last 72 hours. Anemia Panel: No results for input(s): VITAMINB12, FOLATE, FERRITIN, TIBC, IRON, RETICCTPCT in the last 72 hours. Sepsis Labs: No results for input(s): PROCALCITON, LATICACIDVEN in the last 168 hours.  Recent Results (from the past 240 hour(s))  SARS CORONAVIRUS 2 (TAT 6-24 HRS) Nasopharyngeal Nasopharyngeal Swab     Status: None   Collection Time: 01/22/20  1:05 AM   Specimen: Nasopharyngeal Swab  Result Value Ref Range Status   SARS Coronavirus 2 NEGATIVE NEGATIVE Final    Comment: (NOTE) SARS-CoV-2 target nucleic acids are NOT DETECTED. The SARS-CoV-2 RNA is generally detectable in upper and lower respiratory specimens during the acute phase of infection. Negative results do not preclude SARS-CoV-2 infection, do not rule out co-infections with other pathogens, and should not be used as the sole basis for treatment or other  patient management decisions. Negative results must be combined with clinical observations, patient history, and epidemiological information. The expected result is Negative. Fact Sheet for Patients: HairSlick.no Fact Sheet for Healthcare Providers: quierodirigir.com This test is not yet approved or cleared by the [place] FDA and  has been authorized for detection and/or diagnosis of SARS-CoV-2 by FDA under an Emergency Use Authorization (EUA). This EUA will remain  in effect (meaning this test can be used) for the duration of the COVID-19 declaration under Section 56 4(b)(1) of the Act, 21 U.S.C. section 360bbb-3(b)(1), unless the authorization is terminated or revoked sooner. Performed at [facility], [address], [place], [place] [address]   Surgical pcr screen     Status: None   Collection Time: 01/22/20  9:07 AM   Specimen: Nasal Mucosa; Nasal Swab  Result Value Ref Range Status   MRSA, PCR NEGATIVE NEGATIVE Final   Staphylococcus aureus NEGATIVE NEGATIVE Final    Comment: (NOTE) The Xpert SA Assay (FDA approved for NASAL specimens  in patients 1 years of age and older), is one component of a comprehensive surveillance program. It is not intended to diagnose infection nor to guide or monitor treatment. Performed at [facility], [address], [place], [place] [address]      Radiology Studies: DG Pelvis 1-2 Views  Result Date: 01/21/2020 CLINICAL DATA:  Pain status post fall EXAM: PELVIS - 1-2 VIEW COMPARISON:  None. FINDINGS: There is no acute displaced fracture or dislocation. Mild-to-moderate degenerative changes are noted of both hips. The osseous mineralization is within normal limits. Atherosclerotic changes are noted of the visualized vasculature. IMPRESSION: No acute displaced fracture or dislocation. Mild-to-moderate degenerative changes of both hips. Electronically Signed   By: Katherine Mantle M.D.   On: 01/21/2020 21:19   DG Knee 2 Views Left  Result Date: 01/21/2020 CLINICAL DATA:  Left knee pain after fall. EXAM: LEFT KNEE - 1-2 VIEW COMPARISON:  Femur radiograph earlier today. FINDINGS: No evidence of fracture, dislocation, or joint effusion. Mild patellofemoral spurring. Joint spaces are grossly preserved. Soft tissue edema anteriorly. There are vascular calcifications. IMPRESSION: Anterior soft tissue edema. No fracture or dislocation. Electronically Signed   By: Narda Rutherford M.D.   On: 01/21/2020 22:11   MR HIP LEFT WO CONTRAST  Result Date: 01/21/2020 CLINICAL DATA:  Hip pain after fall EXAM: MR OF THE LEFT HIP WITHOUT CONTRAST TECHNIQUE: Multiplanar, multisequence MR imaging was performed. No intravenous contrast was administered. COMPARISON:  Radiograph same day FINDINGS: Bones: There is a incomplete nondisplaced fracture seen through the posterosuperior greater trochanter, best seen on series 5, image 22. Surrounding marrow edema seen within the greater trochanter and proximal femoral shaft. The femoral head is normal in shape, configuration and sphericity. No osseous bump at the femoral head neck junction. The visualized bony pelvis appears normal. The visualized sacroiliac joints and symphysis pubis appear normal. Articular cartilage and labrum Articular cartilage: Mild chondral thinning seen at the superior femoroacetabular joint. The ligamentum teres is intact Labrum: There is no gross labral tear or paralabral abnormality. Joint or bursal effusion Joint effusion: A small hip joint effusion is seen. Bursae: No focal periarticular fluid collection. Muscles and tendons Muscles and tendons: There is a partial insertional tear seen at the iliopsoas tendon on the lesser trochanter. There is feathery fluid signal seen surrounding the iliopsoas musculature. Mildly increased intrasubstance signal thickening seen within the hamstrings tendon insertion site. The gluteal tendons are  intact. There is mild feathery signal seen within the gluteal musculature. Other findings Miscellaneous: The visualized internal pelvic contents appear unremarkable. IMPRESSION: 1. Nondisplaced incomplete fracture seen through the posterosuperior greater trochanter with surrounding marrow edema. 2. Partial insertional tear of the iliopsoas tendon with iliopsoas muscular strain/edema. 3. Mild gluteal musculature edema. 4. Insertional hamstrings tendinosis 5. Small femoroacetabular joint effusion 6. Mild femoroacetabular joint osteoarthritis.  Is bone which Electronically Signed   By: Jonna Clark M.D.   On: 01/21/2020 23:39   DG Chest Portable 1 View  Result Date: 01/22/2020 CLINICAL DATA:  Preoperative evaluation for left hip fracture. EXAM: PORTABLE CHEST 1 VIEW COMPARISON:  Prior radiograph from 06/09/2013. FINDINGS: Heart size within normal limits. Mediastinal silhouette normal. Aortic atherosclerosis. Lungs mildly hypoinflated with elevation of the right hemidiaphragm. No focal infiltrates. No pulmonary edema or visible pleural effusion. No pneumothorax. No acute osseous finding. IMPRESSION: No active cardiopulmonary disease. Electronically Signed   By: Rise Mu M.D.   On: 01/22/2020 01:17   DG C-Arm 1-60 Min  Result Date: 01/22/2020 CLINICAL  DATA:  Fracture post fall EXAM: DG C-ARM 1-60 MIN; OPERATIVE LEFT HIP WITH PELVIS CONTRAST:  None FLUOROSCOPY TIME:  See operative report COMPARISON:  MR from previous day FINDINGS: Changes of IM rod and sliding screw fixation across the femoral neck. A single distal interlocking pin is noted. IMPRESSION: Orthopedic fixation of  proximal femur fracture INDICATION: Fracture post fall Electronically Signed   By: Lucrezia Europe M.D.   On: 01/22/2020 13:59   DG HIP OPERATIVE UNILAT W OR W/O PELVIS LEFT  Result Date: 01/22/2020 CLINICAL DATA:  Fracture post fall EXAM: DG C-ARM 1-60 MIN; OPERATIVE LEFT HIP WITH PELVIS CONTRAST:  None FLUOROSCOPY TIME:  See  operative report COMPARISON:  MR from previous day FINDINGS: Changes of IM rod and sliding screw fixation across the femoral neck. A single distal interlocking pin is noted. IMPRESSION: Orthopedic fixation of  proximal femur fracture INDICATION: Fracture post fall Electronically Signed   By: Lucrezia Europe M.D.   On: 01/22/2020 13:59   DG FEMUR MIN 2 VIEWS LEFT  Result Date: 01/21/2020 CLINICAL DATA:  Pain status post fall EXAM: LEFT FEMUR 2 VIEWS COMPARISON:  None. FINDINGS: There is no acute displaced fracture or dislocation. Moderate degenerative changes are noted of left hip. There are vascular calcifications. IMPRESSION: Negative. Electronically Signed   By: Constance Holster M.D.   On: 01/21/2020 21:20    Scheduled Meds: . acetaminophen  500 mg Oral Q6H  . atorvastatin  80 mg Oral Daily  . Chlorhexidine Gluconate Cloth  6 each Topical Daily  . [START ON 01/23/2020] clopidogrel  75 mg Oral Daily  . docusate sodium  100 mg Oral BID  . [START ON 01/23/2020] enoxaparin (LOVENOX) injection  40 mg Subcutaneous Q24H  . [START ON 01/23/2020] feeding supplement (ENSURE ENLIVE)  237 mL Oral QHS  . fenofibrate  160 mg Oral Daily  . HYDROmorphone      . lisinopril  10 mg Oral Daily  . metoprolol tartrate  25 mg Oral BID  . [START ON 01/23/2020] multivitamin with minerals  1 tablet Oral Daily  . nicotine  14 mg Transdermal Daily   Continuous Infusions: . 0.9 % NaCl with KCl 20 mEq / L 125 mL/hr at 01/22/20 1313  .  ceFAZolin (ANCEF) IV    . lactated ringers 10 mL/hr at 01/22/20 1110     LOS: 0 days   Marylu Lund, MD [facility] Pager On Amion  If 7PM-7AM, please contact night-coverage 01/22/2020, 4:04 PM

## 2020-01-22 NOTE — Op Note (Signed)
Date of Surgery: 01/22/2020  INDICATIONS: Anthony Coffey is a 64 y.o.-year-old male who sustained a left hip fracture. The risks and benefits of the procedure discussed with the patient prior to the procedure and all questions were answered; consent was obtained.  PREOPERATIVE DIAGNOSIS: left hip basicervical fracture   POSTOPERATIVE DIAGNOSIS: Same   PROCEDURE: Treatment of intertrochanteric, pertrochanteric, subtrochanteric fracture with intramedullary implant. CPT [phone]   SURGEON: Dannielle Karvonen. Stann Mainland, M.D.   ANESTHESIA: general   IV FLUIDS AND URINE: See anesthesia record   ESTIMATED BLOOD LOSS: 50 cc  IMPLANTS:  Synthes TFNA 10 x 185  90 mm compression screw 38 x 5 mm distal interlocking screw  DRAINS: None.   COMPLICATIONS: None.   DESCRIPTION OF PROCEDURE: The patient was brought to the operating room and placed supine on the operating table. The patient's leg had been signed prior to the procedure. The patient had the anesthesia placed by the anesthesiologist. The prep verification and incision time-outs were performed to confirm that this was the correct patient, site, side and location. The patient had an SCD on the opposite lower extremity. The patient did receive antibiotics prior to the incision and was re-dosed during the procedure as needed at indicated intervals. The patient was positioned on the fracture table with the table in traction and internal rotation to reduce the hip. The well leg was placed in a scissor position and all bony prominences were well-padded. The patient had the lower extremity prepped and draped in the standard surgical fashion. The incision was made 4 finger breadths superior to the greater trochanter. A guide pin was inserted into the tip of the greater trochanter under fluoroscopic guidance. An opening reamer was used to gain access to the femoral canal. The nail length was measured and inserted down the femoral canal to its proper depth. The  appropriate version of insertion for the lag screw was found under fluoroscopy. A pin was inserted up the femoral neck through the jig. The length of the lag screw was then measured. The lag screw was inserted as near to center-center in the head as possible. The leg was taken out of traction, then the compression screw was used to compress across the fracture. Compression was visualized on serial xrays.   We next turned our attention to the distal interlocking screw.  This was placed through the drill guide of the nail inserter.  A small incision was made overlying the lateral thigh at the screw site, and a tonsil was used to disect down to bone.  A drill pass was made through the jig and across the nail through both cortices.  This was measured, and the appropriate screw was placed under hand power and found to have good bite.    The wound was copiously irrigated with saline and the subcutaneous layer closed with 2.0 monocryl and the skin was reapproximated with staples. The wounds were cleaned and dried a final time and a sterile dressing was placed. The hip was taken through a range of motion at the end of the case under fluoroscopic imaging to visualize the approach-withdraw phenomenon and confirm implant length in the head. The patient was then awakened from anesthesia and taken to the recovery room in stable condition. All counts were correct at the end of the case.   POSTOPERATIVE PLAN: The patient will be weight bearing as tolerated and will return in 2 weeks for staple removal and the patient will receive DVT prophylaxis based on other medications, activity  level, and risk ratio of bleeding to thrombosis.     Anthony Rued, MD [facility] [phone] 12:14 PM

## 2020-01-22 NOTE — ED Notes (Signed)
Pt resting in bed. Pt denies new or worsening complaints. Will continue to monitor. No distress noted. Pt on continuous monitoring via blood pressure, pulse ox, and cardiac monitor.  

## 2020-01-22 NOTE — Anesthesia Preprocedure Evaluation (Signed)
Anesthesia Evaluation  Patient identified by MRN, date of birth, ID band Patient awake    Reviewed: Allergy & Precautions, H&P , NPO status , Patient's Chart, lab work & pertinent test results, reviewed documented beta blocker date and time   Airway Mallampati: II  TM Distance: >3 FB Neck ROM: Full    Dental no notable dental hx. (+) Poor Dentition, Dental Advisory Given   Pulmonary COPD, Current Smoker,    Pulmonary exam normal breath sounds clear to auscultation       Cardiovascular hypertension, Pt. on home beta blockers and Pt. on medications + angina + CAD, + Past MI and + Cardiac Stents   Rhythm:Regular Rate:Normal     Neuro/Psych negative neurological ROS  negative psych ROS   GI/Hepatic negative GI ROS, Neg liver ROS,   Endo/Other  negative endocrine ROS  Renal/GU negative Renal ROS  negative genitourinary   Musculoskeletal   Abdominal   Peds  Hematology negative hematology ROS (+)   Anesthesia Other Findings   Reproductive/Obstetrics negative OB ROS                             Anesthesia Physical  Anesthesia Plan  ASA: III  Anesthesia Plan: General   Post-op Pain Management:    Induction: Intravenous  PONV Risk Score and Plan: 2 and Ondansetron, Dexamethasone and Treatment may vary due to age or medical condition  Airway Management Planned: Oral ETT  Additional Equipment:   Intra-op Plan:   Post-operative Plan: Extubation in OR  Informed Consent: I have reviewed the patients History and Physical, chart, labs and discussed the procedure including the risks, benefits and alternatives for the proposed anesthesia with the patient or authorized representative who has indicated his/her understanding and acceptance.     Dental advisory given  Plan Discussed with: CRNA  Anesthesia Plan Comments:         Anesthesia Quick Evaluation

## 2020-01-22 NOTE — Brief Op Note (Signed)
01/22/2020  12:15 PM  PATIENT:  [name]  64 y.o. male  PRE-OPERATIVE DIAGNOSIS:  Left hip fracture  POST-OPERATIVE DIAGNOSIS:  Left hip fracture  PROCEDURE:  Procedure(s): INTRAMEDULLARY (IM) NAIL INTERTROCHANTRIC (Left)  SURGEON:  Surgeon(s) and Role:    * Yolonda Kida, MD - Primary  PHYSICIAN ASSISTANT:   ASSISTANTS: none   ANESTHESIA:   general  EBL:  50 mL   BLOOD ADMINISTERED:none  DRAINS: none   LOCAL MEDICATIONS USED:  NONE  SPECIMEN:  No Specimen  DISPOSITION OF SPECIMEN:  N/A  COUNTS:  YES  TOURNIQUET:  * No tourniquets in log *  DICTATION: .Note written in EPIC  PLAN OF CARE: Admit to inpatient   PATIENT DISPOSITION:  PACU - hemodynamically stable.   Delay start of Pharmacological VTE agent (>24hrs) due to surgical blood loss or risk of bleeding: not applicable

## 2020-01-22 NOTE — H&P (Signed)
H&P update  The surgical history has been reviewed and remains accurate without interval change.  The patient was re-examined and patient's physiologic condition has not changed significantly in the last 30 days. The condition still exists that makes this procedure necessary. The treatment plan remains the same, without new options for care.  No new pharmacological allergies or types of therapy has been initiated that would change the plan or the appropriateness of the plan.  The patient and/or family understand the potential benefits and risks.  Romie Keeble P. Aundria Rud, MD 01/22/2020 10:47 AM

## 2020-01-22 NOTE — Progress Notes (Signed)
Initial Nutrition Assessment  DOCUMENTATION CODES:   Not applicable  INTERVENTION:   Once diet is advanced, add:  -Ensure Enlive po daily, each supplement provides 350 kcal and 20 grams of protein -MVI with minerals daily  NUTRITION DIAGNOSIS:   Increased nutrient needs related to post-op healing as evidenced by estimated needs.  GOAL:   Patient will meet greater than or equal to 90% of their needs  MONITOR:   PO intake, Supplement acceptance, Diet advancement, Labs, Weight trends, Skin, I & O's  REASON FOR ASSESSMENT:   Consult Hip fracture protocol  ASSESSMENT:   64 y/o male c/o left hip/leg pain s/p fall earlier today. Pt tripped and fell about 6 feet down some steps. C/o immediate pain and inability to put his weight on the left lower leg. Denies any other injuries s/p this fall. Denies any numbness or tingling distally. No previous hip issues in the past and ambulates normally without assistance.  Pt admitted with lt femoral neck fracture.   Reviewed I/O's: -1 L x 24 hours   UOP: 1.8 L x 24 hours  Per orthopedics notes, plan for IM nailing today.   Attempted to speak with pt via phone, however, no answer. On second attempt, pt in OR at time of visit.   No recent wt available to assess. Per review of wt records, pt previously maintaining his weight since last recorded wt reading (09/28/19).   Medications reviewed and include 0.9% NaCl with KCl 20 mEq/L infusion @ 125 ml/hr and lactated ringers infusion @ 10 ml/hr.   Labs reviewed.   Diet Order:   Diet Order            Diet NPO time specified  Diet effective now              EDUCATION NEEDS:   No education needs have been identified at this time  Skin:  Skin Assessment: Skin Integrity Issues: Skin Integrity Issues:: Incisions Incisions: closed lt hip  Last BM:  Unknown  Height:   Ht Readings from Last 1 Encounters:  09/28/19 5\' 9"  (1.753 m)    Weight:   Wt Readings from Last 1  Encounters:  09/28/19 84.6 kg    Ideal Body Weight:  72.7 kg  BMI:  There is no height or weight on file to calculate BMI.  Estimated Nutritional Needs:   Kcal:  1900-2100  Protein:  95-110 grams  Fluid:  > 1.9 L    14/01/20, RD, LDN, CDCES Registered Dietitian II Certified Diabetes Care and Education Specialist Please refer to [facility] for RD and/or RD on-call/weekend/after hours pager

## 2020-01-22 NOTE — H&P (Addendum)
History and Physical    Anthony Coffey:[id number] DOB: 05-10-1956 DOA: 01/21/2020  PCP: Pt, No Pcp Per (Inactive)  Patient coming from: Home   Chief Complaint:  Chief Complaint  Patient presents with  . Fall     HPI:    64 year old male with past medical history of coronary artery disease, hypertension, nicotine dependence who presents to [facility] emergency department with complaints of left hip pain.  Patient explains that earlier in the day he was walking through his shed when he suddenly tripped and fell, resulting in sudden onset of left hip pain.  Patient describes the pain is severe in intensity, sharp in quality, nonradiating and worse with any attempt to move the left lower extremity or weight-bear.  Patient denies any abdominal pain, diarrhea, fever or previous injury.  Regarding the fall itself, patient denies any associated lightheadedness, focal weakness, palpitations or loss of consciousness.  Patient's pain continued to persist for the next several hours to the patient eventually presented to [facility] emergency department for evaluation.  Upon evaluation in the emergency department patient initially underwent left hip x-ray which was negative for fracture.  Due to patient's continued severe left hip pain and inability to weight-bear MRI of the hip was performed revealing nondisplaced incomplete fracture of the posterior superior greater trochanter with surrounding marrow edema.  Dr. Lyla Glassing with orthopedic surgery was consulted and recommended admission and plan for surgical repair later today.  The hospitalist group has now been called to assess the patient for admission to the hospital.    Review of Systems: A 10-system review of systems has been performed and all systems are negative with the exception of what is listed in the HPI.   Past Medical History:  Diagnosis Date  . Aneurysm ([place])   . Anxiety   . CAD (coronary artery disease)    02/01/17 Cath DES-->  OM, staged PCI DES -->mLAD, EF 55%  . COPD (chronic obstructive pulmonary disease) ([place])    smoker  . Hypertension    high with anxiety  . Myocardial infarction [facility])     Past Surgical History:  Procedure Laterality Date  . ABDOMINAL AORTIC ANEURYSM REPAIR    . CORONARY STENT INTERVENTION N/A 02/01/2017   Procedure: Coronary Stent Intervention;  Surgeon: Belva Crome, MD;  Location: [place] [facility];  Service: Cardiovascular;  Laterality: N/A;  . CORONARY STENT INTERVENTION N/A 02/03/2017   Procedure: Coronary Stent Intervention;  Surgeon: Sherren Mocha, MD;  Location: [place] [facility];  Service: Cardiovascular;  Laterality: N/A;  . EAR CYST EXCISION Right 06/14/2013   Procedure: CYST REMOVAL RIGHT MANDIBLE;  Surgeon: Gae Bon, DDS;  Location: [place];  Service: Oral Surgery;  Laterality: Right;  . LEFT HEART CATH AND CORONARY ANGIOGRAPHY N/A 02/01/2017   Procedure: Left Heart Cath and Coronary Angiography;  Surgeon: Belva Crome, MD;  Location: [place] [facility];  Service: Cardiovascular;  Laterality: N/A;  . TONSILLECTOMY    . TOOTH EXTRACTION N/A 06/14/2013   Procedure: EXTRACTIONS 17, 23, 26, 27, 29, 32;  Surgeon: Gae Bon, DDS;  Location: [place];  Service: Oral Surgery;  Laterality: N/A;  . VASCULAR SURGERY       reports that he has been smoking cigarettes. He has a 39.00 pack-year smoking history. He has quit using smokeless tobacco. He reports that he does not drink alcohol or use drugs.  No Known Allergies  Family History  Problem Relation Age of Onset  . Anxiety  disorder Brother      Prior to Admission medications   Medication Sig Start Date End Date Taking? Authorizing Provider  acetaminophen (TYLENOL) 500 MG tablet Take 1 tablet (500 mg total) by mouth every 6 (six) hours as needed for headache (pain). 02/04/17   Cheryln Manly, NP  atorvastatin (LIPITOR) 80 MG tablet Take 1 tablet (80 mg total) by mouth daily. 11/16/19 12/16/19  Belva Crome, MD    clopidogrel (PLAVIX) 75 MG tablet Take 1 tablet (75 mg total) by mouth daily. 07/07/18   Belva Crome, MD  fenofibrate 160 MG tablet Take 1 tablet (160 mg total) by mouth daily. 11/16/19   Belva Crome, MD  lisinopril (ZESTRIL) 10 MG tablet Take 1 tablet (10 mg total) by mouth daily. 09/28/19   Sande Rives E, PA-C  metoprolol tartrate (LOPRESSOR) 25 MG tablet Take 1 tablet (25 mg total) by mouth 2 (two) times daily. 09/28/19   Sande Rives E, PA-C  nitroGLYCERIN (NITROSTAT) 0.4 MG SL tablet Place 1 tablet (0.4 mg total) under the tongue every 5 (five) minutes as needed for chest pain. 01/11/20   Belva Crome, MD  Omega-3 Fatty Acids (FISH OIL) 1000 MG CAPS Take 4 capsules (4,000 mg total) by mouth daily. 07/03/17   Belva Crome, MD    Physical Exam: Vitals:   01/21/20 1959 01/21/20 2004  BP: (!) 146/81 137/84  Pulse: 71 72  Resp: 17 14  Temp:  99 F (37.2 C)  TempSrc:  Oral  SpO2: 96% 98%    Constitutional: Acute alert and oriented x3, patient is in distress due to left hip pain. Skin: no rashes, no lesions, good skin turgor noted. Eyes: Pupils are equally reactive to light.  No evidence of scleral icterus or conjunctival pallor.  ENMT: Mucous membranes are moist. Posterior pharynx clear of any exudate or lesions. Normal dentition.   Neck: normal, supple, no masses, no thyromegaly Respiratory: clear to auscultation bilaterally, no wheezing no org, no crackles. Normal respiratory effort. No accessory muscle use.  Cardiovascular: Regular rate and rhythm, no murmurs / rubs / gallops. No extremity edema. 2+ pedal pulses. No carotid bruits.  Back:   Nontender without crepitus or deformity. Abdomen: Abdomen is soft and nontender.  No evidence of intra-abdominal masses.  Positive bowel sounds noted in all quadrants.   Musculoskeletal: Severe pain of the left hip with both passive and active range of motion.  No obvious deformities noted.  No evidence of edema. no contractures.  Normal muscle tone.  Neurologic: CN 2-12 grossly intact. Sensation intact, unable to assess strength of the left lower extremity due to pain, otherwise strength is 5 out of 5 all other extremities.  Patient is following all commands.  Patient is responsive to verbal stimuli.   Psychiatric: Patient presents as a normal mood with appropriate affect.  Patient seems to possess insight as to theircurrent situation.     Labs on Admission: I have personally reviewed following labs and imaging studies -   CBC: Recent Labs  Lab 01/22/20 0111  WBC 12.6*  HGB 13.8  HCT 41.6  MCV 89.3  PLT 889   Basic Metabolic Panel: Recent Labs  Lab 01/22/20 0111  NA 135  K 3.8  CL 104  CO2 21*  GLUCOSE 97  BUN 10  CREATININE 0.86  CALCIUM 9.0   GFR: CrCl cannot be calculated (Unknown ideal weight.). Liver Function Tests: No results for input(s): AST, ALT, ALKPHOS, BILITOT, PROT, ALBUMIN in the last  168 hours. No results for input(s): LIPASE, AMYLASE in the last 168 hours. No results for input(s): AMMONIA in the last 168 hours. Coagulation Profile: Recent Labs  Lab 01/22/20 0111  INR 1.0   Cardiac Enzymes: No results for input(s): CKTOTAL, CKMB, CKMBINDEX, TROPONINI in the last 168 hours. BNP (last 3 results) No results for input(s): PROBNP in the last 8760 hours. HbA1C: No results for input(s): HGBA1C in the last 72 hours. CBG: No results for input(s): GLUCAP in the last 168 hours. Lipid Profile: No results for input(s): CHOL, HDL, LDLCALC, TRIG, CHOLHDL, LDLDIRECT in the last 72 hours. Thyroid Function Tests: No results for input(s): TSH, T4TOTAL, FREET4, T3FREE, THYROIDAB in the last 72 hours. Anemia Panel: No results for input(s): VITAMINB12, FOLATE, FERRITIN, TIBC, IRON, RETICCTPCT in the last 72 hours. Urine analysis: No results found for: COLORURINE, APPEARANCEUR, LABSPEC, PHURINE, GLUCOSEU, HGBUR, BILIRUBINUR, KETONESUR, PROTEINUR, UROBILINOGEN, NITRITE,  LEUKOCYTESUR  Radiological Exams on Admission: DG Pelvis 1-2 Views  Result Date: 01/21/2020 CLINICAL DATA:  Pain status post fall EXAM: PELVIS - 1-2 VIEW COMPARISON:  None. FINDINGS: There is no acute displaced fracture or dislocation. Mild-to-moderate degenerative changes are noted of both hips. The osseous mineralization is within normal limits. Atherosclerotic changes are noted of the visualized vasculature. IMPRESSION: No acute displaced fracture or dislocation. Mild-to-moderate degenerative changes of both hips. Electronically Signed   By: Constance Holster M.D.   On: 01/21/2020 21:19   DG Knee 2 Views Left  Result Date: 01/21/2020 CLINICAL DATA:  Left knee pain after fall. EXAM: LEFT KNEE - 1-2 VIEW COMPARISON:  Femur radiograph earlier today. FINDINGS: No evidence of fracture, dislocation, or joint effusion. Mild patellofemoral spurring. Joint spaces are grossly preserved. Soft tissue edema anteriorly. There are vascular calcifications. IMPRESSION: Anterior soft tissue edema. No fracture or dislocation. Electronically Signed   By: Keith Rake M.D.   On: 01/21/2020 22:11   MR HIP LEFT WO CONTRAST  Result Date: 01/21/2020 CLINICAL DATA:  Hip pain after fall EXAM: MR OF THE LEFT HIP WITHOUT CONTRAST TECHNIQUE: Multiplanar, multisequence MR imaging was performed. No intravenous contrast was administered. COMPARISON:  Radiograph same day FINDINGS: Bones: There is a incomplete nondisplaced fracture seen through the posterosuperior greater trochanter, best seen on series 5, image 22. Surrounding marrow edema seen within the greater trochanter and proximal femoral shaft. The femoral head is normal in shape, configuration and sphericity. No osseous bump at the femoral head neck junction. The visualized bony pelvis appears normal. The visualized sacroiliac joints and symphysis pubis appear normal. Articular cartilage and labrum Articular cartilage: Mild chondral thinning seen at the superior  femoroacetabular joint. The ligamentum teres is intact Labrum: There is no gross labral tear or paralabral abnormality. Joint or bursal effusion Joint effusion: A small hip joint effusion is seen. Bursae: No focal periarticular fluid collection. Muscles and tendons Muscles and tendons: There is a partial insertional tear seen at the iliopsoas tendon on the lesser trochanter. There is feathery fluid signal seen surrounding the iliopsoas musculature. Mildly increased intrasubstance signal thickening seen within the hamstrings tendon insertion site. The gluteal tendons are intact. There is mild feathery signal seen within the gluteal musculature. Other findings Miscellaneous: The visualized internal pelvic contents appear unremarkable. IMPRESSION: 1. Nondisplaced incomplete fracture seen through the posterosuperior greater trochanter with surrounding marrow edema. 2. Partial insertional tear of the iliopsoas tendon with iliopsoas muscular strain/edema. 3. Mild gluteal musculature edema. 4. Insertional hamstrings tendinosis 5. Small femoroacetabular joint effusion 6. Mild femoroacetabular joint osteoarthritis.  Is bone  which Electronically Signed   By: Prudencio Pair M.D.   On: 01/21/2020 23:39   DG Chest Portable 1 View  Result Date: 01/22/2020 CLINICAL DATA:  Preoperative evaluation for left hip fracture. EXAM: PORTABLE CHEST 1 VIEW COMPARISON:  Prior radiograph from 06/09/2013. FINDINGS: Heart size within normal limits. Mediastinal silhouette normal. Aortic atherosclerosis. Lungs mildly hypoinflated with elevation of the right hemidiaphragm. No focal infiltrates. No pulmonary edema or visible pleural effusion. No pneumothorax. No acute osseous finding. IMPRESSION: No active cardiopulmonary disease. Electronically Signed   By: Jeannine Boga M.D.   On: 01/22/2020 01:17   DG FEMUR MIN 2 VIEWS LEFT  Result Date: 01/21/2020 CLINICAL DATA:  Pain status post fall EXAM: LEFT FEMUR 2 VIEWS COMPARISON:  None.  FINDINGS: There is no acute displaced fracture or dislocation. Moderate degenerative changes are noted of left hip. There are vascular calcifications. IMPRESSION: Negative. Electronically Signed   By: Constance Holster M.D.   On: 01/21/2020 21:20    EKG: pending  Assessment/Plan Principal Problem:   Closed left hip fracture, initial encounter [facility])   Patient has suffered a closed left hip fracture status post what seems to be a mechanical fall.  There does not seem to be any underlying medical cause of the patient's fall.  Obtaining preoperative EKG  Patient has been made n.p.o. in anticipation for operative intervention  Patient been placed on continuous intravenous fluids with normal saline and potassium  Provide patient with as needed intravenous opiate-based analgesics for pain.  Anticipate eventual disposition to be discharged home with home health physical therapy  Vitamin D level has been ordered and is pending.  Active Problems:   Coronary artery disease involving native coronary artery of native heart with angina pectoris [facility])   Patient is currently chest pain-free  Will place patient on telemetry  EKG ordered  We will restart patient's home regimen of Plavix on 3/28  Continue home regimen of statin therapy, ACE inhibitor and AV nodal blocking agents    Essential hypertension   Continue home regimen of antihypertensive therapy    Tobacco abuse   Counseling patient on cessation  Offered patient nicotine replacement therapy while hospitalized  Acute Urinary Retention   Patient noted to have difficulty urinating shortly after arrival to the emergency department  Scan revealing greater than 600 cc of urine  Patient likely experiencing transient urinary retention in the setting of several doses of opiate-based analgesics  Foley catheter has been placed which can likely be removed after hip repair with voiding trial      Code Status:  Full  code Family Communication: Declined Disposition Plan: Patient is anticipated to be discharged to home with home health services once patient has met maximum benefit from current hospitalization.   Consults called:  Dr. Lyla Glassing with Orthopedic surgery  Admission status: Patient will be admitted to Inpatient and is anticipated to remain in the hospital for greater than 2 midnights.   Vernelle Emerald MD [facility] Pager [phone]  If 7PM-7AM, please contact night-coverage [email or web address] Use universal [place] password for that web site. If you do not have the password, please call the hospital operator.  01/22/2020, 2:44 AM

## 2020-01-23 DIAGNOSIS — I10 Essential (primary) hypertension: Secondary | ICD-10-CM

## 2020-01-23 DIAGNOSIS — I25119 Atherosclerotic heart disease of native coronary artery with unspecified angina pectoris: Secondary | ICD-10-CM

## 2020-01-23 LAB — URINE CULTURE: Culture: NO GROWTH

## 2020-01-23 NOTE — Progress Notes (Signed)
Physical Therapy Evaluation Patient Details Name: Anthony Coffey MRN: [id number] DOB: 12/07/1955 Today's Date: 01/23/2020   History of Present Illness  64yo with hx CAD, HTN presented with L hip fracture after recent mechanical fall (Pt tripped and fell about 6 feet down some steps). Orthopedic Surgery following and pt now s/p surgery on 3/27. PMHx: coronary artery disease, hypertension, nicotine dependence.   Clinical Impression  PTA patient was fully independent with functional mobility with no AD, caring for 44 year old mother (IADLs) and medication management. Pt admitted with above diagnosis.  Pt able to perform transfers and gait with RW with CGA to supervision today.  Minimal VCs required for self management/sequencing/body positioning due to overall strength (grossly 4+/5 UE & RLE).  Pt educated on ambulation with AD demonstrating good carryover with minimal intermittent VCs required throughout tx. Pt states he is unable to perform donning/doffing of socks and could benefit from OT evaluation for assistance with independence with dressing. Pt currently with functional limitations due to the deficits listed below (see PT Problem List). Due to current ability and anticipated progression anticipate return home with no services. PT will continue following acutely to progress HEP and education for AD usage/stair training.    Follow Up Recommendations No PT follow up;Supervision - Intermittent    Equipment Recommendations  None recommended by PT    Recommendations for Other Services OT consult     Precautions / Restrictions Precautions Precautions: Fall Restrictions Weight Bearing Restrictions: Yes Other Position/Activity Restrictions: WBAT      Mobility  Bed Mobility Overal bed mobility: Modified Independent       General bed mobility comments: No assistance required  Transfers Overall transfer level: Needs assistance Equipment used: Rolling walker (2 wheeled) Transfers: Sit  to/from Stand Sit to Stand: Min guard     General transfer comment: Min Guard for initial standing balance  Ambulation/Gait Ambulation/Gait assistance: Scientist, forensic (Feet): 25 Feet Assistive device: Rolling walker (2 wheeled) Gait Pattern/deviations: Step-to pattern;Decreased stride length;Decreased stance time - left;Decreased weight shift to left     General Gait Details: Decreased speed, antalgic gait, reduced WS L.      Balance Overall balance assessment: Needs assistance Sitting-balance support: No upper extremity supported Sitting balance-Leahy Scale: Normal     Standing balance support: Bilateral upper extremity supported Standing balance-Leahy Scale: Fair          Pertinent Vitals/Pain Pain Assessment: Faces Faces Pain Scale: Hurts little more Pain Location: LLE Pain Descriptors / Indicators: Aching;Operative site guarding;Grimacing;Guarding Pain Intervention(s): Limited activity within patient's tolerance;Repositioned    Home Living Family/patient expects to be discharged to:: Private residence Living Arrangements: Parent Available Help at Discharge: Family Type of Home: House Home Access: Stairs to enter Entrance Stairs-Rails: Right Entrance Stairs-Number of Steps: 5 Home Layout: One level;Other (Comment)(1step into front) Home Equipment: Walker - 2 wheels;Bedside commode;Hand held shower head      Prior Function Level of Independence: Independent         Comments: Retired,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, cook, medicine)     Hand Dominance   Dominant Hand: Right    Extremity/Trunk Assessment   Upper Extremity Assessment Upper Extremity Assessment: Defer to OT evaluation    Lower Extremity Assessment Lower Extremity Assessment: Generalized weakness       Communication   Communication: No difficulties  Cognition Arousal/Alertness: Awake/alert Behavior During Therapy: WFL for tasks assessed/performed Overall  Cognitive Status: Within Functional Limits for tasks assessed      General Comments General comments (  skin integrity, edema, etc.): VSS throughout tx    Exercises General Exercises - Lower Extremity Ankle Circles/Pumps: AROM;Both;20 reps Quad Sets: AROM;Both;10 reps Short Arc Quad: AROM;Both;10 reps Long Arc Quad: AROM;Both;10 reps Heel Slides: AROM;10 reps;Left Straight Leg Raises: AROM;Left;5 reps Toe Raises: AROM;Both;15 reps Heel Raises: AROM;Both;10 reps   Assessment/Plan    PT Assessment Patient needs continued PT services  PT Problem List Decreased strength;Decreased balance;Decreased mobility;Pain;Decreased range of motion       PT Treatment Interventions Gait training;Functional mobility training;Therapeutic activities;Therapeutic exercise;Balance training;Neuromuscular re-education;Patient/family education;Modalities;Manual techniques;DME instruction;Stair training    PT Goals (Current goals can be found in the Care Plan section)  Acute Rehab PT Goals Patient Stated Goal: Return home PT Goal Formulation: With patient Time For Goal Achievement: 02/06/20 Potential to Achieve Goals: Good    Frequency Min 5X/week    AM-PAC PT "6 Clicks" Mobility  Outcome Measure Help needed turning from your back to your side while in a flat bed without using bedrails?: None Help needed moving from lying on your back to sitting on the side of a flat bed without using bedrails?: None Help needed moving to and from a bed to a chair (including a wheelchair)?: None Help needed standing up from a chair using your arms (e.g., wheelchair or bedside chair)?: None Help needed to walk in hospital room?: A Little Help needed climbing 3-5 steps with a railing? : A Little 6 Click Score: 22    End of Session   Activity Tolerance: Patient tolerated treatment well Patient left: in chair;with call bell/phone within reach Nurse Communication: Mobility status PT Visit Diagnosis: Muscle weakness  (generalized) (M62.81);History of falling (Z91.81);Other abnormalities of gait and mobility (R26.89);Pain Pain - Right/Left: Left Pain - part of body: Hip    Time: 0263-7858 PT Time Calculation (min) (ACUTE ONLY): 28 min   Charges:   PT Evaluation $PT Eval Low Complexity: 1 Low PT Treatments $Therapeutic Exercise: 8-22 mins        Oda Cogan PT, DPT Acute Rehab [facility] P: [phone]   Leslee Suire A Geralynn Capri 01/23/2020, 1:27 PM

## 2020-01-23 NOTE — Progress Notes (Signed)
Anthony Coffey  MRN: [id number] DOB/Age: 62-Jun-1957 62 y.o. Physician: Lynnea Maizes, M.D. 1 Day Post-Op Procedure(s) (LRB): INTRAMEDULLARY (IM) NAIL INTERTROCHANTRIC (Left)  Subjective: Patient resting comfortably in bed.  Reports general "stiffness" about left hip.  Good appetite.  No nausea or vomiting. Vital Signs Temp:  [97 F (36.1 C)-98.9 F (37.2 C)] 98 F (36.7 C) (03/28 0403) Pulse Rate:  [61-94] 94 (03/28 0840) Resp:  [15-18] 17 (03/28 0403) BP: (93-126)/(55-70) 99/59 (03/28 0840) SpO2:  [90 %-100 %] 100 % (03/28 0403)  Lab Results Recent Labs    01/22/20 0111 01/22/20 1029  WBC 12.6* 9.1  HGB 13.8 14.3  HCT 41.6 41.7  PLT 254 247   BMET Recent Labs    01/22/20 0111 01/22/20 1029  NA 135  --   K 3.8  --   CL 104  --   CO2 21*  --   GLUCOSE 97  --   BUN 10  --   CREATININE 0.86 0.69  CALCIUM 9.0  --    INR  Date Value Ref Range Status  01/22/2020 1.0 0.8 - 1.2 Final    Comment:    (NOTE) INR goal varies based on device and disease states. Performed at [facility], [address], [place], [place] [address]      Exam  Left hip dressings clean and dry.  Thigh compartments soft.  Good ankle motion and neurovascular intact distally in the left lower extremity.  Plan Continue to mobilize with physical therapy, weightbearing as tolerated left lower extremity.  Follow-up with Dr. Aundria Rud in 2 weeks [facility] Wasim Hurlbut 01/23/2020, 10:01 AM   Contact # [phone]

## 2020-01-23 NOTE — Progress Notes (Signed)
PROGRESS NOTE    Anthony Coffey  HTD:[id number] DOB: 1956-09-12 DOA: 01/21/2020 PCP: Pt, No Pcp Per (Inactive)    Brief Narrative:  63yo with hx CAD, HTN presented with L hip fracture after recent mechanical fall. Orthopedic Surgery following and pt now s/p surgery on 3/27  Assessment & Plan:   Principal Problem:   Closed left hip fracture, initial encounter [facility]) Active Problems:   Coronary artery disease involving native coronary artery of native heart with angina pectoris (HCC)   Tobacco abuse   Essential hypertension   Acute urinary retention  Principal Problem:   Closed left hip fracture, initial encounter [facility])   Orthopedic surgery consulted and pt is now s/p surgery 3/27  Continue analgesic as needed per Orthopedic Surgery  PT/OT consulted, per PT no PT recs needed. OT eval pending  Active Problems:   Coronary artery disease involving native coronary artery of native heart with angina pectoris (HCC)   Stable at this time  Plan to resume Plavix on 3/28 if OK with Orthopedics  Continue home regimen of statin therapy, ACE inhibitor and AV nodal blocking agents    Essential hypertension   BP remains stable  Continue home regimen of antihypertensive therapy    Tobacco abuse   Counseling patient on cessation  Admitting physician offered patient nicotine replacement therapy while hospitalized  Acute Urinary Retention   Patient noted to have difficulty urinating shortly after arrival to the emergency department  Foley removed  Cont to follow I/o  DVT prophylaxis: Lovenox subQ Code Status: Full Family Communication: Pt in room, family not at bedside Disposition Plan: From home, awaiting OT eval. Likely d/c home 3/29 when cleared by Ortho  Consultants:   Orthopedic Surgery  Procedures:   L hip surgery 3/27  Antimicrobials: Anti-infectives (From admission, onward)   Start     Dose/Rate Route Frequency Ordered Stop   01/22/20 1400   ceFAZolin (ANCEF) IVPB 1 g/50 mL premix     1 g 100 mL/hr over 30 Minutes Intravenous Every 6 hours 01/22/20 1253 01/23/20 0759   01/22/20 1100  ceFAZolin (ANCEF) IVPB 2g/100 mL premix     2 g 200 mL/hr over 30 Minutes Intravenous  Once 01/22/20 1052 01/22/20 1155   01/22/20 1050  ceFAZolin (ANCEF) 2-4 GM/100ML-% IVPB    Note to Pharmacy: Shireen Quan   : cabinet override      01/22/20 1050 01/22/20 1133      Subjective: Complains of residual post-op soreness  Objective: Vitals:   01/22/20 2337 01/23/20 0403 01/23/20 0840 01/23/20 1350  BP: (!) 93/55 94/60 (!) 99/59 99/60  Pulse: 75 72 94 69  Resp: 17 17  18   Temp: 98.1 F (36.7 C) 98 F (36.7 C)  98.4 F (36.9 C)  TempSrc: Oral Oral  Oral  SpO2: 93% 100%  98%    Intake/Output Summary (Last 24 hours) at 01/23/2020 1800 Last data filed at 01/23/2020 1713 Gross per 24 hour  Intake 2289.01 ml  Output 2400 ml  Net -110.99 ml   There were no vitals filed for this visit.  Examination: General exam: Awake, laying in bed, in nad Respiratory system: Normal respiratory effort, no wheezing Cardiovascular system: regular rate, s1, s2 Gastrointestinal system: Soft, nondistended, positive BS Central nervous system: CN2-12 grossly intact, strength intact Extremities: Perfused, no clubbing Skin: Normal skin turgor, no notable skin lesions seen Psychiatry: Mood normal // no visual hallucinations   Data Reviewed: I have personally reviewed following labs and imaging studies  CBC:  Recent Labs  Lab 01/22/20 0111 01/22/20 1029  WBC 12.6* 9.1  HGB 13.8 14.3  HCT 41.6 41.7  MCV 89.3 88.9  PLT 254 247   Basic Metabolic Panel: Recent Labs  Lab 01/22/20 0111 01/22/20 1029  NA 135  --   K 3.8  --   CL 104  --   CO2 21*  --   GLUCOSE 97  --   BUN 10  --   CREATININE 0.86 0.69  CALCIUM 9.0  --    GFR: CrCl cannot be calculated (Unknown ideal weight.). Liver Function Tests: No results for input(s): AST, ALT, ALKPHOS,  BILITOT, PROT, ALBUMIN in the last 168 hours. No results for input(s): LIPASE, AMYLASE in the last 168 hours. No results for input(s): AMMONIA in the last 168 hours. Coagulation Profile: Recent Labs  Lab 01/22/20 0111  INR 1.0   Cardiac Enzymes: No results for input(s): CKTOTAL, CKMB, CKMBINDEX, TROPONINI in the last 168 hours. BNP (last 3 results) No results for input(s): PROBNP in the last 8760 hours. HbA1C: No results for input(s): HGBA1C in the last 72 hours. CBG: No results for input(s): GLUCAP in the last 168 hours. Lipid Profile: No results for input(s): CHOL, HDL, LDLCALC, TRIG, CHOLHDL, LDLDIRECT in the last 72 hours. Thyroid Function Tests: No results for input(s): TSH, T4TOTAL, FREET4, T3FREE, THYROIDAB in the last 72 hours. Anemia Panel: No results for input(s): VITAMINB12, FOLATE, FERRITIN, TIBC, IRON, RETICCTPCT in the last 72 hours. Sepsis Labs: No results for input(s): PROCALCITON, LATICACIDVEN in the last 168 hours.  Recent Results (from the past 240 hour(s))  SARS CORONAVIRUS 2 (TAT 6-24 HRS) Nasopharyngeal Nasopharyngeal Swab     Status: None   Collection Time: 01/22/20  1:05 AM   Specimen: Nasopharyngeal Swab  Result Value Ref Range Status   SARS Coronavirus 2 NEGATIVE NEGATIVE Final    Comment: (NOTE) SARS-CoV-2 target nucleic acids are NOT DETECTED. The SARS-CoV-2 RNA is generally detectable in upper and lower respiratory specimens during the acute phase of infection. Negative results do not preclude SARS-CoV-2 infection, do not rule out co-infections with other pathogens, and should not be used as the sole basis for treatment or other patient management decisions. Negative results must be combined with clinical observations, patient history, and epidemiological information. The expected result is Negative. Fact Sheet for Patients: HairSlick.no Fact Sheet for Healthcare  Providers: quierodirigir.com This test is not yet approved or cleared by the [place] FDA and  has been authorized for detection and/or diagnosis of SARS-CoV-2 by FDA under an Emergency Use Authorization (EUA). This EUA will remain  in effect (meaning this test can be used) for the duration of the COVID-19 declaration under Section 56 4(b)(1) of the Act, 21 U.S.C. section 360bbb-3(b)(1), unless the authorization is terminated or revoked sooner. Performed at [facility], [address], [place], [place] [address]   Culture, Urine     Status: None   Collection Time: 01/22/20  3:00 AM   Specimen: Urine, Catheterized  Result Value Ref Range Status   Specimen Description URINE, CATHETERIZED  Final   Special Requests NONE  Final   Culture   Final    NO GROWTH Performed at [facility], [address], [place], [place] [address]    Report Status 01/23/2020 FINAL  Final  Surgical pcr screen     Status: None   Collection Time: 01/22/20  9:07 AM   Specimen: Nasal Mucosa; Nasal Swab  Result Value Ref Range Status  MRSA, PCR NEGATIVE NEGATIVE Final   Staphylococcus aureus NEGATIVE NEGATIVE Final    Comment: (NOTE) The Xpert SA Assay (FDA approved for NASAL specimens in patients 75 years of age and older), is one component of a comprehensive surveillance program. It is not intended to diagnose infection nor to guide or monitor treatment. Performed at [facility], [address] [place], [place] [address]      Radiology Studies: DG Pelvis 1-2 Views  Result Date: 01/21/2020 CLINICAL DATA:  Pain status post fall EXAM: PELVIS - 1-2 VIEW COMPARISON:  None. FINDINGS: There is no acute displaced fracture or dislocation. Mild-to-moderate degenerative changes are noted of both hips. The osseous mineralization is within normal limits. Atherosclerotic changes are noted of the visualized vasculature. IMPRESSION: No acute displaced fracture or  dislocation. Mild-to-moderate degenerative changes of both hips. Electronically Signed   By: Katherine Mantle M.D.   On: 01/21/2020 21:19   DG Knee 2 Views Left  Result Date: 01/21/2020 CLINICAL DATA:  Left knee pain after fall. EXAM: LEFT KNEE - 1-2 VIEW COMPARISON:  Femur radiograph earlier today. FINDINGS: No evidence of fracture, dislocation, or joint effusion. Mild patellofemoral spurring. Joint spaces are grossly preserved. Soft tissue edema anteriorly. There are vascular calcifications. IMPRESSION: Anterior soft tissue edema. No fracture or dislocation. Electronically Signed   By: Narda Rutherford M.D.   On: 01/21/2020 22:11   MR HIP LEFT WO CONTRAST  Result Date: 01/21/2020 CLINICAL DATA:  Hip pain after fall EXAM: MR OF THE LEFT HIP WITHOUT CONTRAST TECHNIQUE: Multiplanar, multisequence MR imaging was performed. No intravenous contrast was administered. COMPARISON:  Radiograph same day FINDINGS: Bones: There is a incomplete nondisplaced fracture seen through the posterosuperior greater trochanter, best seen on series 5, image 22. Surrounding marrow edema seen within the greater trochanter and proximal femoral shaft. The femoral head is normal in shape, configuration and sphericity. No osseous bump at the femoral head neck junction. The visualized bony pelvis appears normal. The visualized sacroiliac joints and symphysis pubis appear normal. Articular cartilage and labrum Articular cartilage: Mild chondral thinning seen at the superior femoroacetabular joint. The ligamentum teres is intact Labrum: There is no gross labral tear or paralabral abnormality. Joint or bursal effusion Joint effusion: A small hip joint effusion is seen. Bursae: No focal periarticular fluid collection. Muscles and tendons Muscles and tendons: There is a partial insertional tear seen at the iliopsoas tendon on the lesser trochanter. There is feathery fluid signal seen surrounding the iliopsoas musculature. Mildly increased  intrasubstance signal thickening seen within the hamstrings tendon insertion site. The gluteal tendons are intact. There is mild feathery signal seen within the gluteal musculature. Other findings Miscellaneous: The visualized internal pelvic contents appear unremarkable. IMPRESSION: 1. Nondisplaced incomplete fracture seen through the posterosuperior greater trochanter with surrounding marrow edema. 2. Partial insertional tear of the iliopsoas tendon with iliopsoas muscular strain/edema. 3. Mild gluteal musculature edema. 4. Insertional hamstrings tendinosis 5. Small femoroacetabular joint effusion 6. Mild femoroacetabular joint osteoarthritis.  Is bone which Electronically Signed   By: Jonna Clark M.D.   On: 01/21/2020 23:39   DG Chest Portable 1 View  Result Date: 01/22/2020 CLINICAL DATA:  Preoperative evaluation for left hip fracture. EXAM: PORTABLE CHEST 1 VIEW COMPARISON:  Prior radiograph from 06/09/2013. FINDINGS: Heart size within normal limits. Mediastinal silhouette normal. Aortic atherosclerosis. Lungs mildly hypoinflated with elevation of the right hemidiaphragm. No focal infiltrates. No pulmonary edema or visible pleural effusion. No pneumothorax. No acute osseous finding. IMPRESSION: No active cardiopulmonary  disease. Electronically Signed   By: Jeannine Boga M.D.   On: 01/22/2020 01:17   DG C-Arm 1-60 Min  Result Date: 01/22/2020 CLINICAL DATA:  Fracture post fall EXAM: DG C-ARM 1-60 MIN; OPERATIVE LEFT HIP WITH PELVIS CONTRAST:  None FLUOROSCOPY TIME:  See operative report COMPARISON:  MR from previous day FINDINGS: Changes of IM rod and sliding screw fixation across the femoral neck. A single distal interlocking pin is noted. IMPRESSION: Orthopedic fixation of  proximal femur fracture INDICATION: Fracture post fall Electronically Signed   By: Lucrezia Europe M.D.   On: 01/22/2020 13:59   DG HIP OPERATIVE UNILAT W OR W/O PELVIS LEFT  Result Date: 01/22/2020 CLINICAL DATA:  Fracture  post fall EXAM: DG C-ARM 1-60 MIN; OPERATIVE LEFT HIP WITH PELVIS CONTRAST:  None FLUOROSCOPY TIME:  See operative report COMPARISON:  MR from previous day FINDINGS: Changes of IM rod and sliding screw fixation across the femoral neck. A single distal interlocking pin is noted. IMPRESSION: Orthopedic fixation of  proximal femur fracture INDICATION: Fracture post fall Electronically Signed   By: Lucrezia Europe M.D.   On: 01/22/2020 13:59   DG FEMUR MIN 2 VIEWS LEFT  Result Date: 01/21/2020 CLINICAL DATA:  Pain status post fall EXAM: LEFT FEMUR 2 VIEWS COMPARISON:  None. FINDINGS: There is no acute displaced fracture or dislocation. Moderate degenerative changes are noted of left hip. There are vascular calcifications. IMPRESSION: Negative. Electronically Signed   By: Constance Holster M.D.   On: 01/21/2020 21:20    Scheduled Meds: . atorvastatin  80 mg Oral Daily  . clopidogrel  75 mg Oral Daily  . docusate sodium  100 mg Oral BID  . enoxaparin (LOVENOX) injection  40 mg Subcutaneous Q24H  . feeding supplement (ENSURE ENLIVE)  237 mL Oral QHS  . fenofibrate  160 mg Oral Daily  . lisinopril  10 mg Oral Daily  . metoprolol tartrate  25 mg Oral BID  . multivitamin with minerals  1 tablet Oral Daily  . nicotine  14 mg Transdermal Daily   Continuous Infusions: . 0.9 % NaCl with KCl 20 mEq / L 125 mL/hr at 01/23/20 1610  . lactated ringers 10 mL/hr at 01/22/20 1110     LOS: 1 day   Marylu Lund, MD [facility] Pager On Amion  If 7PM-7AM, please contact night-coverage 01/23/2020, 6:00 PM

## 2020-01-24 DIAGNOSIS — S72143A Displaced intertrochanteric fracture of unspecified femur, initial encounter for closed fracture: Secondary | ICD-10-CM | POA: Insufficient documentation

## 2020-01-24 MED ORDER — CLOPIDOGREL BISULFATE 75 MG PO TABS: 75.0000 mg | ORAL_TABLET | Freq: Every day | ORAL | 11 refills | Status: DC

## 2020-01-24 NOTE — Plan of Care (Signed)

## 2020-01-24 NOTE — Discharge Summary (Signed)
Physician Discharge Summary  Anthony Coffey VVO:[id number] DOB: 06-20-1956 DOA: 01/21/2020  PCP: Pt, No Pcp Per (Inactive)  Admit date: 01/21/2020 Discharge date: 01/24/2020  Admitted From: Home Disposition:  home  Recommendations for Outpatient Follow-up:  1. Follow up with PCP in  2-3 weeks 2. Follow up with Orthopedic Surgery in 2 weeks with Dr. Aundria Rud  Discharge Condition:Improved CODE STATUS:Full Diet recommendation: Heart healthy   Brief/Interim Summary: 63yo with hx CAD, HTN presented with L hip fracture after recent mechanical fall. Orthopedic Surgery following and pt now s/p surgery on 3/27  Discharge Diagnoses:  Principal Problem:   Closed left hip fracture, initial encounter [facility]) Active Problems:   Coronary artery disease involving native coronary artery of native heart with angina pectoris (HCC)   Tobacco abuse   Essential hypertension   Acute urinary retention   Principal Problem: Closed left hip fracture, initial encounter [facility])   Orthopedic surgery consulted and pt is now s/p surgery 3/27  Continue analgesic as needed per Orthopedic Surgery  PT/OT consulted, no needs identified per PT and OT  Active Problems: Coronary artery disease involving native coronary artery of native heart with angina pectoris (HCC)   Stable at this time  Resumed Plavix post op  Continue home regimen of statin therapy, ACE inhibitor and AV nodal blocking agents  Essential hypertension   BP remains stable  Continue home regimen of antihypertensive therapy  Tobacco abuse   Counseling patient on cessation  Admitting physician offered patient nicotine replacement therapy while hospitalized  Acute Urinary Retention   Patient noted to have difficulty urinating shortly after arrival to the emergency department  Foley removed, pt voiding   Discharge Instructions   Allergies as of 01/24/2020   No Known Allergies     Medication List    TAKE  these medications   acetaminophen 500 MG tablet Commonly known as: TYLENOL Take 1 tablet (500 mg total) by mouth every 6 (six) hours as needed for headache (pain).   atorvastatin 80 MG tablet Commonly known as: LIPITOR Take 1 tablet (80 mg total) by mouth daily.   clopidogrel 75 MG tablet Commonly known as: PLAVIX Take 1 tablet (75 mg total) by mouth daily.   fenofibrate 160 MG tablet Take 1 tablet (160 mg total) by mouth daily.   Fish Oil 1000 MG Caps Take 4 capsules (4,000 mg total) by mouth daily.   lisinopril 10 MG tablet Commonly known as: ZESTRIL Take 1 tablet (10 mg total) by mouth daily.   metoprolol tartrate 25 MG tablet Commonly known as: LOPRESSOR Take 1 tablet (25 mg total) by mouth 2 (two) times daily.   nitroGLYCERIN 0.4 MG SL tablet Commonly known as: NITROSTAT Place 1 tablet (0.4 mg total) under the tongue every 5 (five) minutes as needed for chest pain.      Follow-up Information    Yolonda Kida, MD In 2 weeks.   Specialty: Orthopedic Surgery Why: For suture removal Contact information: [address] [place] [address] [phone]        Pt, No Pcp Per .        Lyn Records, MD .   Specialty: Cardiology Contact information: [phone] [address] [place] [address] [phone]        Follow up with your PCP in 2-3 weeks Follow up.          No Known Allergies  Consultations:  Orthopedic Surgery  Procedures/Studies: DG Pelvis 1-2 Views  Result Date: 01/21/2020  CLINICAL DATA:  Pain status post fall EXAM: PELVIS - 1-2 VIEW COMPARISON:  None. FINDINGS: There is no acute displaced fracture or dislocation. Mild-to-moderate degenerative changes are noted of both hips. The osseous mineralization is within normal limits. Atherosclerotic changes are noted of the visualized vasculature. IMPRESSION: No acute displaced fracture or dislocation. Mild-to-moderate degenerative changes of both hips.  Electronically Signed   By: Katherine Mantle M.D.   On: 01/21/2020 21:19   DG Knee 2 Views Left  Result Date: 01/21/2020 CLINICAL DATA:  Left knee pain after fall. EXAM: LEFT KNEE - 1-2 VIEW COMPARISON:  Femur radiograph earlier today. FINDINGS: No evidence of fracture, dislocation, or joint effusion. Mild patellofemoral spurring. Joint spaces are grossly preserved. Soft tissue edema anteriorly. There are vascular calcifications. IMPRESSION: Anterior soft tissue edema. No fracture or dislocation. Electronically Signed   By: Narda Rutherford M.D.   On: 01/21/2020 22:11   MR HIP LEFT WO CONTRAST  Result Date: 01/21/2020 CLINICAL DATA:  Hip pain after fall EXAM: MR OF THE LEFT HIP WITHOUT CONTRAST TECHNIQUE: Multiplanar, multisequence MR imaging was performed. No intravenous contrast was administered. COMPARISON:  Radiograph same day FINDINGS: Bones: There is a incomplete nondisplaced fracture seen through the posterosuperior greater trochanter, best seen on series 5, image 22. Surrounding marrow edema seen within the greater trochanter and proximal femoral shaft. The femoral head is normal in shape, configuration and sphericity. No osseous bump at the femoral head neck junction. The visualized bony pelvis appears normal. The visualized sacroiliac joints and symphysis pubis appear normal. Articular cartilage and labrum Articular cartilage: Mild chondral thinning seen at the superior femoroacetabular joint. The ligamentum teres is intact Labrum: There is no gross labral tear or paralabral abnormality. Joint or bursal effusion Joint effusion: A small hip joint effusion is seen. Bursae: No focal periarticular fluid collection. Muscles and tendons Muscles and tendons: There is a partial insertional tear seen at the iliopsoas tendon on the lesser trochanter. There is feathery fluid signal seen surrounding the iliopsoas musculature. Mildly increased intrasubstance signal thickening seen within the hamstrings tendon  insertion site. The gluteal tendons are intact. There is mild feathery signal seen within the gluteal musculature. Other findings Miscellaneous: The visualized internal pelvic contents appear unremarkable. IMPRESSION: 1. Nondisplaced incomplete fracture seen through the posterosuperior greater trochanter with surrounding marrow edema. 2. Partial insertional tear of the iliopsoas tendon with iliopsoas muscular strain/edema. 3. Mild gluteal musculature edema. 4. Insertional hamstrings tendinosis 5. Small femoroacetabular joint effusion 6. Mild femoroacetabular joint osteoarthritis.  Is bone which Electronically Signed   By: Jonna Clark M.D.   On: 01/21/2020 23:39   DG Chest Portable 1 View  Result Date: 01/22/2020 CLINICAL DATA:  Preoperative evaluation for left hip fracture. EXAM: PORTABLE CHEST 1 VIEW COMPARISON:  Prior radiograph from 06/09/2013. FINDINGS: Heart size within normal limits. Mediastinal silhouette normal. Aortic atherosclerosis. Lungs mildly hypoinflated with elevation of the right hemidiaphragm. No focal infiltrates. No pulmonary edema or visible pleural effusion. No pneumothorax. No acute osseous finding. IMPRESSION: No active cardiopulmonary disease. Electronically Signed   By: Rise Mu M.D.   On: 01/22/2020 01:17   DG C-Arm 1-60 Min  Result Date: 01/22/2020 CLINICAL DATA:  Fracture post fall EXAM: DG C-ARM 1-60 MIN; OPERATIVE LEFT HIP WITH PELVIS CONTRAST:  None FLUOROSCOPY TIME:  See operative report COMPARISON:  MR from previous day FINDINGS: Changes of IM rod and sliding screw fixation across the femoral neck. A single distal interlocking pin is noted. IMPRESSION: Orthopedic fixation of  proximal femur  fracture INDICATION: Fracture post fall Electronically Signed   By: Lucrezia Europe M.D.   On: 01/22/2020 13:59   DG HIP OPERATIVE UNILAT W OR W/O PELVIS LEFT  Result Date: 01/22/2020 CLINICAL DATA:  Fracture post fall EXAM: DG C-ARM 1-60 MIN; OPERATIVE LEFT HIP WITH PELVIS  CONTRAST:  None FLUOROSCOPY TIME:  See operative report COMPARISON:  MR from previous day FINDINGS: Changes of IM rod and sliding screw fixation across the femoral neck. A single distal interlocking pin is noted. IMPRESSION: Orthopedic fixation of  proximal femur fracture INDICATION: Fracture post fall Electronically Signed   By: Lucrezia Europe M.D.   On: 01/22/2020 13:59   DG FEMUR MIN 2 VIEWS LEFT  Result Date: 01/21/2020 CLINICAL DATA:  Pain status post fall EXAM: LEFT FEMUR 2 VIEWS COMPARISON:  None. FINDINGS: There is no acute displaced fracture or dislocation. Moderate degenerative changes are noted of left hip. There are vascular calcifications. IMPRESSION: Negative. Electronically Signed   By: Constance Holster M.D.   On: 01/21/2020 21:20    Subjective: Eager to go home  Discharge Exam: Vitals:   01/24/20 0532 01/24/20 1303  BP: 116/66 101/77  Pulse: 73 (!) 55  Resp: 17 18  Temp: 98.2 F (36.8 C) 98 F (36.7 C)  SpO2: 96% 97%   Vitals:   01/23/20 1350 01/23/20 2153 01/24/20 0532 01/24/20 1303  BP: 99/60 (!) 107/59 116/66 101/77  Pulse: 69 63 73 (!) 55  Resp: 18 16 17 18   Temp: 98.4 F (36.9 C) 98.8 F (37.1 C) 98.2 F (36.8 C) 98 F (36.7 C)  TempSrc: Oral Oral Oral Oral  SpO2: 98% 100% 96% 97%    General: Pt is alert, awake, not in acute distress Cardiovascular: RRR, S1/S2 +, no rubs, no gallops Respiratory: CTA bilaterally, no wheezing, no rhonchi Abdominal: Soft, NT, ND, bowel sounds + Extremities: no edema, no cyanosis   The results of significant diagnostics from this hospitalization (including imaging, microbiology, ancillary and laboratory) are listed below for reference.     Microbiology: Recent Results (from the past 240 hour(s))  SARS CORONAVIRUS 2 (TAT 6-24 HRS) Nasopharyngeal Nasopharyngeal Swab     Status: None   Collection Time: 01/22/20  1:05 AM   Specimen: Nasopharyngeal Swab  Result Value Ref Range Status   SARS Coronavirus 2 NEGATIVE NEGATIVE  Final    Comment: (NOTE) SARS-CoV-2 target nucleic acids are NOT DETECTED. The SARS-CoV-2 RNA is generally detectable in upper and lower respiratory specimens during the acute phase of infection. Negative results do not preclude SARS-CoV-2 infection, do not rule out co-infections with other pathogens, and should not be used as the sole basis for treatment or other patient management decisions. Negative results must be combined with clinical observations, patient history, and epidemiological information. The expected result is Negative. Fact Sheet for Patients: SugarRoll.be Fact Sheet for Healthcare Providers: https://www.woods-mathews.com/ This test is not yet approved or cleared by the [place] FDA and  has been authorized for detection and/or diagnosis of SARS-CoV-2 by FDA under an Emergency Use Authorization (EUA). This EUA will remain  in effect (meaning this test can be used) for the duration of the COVID-19 declaration under Section 56 4(b)(1) of the Act, 21 U.S.C. section 360bbb-3(b)(1), unless the authorization is terminated or revoked sooner. Performed at [facility], [place] [address], [place], [place] [address]   Culture, Urine     Status: None   Collection Time: 01/22/20  3:00 AM   Specimen: Urine, Catheterized  Result Value  Ref Range Status   Specimen Description URINE, CATHETERIZED  Final   Special Requests NONE  Final   Culture   Final    NO GROWTH Performed at [facility], [address] [place], [place] [address]    Report Status 01/23/2020 FINAL  Final  Surgical pcr screen     Status: None   Collection Time: 01/22/20  9:07 AM   Specimen: Nasal Mucosa; Nasal Swab  Result Value Ref Range Status   MRSA, PCR NEGATIVE NEGATIVE Final   Staphylococcus aureus NEGATIVE NEGATIVE Final    Comment: (NOTE) The Xpert SA Assay (FDA approved for NASAL specimens in patients 62 years of age and older), is one  component of a comprehensive surveillance program. It is not intended to diagnose infection nor to guide or monitor treatment. Performed at [facility], [address] [place], [place] [address]      Labs: BNP (last 3 results) No results for input(s): BNP in the last 8760 hours. Basic Metabolic Panel: Recent Labs  Lab 01/22/20 0111 01/22/20 1029  NA 135  --   K 3.8  --   CL 104  --   CO2 21*  --   GLUCOSE 97  --   BUN 10  --   CREATININE 0.86 0.69  CALCIUM 9.0  --    Liver Function Tests: No results for input(s): AST, ALT, ALKPHOS, BILITOT, PROT, ALBUMIN in the last 168 hours. No results for input(s): LIPASE, AMYLASE in the last 168 hours. No results for input(s): AMMONIA in the last 168 hours. CBC: Recent Labs  Lab 01/22/20 0111 01/22/20 1029  WBC 12.6* 9.1  HGB 13.8 14.3  HCT 41.6 41.7  MCV 89.3 88.9  PLT 254 247   Cardiac Enzymes: No results for input(s): CKTOTAL, CKMB, CKMBINDEX, TROPONINI in the last 168 hours. BNP: Invalid input(s): POCBNP CBG: No results for input(s): GLUCAP in the last 168 hours. D-Dimer No results for input(s): DDIMER in the last 72 hours. Hgb A1c No results for input(s): HGBA1C in the last 72 hours. Lipid Profile No results for input(s): CHOL, HDL, LDLCALC, TRIG, CHOLHDL, LDLDIRECT in the last 72 hours. Thyroid function studies No results for input(s): TSH, T4TOTAL, T3FREE, THYROIDAB in the last 72 hours.  Invalid input(s): FREET3 Anemia work up No results for input(s): VITAMINB12, FOLATE, FERRITIN, TIBC, IRON, RETICCTPCT in the last 72 hours. Urinalysis    Component Value Date/Time   COLORURINE YELLOW 01/22/2020 0300   APPEARANCEUR HAZY (A) 01/22/2020 0300   LABSPEC 1.019 01/22/2020 0300   PHURINE 5.0 01/22/2020 0300   GLUCOSEU NEGATIVE 01/22/2020 0300   HGBUR SMALL (A) 01/22/2020 0300   BILIRUBINUR NEGATIVE 01/22/2020 0300   KETONESUR NEGATIVE 01/22/2020 0300   PROTEINUR NEGATIVE 01/22/2020 0300   NITRITE  NEGATIVE 01/22/2020 0300   LEUKOCYTESUR NEGATIVE 01/22/2020 0300   Sepsis Labs Invalid input(s): PROCALCITONIN,  WBC,  LACTICIDVEN Microbiology Recent Results (from the past 240 hour(s))  SARS CORONAVIRUS 2 (TAT 6-24 HRS) Nasopharyngeal Nasopharyngeal Swab     Status: None   Collection Time: 01/22/20  1:05 AM   Specimen: Nasopharyngeal Swab  Result Value Ref Range Status   SARS Coronavirus 2 NEGATIVE NEGATIVE Final    Comment: (NOTE) SARS-CoV-2 target nucleic acids are NOT DETECTED. The SARS-CoV-2 RNA is generally detectable in upper and lower respiratory specimens during the acute phase of infection. Negative results do not preclude SARS-CoV-2 infection, do not rule out co-infections with other pathogens, and should not be used as the  sole basis for treatment or other patient management decisions. Negative results must be combined with clinical observations, patient history, and epidemiological information. The expected result is Negative. Fact Sheet for Patients: HairSlick.no Fact Sheet for Healthcare Providers: quierodirigir.com This test is not yet approved or cleared by the [place] FDA and  has been authorized for detection and/or diagnosis of SARS-CoV-2 by FDA under an Emergency Use Authorization (EUA). This EUA will remain  in effect (meaning this test can be used) for the duration of the COVID-19 declaration under Section 56 4(b)(1) of the Act, 21 U.S.C. section 360bbb-3(b)(1), unless the authorization is terminated or revoked sooner. Performed at [facility], [address], [place], [place] [address]   Culture, Urine     Status: None   Collection Time: 01/22/20  3:00 AM   Specimen: Urine, Catheterized  Result Value Ref Range Status   Specimen Description URINE, CATHETERIZED  Final   Special Requests NONE  Final   Culture   Final    NO GROWTH Performed at [facility], [address]  [place], [place] [address]    Report Status 01/23/2020 FINAL  Final  Surgical pcr screen     Status: None   Collection Time: 01/22/20  9:07 AM   Specimen: Nasal Mucosa; Nasal Swab  Result Value Ref Range Status   MRSA, PCR NEGATIVE NEGATIVE Final   Staphylococcus aureus NEGATIVE NEGATIVE Final    Comment: (NOTE) The Xpert SA Assay (FDA approved for NASAL specimens in patients 41 years of age and older), is one component of a comprehensive surveillance program. It is not intended to diagnose infection nor to guide or monitor treatment. Performed at [facility], [address], [place], [place] [address]    Time spent: 30 min  SIGNED:   Rickey Barbara, MD  [facility] 01/24/2020, 5:14 PM  If 7PM-7AM, please contact night-coverage

## 2020-01-24 NOTE — Progress Notes (Signed)
Physical Therapy Treatment Patient Details Name: Anthony Coffey MRN: [id number] DOB: 1956/06/13 Today's Date: 01/24/2020    History of Present Illness 64yo with hx CAD, HTN presented with L hip fracture after recent mechanical fall (Pt tripped and fell about 6 feet down some steps). Orthopedic Surgery following and pt now s/p surgery on 3/27. PMHx: coronary artery disease, hypertension, nicotine dependence.     PT Comments    Pt received in bed and eager for PT. Pt reports he had been up ambulating in the room a little bit this morning. Pt reports mild pain t/o session in left hip. Pt mod I to get EOB and supervision assist for transfers and gait this date. Pt progressed ambulation tolerance with increased distance and improved gait pattern with increased weight bearing and acceptance onto LLE, however still relies heavily on RW for support due to dec weight acceptance on left. Pt performed supine and seated LE therex for continued strengthening and ROM. Pt educated on HEP exercises with handout provided. Pt required min cuing for correct performance of therex. Pt is progressing very well towards PT goals. Pt educated on fall prevention strategies once home, activity pacing, HEP and encouraged to continue getting up throughout the day for further mobility progression and prevention of secondary complications and deconditioning.     Follow Up Recommendations  No PT follow up;Supervision - Intermittent     Equipment Recommendations  None recommended by PT    Recommendations for Other Services OT consult     Precautions / Restrictions Precautions Precautions: Fall Restrictions Weight Bearing Restrictions: Yes LLE Weight Bearing: Weight bearing as tolerated    Mobility  Bed Mobility Overal bed mobility: Modified Independent             General bed mobility comments: no physical assist required, increased time to get EOB  Transfers Overall transfer level: Needs  assistance Equipment used: Rolling walker (2 wheeled) Transfers: Sit to/from Stand Sit to Stand: Supervision         General transfer comment: supervision for safety  Ambulation/Gait Ambulation/Gait assistance: Supervision Gait Distance (Feet): 120 Feet Assistive device: Rolling walker (2 wheeled) Gait Pattern/deviations: Step-to pattern;Decreased stride length;Decreased weight shift to left Gait velocity: dec   General Gait Details: pt progressed ambulation out in hallway, cont reliance on RW for UE support and dec weight bearing through LLE, pt reporting placing about ~50-60% through LLE, step to mildly antalgic gait pattern, no buckling or overt LOB noted   Stairs             Wheelchair Mobility    Modified Rankin (Stroke Patients Only)       Balance Overall balance assessment: Needs assistance Sitting-balance support: No upper extremity supported Sitting balance-Leahy Scale: Normal     Standing balance support: Bilateral upper extremity supported Standing balance-Leahy Scale: Fair                              Cognition Arousal/Alertness: Awake/alert Behavior During Therapy: WFL for tasks assessed/performed Overall Cognitive Status: Within Functional Limits for tasks assessed                                        Exercises Other Exercises Other Exercises: pt performed LE therex on LLE for ROM and strengthening including heel slides x10, glute sets, SLR, Hip abd, pillow squeeze, LAQ x5-10reps  Other Exercises: pt provided with HEP handout with pictures and instructions for SLR, hip ext, abd, glute sets, LAQ, pillow squeeze Other Exercises: pt encouraged to continue ambulating within room, sitting up in recliner and getting up for pressure relief/repositioning throughout the day Other Exercises: pt educated on fall prevention strategies around the house including limiting clutter and throw rugs to reduce tripping hazard, night  lights in bedroom/bathroom, and activity pacing    General Comments General comments (skin integrity, edema, etc.): VSS      Pertinent Vitals/Pain Faces Pain Scale: Hurts a little bit Pain Location: LLE Pain Descriptors / Indicators: Aching;Operative site guarding;Grimacing;Guarding Pain Intervention(s): Limited activity within patient's tolerance;Monitored during session;Repositioned    Home Living                      Prior Function            PT Goals (current goals can now be found in the care plan section)      Frequency    Min 5X/week      PT Plan Current plan remains appropriate    Co-evaluation              AM-PAC PT "6 Clicks" Mobility   Outcome Measure  Help needed turning from your back to your side while in a flat bed without using bedrails?: None Help needed moving from lying on your back to sitting on the side of a flat bed without using bedrails?: None Help needed moving to and from a bed to a chair (including a wheelchair)?: None Help needed standing up from a chair using your arms (e.g., wheelchair or bedside chair)?: None Help needed to walk in hospital room?: A Little Help needed climbing 3-5 steps with a railing? : A Little 6 Click Score: 22    End of Session Equipment Utilized During Treatment: Gait belt Activity Tolerance: Patient tolerated treatment well Patient left: in chair;with call bell/phone within reach Nurse Communication: Mobility status PT Visit Diagnosis: Muscle weakness (generalized) (M62.81);History of falling (Z91.81);Other abnormalities of gait and mobility (R26.89);Pain Pain - Right/Left: Left Pain - part of body: Hip     Time: 1130-1157 PT Time Calculation (min) (ACUTE ONLY): 27 min  Charges:  $Therapeutic Exercise: 8-22 mins $Therapeutic Activity: 8-22 mins                     Anthony Coffey PT, DPT 12:17 PM,01/24/20    Anthony Coffey Anthony Coffey 01/24/2020, 12:13 PM

## 2020-01-24 NOTE — Anesthesia Postprocedure Evaluation (Signed)
Anesthesia Post Note  Patient: Anthony Coffey  Procedure(s) Performed: INTRAMEDULLARY (IM) NAIL INTERTROCHANTRIC (Left )     Patient location during evaluation: PACU Anesthesia Type: General Level of consciousness: sedated and patient cooperative Pain management: pain level controlled Vital Signs Assessment: post-procedure vital signs reviewed and stable Respiratory status: spontaneous breathing Cardiovascular status: stable Anesthetic complications: no    Last Vitals:  Vitals:   01/24/20 0532 01/24/20 1303  BP: 116/66 101/77  Pulse: 73 (!) 55  Resp: 17 18  Temp: 36.8 C 36.7 C  SpO2: 96% 97%    Last Pain:  Vitals:   01/24/20 1345  TempSrc:   PainSc: 6                  Lewie Loron

## 2020-01-24 NOTE — Progress Notes (Signed)
Anthony Coffey to be D/C'd  per MD order. Discussed with the patient and all questions fully answered.  VSS, Skin clean, dry and intact without evidence of skin break down, no evidence of skin tears noted.  IV catheter discontinued intact. Site without signs and symptoms of complications. Dressing and pressure applied.  An After Visit Summary was printed and given to the patient. Patient received prescription.  D/c education completed with patient/family including follow up instructions, medication list, d/c activities limitations if indicated, with other d/c instructions as indicated by MD - patient able to verbalize understanding, all questions fully answered.   Patient instructed to return to ED, call 911, or call MD for any changes in condition.   Patient to be escorted via WC, and D/C home via private auto.

## 2020-01-24 NOTE — Discharge Instructions (Signed)
Orthopedic discharge instructions: - ok for full weight bearing as tolerated to the left leg  - maintain dressings until follow up.  If they become saturated, remove and cover with dry dressing, once per day  - ok to shower on day 3 after surgery, but do NOT submerge under water  - for prevention of blood clots resume your preoperative Plavix  - for mild to moderate pain use tylenol and advil alternating every 6 hours and 7.5mg  Norco for breakthrough pain (this has been sent to your pharmacy).  - follow up with Dr. Aundria Rud in 2 weeks

## 2020-01-24 NOTE — Progress Notes (Signed)
Pt will resume plavix for post op DVT ppx DC pain meds have been prescribed via our office EMR (Norco 7.5mg  q 4-6 hrs prn)

## 2020-01-24 NOTE — Progress Notes (Signed)
Occupational Therapy Treatment Patient Details Name: Anthony Coffey MRN: [id number] DOB: Sep 19, 1956 Today's Date: 01/24/2020    History of present illness 64 yo male presented with L hip fracture after recent mechanical fall (Pt tripped and fell about 6 feet down some steps). s/p IM nail on 3/27. PMHx: coronary artery disease, hypertension, nicotine dependence.    OT comments  PTA, pt was living with his mother and was independent. Currently, pt requires Supervision-Min Guard A for ADLs and functional mobility using RW. Provided education on compensatory techniques for LB ADLs (AE for donning of socks), toilet transfer, and tub transfer with 3N1; pt demonstrated understanding. Answered all pt questions. Recommend dc home once medically stable per physician. All acute OT needs met and will sign off. Thank you.   Follow Up Recommendations  No OT follow up;Supervision - Intermittent    Equipment Recommendations  None recommended by OT    Recommendations for Other Services PT consult    Precautions / Restrictions Precautions Precautions: Fall Restrictions Weight Bearing Restrictions: Yes LLE Weight Bearing: Weight bearing as tolerated Other Position/Activity Restrictions: WBAT       Mobility Bed Mobility Overal bed mobility: Modified Independent             General bed mobility comments: no physical assist required, increased time to get EOB  Transfers Overall transfer level: Needs assistance Equipment used: Rolling walker (2 wheeled) Transfers: Sit to/from Stand Sit to Stand: Supervision         General transfer comment: supervision for safety    Balance Overall balance assessment: Needs assistance Sitting-balance support: No upper extremity supported Sitting balance-Leahy Scale: Normal     Standing balance support: Bilateral upper extremity supported Standing balance-Leahy Scale: Fair                             ADL either performed or assessed  with clinical judgement   ADL Overall ADL's : Needs assistance/impaired Eating/Feeding: Independent;Sitting   Grooming: Set up;Sitting   Upper Body Bathing: Set up;Sitting   Lower Body Bathing: Supervison/ safety;Set up;Sit to/from stand   Upper Body Dressing : Set up;Sitting   Lower Body Dressing: Supervision/safety;Set up;Sit to/from stand;Cueing for compensatory techniques;With adaptive equipment Lower Body Dressing Details (indicate cue type and reason): Supervision for safety. Provided education on AE for donning socks. Educated on donning LLE into pants first Toilet Transfer: Supervision/safety;Ambulation;RW(simulated to recliner)       Tub/ Shower Transfer: Min guard;Ambulation;3 in 1 Tub/Shower Transfer Details (indicate cue type and reason): Educating pt on safe tub transfer techniques and use of 3N1. Pt demonstrating understanding with Min Guard A and cues. Provided handout to increase carry over.  Functional mobility during ADLs: Supervision/safety;Rolling walker General ADL Comments: Pt performing ADLs and functional mobility at [facility] A level. Pt with limitations to don socks due to pain with forward bending. Provided pt with sock aide and education on sock aide. Pt demonstrating understanding.      Vision       Perception     Praxis      Cognition Arousal/Alertness: Awake/alert Behavior During Therapy: WFL for tasks assessed/performed Overall Cognitive Status: Within Functional Limits for tasks assessed                                 General Comments: Very motivated to participate        Exercises  Shoulder Instructions       General Comments VSS    Pertinent Vitals/ Pain       Pain Assessment: Faces Faces Pain Scale: Hurts a little bit Pain Location: LLE Pain Descriptors / Indicators: Aching;Operative site guarding;Grimacing;Guarding Pain Intervention(s): Monitored during session;Limited activity within patient's  tolerance;Repositioned  Home Living Family/patient expects to be discharged to:: Private residence Living Arrangements: Parent Available Help at Discharge: Family Type of Home: House Home Access: Stairs to enter Technical brewer of Steps: 5 Entrance Stairs-Rails: Right Home Layout: One level;Other (Comment)(1step into front)     Bathroom Shower/Tub: Tub/shower unit;Curtain   Bathroom Toilet: Standard Bathroom Accessibility: No   Home Equipment: Walker - 2 wheels;Bedside commode;Hand held shower head          Prior Functioning/Environment Level of Independence: Independent        Comments: Retired,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, cook, medicine)   Frequency           Progress Toward Goals  OT Goals(current goals can now be found in the care plan section)     Acute Rehab OT Goals Patient Stated Goal: Return home OT Goal Formulation: All assessment and education complete, DC therapy  Plan      Co-evaluation                 AM-PAC OT "6 Clicks" Daily Activity     Outcome Measure   Help from another person eating meals?: None Help from another person taking care of personal grooming?: None Help from another person toileting, which includes using toliet, bedpan, or urinal?: A Little Help from another person bathing (including washing, rinsing, drying)?: A Little Help from another person to put on and taking off regular upper body clothing?: None Help from another person to put on and taking off regular lower body clothing?: A Little 6 Click Score: 21    End of Session Equipment Utilized During Treatment: Rolling walker  OT Visit Diagnosis: Unsteadiness on feet (R26.81);Other abnormalities of gait and mobility (R26.89);Muscle weakness (generalized) (M62.81);Pain Pain - Right/Left: Left Pain - part of body: Leg   Activity Tolerance Patient tolerated treatment well   Patient Left in chair;with call bell/phone within reach   Nurse  Communication Mobility status        Time: 2706-2376 OT Time Calculation (min): 23 min  Charges: OT General Charges $OT Visit: 1 Visit OT Evaluation $OT Eval Low Complexity: 1 Low OT Treatments $Self Care/Home Management : 8-22 mins  Anthony Coffey MSOT, OTR/L Acute Rehab Pager: [phone] Office: [place] 01/24/2020, 3:27 PM

## 2020-01-25 ENCOUNTER — Encounter: Payer: Self-pay | Admitting: *Deleted

## 2020-03-14 ENCOUNTER — Other Ambulatory Visit: Payer: Self-pay

## 2020-03-14 ENCOUNTER — Encounter: Payer: Self-pay | Admitting: *Deleted

## 2020-03-14 DIAGNOSIS — Z006 Encounter for examination for normal comparison and control in clinical research program: Secondary | ICD-10-CM

## 2020-03-14 NOTE — Research (Signed)
Patient to research clinic for randomization visit in the [facility] Research trial.  No cos, aes or saes to report.  Subject received randomization injection and next appointment scheduled.

## 2020-06-13 ENCOUNTER — Encounter: Payer: Self-pay | Admitting: *Deleted

## 2020-06-13 ENCOUNTER — Other Ambulatory Visit: Payer: Self-pay

## 2020-06-13 DIAGNOSIS — Z006 Encounter for examination for normal comparison and control in clinical research program: Secondary | ICD-10-CM

## 2020-06-13 NOTE — Research (Signed)
Pt to research clinic for Orion 4 visit 3. No AE/SAE or medication changes to report. Injection given. Pt tolerated well. Next appointment scheduled for 12/12/2020 at 0900

## 2020-06-22 ENCOUNTER — Encounter: Payer: Self-pay | Admitting: *Deleted

## 2020-06-22 DIAGNOSIS — Z006 Encounter for examination for normal comparison and control in clinical research program: Secondary | ICD-10-CM

## 2021-01-18 ENCOUNTER — Telehealth: Payer: Self-pay | Admitting: *Deleted

## 2021-01-18 NOTE — Telephone Encounter (Signed)
Tried to get in touch with subject to remind him of his scheduled appointment for the [facility] for tomorrow 01/19/2021. Unable to get through to leave a voicemail after calling multiple times.

## 2021-02-21 ENCOUNTER — Telehealth: Payer: Self-pay | Admitting: *Deleted

## 2021-02-21 NOTE — Telephone Encounter (Signed)
Tried to get in touch with Anthony Coffey Patient in regards to coming into the Research Clinic for his follow-up appointment for the study. Unable to get in touch with him and unable to leave voicemail on his phone after multiple attempts. Will continue to try to reach patient.

## 2021-07-16 DIAGNOSIS — Z006 Encounter for examination for normal comparison and control in clinical research program: Secondary | ICD-10-CM

## 2021-07-16 NOTE — Research (Signed)
Multiple attempt to contact patient without response. Certified letter sent out today

## 2022-02-24 ENCOUNTER — Emergency Department (HOSPITAL_COMMUNITY)
Admission: EM | Admit: 2022-02-24 | Discharge: 2022-02-24 | Disposition: A | Discharge status: Home / Self Care | Payer: Self-pay | Attending: Emergency Medicine | Admitting: Emergency Medicine

## 2022-02-24 ENCOUNTER — Emergency Department (HOSPITAL_COMMUNITY): Payer: Self-pay

## 2022-02-24 ENCOUNTER — Other Ambulatory Visit: Payer: Self-pay

## 2022-02-24 ENCOUNTER — Encounter (HOSPITAL_COMMUNITY): Payer: Self-pay | Admitting: Emergency Medicine

## 2022-02-24 DIAGNOSIS — I2510 Atherosclerotic heart disease of native coronary artery without angina pectoris: Secondary | ICD-10-CM | POA: Insufficient documentation

## 2022-02-24 DIAGNOSIS — Z79899 Other long term (current) drug therapy: Secondary | ICD-10-CM | POA: Insufficient documentation

## 2022-02-24 DIAGNOSIS — J449 Chronic obstructive pulmonary disease, unspecified: Secondary | ICD-10-CM | POA: Insufficient documentation

## 2022-02-24 DIAGNOSIS — R1013 Epigastric pain: Secondary | ICD-10-CM | POA: Insufficient documentation

## 2022-02-24 DIAGNOSIS — K269 Duodenal ulcer, unspecified as acute or chronic, without hemorrhage or perforation: Secondary | ICD-10-CM

## 2022-02-24 DIAGNOSIS — Z7902 Long term (current) use of antithrombotics/antiplatelets: Secondary | ICD-10-CM | POA: Insufficient documentation

## 2022-02-24 DIAGNOSIS — R748 Abnormal levels of other serum enzymes: Secondary | ICD-10-CM | POA: Insufficient documentation

## 2022-02-24 DIAGNOSIS — I10 Essential (primary) hypertension: Secondary | ICD-10-CM | POA: Insufficient documentation

## 2022-02-24 DIAGNOSIS — R110 Nausea: Secondary | ICD-10-CM | POA: Insufficient documentation

## 2022-02-24 DIAGNOSIS — R531 Weakness: Secondary | ICD-10-CM | POA: Insufficient documentation

## 2022-02-24 LAB — LIPASE, BLOOD: Lipase: 69 U/L — ABNORMAL HIGH (ref 11–51)

## 2022-02-24 LAB — COMPREHENSIVE METABOLIC PANEL
ALT: 21 U/L (ref 0–44)
AST: 23 U/L (ref 15–41)
Albumin: 4.2 g/dL (ref 3.5–5.0)
Alkaline Phosphatase: 64 U/L (ref 38–126)
Anion gap: 9 (ref 5–15)
BUN: <5 mg/dL — ABNORMAL LOW (ref 8–23)
CO2: 21 mmol/L — ABNORMAL LOW (ref 22–32)
Calcium: 9.6 mg/dL (ref 8.9–10.3)
Chloride: 106 mmol/L (ref 98–111)
Creatinine, Ser: 0.86 mg/dL (ref 0.61–1.24)
GFR, Estimated: >60 mL/min (ref >60)
Glucose, Bld: 99 mg/dL (ref 70–99)
Potassium: 4.2 mmol/L (ref 3.5–5.1)
Sodium: 136 mmol/L (ref 135–145)
Total Bilirubin: 0.5 mg/dL (ref 0.3–1.2)
Total Protein: 7 g/dL (ref 6.5–8.1)

## 2022-02-24 LAB — URINALYSIS, ROUTINE W REFLEX MICROSCOPIC
Bacteria, UA: NONE SEEN
Bilirubin Urine: NEGATIVE
Glucose, UA: NEGATIVE mg/dL
Ketones, ur: NEGATIVE mg/dL
Leukocytes,Ua: NEGATIVE
Nitrite: NEGATIVE
Protein, ur: NEGATIVE mg/dL
Specific Gravity, Urine: 1.008 (ref 1.005–1.030)
pH: 7 (ref 5.0–8.0)

## 2022-02-24 LAB — CBC
HCT: 47.6 % (ref 39.0–52.0)
Hemoglobin: 16.1 g/dL (ref 13.0–17.0)
MCH: 30.3 pg (ref 26.0–34.0)
MCHC: 33.8 g/dL (ref 30.0–36.0)
MCV: 89.6 fL (ref 80.0–100.0)
Platelets: 332 10*3/uL (ref 150–400)
RBC: 5.31 MIL/uL (ref 4.22–5.81)
RDW: 13.2 % (ref 11.5–15.5)
WBC: 7.8 10*3/uL (ref 4.0–10.5)
nRBC: 0 % (ref 0.0–0.2)

## 2022-02-24 LAB — TROPONIN I (HIGH SENSITIVITY)
Troponin I (High Sensitivity): 11 ng/L (ref <18)
Troponin I (High Sensitivity): 9 ng/L (ref <18)

## 2022-02-24 MED ORDER — FAMOTIDINE IN NACL 20-0.9 MG/50ML-% IV SOLN
20.0000 mg | Freq: Once | INTRAVENOUS | Status: AC
  Administered 2022-02-24: 20 mg via INTRAVENOUS
  Filled 2022-02-24: qty 50

## 2022-02-24 MED ORDER — PANTOPRAZOLE SODIUM 20 MG PO TBEC: 20.0000 mg | DELAYED_RELEASE_TABLET | Freq: Every day | ORAL | 0 refills | Status: DC

## 2022-02-24 MED ORDER — MORPHINE SULFATE (PF) 4 MG/ML IV SOLN
4.0000 mg | Freq: Once | INTRAVENOUS | Status: AC
  Administered 2022-02-24: 4 mg via INTRAVENOUS
  Filled 2022-02-24: qty 1

## 2022-02-24 MED ORDER — ONDANSETRON HCL 4 MG/2ML IJ SOLN
4.0000 mg | Freq: Once | INTRAMUSCULAR | Status: AC
  Administered 2022-02-24: 4 mg via INTRAVENOUS
  Filled 2022-02-24: qty 2

## 2022-02-24 MED ORDER — MAALOX MAX 400-400-40 MG/5ML PO SUSP: 5.0000 mL | Freq: Four times a day (QID) | ORAL | 0 refills | Status: DC | PRN

## 2022-02-24 MED ORDER — ALUM & MAG HYDROXIDE-SIMETH 200-200-20 MG/5ML PO SUSP
30.0000 mL | Freq: Once | ORAL | Status: AC
  Administered 2022-02-24: 30 mL via ORAL
  Filled 2022-02-24: qty 30

## 2022-02-24 MED ORDER — IOHEXOL 350 MG/ML SOLN
100.0000 mL | Freq: Once | INTRAVENOUS | Status: AC | PRN
  Administered 2022-02-24: 199 mL via INTRAVENOUS

## 2022-02-24 MED ORDER — SODIUM CHLORIDE 0.9 % IV BOLUS
500.0000 mL | Freq: Once | INTRAVENOUS | Status: AC
  Administered 2022-02-24: 500 mL via INTRAVENOUS

## 2022-02-24 MED ORDER — LIDOCAINE VISCOUS HCL 2 % MT SOLN
15.0000 mL | Freq: Once | OROMUCOSAL | Status: AC
  Administered 2022-02-24: 15 mL via ORAL
  Filled 2022-02-24: qty 15

## 2022-02-24 NOTE — ED Provider Notes (Signed)
[facility] EMERGENCY DEPARTMENT Provider Note   CSN: [id number] Arrival date & time: 02/24/22  1003     History  Chief Complaint  Patient presents with   Weakness    Epigastric pain    Anthony Coffey is a 66 y.o. male.  HPI  66 year old male with past medical history of ruptured AAA status post vascular repair, CAD, COPD, HTN presents to the emergency department with concern for epigastric/mid abdominal pain.  Patient states this has been bothering him intermittently for the past 3 days.  Certain movements and palpation makes the pain worse.  He has had associated nausea but denies any vomiting or diarrhea.  No fever.  Currently no chest/back pain or difficulty breathing. No genitourinary symptoms.   Home Medications Prior to Admission medications   Medication Sig Start Date End Date Taking? Authorizing Provider  acetaminophen (TYLENOL) 500 MG tablet Take 1 tablet (500 mg total) by mouth every 6 (six) hours as needed for headache (pain). 02/04/17   Arty Baumgartner, NP  atorvastatin (LIPITOR) 80 MG tablet Take 1 tablet (80 mg total) by mouth daily. 11/16/19 01/22/20  Lyn Records, MD  clopidogrel (PLAVIX) 75 MG tablet Take 1 tablet (75 mg total) by mouth daily. 01/24/20   Jerald Kief, MD  fenofibrate 160 MG tablet Take 1 tablet (160 mg total) by mouth daily. 11/16/19   Lyn Records, MD  lisinopril (ZESTRIL) 10 MG tablet Take 1 tablet (10 mg total) by mouth daily. 09/28/19   Marjie Skiff E, PA-C  metoprolol tartrate (LOPRESSOR) 25 MG tablet Take 1 tablet (25 mg total) by mouth 2 (two) times daily. 09/28/19   Marjie Skiff E, PA-C  nitroGLYCERIN (NITROSTAT) 0.4 MG SL tablet Place 1 tablet (0.4 mg total) under the tongue every 5 (five) minutes as needed for chest pain. 01/11/20   Lyn Records, MD  Omega-3 Fatty Acids (FISH OIL) 1000 MG CAPS Take 4 capsules (4,000 mg total) by mouth daily. 07/03/17   Lyn Records, MD  Study - ORION 4 - inclisiran 300 mg/1.85mL  or placebo SQ injection (PI-Stuckey) Inject 300 mg into the skin every 6 (six) months. 01/19/20   [provider]      Allergies    Patient has no known allergies.    Review of Systems   Review of Systems  Constitutional:  Negative for fever.  Respiratory:  Negative for chest tightness and shortness of breath.   Cardiovascular:  Negative for chest pain, palpitations and leg swelling.  Gastrointestinal:  Positive for abdominal pain and nausea. Negative for diarrhea and vomiting.  Genitourinary:  Negative for flank pain.  Musculoskeletal:  Negative for back pain and neck pain.  Skin:  Negative for rash.  Neurological:  Negative for headaches.   Physical Exam Updated Vital Signs BP (!) 160/99 (BP Location: Right Arm)   Pulse 62   Temp 99.1 F (37.3 C) (Oral)   Resp 12   SpO2 99%  Physical Exam Vitals and nursing note reviewed.  Constitutional:      Appearance: Normal appearance.  HENT:     Head: Normocephalic.     Mouth/Throat:     Mouth: Mucous membranes are moist.  Cardiovascular:     Rate and Rhythm: Normal rate.  Pulmonary:     Effort: Pulmonary effort is normal. No respiratory distress.  Abdominal:     Tenderness: There is abdominal tenderness. There is guarding.     Comments: Mid and epigastric abdominal TTP with  voluntary guarding  Skin:    General: Skin is warm.  Neurological:     Mental Status: He is alert and oriented to person, place, and time. Mental status is at baseline.  Psychiatric:        Mood and Affect: Mood normal.    ED Results / Procedures / Treatments   Labs (all labs ordered are listed, but only abnormal results are displayed) Labs Reviewed  LIPASE, BLOOD - Abnormal; Notable for the following components:      Result Value   Lipase 69 (*)    All other components within normal limits  COMPREHENSIVE METABOLIC PANEL - Abnormal; Notable for the following components:   CO2 21 (*)    BUN <5 (*)    All other components within normal  limits  CBC  URINALYSIS, ROUTINE W REFLEX MICROSCOPIC  TROPONIN I (HIGH SENSITIVITY)  TROPONIN I (HIGH SENSITIVITY)    EKG EKG Interpretation  Date/Time:  Sunday February 24 2022 10:32:02 EDT Ventricular Rate:  72 PR Interval:  156 QRS Duration: 90 QT Interval:  390 QTC Calculation: 427 R Axis:   115 Text Interpretation: Normal sinus rhythm Left posterior fascicular block Abnormal ECG When compared with ECG of 22-Jan-2020 00:56, PREVIOUS ECG IS PRESENT Confirmed by Coralee Pesa [phone]) on 02/24/2022 11:44:39 AM  Radiology DG Chest 2 View  Result Date: 02/24/2022 CLINICAL DATA:  Epigastric pain EXAM: CHEST - 2 VIEW COMPARISON:  01/22/2020 FINDINGS: The heart size and mediastinal contours are within normal limits. Both lungs are clear. No pleural effusion or pneumothorax. The visualized skeletal structures are unremarkable. IMPRESSION: No active cardiopulmonary disease. Electronically Signed   By: Guadlupe Spanish M.D.   On: 02/24/2022 11:24    Procedures Procedures    Medications Ordered in ED Medications  famotidine (PEPCID) IVPB 20 mg premix (20 mg Intravenous New Bag/Given 02/24/22 1352)  sodium chloride 0.9 % bolus 500 mL (500 mLs Intravenous New Bag/Given 02/24/22 1350)  morphine (PF) 4 MG/ML injection 4 mg (4 mg Intravenous Given 02/24/22 1353)  ondansetron (ZOFRAN) injection 4 mg (4 mg Intravenous Given 02/24/22 1353)    ED Course/ Medical Decision Making/ A&P                           Medical Decision Making Amount and/or Complexity of Data Reviewed Labs: ordered. Radiology: ordered.  Risk OTC drugs. Prescription drug management.   This patient presents to the ED for concern of epigastric/abdominal pain, this involves an extensive number of treatment options, and is a complaint that carries with it a high risk of complications and morbidity.  The differential diagnosis includes gastritis, abdominal infection, AAA complication   Additional history  obtained: -External records from outside source obtained and reviewed including: Chart review including previous notes, labs, imaging, consultation notes   Lab Tests: -I ordered, reviewed, and interpreted labs.  The pertinent results include: Mildly elevated lipase   Imaging Studies ordered: -I ordered imaging studies including CTA of the chest abdomen pelvis -I independently visualized and interpreted imaging which showed unchanged AAA with repair and other continued vascular abnormality findings with findings of duodenal ulcer without rupture -I agree with the radiologist interpretation   Medicines ordered and prescription drug management: -I ordered medication including pain medicine/IV medicine for symptoms -Reevaluation of the patient after these medicines showed that the patient improved -I have reviewed the patients home medicines and have made adjustments as needed   ED Course: 66 year old male with past medical  history of AAA status post rupture and repair presents emergency department with epigastric/abdominal pain.  Patient is slightly hypertensive on arrival but otherwise normal vitals.  He has a tender mid abdomen with guarding, no distention.  Concern for vascular complication given his significant history.  Blood work shows mildly elevated lipase of 69 but otherwise unremarkable.  CTA of the chest abdomen and pelvis shows no vascular complications.  He has an intact repair with other continued/chronic vascular findings that he will follow-up as an outpatient for.  There is a new finding of a duodenal ulcer,findings of perforation.  Plan to refer to gastroenterology for ambulatory evaluation/further testing/endoscopy.  We will treat him otherwise as peptic ulcer disease and educate.  After medications patient feels significantly improved, has been able to p.o., abdomen is soft and improved. BLE appear baseline vascularly in tact.   Cardiac Monitoring: The patient was maintained  on a cardiac monitor.  I personally viewed and interpreted the cardiac monitored which showed an underlying rhythm of: Sinus   Reevaluation: After the interventions noted above, I reevaluated the patient and found that they have :improved   Dispostion: Patient at this time appears safe and stable for discharge and close outpatient follow up. Discharge plan and strict return to ED precautions discussed, patient verbalizes understanding and agreement.        Final Clinical Impression(s) / ED Diagnoses Final diagnoses:  None    Rx / DC Orders ED Discharge Orders     None         Rozelle Logan, DO 02/24/22 1901

## 2022-02-24 NOTE — ED Notes (Signed)
After giving po food/fluid pt states "it doesn't feel to bad, maybe just a little pain but like it was before"  ?

## 2022-02-24 NOTE — Discharge Instructions (Signed)
You have been seen and discharged from the emergency department.  Your work-up was significant for what appears to be a duodenal ulcer.  An ambulatory referral to gastroenterology has been placed.  Follow-up with gastroenterology for further evaluation and testing.  Take new medication as prescribed.  Follow a more bland diet as acidic/spicy foods can irritate the stomach.  Follow-up with your primary provider for further evaluation and further care. Take home medications as prescribed. If you have any worsening symptoms, severely worsening abdominal pain, abdominal bloating, inability to eat/drink or further concerns for your health please return to an emergency department for further evaluation. ?

## 2022-02-24 NOTE — ED Triage Notes (Signed)
Pt to triage via [facility] from home.  C/o epigastric pain x 3 days that increases with palpation and movement.  Also reports generalized weakness. ? ?18g LFA ?

## 2022-05-21 ENCOUNTER — Encounter: Payer: Self-pay | Admitting: Emergency Medicine

## 2022-09-17 ENCOUNTER — Telehealth: Payer: Self-pay | Admitting: *Deleted

## 2022-09-17 NOTE — Telephone Encounter (Signed)
Orion 4 LMTCB   Need to schedule follow appt

## 2023-02-12 ENCOUNTER — Emergency Department (HOSPITAL_COMMUNITY): Payer: Self-pay

## 2023-02-12 ENCOUNTER — Encounter (HOSPITAL_COMMUNITY): Payer: Self-pay | Admitting: Emergency Medicine

## 2023-02-12 ENCOUNTER — Other Ambulatory Visit: Payer: Self-pay

## 2023-02-12 ENCOUNTER — Emergency Department (HOSPITAL_COMMUNITY)
Admission: EM | Admit: 2023-02-12 | Discharge: 2023-02-12 | Disposition: A | Discharge status: Home / Self Care | Payer: Self-pay | Attending: Emergency Medicine | Admitting: Emergency Medicine

## 2023-02-12 DIAGNOSIS — F17200 Nicotine dependence, unspecified, uncomplicated: Secondary | ICD-10-CM | POA: Insufficient documentation

## 2023-02-12 DIAGNOSIS — K2900 Acute gastritis without bleeding: Secondary | ICD-10-CM | POA: Insufficient documentation

## 2023-02-12 DIAGNOSIS — I10 Essential (primary) hypertension: Secondary | ICD-10-CM | POA: Insufficient documentation

## 2023-02-12 DIAGNOSIS — Z7901 Long term (current) use of anticoagulants: Secondary | ICD-10-CM | POA: Insufficient documentation

## 2023-02-12 DIAGNOSIS — I70209 Unspecified atherosclerosis of native arteries of extremities, unspecified extremity: Secondary | ICD-10-CM | POA: Insufficient documentation

## 2023-02-12 DIAGNOSIS — Z1152 Encounter for screening for COVID-19: Secondary | ICD-10-CM | POA: Insufficient documentation

## 2023-02-12 DIAGNOSIS — I2510 Atherosclerotic heart disease of native coronary artery without angina pectoris: Secondary | ICD-10-CM | POA: Insufficient documentation

## 2023-02-12 DIAGNOSIS — J449 Chronic obstructive pulmonary disease, unspecified: Secondary | ICD-10-CM | POA: Insufficient documentation

## 2023-02-12 LAB — COMPREHENSIVE METABOLIC PANEL
ALT: 19 U/L (ref 0–44)
AST: 25 U/L (ref 15–41)
Albumin: 4.3 g/dL (ref 3.5–5.0)
Alkaline Phosphatase: 58 U/L (ref 38–126)
Anion gap: 16 — ABNORMAL HIGH (ref 5–15)
BUN: 6 mg/dL — ABNORMAL LOW (ref 8–23)
CO2: 18 mmol/L — ABNORMAL LOW (ref 22–32)
Calcium: 9.7 mg/dL (ref 8.9–10.3)
Chloride: 106 mmol/L (ref 98–111)
Creatinine, Ser: 0.9 mg/dL (ref 0.61–1.24)
GFR, Estimated: >60 mL/min (ref >60)
Glucose, Bld: 119 mg/dL — ABNORMAL HIGH (ref 70–99)
Potassium: 4.1 mmol/L (ref 3.5–5.1)
Sodium: 140 mmol/L (ref 135–145)
Total Bilirubin: 0.7 mg/dL (ref 0.3–1.2)
Total Protein: 7.2 g/dL (ref 6.5–8.1)

## 2023-02-12 LAB — URINALYSIS, ROUTINE W REFLEX MICROSCOPIC
Bacteria, UA: NONE SEEN
Bilirubin Urine: NEGATIVE
Glucose, UA: NEGATIVE mg/dL
Ketones, ur: NEGATIVE mg/dL
Leukocytes,Ua: NEGATIVE
Nitrite: NEGATIVE
Protein, ur: NEGATIVE mg/dL
Specific Gravity, Urine: 1.021 (ref 1.005–1.030)
pH: 7 (ref 5.0–8.0)

## 2023-02-12 LAB — CBC
HCT: 45.5 % (ref 39.0–52.0)
Hemoglobin: 16.4 g/dL (ref 13.0–17.0)
MCH: 31.1 pg (ref 26.0–34.0)
MCHC: 36 g/dL (ref 30.0–36.0)
MCV: 86.3 fL (ref 80.0–100.0)
Platelets: 293 10*3/uL (ref 150–400)
RBC: 5.27 MIL/uL (ref 4.22–5.81)
RDW: 12.9 % (ref 11.5–15.5)
WBC: 7.8 10*3/uL (ref 4.0–10.5)
nRBC: 0 % (ref 0.0–0.2)

## 2023-02-12 LAB — LACTIC ACID, PLASMA: Lactic Acid, Venous: 2 mmol/L (ref 0.5–1.9)

## 2023-02-12 LAB — LIPASE, BLOOD: Lipase: 53 U/L — ABNORMAL HIGH (ref 11–51)

## 2023-02-12 MED ORDER — ALUM & MAG HYDROXIDE-SIMETH 200-200-20 MG/5ML PO SUSP
30.0000 mL | Freq: Once | ORAL | Status: AC
  Administered 2023-02-12: 30 mL via ORAL
  Filled 2023-02-12: qty 30

## 2023-02-12 MED ORDER — LACTATED RINGERS IV BOLUS
1000.0000 mL | Freq: Once | INTRAVENOUS | Status: AC
  Administered 2023-02-12: 1000 mL via INTRAVENOUS

## 2023-02-12 MED ORDER — FENTANYL CITRATE PF 50 MCG/ML IJ SOSY
25.0000 ug | PREFILLED_SYRINGE | Freq: Once | INTRAMUSCULAR | Status: AC
  Administered 2023-02-12: 25 ug via INTRAVENOUS
  Filled 2023-02-12: qty 1

## 2023-02-12 MED ORDER — IOHEXOL 350 MG/ML SOLN
75.0000 mL | Freq: Once | INTRAVENOUS | Status: AC | PRN
  Administered 2023-02-12: 75 mL via INTRAVENOUS

## 2023-02-12 MED ORDER — LIDOCAINE VISCOUS HCL 2 % MT SOLN
15.0000 mL | Freq: Once | OROMUCOSAL | Status: AC
  Administered 2023-02-12: 15 mL via ORAL
  Filled 2023-02-12: qty 15

## 2023-02-12 MED ORDER — PANTOPRAZOLE SODIUM 40 MG IV SOLR
40.0000 mg | Freq: Once | INTRAVENOUS | Status: AC
  Administered 2023-02-12: 40 mg via INTRAVENOUS
  Filled 2023-02-12: qty 10

## 2023-02-12 MED ORDER — ACETAMINOPHEN 500 MG PO TABS
1000.0000 mg | ORAL_TABLET | Freq: Once | ORAL | Status: AC
  Administered 2023-02-12: 1000 mg via ORAL
  Filled 2023-02-12: qty 2

## 2023-02-12 NOTE — ED Notes (Signed)
Lactic 2.0 called from lab ED provider Jearld Fenton notified

## 2023-02-12 NOTE — ED Provider Notes (Signed)
[place] EMERGENCY DEPARTMENT AT [facility] Provider Note   CSN: [id number] Arrival date & time: 02/12/23  8657     History  Chief Complaint  Patient presents with   Abdominal Pain    Anthony Coffey is a 67 y.o. 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  Patient w/ epigastric/central abdominal pain that started yesterday. Radiates to back. Endorses nausea w/ no vomiting. Felt generally weak. Also w/ fevers/chills. Took tylenol this AM with no relief. Couldn't sleep last night it hurt so bad. Denies chest pain, shortness of breath, urinary symptoms, diarrhea/constipation, melena/hematochezia, numbness/tingling. Does not feel like prior heart attack or prior ruptured AAA or prior duodenal ulcer.  HPI     Home Medications Prior to Admission medications   Medication Sig Start Date End Date Taking? Authorizing Provider  acetaminophen (TYLENOL) 500 MG tablet Take 1 tablet (500 mg total) by mouth every 6 (six) hours as needed for headache (pain). 02/04/17   Arty Baumgartner, NP  alum & mag hydroxide-simeth (MAALOX MAX) 400-400-40 MG/5ML suspension Take 5 mLs by mouth every 6 (six) hours as needed for indigestion. 02/24/22   Horton, Clabe Seal, DO  atorvastatin (LIPITOR) 80 MG tablet Take 1 tablet (80 mg total) by mouth daily. 11/16/19 01/22/20  Lyn Records, MD  clopidogrel (PLAVIX) 75 MG tablet Take 1 tablet (75 mg total) by mouth daily. 01/24/20   Jerald Kief, MD  fenofibrate 160 MG tablet Take 1 tablet (160 mg total) by mouth daily. 11/16/19   Lyn Records, MD  lisinopril (ZESTRIL) 10 MG tablet Take 1 tablet (10 mg total) by mouth daily. 09/28/19   Marjie Skiff E, PA-C  metoprolol tartrate (LOPRESSOR) 25 MG tablet Take 1 tablet (25 mg total) by mouth 2 (two) times daily. 09/28/19   Marjie Skiff E, PA-C  nitroGLYCERIN (NITROSTAT) 0.4  MG SL tablet Place 1 tablet (0.4 mg total) under the tongue every 5 (five) minutes as needed for chest pain. 01/11/20   Lyn Records, MD  Omega-3 Fatty Acids (FISH OIL) 1000 MG CAPS Take 4 capsules (4,000 mg total) by mouth daily. 07/03/17   Lyn Records, MD  pantoprazole (PROTONIX) 20 MG tablet Take 1 tablet (20 mg total) by mouth daily. 02/24/22   Horton, Clabe Seal, DO  Study - ORION 4 - inclisiran 300 mg/1.56mL or placebo SQ injection (PI-Stuckey) Inject 300 mg into the skin every 6 (six) months. 01/19/20   [provider]      Allergies    Patient has no known allergies.    Review of Systems   Review of Systems Review of systems Positive for fevers/chills.  A 10 point review of systems was performed and is negative unless otherwise reported in HPI.  Physical Exam Updated Vital Signs BP (!) 173/89   Pulse 71   Temp 98.2 F (36.8 C) (Oral)   Resp 12   Ht 5\' 9"  (1.753 m)   Wt 81.6 kg   SpO2 100%   BMI 26.58 kg/m  Physical Exam General: Normal appearing male, lying in bed.  HEENT: PERRLA, Sclera anicteric, MMM, trachea midline.  Cardiology: RRR, no murmurs/rubs/gallops. BL radial and DP pulses equal bilaterally.  Resp: Normal respiratory rate and effort. CTAB, no wheezes, rhonchi, crackles.  Abd: TTP in LUQ, epigastric regions.  Soft,  non-distended. No rebound tenderness or guarding.  GU: Deferred.  MSK: No peripheral edema or signs of trauma. Extremities without deformity or TTP. No cyanosis or clubbing. Skin: warm, dry. No rashes or lesions. Back: No CVA tenderness Neuro: A&Ox4, CNs II-XII grossly intact. MAEs. Sensation grossly intact.  Psych: Normal mood and affect.   ED Results / Procedures / Treatments   Labs (all labs ordered are listed, but only abnormal results are displayed) Labs Reviewed  LIPASE, BLOOD - Abnormal; Notable for the following components:      Result Value   Lipase 53 (*)    All other components within normal limits  COMPREHENSIVE METABOLIC  PANEL - Abnormal; Notable for the following components:   CO2 18 (*)    Glucose, Bld 119 (*)    BUN 6 (*)    Anion gap 16 (*)    All other components within normal limits  LACTIC ACID, PLASMA - Abnormal; Notable for the following components:   Lactic Acid, Venous 2.0 (*)    All other components within normal limits  CBC  URINALYSIS, ROUTINE W REFLEX MICROSCOPIC  LACTIC ACID, PLASMA    EKG EKG Interpretation  Date/Time:  Wednesday February 12 2023 08:53:37 EDT Ventricular Rate:  68 PR Interval:  168 QRS Duration: 106 QT Interval:  411 QTC Calculation: 438 R Axis:   147 Text Interpretation: Sinus rhythm Consider right ventricular hypertrophy Confirmed by Vivi Barrack [phone]) on 02/12/2023 10:37:13 AM  Radiology CT Angio Abd/Pel W and/or Wo Contrast  Addendum Date: 02/12/2023   ADDENDUM REPORT: 02/12/2023 12:31 ADDENDUM: In the findings section under liver, please state the liver has a nodular contour. Please correlate for any evidence of liver disease. Electronically Signed   By: Karen Kays M.D.   On: 02/12/2023 12:31   Result Date: 02/12/2023 CLINICAL DATA:  Abdominal aortic aneurysm EXAM: CTA ABDOMEN AND PELVIS WITHOUT AND WITH CONTRAST TECHNIQUE: Multidetector CT imaging of the abdomen and pelvis was performed using the standard protocol during bolus administration of intravenous contrast. Multiplanar reconstructed images and MIPs were obtained and reviewed to evaluate the vascular anatomy. RADIATION DOSE REDUCTION: This exam was performed according to the departmental dose-optimization program which includes automated exposure control, adjustment of the mA and/or kV according to patient size and/or use of iterative reconstruction technique. CONTRAST:  75mL OMNIPAQUE IOHEXOL 350 MG/ML SOLN COMPARISON:  02/24/2022 CT angiogram FINDINGS: VASCULAR Aorta: Once again the patient is status post infrarenal abdominal aortic aneurysm repair. Diameter previously was measured at 3.5 x 3.2 cm and  today when measured in the same fashion at this same level for continuity, the aorta would have diameter 3.5 by 3.2 cm. Moderate mural plaque/thrombus. Some of the areas are slightly irregular. No new areas of aneurysmal dilatation of the aorta. No dissection. No retroperitoneal hematoma. Celiac: Patent without evidence of aneurysm, dissection, vasculitis or significant stenosis. SMA: Mild atherosclerotic plaque.  No significant stenosis. Renals: Single bilateral main renal arteries with some mild calcified atherosclerotic plaque. No significant stenosis. IMA: Not seen.  Collaterals are noted. Inflow: There is dilatation of both common iliac arteries. The right has a diameter approaching 2.4 cm and left 2.7 cm, similar to previous. There is plaque as well along the common iliac arteries distally at the bifurcation extending along the origin of the intrarenal external iliac vessels. There is high-grade stenosis seen of the left common iliac artery origin and moderate right. Mild stenosis of the external iliac artery origins. The external iliac arteries otherwise have a normal course and caliber with mild atherosclerotic disease. Proximal Outflow: Dilatation of  the common femoral arteries. On the left measuring up to 13 mm and right 18 mm. Please correlate with any prior surgical intervention. Just distal to the left area dilatation has a focal severe stenosis best seen on coronal series 8, image 68. This is worrisome for a high-grade stenosis. Additional area dilatation just distal to this with again other area of narrowing. Please correlate with any particular symptoms and further workup when clinically appropriate. Veins: No obvious venous abnormality within the limitations of this arterial phase study. Review of the MIP images confirms the above findings. NON-VASCULAR Lower chest: Mild linear opacity lung bases likely scar or atelectasis. No pleural effusion. Hepatobiliary: Fatty liver infiltration.  Gallbladder  is nondilated. Pancreas: Unremarkable. No pancreatic ductal dilatation or surrounding inflammatory changes. Spleen: Normal in size without focal abnormality. Adrenals/Urinary Tract: The adrenal glands are unremarkable. No enhancing renal mass or collecting system dilatation. There is a simple Bosniak 1 left-sided renal cyst, unchanged from previous. No specific imaging follow-up. Preserved contours of the urinary bladder. Stomach/Bowel: Large bowel has a normal course and caliber with scattered colonic stool. Normal appendix. Fluid in the stomach. Small bowel is nondilated. Lymphatic: No specific abnormal lymph node enlargement identified in the abdomen and pelvis Reproductive: Enlarged prostate with mass effect along the base of the bladder Other: Small fat containing inguinal hernias and umbilical hernia. Musculoskeletal: Scattered degenerative changes of the spine and pelvis. Trace anterolisthesis of L3 on L4. Multilevel spinal stenosis. There is also streak artifact related to the patient's left dynamic hip screw. IMPRESSION: VASCULAR Stable surgical changes of infrarenal abdominal aortic aneurysm repair. Stable diameter of the aorta measuring up to 3.5 cm. 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 In addition there are areas of dilatation of the common femoral arteries. Please correlate with any previous intervention including endarterectomy. There is also however a focal high-grade stenosis seen at the left common femoral artery bifurcation. Please correlate with any particular symptoms. Dedicated evaluation as clinically appropriate. NON-VASCULAR No bowel obstruction, free air or free fluid. Electronically Signed: By: Karen Kays M.D. On: 02/12/2023 12:18    Procedures Procedures    Medications Ordered in ED Medications  fentaNYL (SUBLIMAZE) injection 25 mcg (25 mcg Intravenous Given 02/12/23 1015)  lactated ringers bolus 1,000 mL (0 mLs Intravenous Stopped 02/12/23 1621)  iohexol  (OMNIPAQUE) 350 MG/ML injection 75 mL (75 mLs Intravenous Contrast Given 02/12/23 1202)  alum & mag hydroxide-simeth (MAALOX/MYLANTA) 200-200-20 MG/5ML suspension 30 mL (30 mLs Oral Given 02/12/23 1315)    And  lidocaine (XYLOCAINE) 2 % viscous mouth solution 15 mL (15 mLs Oral Given 02/12/23 1315)  pantoprazole (PROTONIX) injection 40 mg (40 mg Intravenous Given 02/12/23 1315)  acetaminophen (TYLENOL) tablet 1,000 mg (1,000 mg Oral Given 02/12/23 1552)    ED Course/ Medical Decision Making/ A&P                          Medical Decision Making Amount and/or Complexity of Data Reviewed Labs: ordered. Decision-making details documented in ED Course. Radiology: ordered. Decision-making details documented in ED Course.  Risk OTC drugs. Prescription drug management.    This patient presents to the ED for concern of abdominal pain, this involves an extensive number of treatment options, and is a complaint that carries with it a high risk of complications and morbidity.  I considered the following differential and admission for this acute, potentially life threatening condition.   MDM:    For DDX for abdominal  pain includes but is not limited to:  Abdominal exam without peritoneal signs. No evidence of acute abdomen at this time.   Patient has history of duodenal ulcer now presents with left upper quadrant epigastric pain.  Consider gastritis/PUD/another ulcer, also consider pancreatitis.  Patient with history of aortic disease states that he has abdominal pain sometimes rating to his back, will obtain CT angio of his abdomen to further evaluate his aorta and his vasculature.  Lower concern for appendicitis, diverticulitis, bowel obstruction.  He has no CVA tenderness to indicate pyelonephritis but must consider nephrolithiasis or ureterolithiasis as well.  No right upper quadrant tenderness to indicate acute bowel biliary disease.  Patient overall is stable albeit mildly hypertensive, though he is  afebrile with no tachycardia.  Will give fluids and reassess.   Clinical Course as of 02/12/23 1633  Wed Feb 12, 2023  1036 Hemoglobin: 16.4 Stable hgb [HN]  1036 WBC: 7.8 No leukocytosis [HN]  1142 Comprehensive metabolic panel(!) Mild increase in AG, lactate 2.0. Will give fluids for possible dehydration [HN]  1303 CT Angio Abd/Pel W and/or Wo Contrast VASCULAR  Stable surgical changes of infrarenal abdominal aortic aneurysm repair. Stable diameter of the aorta measuring up to 3.5 cm.  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 In addition there are areas of dilatation of the common femoral arteries. Please correlate with any previous intervention including endarterectomy. There is also however a focal high-grade stenosis seen at the left common femoral artery bifurcation. Please correlate with any particular symptoms. Dedicated evaluation as clinically appropriate.  NON-VASCULAR No bowel obstruction, free air or free fluid.   [HN]  1304 Imaging negative. W/ h/o duodenal ulcer, will give GI cocktail and IV protonix for c/f PUD/gastritis and assess response. [HN]  1542 Patient states that his symptoms did improve after IV protonix and GI cocktail. Consider gastritis most likely for this patient. Patient doesn't take any antacids. Has seen GI in past for duodenal ulcer he thinks but doesn't remember who it was. Will give new referral to [facility] and pepcid 20 mg BID rx x 30 days.  [HN]  1545 Patient also informed of stenosis of femoral artery. Vascular surgery referral given, patient instructed to call.  [HN]    Clinical Course User Index [HN] Loetta Rough, MD    Labs: I Ordered, and personally interpreted labs.  The pertinent results include:  those listed above  Imaging Studies ordered: I ordered imaging studies including CTA abdomen/pelvis I independently visualized and interpreted imaging. I agree with the radiologist interpretation  Additional history  obtained from chart review.    Cardiac Monitoring: The patient was maintained on a cardiac monitor.  I personally viewed and interpreted the cardiac monitored which showed an underlying rhythm of: NSR  Reevaluation: After the interventions noted above, I reevaluated the patient and found that they have :improved  Social Determinants of Health: Patient lives independently   Disposition:  DC w/ discharge instructions/return precautions. All questions answered to patient's satisfaction.    Co morbidities that complicate the patient evaluation  Past Medical History:  Diagnosis Date   Aneurysm    Anxiety    CAD (coronary artery disease)    02/01/17 Cath DES--> OM, staged PCI DES -->mLAD, EF 55%   COPD (chronic obstructive pulmonary disease)    smoker   Hypertension    high with anxiety   Myocardial infarction      Medicines Meds ordered this encounter  Medications   fentaNYL (SUBLIMAZE) injection 25  mcg   lactated ringers bolus 1,000 mL   iohexol (OMNIPAQUE) 350 MG/ML injection 75 mL   AND Linked Order Group    alum & mag hydroxide-simeth (MAALOX/MYLANTA) 200-200-20 MG/5ML suspension 30 mL    lidocaine (XYLOCAINE) 2 % viscous mouth solution 15 mL   pantoprazole (PROTONIX) injection 40 mg   acetaminophen (TYLENOL) tablet 1,000 mg    I have reviewed the patients home medicines and have made adjustments as needed  Problem List / ED Course: Problem List Items Addressed This Visit   None Visit Diagnoses     Acute gastritis without hemorrhage, unspecified gastritis type    -  Primary   Femoral artery stenosis                       This note was created using dictation software, which may contain spelling or grammatical errors.    Loetta Rough, MD 02/12/23 [id number]

## 2023-02-12 NOTE — ED Notes (Signed)
Patient transported to CT 

## 2023-02-12 NOTE — ED Triage Notes (Signed)
Per [facility] pt coming from home c/o abdominal pain since yesterday. Reports feeling sweaty and nausea. Took 1 extra strength tylenol this morning.

## 2023-02-12 NOTE — Discharge Instructions (Addendum)
Thank you for coming to [facility] Emergency Department. You were seen for abdominal pain. We did an exam, labs, and imaging, and these showed likely gastritis, or inflammation of the lining of your stomach as a result of acid. Tobacco use also contributes to this. Please do not take NSAIDs, and decrease or stop tobacco and alcohol use to help. We have prescribed pepcid 20 mg twice per day x 30 days. We have also given you a referral to call [facility] [phone]  to make a follow up appointment.  Your imaging also demonstrated stenosis of your femoral artery.  This can be managed as an outpatient with vascular surgery.  We have referred you to Dr. Nolon Bussing you can call at [phone].  Please call to make an appointment for sometime in the next several weeks.   Please follow up with your primary care provider within 1 week.   Do not hesitate to return to the ED or call 911 if you experience: -Worsening symptoms -Severe pain in your legs -Numbness or tingling in your legs -Lightheadedness, passing out -Fevers/chills -Anything else that concerns you

## 2023-02-24 ENCOUNTER — Encounter: Payer: Self-pay | Admitting: *Deleted

## 2023-02-24 DIAGNOSIS — Z006 Encounter for examination for normal comparison and control in clinical research program: Secondary | ICD-10-CM

## 2023-08-12 ENCOUNTER — Telehealth: Payer: Self-pay | Admitting: Vascular Surgery

## 2023-10-30 NOTE — Telephone Encounter (Signed)
Unable to reach pt at this time.

## 2024-02-02 ENCOUNTER — Observation Stay (HOSPITAL_COMMUNITY)

## 2024-02-02 ENCOUNTER — Other Ambulatory Visit: Payer: Self-pay

## 2024-02-02 ENCOUNTER — Encounter (HOSPITAL_COMMUNITY): Payer: Self-pay | Admitting: Emergency Medicine

## 2024-02-02 ENCOUNTER — Encounter (HOSPITAL_COMMUNITY)
Admission: EM | Disposition: A | Discharge status: Home / Self Care | Payer: Self-pay | Source: Home / Self Care | Attending: Internal Medicine

## 2024-02-02 ENCOUNTER — Emergency Department (HOSPITAL_COMMUNITY)

## 2024-02-02 ENCOUNTER — Inpatient Hospital Stay (HOSPITAL_COMMUNITY)
Admission: EM | Admit: 2024-02-02 | Discharge: 2024-02-08 | DRG: 356 | Disposition: A | Discharge status: Home / Self Care | Attending: Internal Medicine | Admitting: Internal Medicine

## 2024-02-02 DIAGNOSIS — Z79899 Other long term (current) drug therapy: Secondary | ICD-10-CM

## 2024-02-02 DIAGNOSIS — K625 Hemorrhage of anus and rectum: Secondary | ICD-10-CM | POA: Diagnosis not present

## 2024-02-02 DIAGNOSIS — I499 Cardiac arrhythmia, unspecified: Secondary | ICD-10-CM | POA: Diagnosis not present

## 2024-02-02 DIAGNOSIS — J449 Chronic obstructive pulmonary disease, unspecified: Secondary | ICD-10-CM

## 2024-02-02 DIAGNOSIS — F17210 Nicotine dependence, cigarettes, uncomplicated: Secondary | ICD-10-CM | POA: Diagnosis present

## 2024-02-02 DIAGNOSIS — K315 Obstruction of duodenum: Secondary | ICD-10-CM | POA: Diagnosis not present

## 2024-02-02 DIAGNOSIS — I10 Essential (primary) hypertension: Secondary | ICD-10-CM

## 2024-02-02 DIAGNOSIS — I2510 Atherosclerotic heart disease of native coronary artery without angina pectoris: Secondary | ICD-10-CM

## 2024-02-02 DIAGNOSIS — Z818 Family history of other mental and behavioral disorders: Secondary | ICD-10-CM

## 2024-02-02 DIAGNOSIS — E663 Overweight: Secondary | ICD-10-CM | POA: Diagnosis present

## 2024-02-02 DIAGNOSIS — K2950 Unspecified chronic gastritis without bleeding: Secondary | ICD-10-CM

## 2024-02-02 DIAGNOSIS — I252 Old myocardial infarction: Secondary | ICD-10-CM

## 2024-02-02 DIAGNOSIS — Z604 Social exclusion and rejection: Secondary | ICD-10-CM | POA: Diagnosis present

## 2024-02-02 DIAGNOSIS — Z7902 Long term (current) use of antithrombotics/antiplatelets: Secondary | ICD-10-CM

## 2024-02-02 DIAGNOSIS — K922 Gastrointestinal hemorrhage, unspecified: Secondary | ICD-10-CM | POA: Diagnosis present

## 2024-02-02 DIAGNOSIS — D62 Acute posthemorrhagic anemia: Secondary | ICD-10-CM | POA: Diagnosis not present

## 2024-02-02 DIAGNOSIS — E43 Unspecified severe protein-calorie malnutrition: Secondary | ICD-10-CM | POA: Insufficient documentation

## 2024-02-02 DIAGNOSIS — K921 Melena: Secondary | ICD-10-CM | POA: Diagnosis not present

## 2024-02-02 DIAGNOSIS — I700 Atherosclerosis of aorta: Secondary | ICD-10-CM | POA: Diagnosis present

## 2024-02-02 DIAGNOSIS — I7143 Infrarenal abdominal aortic aneurysm, without rupture: Secondary | ICD-10-CM | POA: Diagnosis present

## 2024-02-02 DIAGNOSIS — E785 Hyperlipidemia, unspecified: Secondary | ICD-10-CM | POA: Diagnosis present

## 2024-02-02 DIAGNOSIS — I959 Hypotension, unspecified: Secondary | ICD-10-CM | POA: Diagnosis not present

## 2024-02-02 DIAGNOSIS — I214 Non-ST elevation (NSTEMI) myocardial infarction: Secondary | ICD-10-CM | POA: Insufficient documentation

## 2024-02-02 DIAGNOSIS — D649 Anemia, unspecified: Secondary | ICD-10-CM

## 2024-02-02 DIAGNOSIS — I739 Peripheral vascular disease, unspecified: Secondary | ICD-10-CM | POA: Diagnosis present

## 2024-02-02 DIAGNOSIS — Z5941 Food insecurity: Secondary | ICD-10-CM

## 2024-02-02 DIAGNOSIS — I21A1 Myocardial infarction type 2: Secondary | ICD-10-CM | POA: Diagnosis not present

## 2024-02-02 DIAGNOSIS — Z720 Tobacco use: Secondary | ICD-10-CM | POA: Diagnosis present

## 2024-02-02 DIAGNOSIS — T39015A Adverse effect of aspirin, initial encounter: Secondary | ICD-10-CM | POA: Diagnosis present

## 2024-02-02 DIAGNOSIS — K264 Chronic or unspecified duodenal ulcer with hemorrhage: Secondary | ICD-10-CM | POA: Diagnosis present

## 2024-02-02 DIAGNOSIS — Z5948 Other specified lack of adequate food: Secondary | ICD-10-CM

## 2024-02-02 DIAGNOSIS — K260 Acute duodenal ulcer with hemorrhage: Principal | ICD-10-CM | POA: Diagnosis present

## 2024-02-02 DIAGNOSIS — R519 Headache, unspecified: Secondary | ICD-10-CM | POA: Diagnosis present

## 2024-02-02 DIAGNOSIS — F419 Anxiety disorder, unspecified: Secondary | ICD-10-CM

## 2024-02-02 DIAGNOSIS — Z955 Presence of coronary angioplasty implant and graft: Secondary | ICD-10-CM

## 2024-02-02 DIAGNOSIS — Z9889 Other specified postprocedural states: Secondary | ICD-10-CM

## 2024-02-02 DIAGNOSIS — D72829 Elevated white blood cell count, unspecified: Secondary | ICD-10-CM | POA: Diagnosis present

## 2024-02-02 LAB — COMPREHENSIVE METABOLIC PANEL WITH GFR
ALT: 15 U/L (ref 0–44)
AST: 30 U/L (ref 15–41)
Albumin: 2.8 g/dL — ABNORMAL LOW (ref 3.5–5.0)
Alkaline Phosphatase: 55 U/L (ref 38–126)
Anion gap: 10 (ref 5–15)
BUN: 20 mg/dL (ref 8–23)
CO2: 24 mmol/L (ref 22–32)
Calcium: 8.5 mg/dL — ABNORMAL LOW (ref 8.9–10.3)
Chloride: 103 mmol/L (ref 98–111)
Creatinine, Ser: 0.66 mg/dL (ref 0.61–1.24)
GFR, Estimated: >60 mL/min (ref >60)
Glucose, Bld: 122 mg/dL — ABNORMAL HIGH (ref 70–99)
Potassium: 3.7 mmol/L (ref 3.5–5.1)
Sodium: 137 mmol/L (ref 135–145)
Total Bilirubin: 0.3 mg/dL (ref 0.0–1.2)
Total Protein: 5.5 g/dL — ABNORMAL LOW (ref 6.5–8.1)

## 2024-02-02 LAB — CBC WITH DIFFERENTIAL/PLATELET
Abs Immature Granulocytes: 0.08 10*3/uL — ABNORMAL HIGH (ref 0.00–0.07)
Basophils Absolute: 0.1 10*3/uL (ref 0.0–0.1)
Basophils Relative: 1 %
Eosinophils Absolute: 0.1 10*3/uL (ref 0.0–0.5)
Eosinophils Relative: 1 %
HCT: 22.2 % — ABNORMAL LOW (ref 39.0–52.0)
Hemoglobin: 7.3 g/dL — ABNORMAL LOW (ref 13.0–17.0)
Immature Granulocytes: 1 %
Lymphocytes Relative: 14 %
Lymphs Abs: 1.8 10*3/uL (ref 0.7–4.0)
MCH: 30 pg (ref 26.0–34.0)
MCHC: 32.9 g/dL (ref 30.0–36.0)
MCV: 91.4 fL (ref 80.0–100.0)
Monocytes Absolute: 0.6 10*3/uL (ref 0.1–1.0)
Monocytes Relative: 4 %
Neutro Abs: 10.6 10*3/uL — ABNORMAL HIGH (ref 1.7–7.7)
Neutrophils Relative %: 79 %
Platelets: 482 10*3/uL — ABNORMAL HIGH (ref 150–400)
RBC: 2.43 MIL/uL — ABNORMAL LOW (ref 4.22–5.81)
RDW: 13 % (ref 11.5–15.5)
WBC: 13.1 10*3/uL — ABNORMAL HIGH (ref 4.0–10.5)
nRBC: 0 % (ref 0.0–0.2)

## 2024-02-02 LAB — PROTIME-INR
INR: 1.1 (ref 0.8–1.2)
Prothrombin Time: 14.5 s (ref 11.4–15.2)

## 2024-02-02 LAB — HEMOGLOBIN AND HEMATOCRIT, BLOOD
HCT: 26.3 % — ABNORMAL LOW (ref 39.0–52.0)
Hemoglobin: 9 g/dL — ABNORMAL LOW (ref 13.0–17.0)

## 2024-02-02 LAB — HIV ANTIBODY (ROUTINE TESTING W REFLEX): HIV Screen 4th Generation wRfx: NONREACTIVE

## 2024-02-02 LAB — PREPARE RBC (CROSSMATCH)

## 2024-02-02 LAB — POC OCCULT BLOOD, ED: Fecal Occult Bld: POSITIVE — AB

## 2024-02-02 SURGERY — EGD (ESOPHAGOGASTRODUODENOSCOPY)
Anesthesia: Monitor Anesthesia Care

## 2024-02-02 MED ORDER — PROPOFOL 10 MG/ML IV BOLUS
INTRAVENOUS | Status: DC | PRN
  Administered 2024-02-02 (×2): 30 mg via INTRAVENOUS
  Administered 2024-02-02: 70 mg via INTRAVENOUS

## 2024-02-02 MED ORDER — PROPOFOL 500 MG/50ML IV EMUL
INTRAVENOUS | Status: DC | PRN
  Administered 2024-02-02: 170 ug/kg/min via INTRAVENOUS

## 2024-02-02 MED ORDER — ACETAMINOPHEN 650 MG RE SUPP: 650.0000 mg | Freq: Four times a day (QID) | RECTAL | Status: DC | PRN

## 2024-02-02 MED ORDER — ALBUTEROL SULFATE (2.5 MG/3ML) 0.083% IN NEBU: 2.5000 mg | INHALATION_SOLUTION | Freq: Four times a day (QID) | RESPIRATORY_TRACT | Status: DC | PRN

## 2024-02-02 MED ORDER — ONDANSETRON HCL 4 MG/2ML IJ SOLN
4.0000 mg | Freq: Once | INTRAMUSCULAR | Status: AC
Start: 2024-02-02 — End: 2024-02-02
  Administered 2024-02-02: 4 mg via INTRAVENOUS
  Filled 2024-02-02: qty 2

## 2024-02-02 MED ORDER — NICOTINE 21 MG/24HR TD PT24
21.0000 mg | MEDICATED_PATCH | Freq: Every day | TRANSDERMAL | Status: DC
  Administered 2024-02-02 – 2024-02-08 (×7): 21 mg via TRANSDERMAL
  Filled 2024-02-02 (×7): qty 1

## 2024-02-02 MED ORDER — HYDROMORPHONE HCL 1 MG/ML IJ SOLN
1.0000 mg | Freq: Once | INTRAMUSCULAR | Status: AC
  Administered 2024-02-02: 1 mg via INTRAVENOUS
  Filled 2024-02-02: qty 1

## 2024-02-02 MED ORDER — SODIUM CHLORIDE 0.9 % IV SOLN
3.0000 g | Freq: Once | INTRAVENOUS | Status: AC
  Administered 2024-02-02: 3 g via INTRAVENOUS
  Filled 2024-02-02: qty 8

## 2024-02-02 MED ORDER — ACETAMINOPHEN 325 MG PO TABS
650.0000 mg | ORAL_TABLET | Freq: Four times a day (QID) | ORAL | Status: DC | PRN
  Administered 2024-02-02 – 2024-02-08 (×18): 650 mg via ORAL
  Filled 2024-02-02 (×18): qty 2

## 2024-02-02 MED ORDER — PHENYLEPHRINE HCL-NACL 20-0.9 MG/250ML-% IV SOLN
INTRAVENOUS | Status: DC | PRN
  Administered 2024-02-02: 50 ug/min via INTRAVENOUS

## 2024-02-02 MED ORDER — SODIUM CHLORIDE 0.9 % IV SOLN: INTRAVENOUS | Status: DC | PRN

## 2024-02-02 MED ORDER — FENTANYL CITRATE PF 50 MCG/ML IJ SOSY
50.0000 ug | PREFILLED_SYRINGE | Freq: Once | INTRAMUSCULAR | Status: AC
  Administered 2024-02-02: 50 ug via INTRAVENOUS
  Filled 2024-02-02: qty 1

## 2024-02-02 MED ORDER — PANTOPRAZOLE SODIUM 40 MG IV SOLR
40.0000 mg | Freq: Two times a day (BID) | INTRAVENOUS | Status: DC
Start: 2024-02-05 — End: 2024-02-04

## 2024-02-02 MED ORDER — LIDOCAINE 2% (20 MG/ML) 5 ML SYRINGE
INTRAMUSCULAR | Status: DC | PRN
  Administered 2024-02-02: 40 mg via INTRAVENOUS

## 2024-02-02 MED ORDER — SODIUM CHLORIDE (PF) 0.9 % IJ SOLN
INTRAMUSCULAR | Status: AC
  Administered 2024-02-02: 10 mL
  Filled 2024-02-02: qty 10

## 2024-02-02 MED ORDER — IOHEXOL 350 MG/ML SOLN
80.0000 mL | Freq: Once | INTRAVENOUS | Status: AC | PRN
  Administered 2024-02-02: 80 mL via INTRAVENOUS

## 2024-02-02 MED ORDER — PANTOPRAZOLE SODIUM 40 MG IV SOLR
40.0000 mg | Freq: Three times a day (TID) | INTRAVENOUS | Status: DC
  Administered 2024-02-02 – 2024-02-04 (×6): 40 mg via INTRAVENOUS
  Filled 2024-02-02 (×7): qty 10

## 2024-02-02 MED ORDER — SODIUM CHLORIDE 0.9 % IV BOLUS
1000.0000 mL | Freq: Once | INTRAVENOUS | Status: AC
  Administered 2024-02-02: 1000 mL via INTRAVENOUS

## 2024-02-02 MED ORDER — SODIUM CHLORIDE 0.9% FLUSH
3.0000 mL | Freq: Two times a day (BID) | INTRAVENOUS | Status: DC
Start: 2024-02-02 — End: 2024-02-08
  Administered 2024-02-02 – 2024-02-08 (×12): 3 mL via INTRAVENOUS

## 2024-02-02 MED ORDER — SODIUM CHLORIDE 0.9% IV SOLUTION: Freq: Once | INTRAVENOUS | Status: AC

## 2024-02-02 MED ORDER — PHENYLEPHRINE HCL (PRESSORS) 10 MG/ML IV SOLN
INTRAVENOUS | Status: DC | PRN
Start: 2024-02-02 — End: 2024-02-02
  Administered 2024-02-02: 120 ug via INTRAVENOUS
  Administered 2024-02-02: 80 ug via INTRAVENOUS
  Administered 2024-02-02: 200 ug via INTRAVENOUS
  Administered 2024-02-02: 160 ug via INTRAVENOUS
  Administered 2024-02-02: 80 ug via INTRAVENOUS
  Administered 2024-02-02: 160 ug via INTRAVENOUS

## 2024-02-02 MED ORDER — PANTOPRAZOLE SODIUM 40 MG IV SOLR
40.0000 mg | INTRAVENOUS | Status: AC
  Filled 2024-02-02: qty 10

## 2024-02-02 MED ORDER — SODIUM CHLORIDE 0.9% IV SOLUTION: Freq: Once | INTRAVENOUS | Status: DC

## 2024-02-02 MED ORDER — DIPHENHYDRAMINE HCL 50 MG/ML IJ SOLN
25.0000 mg | Freq: Once | INTRAMUSCULAR | Status: AC | PRN
  Administered 2024-02-02: 25 mg via INTRAVENOUS
  Filled 2024-02-02: qty 1

## 2024-02-02 NOTE — Op Note (Signed)
 [facility] Patient Name: Anthony Coffey Procedure Date : 02/02/2024 MRN: [id number] Attending MD: Starr Lake. Myrtie Neither , MD, [id number] Date of Birth: 06-06-1956 CSN: [id number] Age: 68 Admit Type: Inpatient Procedure:                Upper GI endoscopy Indications:              Acute post hemorrhagic anemia, Melena, For therapy                            of chronic duodenal ulcer with hemorrhage Providers:                Sherilyn Cooter L. Myrtie Neither, MD, Suzy Bouchard, RN, Marja Kays, Technician Referring MD:              Medicines:                Monitored Anesthesia Care Complications:            No immediate complications. Estimated Blood Loss:     Estimated blood loss was minimal. Procedure:                Pre-Anesthesia Assessment:                           - Prior to the procedure, a History and Physical                            was performed, and patient medications and                            allergies were reviewed. The patient's tolerance of                            previous anesthesia was also reviewed. The risks                            and benefits of the procedure and the sedation                            options and risks were discussed with the patient.                            All questions were answered, and informed consent                            was obtained. Prior Anticoagulants: The patient has                            taken no anticoagulant or antiplatelet agents. ASA                            Grade Assessment: IV - A patient with severe  systemic disease that is a constant threat to life.                            After reviewing the risks and benefits, the patient                            was deemed in satisfactory condition to undergo the                            procedure.                           After obtaining informed consent, the endoscope was                            passed  under direct vision. Throughout the                            procedure, the patient's blood pressure, pulse, and                            oxygen saturations were monitored continuously. The                            [id number]-[id number] ([id number]) Olympus endoscope was introduced                            through the mouth, and advanced to the second part                            of duodenum. The upper GI endoscopy was                            accomplished without difficulty. The patient                            tolerated the procedure well. Scope In: Scope Out: Findings:      The esophagus was normal.      Adherent hematin (altered blood/coffee-ground-like material) was found       in the gastric antrum.      An acquired benign-appearing, intrinsic moderate stenosis was found in       the duodenal bulb and was traversed. On the distal side of that and       encompassing the entire distal duodenal bulb and the sweep was the ulcer       described below. At the distal end of the ulcer was another stricture       that was reached with the scope. Some of the second portion duodenum       could be seen but the scope could not be advanced deeply into the second       portion due to scope looping and the strictures. Care was taken to use       minimal insufflation due to the size and depth of the ulcer with       adjacent stranding noted on CTA.  One non-bleeding cratered duodenal ulcer with adherent clot was found in       the duodenal bulb. The lesion was approximately 30 mm in largest       dimension (coinciding with CTA description). There was a large overlying       clot with food debris limiting the visualization. No fresh blood was       seen coming from that area. For hemostasis, hemostatic spray was       deployed Designer, fashion/clothing). Three sprays were applied, which was sufficient to       cover the area.      After taking that intervention on the duodenal ulcer, the scope was then        withdrawn to the stomach. Several biopsies were obtained on the greater       curvature of the gastric body, on the lesser curvature of the gastric       body, on the greater curvature of the gastric antrum and on the lesser       curvature of the gastric antrum with cold forceps for histology. (All       biopsies in one pathology jar to rule out H. pylori) Impression:               - Normal esophagus.                           - Hematin (altered blood/coffee-ground-like                            material) in the gastric antrum.                           - Acquired duodenal stenoses.                           - Non-bleeding duodenal ulcer with adherent clot.                            Hemostatic spray applied.                           - Several biopsies were obtained on the greater                            curvature of the gastric body, on the lesser                            curvature of the gastric body, on the greater                            curvature of the gastric antrum and on the lesser                            curvature of the gastric antrum.                           Abdomen was soft and nondistended at the completion  of the procedure. Recommendation:           - Return patient to hospital ward for ongoing care.                           - N.p.o. except ice chips                           Protonix 40 mg IV every 8 hours                           Every 6 hour hemoglobin and hematocrit for the next                            48 hours, then further recommendations to follow                            regarding that                           Surgical consultation was obtained and appreciated.                           If this patient develops recurrent brisk bleeding                            with hematemesis or melena/bright red blood per                            rectum with hemodynamic compromise, stat consult to                             interventional radiology and called the surgical                            service. It is not clear if this patient's                            peripheral arterial disease would allow for IR                            access/intervention, but both services would need                            to be involved for care plan discussion. There is                            no further endoscopic intervention that can be                            taken on this ulcer. Procedure Code(s):        --- Professional ---                           43255, 59, Esophagogastroduodenoscopy, flexible,  transoral; with control of bleeding, any method                           43239, Esophagogastroduodenoscopy, flexible,                            transoral; with biopsy, single or multiple Diagnosis Code(s):        --- Professional ---                           K92.2, Gastrointestinal hemorrhage, unspecified                           K31.5, Obstruction of duodenum                           K26.4, Chronic or unspecified duodenal ulcer with                            hemorrhage                           D62, Acute posthemorrhagic anemia                           K92.1, Melena (includes Hematochezia) CPT copyright 2022 American [employer]. All rights reserved. The codes documented in this report are preliminary and upon coder review may  be revised to meet current compliance requirements. Bryn Saline L. Myrtie Neither, MD 02/02/2024 2:11:41 PM This report has been signed electronically. Number of Addenda: 0

## 2024-02-02 NOTE — Transfer of Care (Signed)
 Immediate Anesthesia Transfer of Care Note  Patient: Anthony Coffey  Procedure(s) Performed: EGD (ESOPHAGOGASTRODUODENOSCOPY)  Patient Location: PACU and Endoscopy Unit  Anesthesia Type:MAC  Level of Consciousness: awake and alert   Airway & Oxygen Therapy: Patient Spontanous Breathing and Patient connected to nasal cannula oxygen  Post-op Assessment: Report given to RN and Post -op Vital signs reviewed and stable  Post vital signs: Reviewed and stable  Last Vitals:  Vitals Value Taken Time  BP 121/60 02/02/24 1410  Temp    Pulse 53 02/02/24 1412  Resp 23 02/02/24 1412  SpO2 100 % 02/02/24 1412  Vitals shown include unfiled device data.  Last Pain:  Vitals:   02/02/24 1317  TempSrc: Temporal  PainSc: 8          Complications: No notable events documented.

## 2024-02-02 NOTE — ED Triage Notes (Addendum)
 Patient BIB EMS from home. Patient had a very large bloody stool today around 0300. Patient initial BP from EMS was 70's SBP.  Patient denies alcohol use, blood thinners.

## 2024-02-02 NOTE — H&P (View-Only) (Signed)
 Consultation  Referring Provider: [facility]/ Hyacinth Meeker Primary Care Physician:  Pcp, No Primary Gastroenterologist: unassigned  Reason for Consultation: Acute major GI bleed  HPI: Anthony Coffey is a 68 y.o. male, with history of coronary artery disease, status post MI 2018 with drug-eluting stents x 2, he says he is not any longer on any antiplatelet medication.  Also with history of COPD, continued tobacco use, hypertension, anxiety and history of infrarenal abdominal aortic aneurysm repair in 2010. Patient has not had any prior history of GI bleeding, no history of previous EGD or colonoscopy.  He presented this morning after he developed acute large-volume dark bloody stools at home which started around 3 AM.  EMS was called, he was found hypotensive with systolic blood pressure of 70 on arrival.  He says he had 2-3 episodes at home and then had 1 more episode after he came to the emergency room.  He has not had any hematemesis.  He says he has been having upper/mid abdominal pain over the past week which has been fairly constant.  No fever or chills. He has been taking Goody powders on a regular basis usually at least 2/day for headaches.  Last hemoglobin from February 2025 was 16, hemoglobin here 7.3 on arrival. Pro time 14.5/INR 1.0 Potassium 3.7 BUN 20/creatinine 0.66  He has been transfused 1 unit of packed RBCs, second unit ordered.  He underwent CTA earlier this morning which does not show any evidence of active GI bleed, but does show transmural ulceration along the posterior wall of the descending duodenum with surrounding soft tissue stranding no extraluminal gas to suggest free perforation cannot exclude contained duodenal ulcer perforation, ulcer measures about 3 cm. 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  Blood pressure currently  109/60   pulse in the 70s   Past Medical History:  Diagnosis Date   Aneurysm (HCC)    Anxiety    CAD (coronary artery disease)    02/01/17 Cath DES--> OM, staged PCI DES -->mLAD, EF 55%   COPD (chronic obstructive pulmonary disease) (HCC)    smoker   Hypertension    high with anxiety   Myocardial infarction [facility])     Past Surgical History:  Procedure Laterality Date   ABDOMINAL AORTIC ANEURYSM REPAIR     CORONARY STENT INTERVENTION N/A 02/01/2017   Procedure: Coronary Stent Intervention;  Surgeon: Lyn Records, MD;  Location: [facility] INVASIVE CV LAB;  Service: Cardiovascular;  Laterality: N/A;   CORONARY STENT INTERVENTION N/A 02/03/2017   Procedure: Coronary Stent Intervention;  Surgeon: Tonny Bollman, MD;  Location: [facility] INVASIVE CV LAB;  Service: Cardiovascular;  Laterality: N/A;   EAR CYST EXCISION Right 06/14/2013   Procedure: CYST REMOVAL RIGHT MANDIBLE;  Surgeon: Georgia Lopes, DDS;  Location: [facility] OR;  Service: Oral Surgery;  Laterality: Right;   INTRAMEDULLARY (IM) NAIL INTERTROCHANTERIC Left 01/22/2020   Procedure: INTRAMEDULLARY (IM) NAIL INTERTROCHANTRIC;  Surgeon: Yolonda Kida, MD;  Location: [facility] OR;  Service: Orthopedics;  Laterality: Left;   LEFT HEART CATH AND CORONARY ANGIOGRAPHY N/A 02/01/2017   Procedure: Left Heart Cath and Coronary Angiography;  Surgeon: Lyn Records, MD;  Location: [facility] INVASIVE CV LAB;  Service: Cardiovascular;  Laterality: N/A;   TONSILLECTOMY     TOOTH EXTRACTION N/A 06/14/2013   Procedure: EXTRACTIONS 17, 23, 26, 27, 29, 32;  Surgeon: Lonzo Cloud  Barbette Merino, DDS;  Location: [facility] OR;  Service: Oral Surgery;  Laterality: N/A;   VASCULAR SURGERY      Prior to Admission medications   Medication Sig Start Date End Date Taking? Authorizing Provider  acetaminophen (TYLENOL) 500 MG tablet Take 1 tablet (500 mg total) by mouth every 6 (six) hours as needed for headache (pain). 02/04/17   Arty Baumgartner, NP  alum & mag hydroxide-simeth (MAALOX MAX) 400-400-40  MG/5ML suspension Take 5 mLs by mouth every 6 (six) hours as needed for indigestion. 02/24/22   Horton, Clabe Seal, DO  atorvastatin (LIPITOR) 80 MG tablet Take 1 tablet (80 mg total) by mouth daily. 11/16/19 01/22/20  Lyn Records, MD  clopidogrel (PLAVIX) 75 MG tablet Take 1 tablet (75 mg total) by mouth daily. 01/24/20   Jerald Kief, MD  fenofibrate 160 MG tablet Take 1 tablet (160 mg total) by mouth daily. 11/16/19   Lyn Records, MD  lisinopril (ZESTRIL) 10 MG tablet Take 1 tablet (10 mg total) by mouth daily. 09/28/19   Marjie Skiff E, PA-C  metoprolol tartrate (LOPRESSOR) 25 MG tablet Take 1 tablet (25 mg total) by mouth 2 (two) times daily. 09/28/19   Marjie Skiff E, PA-C  nitroGLYCERIN (NITROSTAT) 0.4 MG SL tablet Place 1 tablet (0.4 mg total) under the tongue every 5 (five) minutes as needed for chest pain. 01/11/20   Lyn Records, MD  Omega-3 Fatty Acids (FISH OIL) 1000 MG CAPS Take 4 capsules (4,000 mg total) by mouth daily. 07/03/17   Lyn Records, MD  pantoprazole (PROTONIX) 20 MG tablet Take 1 tablet (20 mg total) by mouth daily. 02/24/22   Horton, Clabe Seal, DO  Study - ORION 4 - inclisiran 300 mg/1.29mL or placebo SQ injection (PI-Stuckey) Inject 300 mg into the skin every 6 (six) months. 01/19/20   [provider]    Current Facility-Administered Medications  Medication Dose Route Frequency Provider Last Rate Last Admin   acetaminophen (TYLENOL) tablet 650 mg  650 mg Oral Q6H PRN Clydie Braun, MD       Or   acetaminophen (TYLENOL) suppository 650 mg  650 mg Rectal Q6H PRN Madelyn Flavors A, MD       albuterol (PROVENTIL) (2.5 MG/3ML) 0.083% nebulizer solution 2.5 mg  2.5 mg Nebulization Q6H PRN Smith, Rondell A, MD       pantoprazole (PROTONIX) injection 40 mg  40 mg Intravenous Q8H Horton, Mayer Masker, MD       Followed by   Melene Muller ON 02/05/2024] pantoprazole (PROTONIX) injection 40 mg  40 mg Intravenous Q12H Horton, Mayer Masker, MD       sodium chloride flush  (NS) 0.9 % injection 3 mL  3 mL Intravenous Q12H Smith, Rondell A, MD       Current Outpatient Medications  Medication Sig Dispense Refill   acetaminophen (TYLENOL) 500 MG tablet Take 1 tablet (500 mg total) by mouth every 6 (six) hours as needed for headache (pain). 30 tablet 0   alum & mag hydroxide-simeth (MAALOX MAX) 400-400-40 MG/5ML suspension Take 5 mLs by mouth every 6 (six) hours as needed for indigestion. 355 mL 0   atorvastatin (LIPITOR) 80 MG tablet Take 1 tablet (80 mg total) by mouth daily. 30 tablet 11   clopidogrel (PLAVIX) 75 MG tablet Take 1 tablet (75 mg total) by mouth daily. 30 tablet 11   fenofibrate 160 MG tablet Take 1 tablet (160 mg total) by mouth daily. 30 tablet 11  lisinopril (ZESTRIL) 10 MG tablet Take 1 tablet (10 mg total) by mouth daily. 30 tablet 11   metoprolol tartrate (LOPRESSOR) 25 MG tablet Take 1 tablet (25 mg total) by mouth 2 (two) times daily. 30 tablet 11   nitroGLYCERIN (NITROSTAT) 0.4 MG SL tablet Place 1 tablet (0.4 mg total) under the tongue every 5 (five) minutes as needed for chest pain. 25 tablet 3   Omega-3 Fatty Acids (FISH OIL) 1000 MG CAPS Take 4 capsules (4,000 mg total) by mouth daily.  0   pantoprazole (PROTONIX) 20 MG tablet Take 1 tablet (20 mg total) by mouth daily. 30 tablet 0   Study - ORION 4 - inclisiran 300 mg/1.62mL or placebo SQ injection (PI-Stuckey) Inject 300 mg into the skin every 6 (six) months.      Allergies as of 02/02/2024   (No Known Allergies)    Family History  Problem Relation Age of Onset   Anxiety disorder Brother     Social History   Socioeconomic History   Marital status: Single    Spouse name: Not on file   Number of children: Not on file   Years of education: Not on file   Highest education level: Not on file  Occupational History   Not on file  Tobacco Use   Smoking status: Every Day    Current packs/day: 1.00    Average packs/day: 1 pack/day for 39.0 years (39.0 ttl pk-yrs)    Types:  Cigarettes   Smokeless tobacco: Former  Substance and Sexual Activity   Alcohol use: No   Drug use: No   Sexual activity: Never  Other Topics Concern   Not on file  Social History Narrative   ** Merged History Encounter **       Social Drivers of Corporate investment banker Strain: Not on file  Food Insecurity: Not on file  Transportation Needs: Not on file  Physical Activity: Not on file  Stress: Not on file  Social Connections: Not on file  Intimate Partner Violence: Not At Risk (12/20/2023)   Received from [facility]   HITS    Over the last 12 months how often did your partner physically hurt you?: Never    Over the last 12 months how often did your partner insult you or talk down to you?: Never    Over the last 12 months how often did your partner threaten you with physical harm?: Never    Over the last 12 months how often did your partner scream or curse at you?: Never    Review of Systems: Pertinent positive and negative review of systems were noted in the above HPI section.  All other review of systems was otherwise negative.   Physical Exam: Vital signs in last 24 hours: Temp:  [98.5 F (36.9 C)-99 F (37.2 C)] 98.6 F (37 C) (04/07 0939) Pulse Rate:  [68-98] 68 (04/07 0939) Resp:  [11-25] 16 (04/07 0939) BP: (104-127)/(51-93) 109/62 (04/07 0939) SpO2:  [100 %] 100 % (04/07 0939) Weight:  [79.4 kg] 79.4 kg (04/07 0620)   General:   Alert,  Well-developed, older white male well-nourished, pleasant and cooperative in NAD, pale Head:  Normocephalic and atraumatic. Eyes:  Sclera clear, no icterus.   Conjunctiva pale. Ears:  Normal auditory acuity. Nose:  No deformity, discharge,  or lesions. Mouth:  No deformity or lesions.   Neck:  Supple; no masses or thyromegaly. Lungs:  Clear throughout to auscultation.  Somewhat decreased breath sounds bilaterally  heart:  Regular rate and rhythm; no murmurs, clicks, rubs,  or gallops. Abdomen:  Soft, he is tender in  the hypogastrium with some guarding no rebound, BS active,nonpalp mass or hsm.  Midline incisional scar Rectal: Not done-witnessed dark bloody bowel movement per ER Msk:  Symmetrical without gross deformities. . Pulses:  Normal pulses noted. Extremities:  Without clubbing or edema. Neurologic:  Alert and  oriented x4;  grossly normal neurologically. Skin:  Intact without significant lesions or rashes.. Psych:  Alert and cooperative. Normal mood and affect.  Intake/Output from previous day: 04/06 0701 - 04/07 0700 In: 800 [I.V.:800] Out: -  Intake/Output this shift: No intake/output data recorded.  Lab Results: Recent Labs    02/02/24 0636  WBC 13.1*  HGB 7.3*  HCT 22.2*  PLT 482*   BMET Recent Labs    02/02/24 0636  NA 137  K 3.7  CL 103  CO2 24  GLUCOSE 122*  BUN 20  CREATININE 0.66  CALCIUM 8.5*   LFT Recent Labs    02/02/24 0636  PROT 5.5*  ALBUMIN 2.8*  AST 30  ALT 15  ALKPHOS 55  BILITOT 0.3   PT/INR Recent Labs    02/02/24 0636  LABPROT 14.5  INR 1.1   Hepatitis Panel No results for input(s): "HEPBSAG", "HCVAB", "HEPAIGM", "HEPBIGM" in the last 72 hours.    IMPRESSION:  #36 68 year old white male with acute major upper GI bleed with hypotension secondary to large/3 cm duodenal ulcer which on CTA is transmural along the posterior wall of the descending duodenum with surrounding soft tissue stranding, cannot exclude contained duodenal ulcer perforation but no free perforation noted.  #2 anemia acute secondary to acute GI bleed with 6 g drop in hemoglobin  #3 COPD no oxygen use #4 coronary artery disease status post MI 2018 drug-eluting stents x 2, says he was taken off of antiplatelet agents a couple of years ago #5 history of infrarenal abdominal aortic aneurysm repair 2010 #6 status post ORIF Left femur  PLAN: Keep n.p.o. Transfuse second unit of packed RBCs and keep at least 2 units of packed RBCs available head IV PPI every 6 hours over  the next 24 hours,  Surgical consultation prior to EGD which will be scheduled for this afternoon with Dr. Myrtie Neither.  Discussed with surgery/Michael Macis PA-C. Patient is at high risk for perforation with EGD, and need surgery on standby.  GI will follow closely with you Stop Marlin Canary powder use   Amy Esterwood PA-C 02/02/2024, 11:44 AM  I have taken an interval history, thoroughly reviewed the chart and examined the patient. I agree with the Advanced Practitioner's note, impression and recommendations, and have recorded additional findings, impressions and recommendations below. I performed a substantive portion of this encounter (>50% time spent), including a complete performance of the medical decision making.  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 He has stabilized with volume resuscitation. Right-sided abdominal pain but nonsurgical abdomen. CTA images personally reviewed-this is a large and deep ulcer with surrounding stranding.  This unquestionably presents the increased risks for perforation just from passage of the endoscope and insufflation even if no intervention such as injection cauterization or clipping are performed.  Those interventions are unlikely feasible given the size of the  ulcer and its depth with those risks. However, upper endoscopic evaluation warranted to identify the source and possibly apply some hemostatic spray or gel if necessary. Surgical consultation requested so they can be on standby in case surgical intervention warranted. I spoke with Dr. Janee Morn of the general surgery service who is already aware of the patient (their PA will evaluate the patient as soon as they can). If there is recurrence of GI  bleeding not amenable to endoscopic evaluation, interventional radiology should also be consulted, though I expect they will be limited in ability to perform percutaneous procedure due to the severity of femoral/iliac vascular disease.  Upper endoscopy recommended along with a clear description of the procedure and explanation of the increased risks in his particular clinical scenario, and Mr. Knust was agreeable.  The benefits and risks of the planned procedure(s) were described in detail with the patient or (when appropriate) their health care proxy.  Risks were outlined as including, but not limited to, bleeding, infection, perforation, adverse medication reaction leading to cardiac or pulmonary decompensation, pancreatitis (if ERCP).  The limitation of incomplete mucosal visualization was also discussed.  No guarantees or warranties were given. Patient at increased risk for cardiopulmonary complications of procedure due to medical comorbidities.  He has received 1 out of 2 planned units of PRBCs, vital signs stable at the time of my evaluation, he is on IV PPI and has good peripheral IV access.  Given the radiographic description of this ulcer and his leukocytosis, we are also ordering Zosyn in case there is any degree of contained perforation.  [name] Office:[phone]

## 2024-02-02 NOTE — H&P (Addendum)
 History and Physical    Patient: Anthony Coffey ZOX:[id number] DOB: 1956/01/11 DOA: 02/02/2024 DOS: the patient was seen and examined on 02/02/2024 PCP: Pcp, No  Patient coming from: Home via EMS  Chief Complaint:  Chief Complaint  Patient presents with   Rectal Bleeding   HPI: Anthony Coffey is a 68 y.o. male with medical history significant of hypertension, CAD, abdominal aneurysm s/p repair presents with blood in stool and abdominal pain.  He first noticed blood in his stool, described as both dark and bright red, beginning last night. This has occurred twice since the onset.  He has abdominal pain located in the middle of his stomach, radiating across the upper abdomen. The pain moves back and forth across the upper abdomen.  He takes two BC goody powders twice a week and denies taking other over-the-counter medications like ibuprofen, Aleve, or naproxen.  No nausea or vomiting, but he mentions difficulty walking this morning due to extreme weakness.  He has never had a endoscopy or colonoscopy previously before.  Patient has not routinely follow with a primary care provider.  En route with EMS initial blood pressure noted to be in the 70s.  In the ED patient was noted to be afebrile with blood pressure as low as 104/51,and all other vital signs maintained.  Labs significant for WBC 13.1, hemoglobin noted to be 7.3, and platelets 482.  Stool guaiacs were noted to be positive.  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  Patient was typed and screened and ordered 2 units of packed red blood cells.  Patient was given Protonix IV, 1 L of normal saline IV fluids, Zofran, and IV pain medication.   GI have been formally consulted.   Review of Systems: As mentioned in the history of present illness. All  other systems reviewed and are negative. Past Medical History:  Diagnosis Date   Aneurysm (HCC)    Anxiety    CAD (coronary artery disease)    02/01/17 Cath DES--> OM, staged PCI DES -->mLAD, EF 55%   COPD (chronic obstructive pulmonary disease) (HCC)    smoker   Hypertension    high with anxiety   Myocardial infarction [facility])    Past Surgical History:  Procedure Laterality Date   ABDOMINAL AORTIC ANEURYSM REPAIR     CORONARY STENT INTERVENTION N/A 02/01/2017   Procedure: Coronary Stent Intervention;  Surgeon: Lyn Records, MD;  Location: [facility] INVASIVE CV LAB;  Service: Cardiovascular;  Laterality: N/A;   CORONARY STENT INTERVENTION N/A 02/03/2017   Procedure: Coronary Stent Intervention;  Surgeon: Tonny Bollman, MD;  Location: [facility] INVASIVE CV LAB;  Service: Cardiovascular;  Laterality: N/A;   EAR CYST EXCISION Right 06/14/2013   Procedure: CYST REMOVAL RIGHT MANDIBLE;  Surgeon: Georgia Lopes, DDS;  Location: [facility] OR;  Service: Oral Surgery;  Laterality: Right;   INTRAMEDULLARY (IM) NAIL INTERTROCHANTERIC Left 01/22/2020   Procedure: INTRAMEDULLARY (IM) NAIL INTERTROCHANTRIC;  Surgeon: Yolonda Kida, MD;  Location: [facility] OR;  Service: Orthopedics;  Laterality: Left;   LEFT HEART CATH AND CORONARY ANGIOGRAPHY N/A 02/01/2017   Procedure: Left Heart Cath and Coronary Angiography;  Surgeon: Lyn Records, MD;  Location: [facility] INVASIVE CV LAB;  Service: Cardiovascular;  Laterality: N/A;   TONSILLECTOMY     TOOTH EXTRACTION N/A 06/14/2013   Procedure: EXTRACTIONS 17, 23, 26,  27, 29, 32;  Surgeon: Georgia Lopes, DDS;  Location: [facility] OR;  Service: Oral Surgery;  Laterality: N/A;   VASCULAR SURGERY     Social History:  reports that he has been smoking cigarettes. He has a 39 pack-year smoking history. He has quit using smokeless tobacco. He reports that he does not drink alcohol and does not use drugs.  No Known Allergies  Family History  Problem Relation Age of Onset   Anxiety disorder Brother      Prior to Admission medications   Medication Sig Start Date End Date Taking? Authorizing Provider  acetaminophen (TYLENOL) 500 MG tablet Take 1 tablet (500 mg total) by mouth every 6 (six) hours as needed for headache (pain). 02/04/17   Arty Baumgartner, NP  alum & mag hydroxide-simeth (MAALOX MAX) 400-400-40 MG/5ML suspension Take 5 mLs by mouth every 6 (six) hours as needed for indigestion. 02/24/22   Horton, Clabe Seal, DO  atorvastatin (LIPITOR) 80 MG tablet Take 1 tablet (80 mg total) by mouth daily. 11/16/19 01/22/20  Lyn Records, MD  clopidogrel (PLAVIX) 75 MG tablet Take 1 tablet (75 mg total) by mouth daily. 01/24/20   Jerald Kief, MD  fenofibrate 160 MG tablet Take 1 tablet (160 mg total) by mouth daily. 11/16/19   Lyn Records, MD  lisinopril (ZESTRIL) 10 MG tablet Take 1 tablet (10 mg total) by mouth daily. 09/28/19   Marjie Skiff E, PA-C  metoprolol tartrate (LOPRESSOR) 25 MG tablet Take 1 tablet (25 mg total) by mouth 2 (two) times daily. 09/28/19   Marjie Skiff E, PA-C  nitroGLYCERIN (NITROSTAT) 0.4 MG SL tablet Place 1 tablet (0.4 mg total) under the tongue every 5 (five) minutes as needed for chest pain. 01/11/20   Lyn Records, MD  Omega-3 Fatty Acids (FISH OIL) 1000 MG CAPS Take 4 capsules (4,000 mg total) by mouth daily. 07/03/17   Lyn Records, MD  pantoprazole (PROTONIX) 20 MG tablet Take 1 tablet (20 mg total) by mouth daily. 02/24/22   Horton, Clabe Seal, DO  Study - ORION 4 - inclisiran 300 mg/1.55mL or placebo SQ injection (PI-Stuckey) Inject 300 mg into the skin every 6 (six) months. 01/19/20   [provider]    Physical Exam: Vitals:   02/02/24 0865 02/02/24 0924 02/02/24 0939 02/02/24 0939  BP:  115/63 109/62 109/62  Pulse:  75 69 68  Resp:  18 17 16   Temp: 99 F (37.2 C) 98.9 F (37.2 C) 98.6 F (37 C) 98.6 F (37 C)  TempSrc: Oral Oral Oral Oral  SpO2:  100%  100%  Weight:      Height:        Constitutional: Older adult male  currently in no acute distress Eyes: PERRL, lids and conjunctivae normal ENMT: Mucous membranes are moist.    Neck: normal, supple,  Respiratory: clear to auscultation bilaterally, no wheezing, no crackles. Normal respiratory effort. No accessory muscle use.  Cardiovascular: Regular rate and rhythm, no murmurs / rubs / gallops.   Abdomen: Mild midline epigastric tenderness to palpation.  Bowel sounds appreciated in all 4 quadrants. Musculoskeletal: no clubbing / cyanosis. No joint deformity upper and lower extremities. Good ROM, no contractures. Normal muscle tone.  Skin: no rashes, lesions, ulcers.  Pallor present. Neurologic: CN 2-12 grossly intact. Sensation intact, DTR normal. Strength 5/5 in all 4.  Psychiatric: Normal judgment and insight. Alert and oriented x 3. Normal mood.   Data Reviewed:  reviewed labs, imaging, pertinent  records as documented.  Assessment and Plan:  Upper GI bleed secondary to duodenal ulcer Acute blood loss anemia Acute.  Patient presents with complaints of rectal bleeding starting last night and epigastric abdominal pain.  Hemoglobin noted to be 7.3, but had been 16.4 when last checked on 02/12/2023.  Stool guaiacs were noted to be positive.  Patient admits to using [facility] Goody powders 2 times per week on average.  CT noted ulceration in the duodenum without signs of perforation.  Patient has been typed and screened and ordered 2 units packed red blood cells. -Admitted to a progressive bed -N.p.o. for likely EGD -Continue with transfusion of 2 units of packed red blood cells -Serial monitoring of H&H.  Continue to monitor and transfuse additional units of packed red blood cells as medically warranted -[place] [facility] consulted,  will follow-up any further recommendations  Leukocytosis Acute.  WBC elevated at 13.1.  No significant fever appreciated.  Suspect secondary to above.   -Recheck CBC tomorrow morning  Transient hypotension Resolved.  On EMS arrival  patient blood pressures were noted to be in the 70s.  Blood pressures currently stable with MAPs maintained since in the hospital.. -Goal MAP greater than 65 - Avoiding blood pressure medications at this time  History of AAA. Patient is status post aneurysmal repair and follows with vascular surgery.  CT imaging noted Postsurgical changes from previous abdominal aortic aneurysm repair. Infrarenal abdominal aortic aneurysm measures 3.5 x 3.5 cm. Previously 3.5 x 3.2 cm. 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 Previously 2.7cm and 2.4 cm respectively. -Continue outpatient follow-up with vascular surgery  Tobacco abuse Patient reports smoking cigarettes.  DVT prophylaxis: SCDs Advance Care Planning:   Code Status: Full Code   Consults: Gastroenterology  Family Communication: None  Severity of Illness: The appropriate patient status for this patient is OBSERVATION. Observation status is judged to be reasonable and necessary in order to provide the required intensity of service to ensure the patient's safety. The patient's presenting symptoms, physical exam findings, and initial radiographic and laboratory data in the context of their medical condition is felt to place them at decreased risk for further clinical deterioration. Furthermore, it is anticipated that the patient will be medically stable for discharge from the hospital within 2 midnights of admission.   Author: Clydie Braun, MD 02/02/2024 10:34 AM  For on call review [email or web address].

## 2024-02-02 NOTE — ED Notes (Signed)
Patient leaving the floor in stable condition, AOX4, with his belongings and staff.

## 2024-02-02 NOTE — ED Provider Notes (Signed)
 MSE was initiated and I personally evaluated the patient and placed orders (if any) at  6:26 AM on February 02, 2024.  The patient appears stable so that the remainder of the MSE may be completed by another provider.  Patient presents with dark bloody stool around 3 AM.  No history of the same.  Reports some diffuse abdominal cramping.  Per EMS blood pressure was in the 70s.  He is not on any blood thinners.  He has a prior history of an abdominal aortic aneurysm.  Denies hematemesis.  Does feel little bit lightheaded.   Shon Baton, MD 02/02/24 [id number]

## 2024-02-02 NOTE — Anesthesia Preprocedure Evaluation (Signed)
 Anesthesia Evaluation  Patient identified by MRN, date of birth, ID band Patient awake    Reviewed: Allergy & Precautions, NPO status , Patient's Chart, lab work & pertinent test results  Airway Mallampati: II  TM Distance: >3 FB Neck ROM: Full    Dental no notable dental hx. (+) Edentulous Upper, Edentulous Lower   Pulmonary COPD, Current Smoker   Pulmonary exam normal        Cardiovascular hypertension, + CAD and + Past MI   Rhythm:Regular Rate:Normal     Neuro/Psych   Anxiety     negative neurological ROS     GI/Hepatic Neg liver ROS, PUD,,,GIB   Endo/Other  negative endocrine ROS    Renal/GU negative Renal ROS  negative genitourinary   Musculoskeletal negative musculoskeletal ROS (+)    Abdominal Normal abdominal exam  (+)   Peds  Hematology  (+) Blood dyscrasia, anemia Lab Results      Component                Value               Date                      WBC                      13.1 (H)            02/02/2024                HGB                      7.3 (L)             02/02/2024                HCT                      22.2 (L)            02/02/2024                MCV                      91.4                02/02/2024                PLT                      482 (H)             02/02/2024              Anesthesia Other Findings   Reproductive/Obstetrics                             Anesthesia Physical Anesthesia Plan  ASA: 3  Anesthesia Plan: MAC   Post-op Pain Management:    Induction: Intravenous  PONV Risk Score and Plan: Propofol infusion and Treatment may vary due to age or medical condition  Airway Management Planned: Simple Face Mask and Nasal Cannula  Additional Equipment: None  Intra-op Plan:   Post-operative Plan:   Informed Consent: I have reviewed the patients History and Physical, chart, labs and discussed the procedure including the risks, benefits and  alternatives for the proposed anesthesia with the patient or authorized  representative who has indicated his/her understanding and acceptance.     Dental advisory given  Plan Discussed with: CRNA  Anesthesia Plan Comments:        Anesthesia Quick Evaluation

## 2024-02-02 NOTE — Consult Note (Addendum)
 Consultation  Referring Provider: [facility]/ Hyacinth Meeker Primary Care Physician:  Pcp, No Primary Gastroenterologist: unassigned  Reason for Consultation: Acute major GI bleed  HPI: Anthony Coffey is a 68 y.o. male, with history of coronary artery disease, status post MI 2018 with drug-eluting stents x 2, he says he is not any longer on any antiplatelet medication.  Also with history of COPD, continued tobacco use, hypertension, anxiety and history of infrarenal abdominal aortic aneurysm repair in 2010. Patient has not had any prior history of GI bleeding, no history of previous EGD or colonoscopy.  He presented this morning after he developed acute large-volume dark bloody stools at home which started around 3 AM.  EMS was called, he was found hypotensive with systolic blood pressure of 70 on arrival.  He says he had 2-3 episodes at home and then had 1 more episode after he came to the emergency room.  He has not had any hematemesis.  He says he has been having upper/mid abdominal pain over the past week which has been fairly constant.  No fever or chills. He has been taking Goody powders on a regular basis usually at least 2/day for headaches.  Last hemoglobin from February 2025 was 16, hemoglobin here 7.3 on arrival. Pro time 14.5/INR 1.0 Potassium 3.7 BUN 20/creatinine 0.66  He has been transfused 1 unit of packed RBCs, second unit ordered.  He underwent CTA earlier this morning which does not show any evidence of active GI bleed, but does show transmural ulceration along the posterior wall of the descending duodenum with surrounding soft tissue stranding no extraluminal gas to suggest free perforation cannot exclude contained duodenal ulcer perforation, ulcer measures about 3 cm. 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  Blood pressure currently  109/60   pulse in the 70s   Past Medical History:  Diagnosis Date   Aneurysm (HCC)    Anxiety    CAD (coronary artery disease)    02/01/17 Cath DES--> OM, staged PCI DES -->mLAD, EF 55%   COPD (chronic obstructive pulmonary disease) (HCC)    smoker   Hypertension    high with anxiety   Myocardial infarction [facility])     Past Surgical History:  Procedure Laterality Date   ABDOMINAL AORTIC ANEURYSM REPAIR     CORONARY STENT INTERVENTION N/A 02/01/2017   Procedure: Coronary Stent Intervention;  Surgeon: Lyn Records, MD;  Location: [facility] INVASIVE CV LAB;  Service: Cardiovascular;  Laterality: N/A;   CORONARY STENT INTERVENTION N/A 02/03/2017   Procedure: Coronary Stent Intervention;  Surgeon: Tonny Bollman, MD;  Location: [facility] INVASIVE CV LAB;  Service: Cardiovascular;  Laterality: N/A;   EAR CYST EXCISION Right 06/14/2013   Procedure: CYST REMOVAL RIGHT MANDIBLE;  Surgeon: Georgia Lopes, DDS;  Location: [facility] OR;  Service: Oral Surgery;  Laterality: Right;   INTRAMEDULLARY (IM) NAIL INTERTROCHANTERIC Left 01/22/2020   Procedure: INTRAMEDULLARY (IM) NAIL INTERTROCHANTRIC;  Surgeon: Yolonda Kida, MD;  Location: [facility] OR;  Service: Orthopedics;  Laterality: Left;   LEFT HEART CATH AND CORONARY ANGIOGRAPHY N/A 02/01/2017   Procedure: Left Heart Cath and Coronary Angiography;  Surgeon: Lyn Records, MD;  Location: [facility] INVASIVE CV LAB;  Service: Cardiovascular;  Laterality: N/A;   TONSILLECTOMY     TOOTH EXTRACTION N/A 06/14/2013   Procedure: EXTRACTIONS 17, 23, 26, 27, 29, 32;  Surgeon: Lonzo Cloud  Barbette Merino, DDS;  Location: [facility] OR;  Service: Oral Surgery;  Laterality: N/A;   VASCULAR SURGERY      Prior to Admission medications   Medication Sig Start Date End Date Taking? Authorizing Provider  acetaminophen (TYLENOL) 500 MG tablet Take 1 tablet (500 mg total) by mouth every 6 (six) hours as needed for headache (pain). 02/04/17   Arty Baumgartner, NP  alum & mag hydroxide-simeth (MAALOX MAX) 400-400-40  MG/5ML suspension Take 5 mLs by mouth every 6 (six) hours as needed for indigestion. 02/24/22   Horton, Clabe Seal, DO  atorvastatin (LIPITOR) 80 MG tablet Take 1 tablet (80 mg total) by mouth daily. 11/16/19 01/22/20  Lyn Records, MD  clopidogrel (PLAVIX) 75 MG tablet Take 1 tablet (75 mg total) by mouth daily. 01/24/20   Jerald Kief, MD  fenofibrate 160 MG tablet Take 1 tablet (160 mg total) by mouth daily. 11/16/19   Lyn Records, MD  lisinopril (ZESTRIL) 10 MG tablet Take 1 tablet (10 mg total) by mouth daily. 09/28/19   Marjie Skiff E, PA-C  metoprolol tartrate (LOPRESSOR) 25 MG tablet Take 1 tablet (25 mg total) by mouth 2 (two) times daily. 09/28/19   Marjie Skiff E, PA-C  nitroGLYCERIN (NITROSTAT) 0.4 MG SL tablet Place 1 tablet (0.4 mg total) under the tongue every 5 (five) minutes as needed for chest pain. 01/11/20   Lyn Records, MD  Omega-3 Fatty Acids (FISH OIL) 1000 MG CAPS Take 4 capsules (4,000 mg total) by mouth daily. 07/03/17   Lyn Records, MD  pantoprazole (PROTONIX) 20 MG tablet Take 1 tablet (20 mg total) by mouth daily. 02/24/22   Horton, Clabe Seal, DO  Study - ORION 4 - inclisiran 300 mg/1.29mL or placebo SQ injection (PI-Stuckey) Inject 300 mg into the skin every 6 (six) months. 01/19/20   [provider]    Current Facility-Administered Medications  Medication Dose Route Frequency Provider Last Rate Last Admin   acetaminophen (TYLENOL) tablet 650 mg  650 mg Oral Q6H PRN Clydie Braun, MD       Or   acetaminophen (TYLENOL) suppository 650 mg  650 mg Rectal Q6H PRN Madelyn Flavors A, MD       albuterol (PROVENTIL) (2.5 MG/3ML) 0.083% nebulizer solution 2.5 mg  2.5 mg Nebulization Q6H PRN Smith, Rondell A, MD       pantoprazole (PROTONIX) injection 40 mg  40 mg Intravenous Q8H Horton, Mayer Masker, MD       Followed by   Melene Muller ON 02/05/2024] pantoprazole (PROTONIX) injection 40 mg  40 mg Intravenous Q12H Horton, Mayer Masker, MD       sodium chloride flush  (NS) 0.9 % injection 3 mL  3 mL Intravenous Q12H Smith, Rondell A, MD       Current Outpatient Medications  Medication Sig Dispense Refill   acetaminophen (TYLENOL) 500 MG tablet Take 1 tablet (500 mg total) by mouth every 6 (six) hours as needed for headache (pain). 30 tablet 0   alum & mag hydroxide-simeth (MAALOX MAX) 400-400-40 MG/5ML suspension Take 5 mLs by mouth every 6 (six) hours as needed for indigestion. 355 mL 0   atorvastatin (LIPITOR) 80 MG tablet Take 1 tablet (80 mg total) by mouth daily. 30 tablet 11   clopidogrel (PLAVIX) 75 MG tablet Take 1 tablet (75 mg total) by mouth daily. 30 tablet 11   fenofibrate 160 MG tablet Take 1 tablet (160 mg total) by mouth daily. 30 tablet 11  lisinopril (ZESTRIL) 10 MG tablet Take 1 tablet (10 mg total) by mouth daily. 30 tablet 11   metoprolol tartrate (LOPRESSOR) 25 MG tablet Take 1 tablet (25 mg total) by mouth 2 (two) times daily. 30 tablet 11   nitroGLYCERIN (NITROSTAT) 0.4 MG SL tablet Place 1 tablet (0.4 mg total) under the tongue every 5 (five) minutes as needed for chest pain. 25 tablet 3   Omega-3 Fatty Acids (FISH OIL) 1000 MG CAPS Take 4 capsules (4,000 mg total) by mouth daily.  0   pantoprazole (PROTONIX) 20 MG tablet Take 1 tablet (20 mg total) by mouth daily. 30 tablet 0   Study - ORION 4 - inclisiran 300 mg/1.62mL or placebo SQ injection (PI-Stuckey) Inject 300 mg into the skin every 6 (six) months.      Allergies as of 02/02/2024   (No Known Allergies)    Family History  Problem Relation Age of Onset   Anxiety disorder Brother     Social History   Socioeconomic History   Marital status: Single    Spouse name: Not on file   Number of children: Not on file   Years of education: Not on file   Highest education level: Not on file  Occupational History   Not on file  Tobacco Use   Smoking status: Every Day    Current packs/day: 1.00    Average packs/day: 1 pack/day for 39.0 years (39.0 ttl pk-yrs)    Types:  Cigarettes   Smokeless tobacco: Former  Substance and Sexual Activity   Alcohol use: No   Drug use: No   Sexual activity: Never  Other Topics Concern   Not on file  Social History Narrative   ** Merged History Encounter **       Social Drivers of Corporate investment banker Strain: Not on file  Food Insecurity: Not on file  Transportation Needs: Not on file  Physical Activity: Not on file  Stress: Not on file  Social Connections: Not on file  Intimate Partner Violence: Not At Risk (12/20/2023)   Received from [facility]   HITS    Over the last 12 months how often did your partner physically hurt you?: Never    Over the last 12 months how often did your partner insult you or talk down to you?: Never    Over the last 12 months how often did your partner threaten you with physical harm?: Never    Over the last 12 months how often did your partner scream or curse at you?: Never    Review of Systems: Pertinent positive and negative review of systems were noted in the above HPI section.  All other review of systems was otherwise negative.   Physical Exam: Vital signs in last 24 hours: Temp:  [98.5 F (36.9 C)-99 F (37.2 C)] 98.6 F (37 C) (04/07 0939) Pulse Rate:  [68-98] 68 (04/07 0939) Resp:  [11-25] 16 (04/07 0939) BP: (104-127)/(51-93) 109/62 (04/07 0939) SpO2:  [100 %] 100 % (04/07 0939) Weight:  [79.4 kg] 79.4 kg (04/07 0620)   General:   Alert,  Well-developed, older white male well-nourished, pleasant and cooperative in NAD, pale Head:  Normocephalic and atraumatic. Eyes:  Sclera clear, no icterus.   Conjunctiva pale. Ears:  Normal auditory acuity. Nose:  No deformity, discharge,  or lesions. Mouth:  No deformity or lesions.   Neck:  Supple; no masses or thyromegaly. Lungs:  Clear throughout to auscultation.  Somewhat decreased breath sounds bilaterally  heart:  Regular rate and rhythm; no murmurs, clicks, rubs,  or gallops. Abdomen:  Soft, he is tender in  the hypogastrium with some guarding no rebound, BS active,nonpalp mass or hsm.  Midline incisional scar Rectal: Not done-witnessed dark bloody bowel movement per ER Msk:  Symmetrical without gross deformities. . Pulses:  Normal pulses noted. Extremities:  Without clubbing or edema. Neurologic:  Alert and  oriented x4;  grossly normal neurologically. Skin:  Intact without significant lesions or rashes.. Psych:  Alert and cooperative. Normal mood and affect.  Intake/Output from previous day: 04/06 0701 - 04/07 0700 In: 800 [I.V.:800] Out: -  Intake/Output this shift: No intake/output data recorded.  Lab Results: Recent Labs    02/02/24 0636  WBC 13.1*  HGB 7.3*  HCT 22.2*  PLT 482*   BMET Recent Labs    02/02/24 0636  NA 137  K 3.7  CL 103  CO2 24  GLUCOSE 122*  BUN 20  CREATININE 0.66  CALCIUM 8.5*   LFT Recent Labs    02/02/24 0636  PROT 5.5*  ALBUMIN 2.8*  AST 30  ALT 15  ALKPHOS 55  BILITOT 0.3   PT/INR Recent Labs    02/02/24 0636  LABPROT 14.5  INR 1.1   Hepatitis Panel No results for input(s): "HEPBSAG", "HCVAB", "HEPAIGM", "HEPBIGM" in the last 72 hours.    IMPRESSION:  #36 68 year old white male with acute major upper GI bleed with hypotension secondary to large/3 cm duodenal ulcer which on CTA is transmural along the posterior wall of the descending duodenum with surrounding soft tissue stranding, cannot exclude contained duodenal ulcer perforation but no free perforation noted.  #2 anemia acute secondary to acute GI bleed with 6 g drop in hemoglobin  #3 COPD no oxygen use #4 coronary artery disease status post MI 2018 drug-eluting stents x 2, says he was taken off of antiplatelet agents a couple of years ago #5 history of infrarenal abdominal aortic aneurysm repair 2010 #6 status post ORIF Left femur  PLAN: Keep n.p.o. Transfuse second unit of packed RBCs and keep at least 2 units of packed RBCs available head IV PPI every 6 hours over  the next 24 hours,  Surgical consultation prior to EGD which will be scheduled for this afternoon with Dr. Myrtie Neither.  Discussed with surgery/Michael Macis PA-C. Patient is at high risk for perforation with EGD, and need surgery on standby.  GI will follow closely with you Stop Marlin Canary powder use   Amy Esterwood PA-C 02/02/2024, 11:44 AM  I have taken an interval history, thoroughly reviewed the chart and examined the patient. I agree with the Advanced Practitioner's note, impression and recommendations, and have recorded additional findings, impressions and recommendations below. I performed a substantive portion of this encounter (>50% time spent), including a complete performance of the medical decision making.  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 He has stabilized with volume resuscitation. Right-sided abdominal pain but nonsurgical abdomen. CTA images personally reviewed-this is a large and deep ulcer with surrounding stranding.  This unquestionably presents the increased risks for perforation just from passage of the endoscope and insufflation even if no intervention such as injection cauterization or clipping are performed.  Those interventions are unlikely feasible given the size of the  ulcer and its depth with those risks. However, upper endoscopic evaluation warranted to identify the source and possibly apply some hemostatic spray or gel if necessary. Surgical consultation requested so they can be on standby in case surgical intervention warranted. I spoke with Dr. Janee Morn of the general surgery service who is already aware of the patient (their PA will evaluate the patient as soon as they can). If there is recurrence of GI  bleeding not amenable to endoscopic evaluation, interventional radiology should also be consulted, though I expect they will be limited in ability to perform percutaneous procedure due to the severity of femoral/iliac vascular disease.  Upper endoscopy recommended along with a clear description of the procedure and explanation of the increased risks in his particular clinical scenario, and Mr. Knust was agreeable.  The benefits and risks of the planned procedure(s) were described in detail with the patient or (when appropriate) their health care proxy.  Risks were outlined as including, but not limited to, bleeding, infection, perforation, adverse medication reaction leading to cardiac or pulmonary decompensation, pancreatitis (if ERCP).  The limitation of incomplete mucosal visualization was also discussed.  No guarantees or warranties were given. Patient at increased risk for cardiopulmonary complications of procedure due to medical comorbidities.  He has received 1 out of 2 planned units of PRBCs, vital signs stable at the time of my evaluation, he is on IV PPI and has good peripheral IV access.  Given the radiographic description of this ulcer and his leukocytosis, we are also ordering Zosyn in case there is any degree of contained perforation.  [name] Office:[phone]

## 2024-02-02 NOTE — Consult Note (Signed)
 Anthony Coffey 1956-09-11  [id number].    Requesting MD: Sondra Come, PA-C Chief Complaint/Reason for Consult: Duodenal Ulcer  HPI: Anthony Coffey is a 68 y.o. male who presented to the ED for GI bleed.  Patient reports over the last 1 week he has been having epigastric abdominal pain that is worse with movement and p.o. intake.  He reports he awoke this morning and had 3 episodes of bloody stools that he describes as a mix of black/dark tarry stools and maroon-colored stools. He reports stable epigastric abdominal pain that is unchanged since onset last week. Last bloody bm was at 9am. No hx of similar symptoms in the past. No fever, n/v.   In the ED he underwent workup including a CTA of the A/P that showed a transmural ulceration along the posterior wall of the descending duodenum with surrounding soft tissue stranding but no extraluminal gas or signs of active bleeding. Fecal occult positive. He is currently HDS without fever, tachycardia or hypotension.  WBC 13.1.  H/H 7.3/22.2.  He is receiving 2 units PRBC.  GI has seen and planning for scope today.  They requested general surgery be on board.  He is being admitted to [facility].  Patient reports daily tobacco use.  He notes he uses NSAIDs in the form of BC powders or Goody powders at least twice a week.  He has a remote history of cocaine use.  No alcohol use.  No other drug use.  Denies history of prior endoscopies or colonoscopies.  He is not on any blood thinners.  Past Medical History: AAA s/p repair, CAD, COPD, HTN Prior Abdominal Surgeries: AAA repair  ROS: ROS As above, see hpi  Family History  Problem Relation Age of Onset   Anxiety disorder Brother     Past Medical History:  Diagnosis Date   Aneurysm (HCC)    Anxiety    CAD (coronary artery disease)    02/01/17 Cath DES--> OM, staged PCI DES -->mLAD, EF 55%   COPD (chronic obstructive pulmonary disease) (HCC)    smoker   Hypertension    high with anxiety    Myocardial infarction [facility])     Past Surgical History:  Procedure Laterality Date   ABDOMINAL AORTIC ANEURYSM REPAIR     CORONARY STENT INTERVENTION N/A 02/01/2017   Procedure: Coronary Stent Intervention;  Surgeon: Lyn Records, MD;  Location: [facility] INVASIVE CV LAB;  Service: Cardiovascular;  Laterality: N/A;   CORONARY STENT INTERVENTION N/A 02/03/2017   Procedure: Coronary Stent Intervention;  Surgeon: Tonny Bollman, MD;  Location: [facility] INVASIVE CV LAB;  Service: Cardiovascular;  Laterality: N/A;   EAR CYST EXCISION Right 06/14/2013   Procedure: CYST REMOVAL RIGHT MANDIBLE;  Surgeon: Georgia Lopes, DDS;  Location: [facility] OR;  Service: Oral Surgery;  Laterality: Right;   INTRAMEDULLARY (IM) NAIL INTERTROCHANTERIC Left 01/22/2020   Procedure: INTRAMEDULLARY (IM) NAIL INTERTROCHANTRIC;  Surgeon: Yolonda Kida, MD;  Location[facility] OR;  Service: Orthopedics;  Laterality: Left;   LEFT HEART CATH AND CORONARY ANGIOGRAPHY N/A 02/01/2017   Procedure: Left Heart Cath and Coronary Angiography;  Surgeon: Lyn Records, MD;  Location: [facility] INVASIVE CV LAB;  Service: Cardiovascular;  Laterality: N/A;   TONSILLECTOMY     TOOTH EXTRACTION N/A 06/14/2013   Procedure: EXTRACTIONS 17, 23, 26, 27, 29, 32;  Surgeon: Georgia Lopes, DDS;  Location: [facility] OR;  Service: Oral Surgery;  Laterality: N/A;   VASCULAR SURGERY      Social History:  reports that he has been smoking cigarettes. He has a 39 pack-year smoking history. He has quit using smokeless tobacco. He reports that he does not drink alcohol and does not use drugs.  Allergies: No Known Allergies  (Not in a hospital admission)    Physical Exam: Blood pressure 109/62, pulse 68, temperature 98.6 F (37 C), temperature source Oral, resp. rate 16, height 5\' 9"  (1.753 m), weight 79.4 kg, SpO2 100%. General: pleasant, WD/WN male who is laying in bed in NAD HEENT: head is normocephalic, atraumatic.  Heart: regular, rate, and rhythm.   Lungs: CTAB, no wheezes,  rhonchi, or rales noted.  Respiratory effort nonlabored Abd:  Soft, ND, epigastric ttp without rigidity or guarding. +BS. Umbilical hernia that is soft, reducible and without overlying skin changes. No masses or organomegaly. Prior midline wound well healed.  MS: no BUE or BLE edema Skin: pale, warm and dry  Psych: A&Ox4 with an appropriate affect Neuro: normal speech, thought process intact, moves all extremities, gait not assessed   Results for orders placed or performed during the hospital encounter of 02/02/24 (from the past 48 hours)  CBC with Differential     Status: Abnormal   Collection Time: 02/02/24  6:36 AM  Result Value Ref Range   WBC 13.1 (H) 4.0 - 10.5 K/uL   RBC 2.43 (L) 4.22 - 5.81 MIL/uL   Hemoglobin 7.3 (L) 13.0 - 17.0 g/dL   HCT 40.9 (L) 81.1 - 91.4 %   MCV 91.4 80.0 - 100.0 fL   MCH 30.0 26.0 - 34.0 pg   MCHC 32.9 30.0 - 36.0 g/dL   RDW 78.2 95.6 - 21.3 %   Platelets 482 (H) 150 - 400 K/uL   nRBC 0.0 0.0 - 0.2 %   Neutrophils Relative % 79 %   Neutro Abs 10.6 (H) 1.7 - 7.7 K/uL   Lymphocytes Relative 14 %   Lymphs Abs 1.8 0.7 - 4.0 K/uL   Monocytes Relative 4 %   Monocytes Absolute 0.6 0.1 - 1.0 K/uL   Eosinophils Relative 1 %   Eosinophils Absolute 0.1 0.0 - 0.5 K/uL   Basophils Relative 1 %   Basophils Absolute 0.1 0.0 - 0.1 K/uL   Immature Granulocytes 1 %   Abs Immature Granulocytes 0.08 (H) 0.00 - 0.07 K/uL    Comment: Performed at [facility], [address], [place], [place] [address]  Comprehensive metabolic panel     Status: Abnormal   Collection Time: 02/02/24  6:36 AM  Result Value Ref Range   Sodium 137 135 - 145 mmol/L   Potassium 3.7 3.5 - 5.1 mmol/L   Chloride 103 98 - 111 mmol/L   CO2 24 22 - 32 mmol/L   Glucose, Bld 122 (H) 70 - 99 mg/dL    Comment: Glucose reference range applies only to samples taken after fasting for at least 8 hours.   BUN 20 8 - 23 mg/dL   Creatinine, Ser 8.46 0.61 - 1.24 mg/dL   Calcium 8.5 (L) 8.9 -  10.3 mg/dL   Total Protein 5.5 (L) 6.5 - 8.1 g/dL   Albumin 2.8 (L) 3.5 - 5.0 g/dL   AST 30 15 - 41 U/L   ALT 15 0 - 44 U/L   Alkaline Phosphatase 55 38 - 126 U/L   Total Bilirubin 0.3 0.0 - 1.2 mg/dL   GFR, Estimated >96 >29 mL/min    Comment: (NOTE) Calculated using the CKD-EPI Creatinine Equation (2021)    Anion gap  10 5 - 15    Comment: Performed at [facility], [address], [place], [place] [address]  Protime-INR     Status: None   Collection Time: 02/02/24  6:36 AM  Result Value Ref Range   Prothrombin Time 14.5 11.4 - 15.2 seconds   INR 1.1 0.8 - 1.2    Comment: (NOTE) INR goal varies based on device and disease states. Performed at [facility], [address], [place], [place] [address]   Type and screen [facility]     Status: None (Preliminary result)   Collection Time: 02/02/24  6:36 AM  Result Value Ref Range   ABO/RH(D) O POS    Antibody Screen NEG    Sample Expiration 02/05/2024,2359    Unit Number [id number]    Blood Component Type RED CELLS,LR    Unit division 00    Status of Unit ALLOCATED    Transfusion Status OK TO TRANSFUSE    Crossmatch Result Compatible    Unit Number [id number]    Blood Component Type RED CELLS,LR    Unit division 00    Status of Unit ISSUED    Transfusion Status OK TO TRANSFUSE    Crossmatch Result      Compatible Performed at [facility], [address], [place], [place] [address]   POC occult blood, ED     Status: Abnormal   Collection Time: 02/02/24  6:54 AM  Result Value Ref Range   Fecal Occult Bld POSITIVE (A) NEGATIVE  Prepare RBC (crossmatch)     Status: None   Collection Time: 02/02/24  7:30 AM  Result Value Ref Range   Order Confirmation      ORDER PROCESSED BY BLOOD BANK Performed at [facility], [address] [place], [place] [address]    CT Angio Abd/Pel W and/or Wo Contrast Result Date: 02/02/2024 CLINICAL DATA:  Evaluate for lower GI bleed. EXAM: CTA  ABDOMEN AND PELVIS WITHOUT AND WITH CONTRAST TECHNIQUE: Multidetector CT imaging of the abdomen and pelvis was performed using the standard protocol during bolus administration of intravenous contrast. Multiplanar reconstructed images and MIPs were obtained and reviewed to evaluate the vascular anatomy. RADIATION DOSE REDUCTION: This exam was performed according to the departmental dose-optimization program which includes automated exposure control, adjustment of the mA and/or kV according to patient size and/or use of iterative reconstruction technique. CONTRAST:  80mL OMNIPAQUE IOHEXOL 350 MG/ML SOLN COMPARISON:  None 02/12/2023 FINDINGS: VASCULAR Aorta: Extensive aortic atherosclerosis. Postsurgical changes from previous abdominal aortic aneurysm repair. The infrarenal abdominal aorta measures 3.5 by 3.5 cm, image 72/2. Previously 3.5 x 3.2 cm. Celiac: Patent without evidence of aneurysm, dissection, vasculitis or significant stenosis. SMA: Patent without evidence of aneurysm, dissection, vasculitis or significant stenosis. Renals: Both renal arteries are patent without evidence of aneurysm, dissection, vasculitis, fibromuscular dysplasia or significant stenosis. IMA: Not visualized. Inflow: Patent. Aneurysmal dilatation of the left common iliac artery measures 2.8 cm, image 97/12. Previously 2.7 cm. Right common iliac artery measures 2.6 cm, image 97/12. Previously 2.4 cm. Proximal Outflow: Bilateral common femoral and visualized portions of the superficial and profunda femoral arteries are patent without evidence of aneurysm, dissection, vasculitis or significant stenosis. Veins: No obvious venous abnormality within the limitations of this arterial phase study. Review of the MIP images confirms the above findings. NON-VASCULAR Lower chest: No acute abnormality. Hepatobiliary: No focal liver abnormality is seen. No gallstones, gallbladder wall thickening, or biliary dilatation. Pancreas: Unremarkable.  No  pancreatic ductal dilatation or surrounding inflammatory changes. Spleen: Normal in size without focal abnormality. Adrenals/Urinary Tract: Normal adrenal glands. No kidney stones or signs of obstructive uropathy. Bilateral renal calcifications are likely vascular nature. Bilateral Bosniak class 1 kidney cysts are noted. The largest is in the left kidney measuring 2 cm. No follow-up imaging recommended. Urinary bladder is within normal limits. Stomach/Bowel: Stomach appears normal. Transmural ulceration along the posterior wall of the descending duodenum with surrounding soft tissue stranding, image 66/22 and image 55/12. Ulcer crater measures approximately 2.9 cm. No extraluminal gas identified to suggest free perforation. No signs abnormal intraluminal contrast extravasation to suggest active GI bleeding. No pathologic dilatation of the large or small bowel loops. Lymphatic: No signs of abdominopelvic adenopathy. Reproductive: Mild prostate gland enlargement with nodular thickening indenting the bladder base. Other: No significant free fluid or fluid collections. No signs of pneumoperitoneum. Musculoskeletal: Previous ORIF of the left femur. No acute or suspicious osseous findings. First degree anterolisthesis of L3 on L4. Degenerative disc disease noted at L3-4, L4-5 and L5-S1. IMPRESSION: 1. Transmural ulceration along the posterior wall of the descending duodenum with surrounding soft tissue stranding. No extraluminal gas identified to suggest free perforation. Cannot exclude contained duodenal ulcer perforation. 2. No signs of active GI bleeding. 3. Postsurgical changes from previous abdominal aortic aneurysm repair. Infrarenal abdominal aortic aneurysm measures 3.5 x 3.5 cm. Previously 3.5 x 3.2 cm. 4. 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 Previously 2.7 cm and 2.4 cm respectively. 5.  Aortic Atherosclerosis (ICD10-I70.0).  Electronically Signed   By: Signa Kell M.D.   On: 02/02/2024 10:12    Anti-infectives (From admission, onward)    None       Assessment/Plan UGIB w/ Duodenal Ulcer - CT w/ transmural ulceration along the posterior wall of the descending duodenum with surrounding soft tissue stranding without obvious extraluminal gas. This was done w/o PO contrast.  - Currently HDS w/o fever, tachycardia or hypotension. WBC 13.1. No peritonitis on exam.  - No current indication for emergency surgery - [place] H/H. 7.3/22.2 on last check. Getting 2U PRBC now. Replace for hgb < 7 or hemodynamic changes.  - Agree w/ plans for GI to scope to eval bleeding/duodenal ulcer - Recommend PPI gtt - Recommend nsaid and tobacco cessation. Check H. Pylori when able.   FEN - NPO VTE - SCDs, on hold ID - On Unasyn  AAA s/p repair CAD s/p stent HTN COPD HTN  I reviewed nursing notes, Consultant (GI) notes, hospitalist notes, last 24 h vitals and pain scores, last 48 h intake and output, last 24 h labs and trends, and last 24 h imaging results.  Jacinto Halim, [facility] 02/02/2024, 11:58 AM Please see Amion for pager number during day hours 7:00am-4:30pm

## 2024-02-02 NOTE — Interval H&P Note (Signed)
 History and Physical Interval Note:  02/02/2024 1:29 PM  Anthony Coffey  has presented today for surgery, with the diagnosis of acute GI bleed/ duoudenal ulcer.  The various methods of treatment have been discussed with the patient and family. After consideration of risks, benefits and other options for treatment, the patient has consented to  Procedure(s): EGD (ESOPHAGOGASTRODUODENOSCOPY) (N/A) as a surgical intervention.  The patient's history has been reviewed, patient examined, no change in status, stable for surgery.  I have reviewed the patient's chart and labs.  Questions were answered to the patient's satisfaction.    Patient remains stable in the endoscopy pre-procedure area.  [name]

## 2024-02-02 NOTE — Plan of Care (Signed)

## 2024-02-02 NOTE — Progress Notes (Signed)
 Pt arrived from [facility], A/ox .4.Mar[facility] Kitchenpt denies any pain, MD aware,CCMD called. CHG bath given,no further needs at this time

## 2024-02-02 NOTE — ED Provider Notes (Signed)
 Has hx of duodenal ulcer on CT - no prior endoscopy Hgb of 7.3 - getting 2 units and CT - first time with GI bleed. Needs GI consult and admission - possibly IR if CT shows something else.  I discussed the case with Radiologist Dr. Bradly Chris - they report that the CT shows duodenal ulcer -= no active bleeding - some fat stranding around the ulcer.   Dw Amy Bretta Bang - [facility] -will try to see the patient and get an endoscopy done today Consultation for unassigned medicine made at 10:30 AM    Eber Hong, MD 02/02/24 1031

## 2024-02-02 NOTE — ED Notes (Signed)
 Patient transported to CT

## 2024-02-02 NOTE — ED Provider Notes (Signed)
 [place] EMERGENCY DEPARTMENT AT [facility] Provider Note   CSN: [id number] Arrival date & time: 02/02/24  4098     History  Chief Complaint  Patient presents with   Rectal Bleeding    RENNE Coffey is a 68 y.o. male.  HPI      This is a 68 year old male with a history of ruptured AAA, coronary artery disease, COPD, duodenal ulcer, tobacco use who presents with dark stool.  Patient reports dark bloody stool this morning around 3 AM.  He states he has never had a thing like this before.  However, it does appear that he has had a duodenal ulcer.  He does not drink alcohol.  States he is not on any blood thinners.  He does have Plavix listed on his med list but states that he is not taking this.  He reports some crampy diffuse abdominal pain.  Per EMS, SBP in the 70s.  Home Medications Prior to Admission medications   Medication Sig Start Date End Date Taking? Authorizing Provider  acetaminophen (TYLENOL) 500 MG tablet Take 1 tablet (500 mg total) by mouth every 6 (six) hours as needed for headache (pain). 02/04/17   Arty Baumgartner, NP  alum & mag hydroxide-simeth (MAALOX MAX) 400-400-40 MG/5ML suspension Take 5 mLs by mouth every 6 (six) hours as needed for indigestion. 02/24/22   Broxton Broady, Clabe Seal, DO  atorvastatin (LIPITOR) 80 MG tablet Take 1 tablet (80 mg total) by mouth daily. 11/16/19 01/22/20  Lyn Records, MD  clopidogrel (PLAVIX) 75 MG tablet Take 1 tablet (75 mg total) by mouth daily. 01/24/20   Jerald Kief, MD  fenofibrate 160 MG tablet Take 1 tablet (160 mg total) by mouth daily. 11/16/19   Lyn Records, MD  lisinopril (ZESTRIL) 10 MG tablet Take 1 tablet (10 mg total) by mouth daily. 09/28/19   Marjie Skiff E, PA-C  metoprolol tartrate (LOPRESSOR) 25 MG tablet Take 1 tablet (25 mg total) by mouth 2 (two) times daily. 09/28/19   Marjie Skiff E, PA-C  nitroGLYCERIN (NITROSTAT) 0.4 MG SL tablet Place 1 tablet (0.4 mg total) under the tongue every 5  (five) minutes as needed for chest pain. 01/11/20   Lyn Records, MD  Omega-3 Fatty Acids (FISH OIL) 1000 MG CAPS Take 4 capsules (4,000 mg total) by mouth daily. 07/03/17   Lyn Records, MD  pantoprazole (PROTONIX) 20 MG tablet Take 1 tablet (20 mg total) by mouth daily. 02/24/22   Dreyson Mishkin, Clabe Seal, DO  Study - ORION 4 - inclisiran 300 mg/1.81mL or placebo SQ injection (PI-Stuckey) Inject 300 mg into the skin every 6 (six) months. 01/19/20   [provider]      Allergies    Patient has no known allergies.    Review of Systems   Review of Systems  Constitutional:  Negative for fever.  Gastrointestinal:  Positive for abdominal pain and blood in stool. Negative for nausea, rectal pain and vomiting.  All other systems reviewed and are negative.   Physical Exam Updated Vital Signs BP 114/84   Pulse 73   Temp 98.5 F (36.9 C) (Oral)   Resp 12   Ht 1.753 m (5\' 9" )   Wt 79.4 kg   SpO2 100%   BMI 25.84 kg/m  Physical Exam Vitals and nursing note reviewed.  Constitutional:      Appearance: He is well-developed.     Comments: Pale, nontoxic-appearing  HENT:     Head: Normocephalic  and atraumatic.  Eyes:     Pupils: Pupils are equal, round, and reactive to light.  Cardiovascular:     Rate and Rhythm: Normal rate and regular rhythm.     Heart sounds: Normal heart sounds. No murmur heard. Pulmonary:     Effort: Pulmonary effort is normal. No respiratory distress.     Breath sounds: Normal breath sounds. No wheezing.  Abdominal:     General: Bowel sounds are normal.     Palpations: Abdomen is soft.     Tenderness: There is abdominal tenderness. There is no rebound.     Comments: Diffuse tenderness to palpation, well-healed abdominal scar  Musculoskeletal:     Cervical back: Neck supple.     Right lower leg: No edema.     Left lower leg: No edema.  Lymphadenopathy:     Cervical: No cervical adenopathy.  Skin:    General: Skin is warm and dry.  Neurological:      Mental Status: He is alert and oriented to person, place, and time.  Psychiatric:        Mood and Affect: Mood normal.     ED Results / Procedures / Treatments   Labs (all labs ordered are listed, but only abnormal results are displayed) Labs Reviewed  CBC WITH DIFFERENTIAL/PLATELET  COMPREHENSIVE METABOLIC PANEL WITH GFR  PROTIME-INR  POC OCCULT BLOOD, ED  TYPE AND SCREEN    EKG EKG Interpretation Date/Time:  Monday February 02 2024 06:26:50 EDT Ventricular Rate:  75 PR Interval:  146 QRS Duration:  108 QT Interval:  396 QTC Calculation: 443 R Axis:   86  Text Interpretation: Sinus rhythm Consider right ventricular hypertrophy Confirmed by Ross Marcus ([id number]) on 02/02/2024 6:46:18 AM  Radiology No results found.  Procedures .Critical Care  Performed by: Shon Baton, MD Authorized by: Shon Baton, MD   Critical care provider statement:    Critical care time (minutes):  31   Critical care was necessary to treat or prevent imminent or life-threatening deterioration of the following conditions: acute GI bleed.   Critical care was time spent personally by me on the following activities:  Development of treatment plan with patient or surrogate, discussions with consultants, evaluation of patient's response to treatment, examination of patient, ordering and review of laboratory studies, ordering and review of radiographic studies, ordering and performing treatments and interventions, pulse oximetry, re-evaluation of patient's condition and review of old charts     Medications Ordered in ED Medications  sodium chloride 0.9 % bolus 1,000 mL (has no administration in time range)  pantoprazole (PROTONIX) injection 40 mg (has no administration in time range)    Followed by  pantoprazole (PROTONIX) injection 40 mg (has no administration in time range)    Followed by  pantoprazole (PROTONIX) injection 40 mg (has no administration in time range)  fentaNYL  (SUBLIMAZE) injection 50 mcg (has no administration in time range)  ondansetron (ZOFRAN) injection 4 mg (has no administration in time range)    ED Course/ Medical Decision Making/ A&P Clinical Course as of 02/02/24 0652  Mon Feb 02, 2024  0652 Per nursing, patient had a dark bloody stool at bedside.  Hemoccult positive. [CH]    Clinical Course User Index [CH] Toula Miyasaki, Mayer Masker, MD                                 Medical Decision Making Amount and/or Complexity of Data  Reviewed Labs: ordered.  Risk Prescription drug management.   This patient presents to the ED for concern of GI bleeding, this involves an extensive number of treatment options, and is a complaint that carries with it a high risk of complications and morbidity.  I considered the following differential and admission for this acute, potentially life threatening condition.  The differential diagnosis includes brisk upper GI bleed, lower GI bleed, diverticulosis,  MDM:    This is a 68 year old male who presents with bloody stools.  He is nontoxic.  Vital signs are reassuring with blood pressure 114/84.  He is pale appearing.  Abdominal exam is benign.  He has an old abdominal scar.  Denies taking Plavix.  Did have a dark bloody stool in the emergency department.  Patient was typed and screened.  He was started on Protonix.  He was given a liter of fluids.  Basic lab work is pending.  (Labs, imaging, consults)  Labs: I Ordered, and personally interpreted labs.  The pertinent results include: CBC, CMP, PT/INR, Hemoccult, type and screen  Imaging Studies ordered: I ordered imaging studies including none I independently visualized and interpreted imaging. I agree with the radiologist interpretation  Additional history obtained from chart review.  External records from outside source obtained and reviewed including prior evaluations  Cardiac Monitoring: The patient was maintained on a cardiac monitor.  If on the  cardiac monitor, I personally viewed and interpreted the cardiac monitored which showed an underlying rhythm of: Sinus  Reevaluation: After the interventions noted above, I reevaluated the patient and found that they have :stayed the same  Social Determinants of Health:  lives independently  Disposition: Anticipate admission with GI consultation, signed out to oncoming physician  Co morbidities that complicate the patient evaluation  Past Medical History:  Diagnosis Date   Aneurysm (HCC)    Anxiety    CAD (coronary artery disease)    02/01/17 Cath DES--> OM, staged PCI DES -->mLAD, EF 55%   COPD (chronic obstructive pulmonary disease) (HCC)    smoker   Hypertension    high with anxiety   Myocardial infarction (HCC)      Medicines Meds ordered this encounter  Medications   sodium chloride 0.9 % bolus 1,000 mL   FOLLOWED BY Linked Order Group    pantoprazole (PROTONIX) injection 40 mg    pantoprazole (PROTONIX) injection 40 mg    pantoprazole (PROTONIX) injection 40 mg   fentaNYL (SUBLIMAZE) injection 50 mcg   ondansetron (ZOFRAN) injection 4 mg    I have reviewed the patients home medicines and have made adjustments as needed  Problem List / ED Course: Problem List Items Addressed This Visit   None               Final Clinical Impression(s) / ED Diagnoses Final diagnoses:  None    Rx / DC Orders ED Discharge Orders     None         Shon Baton, MD 02/02/24 [phone]

## 2024-02-03 DIAGNOSIS — I499 Cardiac arrhythmia, unspecified: Secondary | ICD-10-CM | POA: Diagnosis not present

## 2024-02-03 DIAGNOSIS — I959 Hypotension, unspecified: Secondary | ICD-10-CM | POA: Diagnosis present

## 2024-02-03 DIAGNOSIS — I21A1 Myocardial infarction type 2: Secondary | ICD-10-CM | POA: Diagnosis not present

## 2024-02-03 DIAGNOSIS — K921 Melena: Secondary | ICD-10-CM | POA: Diagnosis not present

## 2024-02-03 DIAGNOSIS — Z5941 Food insecurity: Secondary | ICD-10-CM | POA: Diagnosis not present

## 2024-02-03 DIAGNOSIS — T39015A Adverse effect of aspirin, initial encounter: Secondary | ICD-10-CM | POA: Diagnosis present

## 2024-02-03 DIAGNOSIS — Z818 Family history of other mental and behavioral disorders: Secondary | ICD-10-CM | POA: Diagnosis not present

## 2024-02-03 DIAGNOSIS — K625 Hemorrhage of anus and rectum: Secondary | ICD-10-CM | POA: Diagnosis present

## 2024-02-03 DIAGNOSIS — I10 Essential (primary) hypertension: Secondary | ICD-10-CM | POA: Diagnosis present

## 2024-02-03 DIAGNOSIS — D72829 Elevated white blood cell count, unspecified: Secondary | ICD-10-CM | POA: Diagnosis present

## 2024-02-03 DIAGNOSIS — K264 Chronic or unspecified duodenal ulcer with hemorrhage: Secondary | ICD-10-CM | POA: Diagnosis not present

## 2024-02-03 DIAGNOSIS — D62 Acute posthemorrhagic anemia: Secondary | ICD-10-CM | POA: Diagnosis not present

## 2024-02-03 DIAGNOSIS — R519 Headache, unspecified: Secondary | ICD-10-CM | POA: Diagnosis present

## 2024-02-03 DIAGNOSIS — I700 Atherosclerosis of aorta: Secondary | ICD-10-CM | POA: Diagnosis present

## 2024-02-03 DIAGNOSIS — J449 Chronic obstructive pulmonary disease, unspecified: Secondary | ICD-10-CM | POA: Diagnosis present

## 2024-02-03 DIAGNOSIS — Z720 Tobacco use: Secondary | ICD-10-CM | POA: Diagnosis not present

## 2024-02-03 DIAGNOSIS — I2510 Atherosclerotic heart disease of native coronary artery without angina pectoris: Secondary | ICD-10-CM | POA: Diagnosis present

## 2024-02-03 DIAGNOSIS — I7143 Infrarenal abdominal aortic aneurysm, without rupture: Secondary | ICD-10-CM | POA: Diagnosis present

## 2024-02-03 DIAGNOSIS — K922 Gastrointestinal hemorrhage, unspecified: Secondary | ICD-10-CM | POA: Diagnosis present

## 2024-02-03 DIAGNOSIS — F17210 Nicotine dependence, cigarettes, uncomplicated: Secondary | ICD-10-CM | POA: Diagnosis present

## 2024-02-03 DIAGNOSIS — I739 Peripheral vascular disease, unspecified: Secondary | ICD-10-CM | POA: Diagnosis present

## 2024-02-03 DIAGNOSIS — I214 Non-ST elevation (NSTEMI) myocardial infarction: Secondary | ICD-10-CM | POA: Diagnosis not present

## 2024-02-03 DIAGNOSIS — Z955 Presence of coronary angioplasty implant and graft: Secondary | ICD-10-CM | POA: Diagnosis not present

## 2024-02-03 DIAGNOSIS — Z604 Social exclusion and rejection: Secondary | ICD-10-CM | POA: Diagnosis present

## 2024-02-03 DIAGNOSIS — E785 Hyperlipidemia, unspecified: Secondary | ICD-10-CM | POA: Diagnosis present

## 2024-02-03 DIAGNOSIS — F419 Anxiety disorder, unspecified: Secondary | ICD-10-CM | POA: Diagnosis present

## 2024-02-03 DIAGNOSIS — E663 Overweight: Secondary | ICD-10-CM | POA: Diagnosis present

## 2024-02-03 DIAGNOSIS — Z5948 Other specified lack of adequate food: Secondary | ICD-10-CM | POA: Diagnosis not present

## 2024-02-03 DIAGNOSIS — K260 Acute duodenal ulcer with hemorrhage: Secondary | ICD-10-CM | POA: Diagnosis present

## 2024-02-03 DIAGNOSIS — Z9889 Other specified postprocedural states: Secondary | ICD-10-CM | POA: Diagnosis not present

## 2024-02-03 DIAGNOSIS — I252 Old myocardial infarction: Secondary | ICD-10-CM | POA: Diagnosis not present

## 2024-02-03 LAB — CBC
HCT: 21.7 % — ABNORMAL LOW (ref 39.0–52.0)
HCT: 22.2 % — ABNORMAL LOW (ref 39.0–52.0)
Hemoglobin: 7.5 g/dL — ABNORMAL LOW (ref 13.0–17.0)
Hemoglobin: 7.6 g/dL — ABNORMAL LOW (ref 13.0–17.0)
MCH: 30.1 pg (ref 26.0–34.0)
MCH: 29.7 pg (ref 26.0–34.0)
MCHC: 34.6 g/dL (ref 30.0–36.0)
MCHC: 34.2 g/dL (ref 30.0–36.0)
MCV: 87.1 fL (ref 80.0–100.0)
MCV: 86.7 fL (ref 80.0–100.0)
Platelets: 328 10*3/uL (ref 150–400)
Platelets: 359 10*3/uL (ref 150–400)
RBC: 2.49 MIL/uL — ABNORMAL LOW (ref 4.22–5.81)
RBC: 2.56 MIL/uL — ABNORMAL LOW (ref 4.22–5.81)
RDW: 14.1 % (ref 11.5–15.5)
RDW: 14.2 % (ref 11.5–15.5)
WBC: 9.9 10*3/uL (ref 4.0–10.5)
WBC: 9.4 10*3/uL (ref 4.0–10.5)
nRBC: 0 % (ref 0.0–0.2)
nRBC: 0 % (ref 0.0–0.2)

## 2024-02-03 LAB — BASIC METABOLIC PANEL WITH GFR
Anion gap: 9 (ref 5–15)
BUN: 9 mg/dL (ref 8–23)
CO2: 23 mmol/L (ref 22–32)
Calcium: 8.3 mg/dL — ABNORMAL LOW (ref 8.9–10.3)
Chloride: 106 mmol/L (ref 98–111)
Creatinine, Ser: 0.57 mg/dL — ABNORMAL LOW (ref 0.61–1.24)
GFR, Estimated: >60 mL/min (ref >60)
Glucose, Bld: 94 mg/dL (ref 70–99)
Potassium: 3.5 mmol/L (ref 3.5–5.1)
Sodium: 138 mmol/L (ref 135–145)

## 2024-02-03 LAB — HEMOGLOBIN AND HEMATOCRIT, BLOOD
HCT: 22.9 % — ABNORMAL LOW (ref 39.0–52.0)
Hemoglobin: 8 g/dL — ABNORMAL LOW (ref 13.0–17.0)

## 2024-02-03 LAB — SURGICAL PATHOLOGY

## 2024-02-03 MED ORDER — SODIUM CHLORIDE (PF) 0.9 % IJ SOLN
INTRAMUSCULAR | Status: AC
  Administered 2024-02-03: 10 mL
  Filled 2024-02-03: qty 10

## 2024-02-03 MED ORDER — HYDROXYZINE HCL 10 MG PO TABS
10.0000 mg | ORAL_TABLET | Freq: Three times a day (TID) | ORAL | Status: AC | PRN
  Administered 2024-02-03 – 2024-02-04 (×2): 10 mg via ORAL
  Filled 2024-02-03 (×3): qty 1

## 2024-02-03 NOTE — Progress Notes (Addendum)
 Patient ID: Anthony Coffey, male   DOB: 10-25-1956, 68 y.o.   MRN: [id number]    Progress Note   Subjective   Day # 2 CC; acute major GI bleed with hypotension  CTA was done through the emergency room yesterday which did not show any evidence of active bleeding but did show a transmural ulceration along the posterior wall of the descending duodenum with soft tissue stranding, no extraluminal gas to suggest free perforation.  Ulcer measured about 3 cm.  Also noted stable changes post abdominal aortic aneurysm repair.  EGD yesterday which showed acquired benign-appearing moderate stenosis in the duodenal bulb, on the distal side of that there is a large ulcer encompassing the entire distal duodenal bulb and the sweep.  No evidence for active bleeding, no visible vessel, ulcer about 30 mm in size with large overlying clot.  The area was treated with hemostatic spray, gastric biopsies done.  Patient has been stable overnight, continues to have some dark bowel movements  Labs today-WBC 9.9/hemoglobin 7.5/hematocrit 21.7-stable Creatinine 0.57  Patient says he is feeling better, less pain than he was having on admission 1 black bowel movement this morning.  He has tolerated clear liquids without difficulty   Objective   Vital signs in last 24 hours: Temp:  [97.2 F (36.2 C)-98.6 F (37 C)] 97.7 F (36.5 C) (04/08 0839) Pulse Rate:  [52-74] 62 (04/08 0839) Resp:  [12-26] 12 (04/08 0839) BP: (97-122)/(47-70) 103/63 (04/08 0839) SpO2:  [97 %-100 %] 100 % (04/08 0310) Weight:  [79.4 kg] 79.4 kg (04/07 1317)   General:    Older white male in NAD, pale Heart:  Regular rate and rhythm; no murmurs Lungs: Respirations even and unlabored, lungs CTA bilaterally Abdomen:  Soft, there is some tenderness in the hypogastrium, no guarding or rebound today. Normal bowel sounds. Extremities:  Without edema. Neurologic:  Alert and oriented,  grossly normal neurologically. Psych:  Cooperative. Normal mood  and affect.  Intake/Output from previous day: 04/07 0701 - 04/08 0700 In: 1175.8 [I.V.:250; Blood:925.8] Out: -  Intake/Output this shift: No intake/output data recorded.  Lab Results: Recent Labs    02/02/24 0636 02/02/24 1748 02/03/24 0322 02/03/24 0919  WBC 13.1*  --  9.9 9.4  HGB 7.3* 9.0* 7.5* 7.6*  HCT 22.2* 26.3* 21.7* 22.2*  PLT 482*  --  328 359   BMET Recent Labs    02/02/24 0636 02/03/24 0322  NA 137 138  K 3.7 3.5  CL 103 106  CO2 24 23  GLUCOSE 122* 94  BUN 20 9  CREATININE 0.66 0.57*  CALCIUM 8.5* 8.3*   LFT Recent Labs    02/02/24 0636  PROT 5.5*  ALBUMIN 2.8*  AST 30  ALT 15  ALKPHOS 55  BILITOT 0.3   PT/INR Recent Labs    02/02/24 0636  LABPROT 14.5  INR 1.1    Studies/Results: CT Angio Abd/Pel W and/or Wo Contrast Result Date: 02/02/2024 CLINICAL DATA:  Evaluate for lower GI bleed. EXAM: CTA ABDOMEN AND PELVIS WITHOUT AND WITH CONTRAST TECHNIQUE: Multidetector CT imaging of the abdomen and pelvis was performed using the standard protocol during bolus administration of intravenous contrast. Multiplanar reconstructed images and MIPs were obtained and reviewed to evaluate the vascular anatomy. RADIATION DOSE REDUCTION: This exam was performed according to the departmental dose-optimization program which includes automated exposure control, adjustment of the mA and/or kV according to patient size and/or use of iterative reconstruction technique. CONTRAST:  80mL OMNIPAQUE IOHEXOL 350 MG/ML SOLN  COMPARISON:  None 02/12/2023 FINDINGS: VASCULAR Aorta: Extensive aortic atherosclerosis. Postsurgical changes from previous abdominal aortic aneurysm repair. The infrarenal abdominal aorta measures 3.5 by 3.5 cm, image 72/2. Previously 3.5 x 3.2 cm. Celiac: Patent without evidence of aneurysm, dissection, vasculitis or significant stenosis. SMA: Patent without evidence of aneurysm, dissection, vasculitis or significant stenosis. Renals: Both renal arteries  are patent without evidence of aneurysm, dissection, vasculitis, fibromuscular dysplasia or significant stenosis. IMA: Not visualized. Inflow: Patent. Aneurysmal dilatation of the left common iliac artery measures 2.8 cm, image 97/12. Previously 2.7 cm. Right common iliac artery measures 2.6 cm, image 97/12. Previously 2.4 cm. Proximal Outflow: Bilateral common femoral and visualized portions of the superficial and profunda femoral arteries are patent without evidence of aneurysm, dissection, vasculitis or significant stenosis. Veins: No obvious venous abnormality within the limitations of this arterial phase study. Review of the MIP images confirms the above findings. NON-VASCULAR Lower chest: No acute abnormality. Hepatobiliary: No focal liver abnormality is seen. No gallstones, gallbladder wall thickening, or biliary dilatation. Pancreas: Unremarkable. No pancreatic ductal dilatation or surrounding inflammatory changes. Spleen: Normal in size without focal abnormality. Adrenals/Urinary Tract: Normal adrenal glands. No kidney stones or signs of obstructive uropathy. Bilateral renal calcifications are likely vascular nature. Bilateral Bosniak class 1 kidney cysts are noted. The largest is in the left kidney measuring 2 cm. No follow-up imaging recommended. Urinary bladder is within normal limits. Stomach/Bowel: Stomach appears normal. Transmural ulceration along the posterior wall of the descending duodenum with surrounding soft tissue stranding, image 66/22 and image 55/12. Ulcer crater measures approximately 2.9 cm. No extraluminal gas identified to suggest free perforation. No signs abnormal intraluminal contrast extravasation to suggest active GI bleeding. No pathologic dilatation of the large or small bowel loops. Lymphatic: No signs of abdominopelvic adenopathy. Reproductive: Mild prostate gland enlargement with nodular thickening indenting the bladder base. Other: No significant free fluid or fluid  collections. No signs of pneumoperitoneum. Musculoskeletal: Previous ORIF of the left femur. No acute or suspicious osseous findings. First degree anterolisthesis of L3 on L4. Degenerative disc disease noted at L3-4, L4-5 and L5-S1. IMPRESSION: 1. Transmural ulceration along the posterior wall of the descending duodenum with surrounding soft tissue stranding. No extraluminal gas identified to suggest free perforation. Cannot exclude contained duodenal ulcer perforation. 2. No signs of active GI bleeding. 3. Postsurgical changes from previous abdominal aortic aneurysm repair. Infrarenal abdominal aortic aneurysm measures 3.5 x 3.5 cm. Previously 3.5 x 3.2 cm. 4. 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 Previously 2.7 cm and 2.4 cm respectively. 5.  Aortic Atherosclerosis (ICD10-I70.0). Electronically Signed   By: Signa Kell M.D.   On: 02/02/2024 10:12       Assessment / Plan:     #73 68 year old white male presenting with acute GI bleed with melena and hypotension in setting of chronic Goody powder use.  Found to have a very large/3 cm duodenal bulb ulceration with adherent clot at EGD yesterday and this was treated with hemostatic spray No active bleeding at the time of procedure and no visible vessel  Has been stable overnight, hemoglobin overall stable  #2 anemia acute secondary to acute GI blood loss #3 history of previous abdominal aortic aneurysm repair #4 coronary artery disease, status post MI 2018 with drug-eluting stents #5 COPD no oxygen use #6.  Tobacco use  Plan; full liquid diet for now Continue IV PPI every 8 hours Need to trend hemoglobin  and transfuse as indicated for hemoglobin 7 or less Follow-up gastric biopsies Discussed again with patient today that it is imperative that he stop Goody powder/BC powder use, and no NSAIDs, can use Tylenol for headaches GI will follow-up in a.m.     Principal  Problem:   GI bleed Active Problems:   Tobacco abuse   Acute duodenal ulcer with bleeding   ABLA (acute blood loss anemia)   Transient hypotension   History of AAA (abdominal aortic aneurysm) repair   Chronic duodenal ulcer with bleeding   Melena   Leukocytosis     LOS: 0 days   Amy Esterwood  02/03/2024, 11:53 AM  I have taken an interval history, thoroughly reviewed the chart and examined the patient. I agree with the Advanced Practitioner's note, impression and recommendations, and have recorded additional findings, impressions and recommendations below. I performed a substantive portion of this encounter (>50% time spent), including a complete performance of the medical decision making.  My additional thoughts are as follows:  Hemodynamically stable since yesterday's procedure.  Black stool last evening, and he reports another earlier this morning.  Tolerating a clear liquid diet well so far.  Difficult to know if there could still be some intermittent oozing from this ulcer with fluctuating hemoglobin that could also be equilibration after transfusion. It is a very large ulcer with a densely adherent clot but is at high risk of rebleeding.  If so, there is no further endoscopic treatment available. I am hopeful that with more time and acid suppression that this will settle down and not need IR or operative management.  Treatment as outlined above. GI will follow   [name] Office:[phone]

## 2024-02-03 NOTE — Care Management Obs Status (Signed)
 MEDICARE OBSERVATION STATUS NOTIFICATION   Patient Details  Name: Anthony Coffey MRN: [id number] Date of Birth: [birth year]   Medicare Observation Status Notification Given:  Yes    Renie Ora 02/03/2024, 11:35 AM

## 2024-02-03 NOTE — Plan of Care (Signed)

## 2024-02-03 NOTE — Progress Notes (Signed)
 Mobility Specialist Progress Note:    02/03/24 0946  Mobility  Activity Ambulated with assistance to bathroom  Level of Assistance Standby assist, set-up cues, supervision of patient - no hands on  Assistive Device None  Distance Ambulated (ft) 12 ft  Activity Response Tolerated well  Mobility Referral Yes  Mobility visit 1 Mobility  Mobility Specialist Start Time (ACUTE ONLY) 0940  Mobility Specialist Stop Time (ACUTE ONLY) 0946  Mobility Specialist Time Calculation (min) (ACUTE ONLY) 6 min   Pt received ambulating from bathroom to bed. No AD required, SV for safety. Pt declined further ambulation at this time. Will f/u if time permits, left with all needs met.    Feliciana Rossetti Mobility Specialist Please contact via Special educational needs teacher or  Rehab office at [phone]

## 2024-02-03 NOTE — Progress Notes (Signed)
 Mobility Specialist Progress Note:    02/03/24 1048  Mobility  Activity Ambulated with assistance in room;Ambulated with assistance in hallway  Level of Assistance Standby assist, set-up cues, supervision of patient - no hands on  Assistive Device None  Distance Ambulated (ft) 120 ft  Activity Response Tolerated well  Mobility Referral Yes  Mobility visit 1 Mobility  Mobility Specialist Start Time (ACUTE ONLY) 1045  Mobility Specialist Stop Time (ACUTE ONLY) 1048  Mobility Specialist Time Calculation (min) (ACUTE ONLY) 3 min   Pt received at doorway, reporting anxiety and hunger. Agreeable to ambulate in hallway. No AD required, SBA for safety and d/t slight unsteadiness. Pt denies dizziness or pain. Returned pt to room, left with all needs met.    Anthony Coffey Mobility Specialist Please contact via Special educational needs teacher or  Rehab office at [phone]

## 2024-02-03 NOTE — Anesthesia Postprocedure Evaluation (Signed)
 Anesthesia Post Note  Patient: Anthony Coffey  Procedure(s) Performed: EGD (ESOPHAGOGASTRODUODENOSCOPY)     Patient location during evaluation: PACU Anesthesia Type: MAC Level of consciousness: awake and alert Pain management: pain level controlled Vital Signs Assessment: post-procedure vital signs reviewed and stable Respiratory status: spontaneous breathing, nonlabored ventilation, respiratory function stable and patient connected to nasal cannula oxygen Cardiovascular status: stable and blood pressure returned to baseline Postop Assessment: no apparent nausea or vomiting Anesthetic complications: no   No notable events documented.  Last Vitals:  Vitals:   02/02/24 2301 02/03/24 0310  BP: (!) 104/59 107/64  Pulse: 60 61  Resp: 18 15  Temp: 36.7 C 36.9 C  SpO2: 100% 100%    Last Pain:  Vitals:   02/03/24 0743  TempSrc:   PainSc: 0-No pain                 Earl Lites P Ourania Hamler

## 2024-02-03 NOTE — Progress Notes (Signed)
 Endo note reviewed.   Acquired duodenal stenoses and a non-bleeding duodenal ulcer with adherent clot for which hemostatic spray applied. No mention of perforation on endo. If need further w/u for duodenal stenosis could consider UGI to make sure patent but defer that to GI.   On chart review he is now HDS w/o tachycardia or hypotension. Not on pressors. Hgb 7.6 from 7.5 this am. Last transfusion was yesterday. WBC wnl.   Reviewed case with attending. Our team will sign off. Care per GI. Discussed w/ TRH. Please call us back with any questions or concerns.   Jacinto Halim, PA-C

## 2024-02-03 NOTE — Progress Notes (Addendum)
 PROGRESS NOTE   Anthony Coffey  MWU:[id number]    DOB: [birth year]    DOA: 02/02/2024  PCP: Pcp, No   I have briefly reviewed patients previous medical records in [facility].  Chief Complaint  Patient presents with   Rectal Bleeding    Brief Hospital Course:  68 year old male with medical history significant for hypertension, CAD/MI s/p staged PCI to OM and LAD 2018, COPD, tobacco abuse, abdominal aneurysm s/p repair, anxiety, uses Goody's powder twice weekly, presented to ED on 02/02/2024 with complaints of dark and bright red blood in stool and abdominal pain.  In ED, soft blood pressures of 104/51, hemoglobin 7.3, FOBT positive.  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  [facility] and general surgery were consulted.  EGD on 4/7 showed a large ulcer encompassing the entire distal duodenal bulb and the sweep without active bleeding, no visible vessel, ulcer about 30 mm in size with large overlying clot, treated with hemostatic spray.  In the absence of perforation, general surgery signed off.  Slowly advancing diet, monitor H&H closely and transfuse as needed.   Assessment & Plan:  Principal Problem:   GI bleed Active Problems:   Acute duodenal ulcer with bleeding   ABLA (acute blood loss anemia)   Leukocytosis   Transient hypotension   History of AAA (abdominal aortic aneurysm) repair   Tobacco abuse   Chronic duodenal ulcer with bleeding   Melena   Acute upper GI bleed secondary to large duodenal ulcer History as noted above CT angiogram of the abdomen and pelvis: Transmural ulceration along the posterior wall of the descending duodenum with surrounding soft tissue stranding.  No extraluminal gas identified to suggest free perforation.  No signs of active GI bleeding. EGD 4/7 showed large, 30 mm in largest diameter, duodenal  ulcer encompassing the entire distal duodenal bulb and sweep, with a large overlying clot.  Treated with hemostatic spray Per GI follow-up today, advancing to full liquid diet, continue IV PPI every 8 hours, trend H&H and transfuse for hemoglobin 7 g or less, gastric biopsies pending and absolute cessation of NSAIDs This MD counseled patient extensively regarding avoiding any form of NSAIDs including BC powder and Goody's powder. General surgery signed off in the absence of perforation. Bleeding seems to have stopped. Transient leukocytosis, likely stress response-has resolved.  Ongoing soft blood pressures.  Acute blood loss anemia Hemoglobin 16.4 on 02/12/2023.  Presented with hemoglobin of 7.3. S/p 2 units PRBC transfusion on admission Hemoglobin stable in the mid 7 g range for the last 2 readings. Continue to monitor H&H every 6 hours and transfuse for hemoglobin 7 g or less  History of AAA. Patient is status post aneurysmal repair and follows with vascular surgery.  CT imaging noted Postsurgical changes from previous abdominal aortic aneurysm repair. Infrarenal abdominal aortic aneurysm measures 3.5 x 3.5 cm. Previously 3.5 x 3.2 cm. 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 Previously 2.7cm and 2.4 cm respectively. -Continue outpatient follow-up with vascular surgery   COPD/tobacco abuse Patient reports smoking cigarettes.  Cessation counseled No clinical bronchospasm  CAD s/p stents No anginal symptoms Interestingly on no antiplatelets or meds PTA  Hypertension/hypotension Not on meds PTA.  Soft blood pressures here.  Monitor  Anxiety disorder Patient report significant anxiety and requesting meds.  Started hydroxyzine as needed while hospitalized.  Body mass index is 25.85 kg/m.   DVT prophylaxis: SCDs Start: 02/02/24 1046     Code Status: Full Code:  Family Communication: None at  bedside Disposition:  Status is: Observation The patient will require care spanning > 2 midnights and should be moved to inpatient because: Admitted with acute GI bleed due to very large duodenal ulcer, treatment as above, slowly advancing diet, continuing IV PPI with close H&H monitoring every 6 hours and care expected to cross 2 midnights.     Consultants:   General Surgery [facility]  Procedures:   EGD as above on 4/7  Antimicrobials:      Subjective:  Hungry this morning and when this MD went to see him, was having his clear liquid diet/ice cream.  Did not report abdominal pain.  Reported dark stool this morning but none since.  Denied chest pain, dyspnea, dizziness or lightheadedness.  Objective:   Vitals:   02/02/24 1951 02/02/24 2301 02/03/24 0310 02/03/24 0839  BP: 110/61 (!) 104/59 107/64 103/63  Pulse: 69 60 61 62  Resp: 17 18 15 12   Temp: 98.1 F (36.7 C) 98.1 F (36.7 C) 98.5 F (36.9 C) 97.7 F (36.5 C)  TempSrc: Oral Oral Oral Oral  SpO2: 99% 100% 100%   Weight:      Height:        General exam: Middle-age male, moderately built and nourished sitting up comfortably in bed without distress. Respiratory system: Clear to auscultation. Respiratory effort normal. Cardiovascular system: S1 & S2 heard, RRR. No JVD, murmurs, rubs, gallops or clicks. No pedal edema.  Telemetry personally reviewed: Sinus rhythm in the 60s. Gastrointestinal system: Abdomen is nondistended, soft and nontender. No organomegaly or masses felt. Normal bowel sounds heard. Central nervous system: Alert and oriented. No focal neurological deficits. Extremities: Symmetric 5 x 5 power. Skin: No rashes, lesions or ulcers Psychiatry: Judgement and insight appear normal. Mood & affect appropriate.     Data Reviewed:   I have personally reviewed following labs and imaging studies   CBC: Recent Labs  Lab 02/02/24 0636 02/02/24 1748 02/03/24 0322 02/03/24 0919  WBC 13.1*  --  9.9 9.4   NEUTROABS 10.6*  --   --   --   HGB 7.3* 9.0* 7.5* 7.6*  HCT 22.2* 26.3* 21.7* 22.2*  MCV 91.4  --  87.1 86.7  PLT 482*  --  328 359    Basic Metabolic Panel: Recent Labs  Lab 02/02/24 0636 02/03/24 0322  NA 137 138  K 3.7 3.5  CL 103 106  CO2 24 23  GLUCOSE 122* 94  BUN 20 9  CREATININE 0.66 0.57*  CALCIUM 8.5* 8.3*    Liver Function Tests: Recent Labs  Lab 02/02/24 0636  AST 30  ALT 15  ALKPHOS 55  BILITOT 0.3  PROT 5.5*  ALBUMIN 2.8*    CBG: No results for input(s): "GLUCAP" in the last 168 hours.  Microbiology Studies:  No results found for this or any previous visit (from the past 240 hours).  Radiology Studies:  CT Angio Abd/Pel W and/or Wo Contrast Result Date: 02/02/2024 CLINICAL DATA:  Evaluate for lower GI bleed. EXAM: CTA ABDOMEN AND PELVIS WITHOUT AND WITH CONTRAST TECHNIQUE: Multidetector CT imaging of the abdomen and pelvis was performed using the standard protocol during bolus administration of intravenous contrast. Multiplanar reconstructed images and MIPs were obtained and reviewed to evaluate the vascular anatomy. RADIATION DOSE REDUCTION: This exam was performed  according to the departmental dose-optimization program which includes automated exposure control, adjustment of the mA and/or kV according to patient size and/or use of iterative reconstruction technique. CONTRAST:  80mL OMNIPAQUE IOHEXOL 350 MG/ML SOLN COMPARISON:  None 02/12/2023 FINDINGS: VASCULAR Aorta: Extensive aortic atherosclerosis. Postsurgical changes from previous abdominal aortic aneurysm repair. The infrarenal abdominal aorta measures 3.5 by 3.5 cm, image 72/2. Previously 3.5 x 3.2 cm. Celiac: Patent without evidence of aneurysm, dissection, vasculitis or significant stenosis. SMA: Patent without evidence of aneurysm, dissection, vasculitis or significant stenosis. Renals: Both renal arteries are patent without evidence of aneurysm, dissection, vasculitis, fibromuscular dysplasia or  significant stenosis. IMA: Not visualized. Inflow: Patent. Aneurysmal dilatation of the left common iliac artery measures 2.8 cm, image 97/12. Previously 2.7 cm. Right common iliac artery measures 2.6 cm, image 97/12. Previously 2.4 cm. Proximal Outflow: Bilateral common femoral and visualized portions of the superficial and profunda femoral arteries are patent without evidence of aneurysm, dissection, vasculitis or significant stenosis. Veins: No obvious venous abnormality within the limitations of this arterial phase study. Review of the MIP images confirms the above findings. NON-VASCULAR Lower chest: No acute abnormality. Hepatobiliary: No focal liver abnormality is seen. No gallstones, gallbladder wall thickening, or biliary dilatation. Pancreas: Unremarkable. No pancreatic ductal dilatation or surrounding inflammatory changes. Spleen: Normal in size without focal abnormality. Adrenals/Urinary Tract: Normal adrenal glands. No kidney stones or signs of obstructive uropathy. Bilateral renal calcifications are likely vascular nature. Bilateral Bosniak class 1 kidney cysts are noted. The largest is in the left kidney measuring 2 cm. No follow-up imaging recommended. Urinary bladder is within normal limits. Stomach/Bowel: Stomach appears normal. Transmural ulceration along the posterior wall of the descending duodenum with surrounding soft tissue stranding, image 66/22 and image 55/12. Ulcer crater measures approximately 2.9 cm. No extraluminal gas identified to suggest free perforation. No signs abnormal intraluminal contrast extravasation to suggest active GI bleeding. No pathologic dilatation of the large or small bowel loops. Lymphatic: No signs of abdominopelvic adenopathy. Reproductive: Mild prostate gland enlargement with nodular thickening indenting the bladder base. Other: No significant free fluid or fluid collections. No signs of pneumoperitoneum. Musculoskeletal: Previous ORIF of the left femur. No acute  or suspicious osseous findings. First degree anterolisthesis of L3 on L4. Degenerative disc disease noted at L3-4, L4-5 and L5-S1. IMPRESSION: 1. Transmural ulceration along the posterior wall of the descending duodenum with surrounding soft tissue stranding. No extraluminal gas identified to suggest free perforation. Cannot exclude contained duodenal ulcer perforation. 2. No signs of active GI bleeding. 3. Postsurgical changes from previous abdominal aortic aneurysm repair. Infrarenal abdominal aortic aneurysm measures 3.5 x 3.5 cm. Previously 3.5 x 3.2 cm. 4. 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 Previously 2.7 cm and 2.4 cm respectively. 5.  Aortic Atherosclerosis (ICD10-I70.0). Electronically Signed   By: Signa Kell M.D.   On: 02/02/2024 10:12    Scheduled Meds:    sodium chloride   Intravenous Once   nicotine  21 mg Transdermal Daily   pantoprazole (PROTONIX) IV  40 mg Intravenous Q8H   Followed by   Melene Muller ON 02/05/2024] pantoprazole (PROTONIX) IV  40 mg Intravenous Q12H   sodium chloride flush  3 mL Intravenous Q12H    Continuous Infusions:     LOS: 0 days     Marcellus Scott, MD,  FACP, [facility], [facility], [facility]   [facility], [place]  To contact the attending provider between 7A-7P or the covering provider during after hours 7P-7A, please log into the web site [email or web address] and access using universal [place] password for that web site. If you do not have the password, please call the hospital operator.  02/03/2024, 2:10 PM

## 2024-02-04 ENCOUNTER — Encounter (HOSPITAL_COMMUNITY): Payer: Self-pay | Admitting: Gastroenterology

## 2024-02-04 ENCOUNTER — Inpatient Hospital Stay (HOSPITAL_COMMUNITY)

## 2024-02-04 DIAGNOSIS — I2510 Atherosclerotic heart disease of native coronary artery without angina pectoris: Secondary | ICD-10-CM

## 2024-02-04 DIAGNOSIS — K264 Chronic or unspecified duodenal ulcer with hemorrhage: Secondary | ICD-10-CM | POA: Diagnosis not present

## 2024-02-04 DIAGNOSIS — K921 Melena: Secondary | ICD-10-CM | POA: Diagnosis not present

## 2024-02-04 DIAGNOSIS — D62 Acute posthemorrhagic anemia: Secondary | ICD-10-CM | POA: Diagnosis not present

## 2024-02-04 DIAGNOSIS — Z9889 Other specified postprocedural states: Secondary | ICD-10-CM | POA: Diagnosis not present

## 2024-02-04 DIAGNOSIS — Z720 Tobacco use: Secondary | ICD-10-CM | POA: Diagnosis not present

## 2024-02-04 DIAGNOSIS — F419 Anxiety disorder, unspecified: Secondary | ICD-10-CM

## 2024-02-04 DIAGNOSIS — J449 Chronic obstructive pulmonary disease, unspecified: Secondary | ICD-10-CM

## 2024-02-04 LAB — HEMOGLOBIN AND HEMATOCRIT, BLOOD
HCT: 24.3 % — ABNORMAL LOW (ref 39.0–52.0)
HCT: 29 % — ABNORMAL LOW (ref 39.0–52.0)
HCT: 23.6 % — ABNORMAL LOW (ref 39.0–52.0)
Hemoglobin: 8.4 g/dL — ABNORMAL LOW (ref 13.0–17.0)
Hemoglobin: 9.9 g/dL — ABNORMAL LOW (ref 13.0–17.0)
Hemoglobin: 8.1 g/dL — ABNORMAL LOW (ref 13.0–17.0)

## 2024-02-04 MED ORDER — PANTOPRAZOLE SODIUM 40 MG PO TBEC: 40.0000 mg | DELAYED_RELEASE_TABLET | Freq: Two times a day (BID) | ORAL | Status: DC

## 2024-02-04 MED ORDER — NITROGLYCERIN 0.4 MG SL SUBL
0.4000 mg | SUBLINGUAL_TABLET | SUBLINGUAL | Status: DC | PRN
  Administered 2024-02-04 (×2): 0.4 mg via SUBLINGUAL
  Filled 2024-02-04 (×2): qty 1

## 2024-02-04 MED ORDER — SODIUM CHLORIDE (PF) 0.9 % IJ SOLN
INTRAMUSCULAR | Status: AC
Start: 2024-02-04 — End: 2024-02-04
  Administered 2024-02-04: 10 mL
  Filled 2024-02-04: qty 10

## 2024-02-04 MED ORDER — HYDROXYZINE HCL 10 MG PO TABS
10.0000 mg | ORAL_TABLET | Freq: Three times a day (TID) | ORAL | Status: DC | PRN
  Administered 2024-02-04 – 2024-02-08 (×9): 10 mg via ORAL
  Filled 2024-02-04 (×8): qty 1

## 2024-02-04 NOTE — Plan of Care (Signed)

## 2024-02-04 NOTE — Progress Notes (Signed)
 PROGRESS NOTE    ASER NYLUND  ZOX:[id number] DOB: 07-Aug-1956 DOA: 02/02/2024 PCP: Pcp, No    Chief Complaint  Patient presents with   Rectal Bleeding    Brief Narrative:  68 year old male with medical history significant for hypertension, CAD/MI s/p staged PCI to OM and LAD 2018, COPD, tobacco abuse, abdominal aneurysm s/p repair, anxiety, uses Goody's powder twice weekly, presented to ED on 02/02/2024 with complaints of dark and bright red blood in stool and abdominal pain.  In ED, soft blood pressures of 104/51, hemoglobin 7.3, FOBT positive.  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  [facility] and general surgery were consulted.  EGD on 4/7 showed a large ulcer encompassing the entire distal duodenal bulb and the sweep without active bleeding, no visible vessel, ulcer about 30 mm in size with large overlying clot, treated with hemostatic spray.  In the absence of perforation, general surgery signed off.  Slowly advancing diet, monitor H&H closely and transfuse as needed.    Assessment & Plan:   Principal Problem:   GI bleed Active Problems:   Acute duodenal ulcer with bleeding   ABLA (acute blood loss anemia)   Leukocytosis   Transient hypotension   History of AAA (abdominal aortic aneurysm) repair   Tobacco abuse   Chronic duodenal ulcer with bleeding   Melena   Upper GI bleed   Anxiety   Chronic obstructive pulmonary disease (HCC)   Coronary artery disease involving native heart without angina pectoris  #1 acute upper GI bleed secondary to large duodenal ulcer -Patient had presented with abdominal pain and bloody bowel movements. -CT angiogram abdomen and pelvis done showed a transmural ulceration along the posterior wall of the descending duodenum with surrounding soft tissue stranding.  No extraluminal gas identified to  suggest free perforation.  No signs of active GI bleed. -Patient seen in consultation by GI, underwent upper endoscopy for 05/16/2024 which showed a large, 30 mm in largest diameter, duodenal ulcer encompassing the entire distal duodenal bulb and sweep, with large overlying clot.  Treated with hemostatic spray. -Gastric biopsy negative for H. pylori. -Patient diet advanced to a full liquid diet, currently on IV PPI every 8 hours, serial CBCs and transfuse for hemoglobin <7. -Patient counseled extensively on avoiding NSAIDs including BC powder and Goody's powder. -Patient seen in consultation by general surgery who have signed off in the absence of perforation. -Bleeding seems to have subsided. -Patient being transition from IV PPI to oral PPI twice daily.  GI and diet being advanced to a soft diet. -Per GI.  2.  Acute blood loss anemia -Secondary to #1. -Hemoglobin noted at 16.4 on 02/12/2023 patient on presentation noted to have a hemoglobin of 7.3. -Status post transfusion 2 units PRBCs. -Hemoglobin currently stabilizing and improving currently at 9.9 this morning. -Follow H&H. -Transfusion threshold hemoglobin < 7.  3.  History of AAA -Status post aneurysmal repair and being followed by vascular surgeon in the outpatient setting. -CT imaging noted postsurgical changes from previous abdominal aortic aneurysm repair. -Infrarenal abdominal aortic aneurysm measuring 3.5 x 3.5 cm, previously 3.5 x 3.2 cm. 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 -Outpatient follow-up with vascular surgery.  4.  COPD/tobacco abuse -Tobacco cessation.  5.  CAD status post stents -Stable. -Patient not  on antiplatelets or medications prior to admission. -Outpatient follow-up.  6.  Hypertension/hypotension -Patient not on antihypertensive medications prior to admission. -BP noted to be soft which was  likely secondary to GI bleed. -BP stable.  7.  Anxiety disorder -Continue hydroxyzine as needed.   DVT prophylaxis: SCDs Code Status: Full Family Communication: Updated patient.  No family at bedside. Disposition: Home when clinically stable, hemoglobin stabilized and cleared by gastroenterology.  Status is: Inpatient Remains inpatient appropriate because: Severity of illness   Consultants:  General Surgery: Dr. Violeta Gelinas 02/02/2024 Gastroenterology: Dr.Danis III   Procedures:  CT angiogram abdomen and pelvis 02/02/2024 Upper endoscopy 02/02/2024: Per Dr. Myrtie Neither III  Transfusion 2 units PRBCs 02/02/2024  Antimicrobials:  Anti-infectives (From admission, onward)    Start     Dose/Rate Route Frequency Ordered Stop   02/02/24 1230  Ampicillin-Sulbactam (UNASYN) 3 g in sodium chloride 0.9 % 100 mL IVPB        3 g 200 mL/hr over 30 Minutes Intravenous  Once 02/02/24 1227 02/02/24 1840         Subjective: Patient laying in bed.  Denies any chest pain or shortness of breath.  States abdominal pain has improved.  Denies any melanotic or bright red blood per rectum.  Tolerating current diet.  Asking when he is going to be able to go home.  Objective: Vitals:   02/03/24 2306 02/04/24 0352 02/04/24 0728 02/04/24 1717  BP: 108/65 108/62 109/73 111/75  Pulse:   61 75  Resp: 13 17  16   Temp: 98 F (36.7 C) (!) 97.4 F (36.3 C) 98.1 F (36.7 C) 97.6 F (36.4 C)  TempSrc: Oral Oral Oral Oral  SpO2:   100%   Weight:      Height:       No intake or output data in the 24 hours ending 02/04/24 1814 Filed Weights   02/02/24 0620 02/02/24 1317  Weight: 79.4 kg 79.4 kg    Examination:  General exam: Appears calm and comfortable  Respiratory system: Clear to auscultation. Respiratory effort normal. Cardiovascular system: S1 & S2 heard, RRR. No JVD, murmurs, rubs, gallops or clicks. No pedal edema. Gastrointestinal system: Abdomen is nondistended, soft and nontender. No  organomegaly or masses felt. Normal bowel sounds heard. Central nervous system: Alert and oriented. No focal neurological deficits. Extremities: Symmetric 5 x 5 power. Skin: No rashes, lesions or ulcers Psychiatry: Judgement and insight appear normal. Mood & affect appropriate.     Data Reviewed: I have personally reviewed following labs and imaging studies  CBC: Recent Labs  Lab 02/02/24 0636 02/02/24 1748 02/03/24 0322 02/03/24 0919 02/03/24 1910 02/04/24 0310 02/04/24 0940  WBC 13.1*  --  9.9 9.4  --   --   --   NEUTROABS 10.6*  --   --   --   --   --   --   HGB 7.3*   < > 7.5* 7.6* 8.0* 8.4* 9.9*  HCT 22.2*   < > 21.7* 22.2* 22.9* 24.3* 29.0*  MCV 91.4  --  87.1 86.7  --   --   --   PLT 482*  --  328 359  --   --   --    < > = values in this interval not displayed.    Basic Metabolic Panel: Recent Labs  Lab 02/02/24 0636 02/03/24 0322  NA 137 138  K 3.7 3.5  CL 103 106  CO2 24 23  GLUCOSE 122* 94  BUN 20  9  CREATININE 0.66 0.57*  CALCIUM 8.5* 8.3*    GFR: Estimated Creatinine Clearance: 92.1 mL/min (A) (by C-G formula based on SCr of 0.57 mg/dL (L)).  Liver Function Tests: Recent Labs  Lab 02/02/24 0636  AST 30  ALT 15  ALKPHOS 55  BILITOT 0.3  PROT 5.5*  ALBUMIN 2.8*    CBG: No results for input(s): "GLUCAP" in the last 168 hours.   No results found for this or any previous visit (from the past 240 hours).       Radiology Studies: No results found.      Scheduled Meds:  nicotine  21 mg Transdermal Daily   [START ON 02/05/2024] pantoprazole  40 mg Oral BID AC   sodium chloride flush  3 mL Intravenous Q12H   Continuous Infusions:   LOS: 1 day    Time spent: 40 minutes    Ramiro Harvest, MD [facility]   To contact the attending provider between 7A-7P or the covering provider during after hours 7P-7A, please log into the web site [email or web address] and access using universal [place] password for that web site. If  you do not have the password, please call the hospital operator.  02/04/2024, 6:14 PM

## 2024-02-04 NOTE — Progress Notes (Signed)
 Mobility Specialist Progress Note:    02/04/24 1212  Mobility  Activity Ambulated with assistance in room;Ambulated with assistance to bathroom  Level of Assistance Contact guard assist, steadying assist  Assistive Device None  Distance Ambulated (ft) 20 ft  Activity Response Tolerated well  Mobility Referral Yes  Mobility visit 1 Mobility  Mobility Specialist Start Time (ACUTE ONLY) 1007  Mobility Specialist Stop Time (ACUTE ONLY) 1017  Mobility Specialist Time Calculation (min) (ACUTE ONLY) 10 min   Pt received in bed agreeable to mobility. Pt requested to use the BR prior to ambulating halls. Pt was unable to use the BR and c/o a stomach ache. Pt ambulate around the room a little bit more before requesting to go to the BR again. Decline further ambulation d/t stomach pain. Left in BR, instructed to use call light when finished. All needs met.   Thompson Grayer Mobility Specialist  Please contact vis Secure Chat or  Rehab Office [phone]

## 2024-02-04 NOTE — Significant Event (Signed)
 Rapid Response Event Note   Reason for Call :  10/10 chest pain  Initial Focused Assessment:  Pt lying in bed with eyes open. He is alert and oriented, in no visible distress. He is c/o 10/10 lower chest/upper abdomen nonradiating chest pain. He says he has had this type of pain before but says it went away on it's own. Lungs CTA. ABD soft. Skin warm and dry.    T-98, HR-65, BP-121/76, RR-15, SpO2--100% on RA  Interventions:  EKG-NSR, possible R ventricular hypertrophy, possible LAT infarct, age undeterminted.  NTG SL 0.4mg  x 2(pain 10>6>2) (SBP 120>106>91) CBC Trop Plan of Care:  EKG with no STE. Pain better with NTG administration. Await lab/xray results. Continue to monitor pt. Please call RRT if further assistance needed.   Event Summary:   MD Notified: Dr. Toniann Fail Call Time:2307 Arrival Time:2311 End UJWJ:[id number]  Terrilyn Saver, RN

## 2024-02-04 NOTE — Progress Notes (Addendum)
 Patient ID: Anthony Coffey, male   DOB: [birth year], 68 y.o.   MRN: [id number]    Progress Note   Subjective   Day # 3 CC; acute major GI bleed with hypotension  Twice daily PPI  EGD on 02/02/2024 showed a benign-appearing moderate stenosis in the duodenal bulb and then on the distal side of this there was a large ulcer encompassing the entire distal duodenal bulb and sweep, no evidence for active bleeding or visible vessel, ulcer or GI 3 cm in size with large overlying clot, treated with hemostatic spray.  Gastric biopsies negative for H. Pylori  Fortunately patient has been stable since postprocedure, he has continued to complain of some abdominal pain though says overall much better than on admission.  There was concern on CT imaging for inflammatory changes in the duodenum and could not rule out a contained perforation.  He was able to advance to soft diet today though says that he felt very full and somewhat uncomfortable after eating a solid meal. No further black stools, no vomiting  Hemoglobin today 9.9/hematocrit 29.0 stable       .   Objective   Vital signs in last 24 hours: Temp:  [97.4 F (36.3 C)-98.2 F (36.8 C)] 97.6 F (36.4 C) (04/09 1717) Pulse Rate:  [61-75] 75 (04/09 1717) Resp:  [13-17] 16 (04/09 1717) BP: (108-111)/(62-75) 111/75 (04/09 1717) SpO2:  [100 %] 100 % (04/09 0728)   General:    Older white male in NAD Heart:  Regular rate and rhythm; no murmurs Lungs: Respirations even and unlabored, lungs CTA bilaterally Abdomen:  Soft, mild tenderness in the hypogastrium, no rebound or guarding. Normal bowel sounds. Extremities:  Without edema. Neurologic:  Alert and oriented,  grossly normal neurologically. Psych:  Cooperative. Normal mood and affect.  Intake/Output from previous day: No intake/output data recorded. Intake/Output this shift: No intake/output data recorded.  Lab Results: Recent Labs    02/02/24 0636 02/02/24 1748 02/03/24 0322  02/03/24 0919 02/03/24 1910 02/04/24 0310 02/04/24 0940  WBC 13.1*  --  9.9 9.4  --   --   --   HGB 7.3*   < > 7.5* 7.6* 8.0* 8.4* 9.9*  HCT 22.2*   < > 21.7* 22.2* 22.9* 24.3* 29.0*  PLT 482*  --  328 359  --   --   --    < > = values in this interval not displayed.   BMET Recent Labs    02/02/24 0636 02/03/24 0322  NA 137 138  K 3.7 3.5  CL 103 106  CO2 24 23  GLUCOSE 122* 94  BUN 20 9  CREATININE 0.66 0.57*  CALCIUM 8.5* 8.3*   LFT Recent Labs    02/02/24 0636  PROT 5.5*  ALBUMIN 2.8*  AST 30  ALT 15  ALKPHOS 55  BILITOT 0.3   PT/INR Recent Labs    02/02/24 0636  LABPROT 14.5  INR 1.1        Assessment / Plan:    #47 68 year old white male admitted with an acute major upper GI bleed with hypotension in setting of chronic Goody powder use.  Found to have a very large 3 cm duodenal bulb ulceration with adherent clot on EGD, treated with hemostatic spray. Fortunately has not had any further active bleeding postprocedure He did present with abdominal pain as well which is improved since admission  Hemoglobin stable over the past 24 hours Gastric biopsies negative for H. pylori  Complaining of fullness and  discomfort after solid food likely secondary to the benign stenosis, and edema secondary to the very large ulcer. Discussed with the patient that this may take multiple weeks to improve and that the ulcer would likely take a couple of months to heal.  #2 prior abdominal aortic aneurysm repair #3.  Coronary artery disease status post prior MI with drug-eluting stents, was not on antiplatelet therapy on admission #4.  COPD  no oxygen #5 smoker   Plan; continue soft diet, advised patient that he may need to eat smaller amounts more frequently during the day to avoid abdominal discomfort, and aim for lower roughage meals.  Will convert to twice daily p.o. Protonix  If hemoglobin stable in a.m. he can be discharged home Discussed again today that it  is imperative that he stop using Goody powders, BC powders or any aspirin or NSAIDs, can use Tylenol as needed  We will arrange for outpatient follow-up with Dr. Myrtie Neither, and patient will be contacted with this appointment. Please discharge home on twice daily Protonix 40 mg and refill x 4 if able GI will sign off, available if needed    Principal Problem:   GI bleed Active Problems:   Tobacco abuse   Acute duodenal ulcer with bleeding   ABLA (acute blood loss anemia)   Transient hypotension   History of AAA (abdominal aortic aneurysm) repair   Chronic duodenal ulcer with bleeding   Melena   Leukocytosis   Upper GI bleed     LOS: 1 day   Amy Esterwood PA-C 02/04/2024, 5:38 PM  I have taken an interval history, thoroughly reviewed the chart and examined the patient. I agree with the Advanced Practitioner's note, impression and recommendations, and have recorded additional findings, impressions and recommendations below. I performed a substantive portion of this encounter (>50% time spent), including a complete performance of the medical decision making.  My additional thoughts are as follows:  Bleeding has stopped, he is tolerating regular food, biopsies negative for H. pylori, he is on IV PPI which I we will change to oral dosing. If he continues on current trajectory, home tomorrow and we will arrange outpatient follow-up. Absolutely, positively, and in all other ways, no aspirin or NSAID use.   [name] [phone]

## 2024-02-05 ENCOUNTER — Inpatient Hospital Stay (HOSPITAL_COMMUNITY)

## 2024-02-05 ENCOUNTER — Encounter (HOSPITAL_COMMUNITY): Payer: Self-pay | Admitting: Internal Medicine

## 2024-02-05 ENCOUNTER — Encounter: Payer: Self-pay | Admitting: Physician Assistant

## 2024-02-05 DIAGNOSIS — K921 Melena: Secondary | ICD-10-CM | POA: Diagnosis not present

## 2024-02-05 DIAGNOSIS — K260 Acute duodenal ulcer with hemorrhage: Secondary | ICD-10-CM | POA: Diagnosis not present

## 2024-02-05 DIAGNOSIS — D62 Acute posthemorrhagic anemia: Secondary | ICD-10-CM | POA: Diagnosis not present

## 2024-02-05 DIAGNOSIS — I214 Non-ST elevation (NSTEMI) myocardial infarction: Secondary | ICD-10-CM

## 2024-02-05 DIAGNOSIS — J42 Unspecified chronic bronchitis: Secondary | ICD-10-CM

## 2024-02-05 DIAGNOSIS — K264 Chronic or unspecified duodenal ulcer with hemorrhage: Secondary | ICD-10-CM | POA: Diagnosis not present

## 2024-02-05 LAB — BASIC METABOLIC PANEL WITH GFR
Anion gap: 11 (ref 5–15)
Anion gap: 12 (ref 5–15)
BUN: 5 mg/dL — ABNORMAL LOW (ref 8–23)
BUN: <5 mg/dL — ABNORMAL LOW (ref 8–23)
CO2: 19 mmol/L — ABNORMAL LOW (ref 22–32)
CO2: 23 mmol/L (ref 22–32)
Calcium: 8.1 mg/dL — ABNORMAL LOW (ref 8.9–10.3)
Calcium: 8.2 mg/dL — ABNORMAL LOW (ref 8.9–10.3)
Chloride: 104 mmol/L (ref 98–111)
Chloride: 106 mmol/L (ref 98–111)
Creatinine, Ser: 0.69 mg/dL (ref 0.61–1.24)
Creatinine, Ser: 0.71 mg/dL (ref 0.61–1.24)
GFR, Estimated: >60 mL/min (ref >60)
GFR, Estimated: >60 mL/min (ref >60)
Glucose, Bld: 105 mg/dL — ABNORMAL HIGH (ref 70–99)
Glucose, Bld: 111 mg/dL — ABNORMAL HIGH (ref 70–99)
Potassium: 2.9 mmol/L — ABNORMAL LOW (ref 3.5–5.1)
Potassium: 3.4 mmol/L — ABNORMAL LOW (ref 3.5–5.1)
Sodium: 135 mmol/L (ref 135–145)
Sodium: 140 mmol/L (ref 135–145)

## 2024-02-05 LAB — ECHOCARDIOGRAM COMPLETE
AR max vel: 2.97 cm2
AV Area VTI: 2.77 cm2
AV Area mean vel: 2.73 cm2
AV Mean grad: 4 mmHg
AV Peak grad: 10.5 mmHg
Ao pk vel: 1.62 m/s
Area-P 1/2: 2.31 cm2
Calc EF: 65.9 %
Height: 69 in
MV M vel: 5.6 m/s
MV Peak grad: 125.4 mmHg
S' Lateral: 3.4 cm
Single Plane A2C EF: 61.8 %
Single Plane A4C EF: 71.5 %
Weight: 2800.72 [oz_av]

## 2024-02-05 LAB — CBC WITH DIFFERENTIAL/PLATELET
Abs Immature Granulocytes: 0.04 10*3/uL (ref 0.00–0.07)
Basophils Absolute: 0.1 10*3/uL (ref 0.0–0.1)
Basophils Relative: 1 %
Eosinophils Absolute: 0.1 10*3/uL (ref 0.0–0.5)
Eosinophils Relative: 1 %
HCT: 26.9 % — ABNORMAL LOW (ref 39.0–52.0)
Hemoglobin: 9.2 g/dL — ABNORMAL LOW (ref 13.0–17.0)
Immature Granulocytes: 0 %
Lymphocytes Relative: 15 %
Lymphs Abs: 1.4 10*3/uL (ref 0.7–4.0)
MCH: 30.3 pg (ref 26.0–34.0)
MCHC: 34.2 g/dL (ref 30.0–36.0)
MCV: 88.5 fL (ref 80.0–100.0)
Monocytes Absolute: 0.5 10*3/uL (ref 0.1–1.0)
Monocytes Relative: 5 %
Neutro Abs: 7.3 10*3/uL (ref 1.7–7.7)
Neutrophils Relative %: 78 %
Platelets: 430 10*3/uL — ABNORMAL HIGH (ref 150–400)
RBC: 3.04 MIL/uL — ABNORMAL LOW (ref 4.22–5.81)
RDW: 13.8 % (ref 11.5–15.5)
WBC: 9.4 10*3/uL (ref 4.0–10.5)
nRBC: 0 % (ref 0.0–0.2)

## 2024-02-05 LAB — CBC
HCT: 23.6 % — ABNORMAL LOW (ref 39.0–52.0)
Hemoglobin: 8.1 g/dL — ABNORMAL LOW (ref 13.0–17.0)
MCH: 30.2 pg (ref 26.0–34.0)
MCHC: 34.3 g/dL (ref 30.0–36.0)
MCV: 88.1 fL (ref 80.0–100.0)
Platelets: 408 10*3/uL — ABNORMAL HIGH (ref 150–400)
RBC: 2.68 MIL/uL — ABNORMAL LOW (ref 4.22–5.81)
RDW: 13.8 % (ref 11.5–15.5)
WBC: 10.3 10*3/uL (ref 4.0–10.5)
nRBC: 0 % (ref 0.0–0.2)

## 2024-02-05 LAB — TROPONIN I (HIGH SENSITIVITY)
Troponin I (High Sensitivity): 2330 ng/L (ref <18)
Troponin I (High Sensitivity): 2508 ng/L (ref <18)
Troponin I (High Sensitivity): 2634 ng/L (ref <18)
Troponin I (High Sensitivity): 3235 ng/L (ref <18)

## 2024-02-05 LAB — HEMOGLOBIN AND HEMATOCRIT, BLOOD
HCT: 24.1 % — ABNORMAL LOW (ref 39.0–52.0)
Hemoglobin: 8.3 g/dL — ABNORMAL LOW (ref 13.0–17.0)

## 2024-02-05 LAB — PREPARE RBC (CROSSMATCH)

## 2024-02-05 LAB — MAGNESIUM: Magnesium: 1.9 mg/dL (ref 1.7–2.4)

## 2024-02-05 LAB — HEPARIN LEVEL (UNFRACTIONATED): Heparin Unfractionated: <0.1 [IU]/mL — ABNORMAL LOW (ref 0.30–0.70)

## 2024-02-05 MED ORDER — CEFAZOLIN SODIUM-DEXTROSE 2-4 GM/100ML-% IV SOLN
INTRAVENOUS | Status: AC
  Filled 2024-02-05: qty 100

## 2024-02-05 MED ORDER — PANTOPRAZOLE SODIUM 40 MG IV SOLR
40.0000 mg | Freq: Three times a day (TID) | INTRAVENOUS | Status: DC
  Administered 2024-02-05 – 2024-02-07 (×6): 40 mg via INTRAVENOUS
  Filled 2024-02-05 (×6): qty 10

## 2024-02-05 MED ORDER — BOOST / RESOURCE BREEZE PO LIQD CUSTOM
1.0000 | Freq: Three times a day (TID) | ORAL | Status: DC
  Administered 2024-02-06 (×2): 1 via ORAL

## 2024-02-05 MED ORDER — MORPHINE SULFATE (PF) 2 MG/ML IV SOLN
0.5000 mg | Freq: Once | INTRAVENOUS | Status: AC
  Administered 2024-02-05: 0.5 mg via INTRAVENOUS
  Filled 2024-02-05: qty 1

## 2024-02-05 MED ORDER — POTASSIUM CHLORIDE CRYS ER 20 MEQ PO TBCR
40.0000 meq | EXTENDED_RELEASE_TABLET | Freq: Once | ORAL | Status: AC
  Administered 2024-02-05: 40 meq via ORAL
  Filled 2024-02-05: qty 2

## 2024-02-05 MED ORDER — CEFAZOLIN SODIUM-DEXTROSE 2-4 GM/100ML-% IV SOLN
INTRAVENOUS | Status: AC | PRN
  Administered 2024-02-05: 2 g via INTRAVENOUS

## 2024-02-05 MED ORDER — MIDAZOLAM HCL 2 MG/2ML IJ SOLN
INTRAMUSCULAR | Status: AC | PRN
Start: 2024-02-05 — End: 2024-02-05
  Administered 2024-02-05 (×4): 1 mg via INTRAVENOUS

## 2024-02-05 MED ORDER — PANTOPRAZOLE SODIUM 40 MG IV SOLR
40.0000 mg | Freq: Two times a day (BID) | INTRAVENOUS | Status: DC
  Administered 2024-02-05: 40 mg via INTRAVENOUS
  Filled 2024-02-05: qty 10

## 2024-02-05 MED ORDER — LIDOCAINE HCL 1 % IJ SOLN
INTRAMUSCULAR | Status: AC
  Filled 2024-02-05: qty 20

## 2024-02-05 MED ORDER — HEPARIN (PORCINE) 25000 UT/250ML-% IV SOLN
900.0000 [IU]/h | INTRAVENOUS | Status: DC
  Administered 2024-02-05: 900 [IU]/h via INTRAVENOUS
  Filled 2024-02-05: qty 250

## 2024-02-05 MED ORDER — ATORVASTATIN CALCIUM 80 MG PO TABS
80.0000 mg | ORAL_TABLET | Freq: Every day | ORAL | Status: DC
  Administered 2024-02-06 – 2024-02-08 (×3): 80 mg via ORAL
  Filled 2024-02-05 (×3): qty 1

## 2024-02-05 MED ORDER — SODIUM CHLORIDE 0.9% IV SOLUTION: Freq: Once | INTRAVENOUS | Status: DC

## 2024-02-05 MED ORDER — IOHEXOL 300 MG/ML  SOLN
100.0000 mL | Freq: Once | INTRAMUSCULAR | Status: AC | PRN
  Administered 2024-02-05: 75 mL via INTRA_ARTERIAL

## 2024-02-05 MED ORDER — SUCRALFATE 1 GM/10ML PO SUSP
1.0000 g | Freq: Three times a day (TID) | ORAL | Status: DC
  Administered 2024-02-05 – 2024-02-08 (×13): 1 g via ORAL
  Filled 2024-02-05 (×13): qty 10

## 2024-02-05 MED ORDER — POTASSIUM CHLORIDE 10 MEQ/100ML IV SOLN
10.0000 meq | INTRAVENOUS | Status: DC
  Filled 2024-02-05: qty 100

## 2024-02-05 MED ORDER — MIDAZOLAM HCL 2 MG/2ML IJ SOLN
INTRAMUSCULAR | Status: AC
Start: 2024-02-05 — End: ?
  Filled 2024-02-05: qty 6

## 2024-02-05 MED ORDER — IOHEXOL 350 MG/ML SOLN
75.0000 mL | Freq: Once | INTRAVENOUS | Status: AC | PRN
  Administered 2024-02-05: 75 mL via INTRAVENOUS

## 2024-02-05 NOTE — Consult Note (Addendum)
 Cardiology Consultation   Patient ID: Anthony Coffey MRN: [id number]; DOB: 12/05/1955  Admit date: 02/02/2024 Date of Consult: 02/05/2024  PCP:  Aviva Kluver   [facility] Providers Cardiologist:  Lesleigh Noe, MD (Inactive)        Patient Profile:   Anthony Coffey is a 68 y.o. male with a hx of LCX STEMI s/p PCI to OM & LAD (2018), HTN, HLD, AAA s/p repair and COPD who is being seen 02/05/2024 for the evaluation of chest pain and elevated troponins at the request of Dr. Toniann Fail.  History of Present Illness:   Anthony Coffey was admitted on 4/7 for symptomatic anemia in the setting of an UGIB from a bleeding duodenal ulcer.  His hemoglobin was 7.3 on admission.  A CT GI protocol revealed transmural ulceration along the posterior wall of the descending duodenum.  He underwent EGD on 4/7 which revealed hematin in the gastric antrum with a large duodenal ulcer with adherent clot.  This ulceration was treated with hemostatic spray and the patient had been stable since.  At roughly midnight today, the patient developed sudden onset 10/10 chest pain.  He described the chest pain as being sharp and midsternal without radiation.  The patient was given Tylenol and 2 SLNGTs with initially mild improvement of his chest pain.  Chest pain completely resolved after approximately 30 minutes.  Of note the patient suffered a STEMI in 2018 for which she states that he had very severe chest pain.  He states that his current episode of chest pain is different from his prior episode of chest pain, but was still severe.  The primary team check troponins and they are markedly elevated at 2634 -> 3235.  ECG showed nonspecific ST segment changes, but was otherwise normal.  Serial ECGs were negative for dynamic changes.  The patient was chest pain-free at the time of my examination.  Cardiology is consulted for evaluation.   Past Medical History:  Diagnosis Date   Aneurysm (HCC)    Anxiety    CAD (coronary  artery disease)    02/01/17 Cath DES--> OM, staged PCI DES -->mLAD, EF 55%   COPD (chronic obstructive pulmonary disease) (HCC)    smoker   Hypertension    high with anxiety   Myocardial infarction [facility])     Past Surgical History:  Procedure Laterality Date   ABDOMINAL AORTIC ANEURYSM REPAIR     CORONARY STENT INTERVENTION N/A 02/01/2017   Procedure: Coronary Stent Intervention;  Surgeon: Lyn Records, MD;  Location: [facility] INVASIVE CV LAB;  Service: Cardiovascular;  Laterality: N/A;   CORONARY STENT INTERVENTION N/A 02/03/2017   Procedure: Coronary Stent Intervention;  Surgeon: Tonny Bollman, MD;  Location: [facility] INVASIVE CV LAB;  Service: Cardiovascular;  Laterality: N/A;   EAR CYST EXCISION Right 06/14/2013   Procedure: CYST REMOVAL RIGHT MANDIBLE;  Surgeon: Georgia Lopes, DDS;  Location: [facility] OR;  Service: Oral Surgery;  Laterality: Right;   ESOPHAGOGASTRODUODENOSCOPY N/A 02/02/2024   Procedure: EGD (ESOPHAGOGASTRODUODENOSCOPY);  Surgeon: Sherrilyn Rist, MD;  Location: [facility] ENDOSCOPY;  Service: Gastroenterology;  Laterality: N/A;   INTRAMEDULLARY (IM) NAIL INTERTROCHANTERIC Left 01/22/2020   Procedure: INTRAMEDULLARY (IM) NAIL INTERTROCHANTRIC;  Surgeon: Yolonda Kida, MD;  Location: [facility] OR;  Service: Orthopedics;  Laterality: Left;   LEFT HEART CATH AND CORONARY ANGIOGRAPHY N/A 02/01/2017   Procedure: Left Heart Cath and Coronary Angiography;  Surgeon: Lyn Records, MD;  Location: [facility] INVASIVE CV LAB;  Service: Cardiovascular;  Laterality: N/A;   TONSILLECTOMY     TOOTH EXTRACTION N/A 06/14/2013   Procedure: EXTRACTIONS 17, 23, 26, 27, 29, 32;  Surgeon: Georgia Lopes, DDS;  Location: [facility] OR;  Service: Oral Surgery;  Laterality: N/A;   VASCULAR SURGERY       Home Medications:  Prior to Admission medications   Medication Sig Start Date End Date Taking? Authorizing Provider  acetaminophen (TYLENOL) 500 MG tablet Take 1 tablet (500 mg total) by mouth every 6 (six) hours as needed for headache  (pain). 02/04/17  Yes Arty Baumgartner, NP    Inpatient Medications: Scheduled Meds:  sodium chloride   Intravenous Once   nicotine  21 mg Transdermal Daily   pantoprazole  40 mg Oral BID AC   sodium chloride flush  3 mL Intravenous Q12H   Continuous Infusions:  potassium chloride     PRN Meds: acetaminophen **OR** acetaminophen, albuterol, hydrOXYzine, nitroGLYCERIN  Allergies:   No Known Allergies  Social History:   Social History   Socioeconomic History   Marital status: Single    Spouse name: Not on file   Number of children: Not on file   Years of education: Not on file   Highest education level: Not on file  Occupational History   Not on file  Tobacco Use   Smoking status: Every Day    Current packs/day: 1.00    Average packs/day: 1 pack/day for 39.0 years (39.0 ttl pk-yrs)    Types: Cigarettes   Smokeless tobacco: Former  Substance and Sexual Activity   Alcohol use: No   Drug use: No   Sexual activity: Never  Other Topics Concern   Not on file  Social History Narrative   ** Merged History Encounter **       Social Drivers of Health   Financial Resource Strain: Not on file  Food Insecurity: Food Insecurity Present (02/02/2024)   Hunger Vital Sign    Worried About Running Out of Food in the Last Year: Sometimes true    Ran Out of Food in the Last Year: Sometimes true  Transportation Needs: No Transportation Needs (02/02/2024)   PRAPARE - Administrator, Civil Service (Medical): No    Lack of Transportation (Non-Medical): No  Physical Activity: Not on file  Stress: Not on file  Social Connections: Socially Isolated (02/02/2024)   Social Connection and Isolation Panel [NHANES]    Frequency of Communication with Friends and Family: Once a week    Frequency of Social Gatherings with Friends and Family: More than three times a week    Attends Religious Services: Never    Database administrator or Organizations: No    Attends Banker  Meetings: Never    Marital Status: Divorced  Catering manager Violence: Not At Risk (02/02/2024)   Humiliation, Afraid, Rape, and Kick questionnaire    Fear of Current or Ex-Partner: No    Emotionally Abused: No    Physically Abused: No    Sexually Abused: No    Family History:   Family History  Problem Relation Age of Onset   Anxiety disorder Brother      ROS:  Please see the history of present illness.  All other ROS reviewed and negative.     Physical Exam/Data:   Vitals:   02/04/24 2315 02/04/24 2340 02/04/24 2345 02/04/24 2354  BP: 121/73 (!) 106/54 (!) 106/56 (!) 91/56  Pulse:      Resp: 15 19  17   Temp:  98 F (36.7 C)     TempSrc: Oral     SpO2:      Weight:      Height:        Intake/Output Summary (Last 24 hours) at 02/05/2024 0210 Last data filed at 02/05/2024 0110 Gross per 24 hour  Intake 0 ml  Output --  Net 0 ml      02/02/2024    1:17 PM 02/02/2024    6:20 AM 02/12/2023    8:54 AM  Last 3 Weights  Weight (lbs) 175 lb 0.7 oz 175 lb 180 lb  Weight (kg) 79.4 kg 79.379 kg 81.647 kg     Body mass index is 25.85 kg/m.  General: Thin and pale gentleman in NAD HEENT: Atraumatic, normocephalic Neck: no JVD Vascular: Radial pulses 2+ bilaterally Cardiac:  normal S1, S2; RRR; no murmur, rubs or gallops Lungs:  clear to auscultation bilaterally, no wheezing, rhonchi or rales  Abd: soft, nontender, nondistended, normal BS Ext: no edema Musculoskeletal:  No deformities, BUE and BLE strength normal and equal Skin: warm and dry  Neuro:  CNs 2-12 intact, no focal abnormalities noted Psych:  Normal affect   EKG:  The EKG was personally reviewed and demonstrates: NSR with nonspecific ST changes in V2-V3  1st   2nd   Telemetry:  Telemetry was personally reviewed and demonstrates: NSR  Relevant CV Studies:  TTE 02/03/17:  Study Conclusions   - Left ventricle: The cavity size was normal. There was mild    concentric hypertrophy. Systolic function was  normal. The    estimated ejection fraction was in the range of 50% to 55%. Wall    motion was normal; there were no regional wall motion    abnormalities. Doppler parameters are consistent with abnormal    left ventricular relaxation (grade 1 diastolic dysfunction).    There was no evidence of elevated ventricular filling pressure by    Doppler parameters.  - Aortic valve: There was no regurgitation.  - Aortic root: The aortic root was normal in size.  - Mitral valve: Structurally normal valve. There was mild    regurgitation.  - Right ventricle: Systolic function was normal.  - Tricuspid valve: There was trivial regurgitation.  - Pulmonary arteries: Systolic pressure was within the normal    range.  - Inferior vena cava: The vessel was normal in size.  - Pericardium, extracardiac: There was no pericardial effusion.    LHC 02/01/17:  Acute inferolateral infarction due to occlusion of the second obtuse marginal which is a large vessel that runs along the inferolateral wall. 100% second obtuse marginal reduced to 0% with TIMI grade 3 flow after angioplasty and stenting using a 2.5 x 16 mm Synergy DES. 90% mid LAD stenosis Widely patent left main and small nondominant RCA. Normal LV function with EDP 23 mmHg. LVEF 55%  Laboratory Data:  High Sensitivity Troponin:   Recent Labs  Lab 02/04/24 2333  TROPONINIHS 2,634*     Chemistry Recent Labs  Lab 02/02/24 0636 02/03/24 0322 02/04/24 2333  NA 137 138 135  K 3.7 3.5 2.9*  CL 103 106 104  CO2 24 23 19*  GLUCOSE 122* 94 111*  BUN 20 9 5*  CREATININE 0.66 0.57* 0.69  CALCIUM 8.5* 8.3* 8.1*  GFRNONAA >60 >60 >60  ANIONGAP 10 9 12     Recent Labs  Lab 02/02/24 0636  PROT 5.5*  ALBUMIN 2.8*  AST 30  ALT 15  ALKPHOS 55  BILITOT 0.3  Lipids No results for input(s): "CHOL", "TRIG", "HDL", "LABVLDL", "LDLCALC", "CHOLHDL" in the last 168 hours.  Hematology Recent Labs  Lab 02/03/24 0322 02/03/24 0919 02/03/24 1910  02/04/24 0940 02/04/24 1832 02/04/24 2333  WBC 9.9 9.4  --   --   --  10.3  RBC 2.49* 2.56*  --   --   --  2.68*  HGB 7.5* 7.6*   < > 9.9* 8.1* 8.1*  HCT 21.7* 22.2*   < > 29.0* 23.6* 23.6*  MCV 87.1 86.7  --   --   --  88.1  MCH 30.1 29.7  --   --   --  30.2  MCHC 34.6 34.2  --   --   --  34.3  RDW 14.1 14.2  --   --   --  13.8  PLT 328 359  --   --   --  408*   < > = values in this interval not displayed.   Thyroid No results for input(s): "TSH", "FREET4" in the last 168 hours.  BNPNo results for input(s): "BNP", "PROBNP" in the last 168 hours.  DDimer No results for input(s): "DDIMER" in the last 168 hours.   Radiology/Studies:  CT Angio Abd/Pel W and/or Wo Contrast Result Date: 02/02/2024 CLINICAL DATA:  Evaluate for lower GI bleed. EXAM: CTA ABDOMEN AND PELVIS WITHOUT AND WITH CONTRAST TECHNIQUE: Multidetector CT imaging of the abdomen and pelvis was performed using the standard protocol during bolus administration of intravenous contrast. Multiplanar reconstructed images and MIPs were obtained and reviewed to evaluate the vascular anatomy. RADIATION DOSE REDUCTION: This exam was performed according to the departmental dose-optimization program which includes automated exposure control, adjustment of the mA and/or kV according to patient size and/or use of iterative reconstruction technique. CONTRAST:  80mL OMNIPAQUE IOHEXOL 350 MG/ML SOLN COMPARISON:  None 02/12/2023 FINDINGS: VASCULAR Aorta: Extensive aortic atherosclerosis. Postsurgical changes from previous abdominal aortic aneurysm repair. The infrarenal abdominal aorta measures 3.5 by 3.5 cm, image 72/2. Previously 3.5 x 3.2 cm. Celiac: Patent without evidence of aneurysm, dissection, vasculitis or significant stenosis. SMA: Patent without evidence of aneurysm, dissection, vasculitis or significant stenosis. Renals: Both renal arteries are patent without evidence of aneurysm, dissection, vasculitis, fibromuscular dysplasia or  significant stenosis. IMA: Not visualized. Inflow: Patent. Aneurysmal dilatation of the left common iliac artery measures 2.8 cm, image 97/12. Previously 2.7 cm. Right common iliac artery measures 2.6 cm, image 97/12. Previously 2.4 cm. Proximal Outflow: Bilateral common femoral and visualized portions of the superficial and profunda femoral arteries are patent without evidence of aneurysm, dissection, vasculitis or significant stenosis. Veins: No obvious venous abnormality within the limitations of this arterial phase study. Review of the MIP images confirms the above findings. NON-VASCULAR Lower chest: No acute abnormality. Hepatobiliary: No focal liver abnormality is seen. No gallstones, gallbladder wall thickening, or biliary dilatation. Pancreas: Unremarkable. No pancreatic ductal dilatation or surrounding inflammatory changes. Spleen: Normal in size without focal abnormality. Adrenals/Urinary Tract: Normal adrenal glands. No kidney stones or signs of obstructive uropathy. Bilateral renal calcifications are likely vascular nature. Bilateral Bosniak class 1 kidney cysts are noted. The largest is in the left kidney measuring 2 cm. No follow-up imaging recommended. Urinary bladder is within normal limits. Stomach/Bowel: Stomach appears normal. Transmural ulceration along the posterior wall of the descending duodenum with surrounding soft tissue stranding, image 66/22 and image 55/12. Ulcer crater measures approximately 2.9 cm. No extraluminal gas identified to suggest free perforation. No signs abnormal intraluminal contrast extravasation to suggest active  GI bleeding. No pathologic dilatation of the large or small bowel loops. Lymphatic: No signs of abdominopelvic adenopathy. Reproductive: Mild prostate gland enlargement with nodular thickening indenting the bladder base. Other: No significant free fluid or fluid collections. No signs of pneumoperitoneum. Musculoskeletal: Previous ORIF of the left femur. No acute  or suspicious osseous findings. First degree anterolisthesis of L3 on L4. Degenerative disc disease noted at L3-4, L4-5 and L5-S1. IMPRESSION: 1. Transmural ulceration along the posterior wall of the descending duodenum with surrounding soft tissue stranding. No extraluminal gas identified to suggest free perforation. Cannot exclude contained duodenal ulcer perforation. 2. No signs of active GI bleeding. 3. Postsurgical changes from previous abdominal aortic aneurysm repair. Infrarenal abdominal aortic aneurysm measures 3.5 x 3.5 cm. Previously 3.5 x 3.2 cm. 4. 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 Previously 2.7 cm and 2.4 cm respectively. 5.  Aortic Atherosclerosis (ICD10-I70.0). Electronically Signed   By: Signa Kell M.D.   On: 02/02/2024 10:12     Assessment and Plan:   Anthony Coffey is a 68 y.o. male with a hx of LCX STEMI s/p PCI to OM & LAD (2018), HTN, HLD, AAA s/p repair and COPD who is being seen 02/05/2024 for the evaluation of chest pain and elevated troponins at the request of Dr. Toniann Fail.  #NSTEMI Type 1 vs Type 2 #Known Obstructive CAD #HLD #High Bleeding Risk :: Patient admitted with asymptomatic UGIB recently treated with hemostatic spray.  Now has developed acute chest pain with associated chronically elevated troponins.  Given his symptoms, troponins, and risk factors for CAD, I am left with no choice but to treat the symptoms and ACS event.  It is unclear to me at this time if this is more likely type I versus type II given the recent stress on his cardiovascular system due to his GI bleeding.  Under ideal circumstances we will treat ACS with antiplatelets, blood thinners, and pursue heart catheterization.  I discussed this very challenging case with the on-call gastroenterologist Dr. Barron Alvine to better understand the patient's bleeding risk.  Per my conversation with GI, the patient has a large  duodenal ulcer which is subject to bleed again.  But we are left with no choice but to treat the patient for ACS given his clinical picture.  Our plan is to start a heparin drip targeting the lower end of therapeutic levels per pharmacy and without a bolus.  Antiplatelet therapy is unfortunately not a safe option at this time.  I will order a complete echo for the morning and the presence or absence of WMAs can help determine his treatment course.  LAC is off the table given that he cannot safely be aggressively anticoagulated nor received antiplatelets at this time.  I will also transfuse him a unit of blood and start Carafate per GI recommendations.  He will need close monitoring for signs of bleeding while on heparin.  I will not start a beta-blocker at this time because I do not want to blunt a tachycardic response which may be the first clue that he is having rebleeding. - Start heparin drip targeting the lower end of therapeutic range for ACS and without bolus - Order complete echo - Antiplatelet therapy contraindicated given bleeding risk - Sublingual nitroglycerin as needed for recurrent chest pain - Maintain telemetry - Start atorvastatin 80 mg daily - Monitor closely for evidence of bleeding - Start Carafate 1 g QID -  Continue IV PPI - Trend H/H at least q6h for now   Risk Assessment/Risk Scores:     TIMI Risk Score for Unstable Angina or Non-ST Elevation MI:   The patient's TIMI risk score is 6, which indicates a 41% risk of all cause mortality, new or recurrent myocardial infarction or need for urgent revascularization in the next 14 days.          For questions or updates, please contact [facility] Please consult [email or web address] for contact info under    Signed, Karl Ito, MD  02/05/2024 2:10 AM

## 2024-02-05 NOTE — Significant Event (Addendum)
 Patient's nurse notified me that patient was complaining of retrosternal chest pain.  Stat EKG was done which shows normal sinus rhythm with nonspecific ST-T changes.  Patient admitted for GI bleed underwent EGD showed duodenal ulcer with blood clot.  Hemostatic spray was applied at the time.  Stat troponin done was 2600.  Patient's chest pain improved with sublingual nitroglycerin but still persisted.  I did consult on-call cardiologist Dr. Lendell Caprice who will be seeing patient in consult requested transfusing 1 unit of PRBC.  Addendum -at around 6:10 AM 02/05/24 patient complaining of worsening periumbilical pain.  EKG shows normal sinus rhythm.  Patient receiving PRBC transfusion and heparin.  Ordered CT angiogram of abdomen stat.  Midge Minium.

## 2024-02-05 NOTE — Plan of Care (Signed)
  Problem: Education: Goal: Knowledge of General Education information will improve Description: Including pain rating scale, medication(s)/side effects and non-pharmacologic comfort measures Outcome: Not Progressing   Problem: Health Behavior/Discharge Planning: Goal: Ability to manage health-related needs will improve Outcome: Not Progressing   Problem: Clinical Measurements: Goal: Ability to maintain clinical measurements within normal limits will improve Outcome: Not Progressing Goal: Will remain free from infection Outcome: Not Progressing Goal: Diagnostic test results will improve Outcome: Not Progressing Goal: Respiratory complications will improve Outcome: Not Progressing Goal: Cardiovascular complication will be avoided Outcome: Not Progressing   Problem: Activity: Goal: Risk for activity intolerance will decrease Outcome: Not Progressing   Problem: Nutrition: Goal: Adequate nutrition will be maintained Outcome: Not Progressing   Problem: Coping: Goal: Level of anxiety will decrease Outcome: Not Progressing   Problem: Elimination: Goal: Will not experience complications related to bowel motility Outcome: Not Progressing Goal: Will not experience complications related to urinary retention Outcome: Not Progressing   Problem: Pain Managment: Goal: General experience of comfort will improve and/or be controlled Outcome: Not Progressing   Problem: Safety: Goal: Ability to remain free from injury will improve Outcome: Not Progressing   Problem: Skin Integrity: Goal: Risk for impaired skin integrity will decrease Outcome: Not Progressing   Problem: Education: Goal: Understanding of CV disease, CV risk reduction, and recovery process will improve Outcome: Not Progressing Goal: Individualized Educational Video(s) Outcome: Not Progressing   Problem: Activity: Goal: Ability to return to baseline activity level will improve Outcome: Not Progressing   Problem:  Cardiovascular: Goal: Ability to achieve and maintain adequate cardiovascular perfusion will improve Outcome: Not Progressing Goal: Vascular access site(s) Level 0-1 will be maintained Outcome: Not Progressing   Problem: Health Behavior/Discharge Planning: Goal: Ability to safely manage health-related needs after discharge will improve Outcome: Not Progressing

## 2024-02-05 NOTE — Progress Notes (Addendum)
 Patient ID: Anthony Coffey, male   DOB: 06-Feb-1956, 68 y.o.   MRN: [id number]    Progress Note   Subjective   Day # 4 CC; acute major GI bleed with hypotension found secondary to very large/3 cm deep duodenal bulb ulcer  Patient had been doing well yesterday, had been able to advance diet and had not had any further evidence of active bleeding. Unfortunately last night around midnight he developed chest pain in his lower chest. Workup is consistent with an acute coronary syndrome/NSTEMI. He was evaluated by cardiology last night, markedly elevated troponins. Situation was discussed with our MD on-call/Dr. Barron Alvine regarding anticoagulation realizing patient at high risk for rebleeding from his ulcer.. Decision was made to start IV heparin  Patient did have a bloody bowel movement earlier this morning, had another bloody stool which he says was small around 8:15 AM. He denies any chest pain currently but is complaining of pain in his upper abdomen/substernal area.  No nausea or vomiting.  No complaint of shortness of breath Vital signs stable this morning  Patient was feeling urgency for another bowel movement just as I was leaving the room, there was old blood in the commode from his last bowel movement which was clearly dark red  CTA was done this morning at 6:30 AM noting the duodenal ulcer measuring 2.8 x 2.6 cm, there was surrounding soft tissue stranding as noted previously, and high density material seen in the pylorus and descending duodenum but no definite signs of active extravasation, stable appearance of the infrarenal abdominal aortic aneurysm  Troponins 2634> 3235 Labs this a.m. 8:30 AM WBC 9.4/hemoglobin 9.2/hematocrit 26.9-he did receive 1 unit of packed RBCs last night BMET  Pend   Heparin off 8:45 AM Back on IV PPI twice daily    Objective   Vital signs in last 24 hours: Temp:  [97.6 F (36.4 C)-98.5 F (36.9 C)] 98.2 F (36.8 C) (04/10 0823) Pulse Rate:   [63-90] 90 (04/10 0823) Resp:  [15-20] 20 (04/10 0823) BP: (91-136)/(54-78) 109/76 (04/10 0823) SpO2:  [100 %] 100 % (04/10 0358)   General:    Pale older white male in NAD Heart:  irRegular rate and rhythm; no murmurs Lungs: Respirations even and unlabored, lungs CTA bilaterally Abdomen: Soft, bowel sounds are present he is tender in the epigastrium, no rebound, no mass or hepatosplenomegaly Extremities:  Without edema. Neurologic:  Alert and oriented,  grossly normal neurologically. Psych:  Cooperative. Normal mood and affect.  Intake/Output from previous day: No intake/output data recorded. Intake/Output this shift: No intake/output data recorded.  Lab Results: Recent Labs    02/03/24 0919 02/03/24 1910 02/04/24 1832 02/04/24 2333 02/05/24 0836  WBC 9.4  --   --  10.3 9.4  HGB 7.6*   < > 8.1* 8.1* 9.2*  HCT 22.2*   < > 23.6* 23.6* 26.9*  PLT 359  --   --  408* 430*   < > = values in this interval not displayed.   BMET Recent Labs    02/03/24 0322 02/04/24 2333  NA 138 135  K 3.5 2.9*  CL 106 104  CO2 23 19*  GLUCOSE 94 111*  BUN 9 5*  CREATININE 0.57* 0.69  CALCIUM 8.3* 8.1*   LFT No results for input(s): "PROT", "ALBUMIN", "AST", "ALT", "ALKPHOS", "BILITOT", "BILIDIR", "IBILI" in the last 72 hours. PT/INR No results for input(s): "LABPROT", "INR" in the last 72 hours.  Studies/Results: CT ANGIO GI BLEED Result Date: 02/05/2024 CLINICAL DATA:  Rectal bleeding. Known duodenal ulcer. Acute mesenteric ischemia. EXAM: CTA ABDOMEN AND PELVIS WITHOUT AND WITH CONTRAST TECHNIQUE: Multidetector CT imaging of the abdomen and pelvis was performed using the standard protocol during bolus administration of intravenous contrast. Multiplanar reconstructed images and MIPs were obtained and reviewed to evaluate the vascular anatomy. RADIATION DOSE REDUCTION: This exam was performed according to the departmental dose-optimization program which includes automated exposure  control, adjustment of the mA and/or kV according to patient size and/or use of iterative reconstruction technique. CONTRAST:  75mL OMNIPAQUE IOHEXOL 350 MG/ML SOLN COMPARISON:  02/02/2024 FINDINGS: VASCULAR Aorta: Extensive aortic atherosclerosis. Again seen are postsurgical changes from infrarenal abdominal aortic aneurysm repair. Maximum caliber of the abdominal aorta is 3.5 cm, image 119/10. Unchanged from previous exam. No signs of acute dissection. No periaortic fluid or fat stranding. Celiac: Patent without evidence of aneurysm, dissection, vasculitis or significant stenosis. SMA: Patent without evidence of aneurysm, dissection, vasculitis or significant stenosis. Renals: Both renal arteries are patent without evidence of aneurysm, dissection, vasculitis, fibromuscular dysplasia or significant stenosis. IMA: Not visualized status post aortic repair. 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 On the right this measures 2.6 cm and on the left this measures 2.9 cm. Proximal Outflow: Bilateral common femoral and visualized portions of the superficial and profunda femoral arteries are patent without evidence of aneurysm, dissection, vasculitis or significant stenosis. Veins: No obvious venous abnormality within the limitations of this arterial phase study. Review of the MIP images confirms the above findings. NON-VASCULAR Lower chest: No acute abnormality. Hepatobiliary: No focal liver abnormality is seen. No gallstones, gallbladder wall thickening, or biliary dilatation. Pancreas: Unremarkable. No pancreatic ductal dilatation or surrounding inflammatory changes. Spleen: Normal in size without focal abnormality. Adrenals/Urinary Tract: Normal adrenal glands. No kidney mass or obstructive uropathy. 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 Urinary bladder appears normal. Stomach/Bowel: Stomach appears normal. Known descending duodenal ulceration  is again noted. This measures 2.8 x 2.6 cm, image 85/10. There is surrounding soft tissue stranding as noted previously. On the precontrast images there is high density material identified within the pylorus and descending duodenum. This appears unchanged between the pre and postcontrast imaging without signs of active extravasation of IV contrast material within the lumen of the duodenum. Several foci of high attenuation material within the small bowel loops also noted which remains unchanged between the pre and postcontrast images. No progressive areas of intraluminal contrast material identified to suggest active GI bleeding. No pathologic dilatation of the large or small bowel loops. No pneumatosis identified. No signs of bowel ischemia. Lymphatic: No signs of abdominopelvic adenopathy. Reproductive: Prostate is unremarkable. Other: No significant free fluid or fluid collections. No pneumoperitoneum. Musculoskeletal: Previous ORIF of the left proximal femur. Lumbar degenerative disc disease. No acute or suspicious osseous findings. IMPRESSION: 1. No signs of active GI bleeding. 2. Known descending duodenal ulcer with surrounding inflammatory changes. This measures 2.8 x 2.6 cm. 3. No signs of bowel obstruction or ischemia. 4. Stable appearance of infrarenal abdominal aortic aneurysm repair. Maximum caliber of the abdominal aorta is 3.5 cm. 5. 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 Electronically Signed   By: Signa Kell M.D.   On: 02/05/2024 07:07   DG ABD ACUTE 2+V W 1V CHEST Result Date: 02/05/2024 CLINICAL DATA:  Abdomen pain EXAM: DG ABDOMEN ACUTE WITH 1 VIEW CHEST COMPARISON:  02/24/2022 FINDINGS: Single-view chest demonstrates no acute airspace disease or effusion. Normal cardiac size. No pneumothorax.  Supine and upright views of the abdomen demonstrate a nonobstructed gas pattern. No free air beneath the diaphragm. IMPRESSION: No active disease. Nonobstructed gas  pattern. Electronically Signed   By: Jasmine Pang M.D.   On: 02/05/2024 02:52       Assessment / Plan:    #48 68 year old white male admitted 4 days ago with acute major upper GI bleed with hypotension found secondary to a deep cratered 3 cm duodenal bulb ulcer with overlying clot on EGD treated with Hemospray.  Initial CT imaging showed a lot of inflammatory stranding around this ulcer.  Patient had not had any further bleeding postprocedure, and initial plan was for potential discharge for today.  Unfortunately he developed chest pain last night and workup is consistent with an acute coronary syndrome/NSTEMI. He has been evaluated by cardiology, and after discussing with GI on call decision was made to cover him with IV heparin  He started having bleeding early this morning dark bloody bowel movement x 2, having a third bowel movement currently  CTA was done about 6:30 AM and the ulcer was documented with high density material in the duodenum consistent with blood but no active extravasation noted  Certainly his bleeding from his large ulcer  Heparin has been stopped as of 8:45 AM  Last hemoglobin 1:09 unit last night  Plan; bedrest/bedside commode with help only Keep patient n.p.o. Repeat hemoglobin now then every 6 hours, transfuse to keep his hemoglobin at least 8 given he just had an acute MI IV PPI every 8 hours IR consult for urgent consultation, consider IR embolization-we can consider repeat EGD but that would primarily be for temporization with hemostatic spray/gel DC heparin Have asked that surgery to see him again as well.     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 I agree with the Advanced Practitioner's note, impression and recommendations, and have recorded additional findings, impressions and recommendations below. I performed a substantive portion of this encounter (>50% time spent), including a complete performance of the medical decision making.  My additional thoughts are as follows:  Patient when this afternoon for angiography and embolization of the GDA with good results. Stable at this point.  Cannot be on aspirin or anticoagulation at the present time despite non-STEMI, too soon to tell when that might be feasible but probably not for at least weeks to come. Clear liquid diet today plus some liquid protein calorie supplements we will order. CBC tomorrow Pantoprazole 40 mg IV 3 times daily as ordered  [name] Office:[phone]

## 2024-02-05 NOTE — Progress Notes (Signed)
 Patient back to 4E from IR. VSS. Patient educated on importance of bedrest. Patient verbalized understanding.  Kenard Gower, RN

## 2024-02-05 NOTE — Procedures (Signed)
 Interventional Radiology Procedure Note  Procedure: Celiac and GDA arteriography; embolization of GDA  Complications: None  Estimated Blood Loss: < 10 mL  Findings: Gastroduodenal artery embolized/occluded with MVP-5 vascular plug. Complete occlusion after deployment.  Jodi Marble. Fredia Sorrow, M.D Pager:  [phone]

## 2024-02-05 NOTE — Progress Notes (Addendum)
 PHARMACY - ANTICOAGULATION CONSULT NOTE  Pharmacy Consult for heparin Indication: chest pain/ACS  No Known Allergies  Patient Measurements: Height: 5\' 9"  (175.3 cm) Weight: 79.4 kg (175 lb 0.7 oz) IBW/kg (Calculated) : 70.7 HEPARIN DW (KG): 79.4  Vital Signs: Temp: 98 F (36.7 C) (04/09 2315) Temp Source: Oral (04/09 2315) BP: 91/56 (04/09 2354) Pulse Rate: 75 (04/09 1717)  Labs: Recent Labs    02/02/24 0636 02/02/24 1748 02/03/24 0322 02/03/24 0919 02/03/24 1910 02/04/24 0940 02/04/24 1832 02/04/24 2333 02/05/24 0107  HGB 7.3*   < > 7.5* 7.6*   < > 9.9* 8.1* 8.1*  --   HCT 22.2*   < > 21.7* 22.2*   < > 29.0* 23.6* 23.6*  --   PLT 482*  --  328 359  --   --   --  408*  --   LABPROT 14.5  --   --   --   --   --   --   --   --   INR 1.1  --   --   --   --   --   --   --   --   CREATININE 0.66  --  0.57*  --   --   --   --  0.69  --   TROPONINIHS  --   --   --   --   --   --   --  2,634* 3,235*   < > = values in this interval not displayed.    Estimated Creatinine Clearance: 92.1 mL/min (by C-G formula based on SCr of 0.69 mg/dL).   Medical History: Past Medical History:  Diagnosis Date   Aneurysm (HCC)    Anxiety    CAD (coronary artery disease)    02/01/17 Cath DES--> OM, staged PCI DES -->mLAD, EF 55%   COPD (chronic obstructive pulmonary disease) (HCC)    smoker   Hypertension    high with anxiety   Myocardial infarction [facility])    Assessment: 46 yoM presented to [facility] with GI bleed secondary to large duodenal ulcer (no active bleeding on EGD). Pharmacy consulted to dose heparin for ACS given chest pain.  -Hgb 8.1 (transfusing 1u PRBC), plts 408 -Troponin 2600 -4/7 EGD: non-bleeding duodenal ulcer with adherent clot (hemostatic spray applied) -4/7 Cta: extensive aortic atherosclerosis, postsurgical changes from previous AAA repair   Goal of Therapy:  Heparin level 0.3-0.5 units/ml given bleed risk Monitor platelets by anticoagulation protocol: Yes    Plan:  -No bolus given bleed risk -Start heparin infusion at 900 units/hr -6h heparin level -CBC, heparin level daily -Monitor for signs/symptoms of bleeding   Arabella Merles, PharmD. Clinical Pharmacist 02/05/2024 2:58 AM

## 2024-02-05 NOTE — Progress Notes (Addendum)
 Interval coverage note  See complete cardiology consult note from this morning.  Patient is a 68 year old male with past medical history of CAD s/p PCI to OM and LAD in 2018, hypertension, hyperlipidemia, AAA s/p repair and COPD.  He was admitted 3 days ago with symptomatic anemia in the setting of upper GI bleed from bleeding duodenal ulcer.  Hemoglobin was 7.3 on arrival.  CT GI protocol revealed a transmural ulceration along the posterior wall of the descending duodenum.  Patient underwent a EGD on the same day which showed a large duodenal ulcer with adherent clot.  This ulceration was treated with hemostatic spray.  Last night, patient developed sudden onset of chest pain and was given 2 nitroglycerin.  His chest pain is different from the previous angina.  Serial troponin 2634 --> 3235--> 2508 --> 2330.  ECG showed nonspecific changes.  Given high risk of bleeding, patient was felt not to be a great candidate for intervention which may require Plavix afterward.  Therefore, medical therapy was initially recommended.  Echocardiogram was ordered.  Patient was placed on IV heparin.  Unfortunately, he developed recurrent GI bleed on IV heparin.  IR was consulted and eventually took the patient today for arteriography and embolization of gastroduodenal ulcer.  General surgery was also consulted who has been following the patient on the side.  Subjective: Denies any chest pain or shortness of breath  Objective: General: Alert and orient x 3 HEENT: Normal Cardiovascular: Regular rate, no significant heart murmur Abdomen: Abdomen soft, nontender. Psych: Normal  Assessment and plan:  Elevated troponin: Initially seen by overnight fellow early this morning due to chest pain, subsequent serial troponin was up to 2000-3000 range.  Unclear if type I versus type II NSTEMI, however most likely demand ischemia in the setting of GI bleed and anemia.  Patient was recently treated with hemostatic spray during GI  procedure on 7/4, however after being placed on IV heparin, he had recurrent GI bleed.  He was taken by IR today for arteriography and embolization of gastroduodenal ulcer.  He has been chest pain-free.  He is currently not a candidate for any invasive study due to high risk of recurrent GI bleed on platelet therapy.  Will continue to monitor on the side.  Azalee Course, [place]  Patient is back from his interventional radiology procedure with embolization of the gastroduodenal artery and occluded with MVP-5 vascular plug.  Currently no abdominal pain.  No chest pain.  No shortness of breath.  Repeat ECG today.  Nicki Guadalajara  02/05/2024  2:09 PM

## 2024-02-05 NOTE — Consult Note (Signed)
 Chief Complaint: Patient was seen in consultation today for upper GI bleed  Referring Physician(s): Mike Gip, PA-C  Supervising Physician: Irish Lack  Patient Status: [facility] - In-pt  History of Present Illness: Anthony Coffey is a 68 y.o. male with past medical history of anxiety, CAD, COPD as a prior smoker, HTN, MI s/p cardiac cath with stent in 2018 on anti-coagulation and antiplatelet therapy at home.  He was admitted 4/7 with acute GI bleed. He underwent EGD for Dr. Myrtie Neither which revealed a non-bleeding duodenal ulcer with hematin in the gastric antrum as well as a duodenal ulcer with clot.  Hemostatic spray was applied and patient returned to unit for monitoring where he has done well in the setting of ongoing AC/AP hold.  Unfortuantly, patient with acute chest pain overnight with increase in troponins.  EKG and Cardiology evaluation overnight concerning for NSTEMI.  Patient was resumed on heparin gtt, however shortly thereafter developed acute GI bleeding with BRB in the bedside commode.  IR consulted for angiogram with embolization.   Case reviewed by Dr. Fredia Sorrow.  CTA performed overnight not definitive for a GI bleed, however localization of ulcer to the duodenum lends to empiric GDA embolization.   Past Medical History:  Diagnosis Date   Aneurysm (HCC)    Anxiety    CAD (coronary artery disease)    02/01/17 Cath DES--> OM, staged PCI DES -->mLAD, EF 55%   COPD (chronic obstructive pulmonary disease) (HCC)    smoker   Hypertension    high with anxiety   Myocardial infarction [facility])     Past Surgical History:  Procedure Laterality Date   ABDOMINAL AORTIC ANEURYSM REPAIR     CORONARY STENT INTERVENTION N/A 02/01/2017   Procedure: Coronary Stent Intervention;  Surgeon: Lyn Records, MD;  Location: [facility] INVASIVE CV LAB;  Service: Cardiovascular;  Laterality: N/A;   CORONARY STENT INTERVENTION N/A 02/03/2017   Procedure: Coronary Stent Intervention;  Surgeon: Tonny Bollman,  MD;  Location: [facility] INVASIVE CV LAB;  Service: Cardiovascular;  Laterality: N/A;   EAR CYST EXCISION Right 06/14/2013   Procedure: CYST REMOVAL RIGHT MANDIBLE;  Surgeon: Georgia Lopes, DDS;  Location: [facility] OR;  Service: Oral Surgery;  Laterality: Right;   ESOPHAGOGASTRODUODENOSCOPY N/A 02/02/2024   Procedure: EGD (ESOPHAGOGASTRODUODENOSCOPY);  Surgeon: Sherrilyn Rist, MD;  Location: [facility] ENDOSCOPY;  Service: Gastroenterology;  Laterality: N/A;   INTRAMEDULLARY (IM) NAIL INTERTROCHANTERIC Left 01/22/2020   Procedure: INTRAMEDULLARY (IM) NAIL INTERTROCHANTRIC;  Surgeon: Yolonda Kida, MD;  Location: [facility] OR;  Service: Orthopedics;  Laterality: Left;   LEFT HEART CATH AND CORONARY ANGIOGRAPHY N/A 02/01/2017   Procedure: Left Heart Cath and Coronary Angiography;  Surgeon: Lyn Records, MD;  Location: [facility] INVASIVE CV LAB;  Service: Cardiovascular;  Laterality: N/A;   TONSILLECTOMY     TOOTH EXTRACTION N/A 06/14/2013   Procedure: EXTRACTIONS 17, 23, 26, 27, 29, 32;  Surgeon: Georgia Lopes, DDS;  Location: [facility] OR;  Service: Oral Surgery;  Laterality: N/A;   VASCULAR SURGERY      Allergies: Patient has no known allergies.  Medications: Prior to Admission medications   Medication Sig Start Date End Date Taking? Authorizing Provider  acetaminophen (TYLENOL) 500 MG tablet Take 1 tablet (500 mg total) by mouth every 6 (six) hours as needed for headache (pain). 02/04/17  Yes Arty Baumgartner, NP     Family History  Problem Relation Age of Onset   Anxiety disorder Brother     Social History  Socioeconomic History   Marital status: Single    Spouse name: Not on file   Number of children: Not on file   Years of education: Not on file   Highest education level: Not on file  Occupational History   Not on file  Tobacco Use   Smoking status: Every Day    Current packs/day: 1.00    Average packs/day: 1 pack/day for 39.0 years (39.0 ttl pk-yrs)    Types: Cigarettes   Smokeless tobacco: Former   Substance and Sexual Activity   Alcohol use: No   Drug use: No   Sexual activity: Never  Other Topics Concern   Not on file  Social History Narrative   ** Merged History Encounter **       Social Drivers of Health   Financial Resource Strain: Not on file  Food Insecurity: Food Insecurity Present (02/02/2024)   Hunger Vital Sign    Worried About Running Out of Food in the Last Year: Sometimes true    Ran Out of Food in the Last Year: Sometimes true  Transportation Needs: No Transportation Needs (02/02/2024)   PRAPARE - Administrator, Civil Service (Medical): No    Lack of Transportation (Non-Medical): No  Physical Activity: Not on file  Stress: Not on file  Social Connections: Socially Isolated (02/02/2024)   Social Connection and Isolation Panel [NHANES]    Frequency of Communication with Friends and Family: Once a week    Frequency of Social Gatherings with Friends and Family: More than three times a week    Attends Religious Services: Never    Database administrator or Organizations: No    Attends Banker Meetings: Never    Marital Status: Divorced     Review of Systems: A 12 point ROS discussed and pertinent positives are indicated in the HPI above.  All other systems are negative.  Review of Systems  Constitutional:  Negative for fatigue and fever.  Respiratory:  Negative for cough and shortness of breath.   Cardiovascular:  Negative for chest pain.  Gastrointestinal:  Negative for abdominal pain, nausea and vomiting.  Musculoskeletal:  Negative for back pain.  Psychiatric/Behavioral:  Negative for behavioral problems and confusion.     Vital Signs: BP 109/76 (BP Location: Right Arm)   Pulse 90   Temp 98.2 F (36.8 C) (Oral)   Resp 20   Ht 5\' 9"  (1.753 m)   Wt 175 lb 0.7 oz (79.4 kg)   SpO2 100%   BMI 25.85 kg/m   Physical Exam Vitals and nursing note reviewed.  Constitutional:      Appearance: Normal appearance. He is not  ill-appearing.  HENT:     Mouth/Throat:     Mouth: Mucous membranes are moist.     Pharynx: Oropharynx is clear.  Cardiovascular:     Rate and Rhythm: Normal rate and regular rhythm.  Pulmonary:     Effort: Pulmonary effort is normal.     Breath sounds: Normal breath sounds.  Abdominal:     General: Abdomen is flat. There is no distension.     Palpations: Abdomen is soft.  Skin:    General: Skin is warm.  Neurological:     General: No focal deficit present.     Mental Status: He is alert and oriented to person, place, and time. Mental status is at baseline.  Psychiatric:        Mood and Affect: Mood normal.  Behavior: Behavior normal.        Thought Content: Thought content normal.        Judgment: Judgment normal.      MD Evaluation Airway: WNL Heart: WNL Abdomen: WNL Chest/ Lungs: WNL ASA  Classification: 3 Mallampati/Airway Score: Two   Imaging: CT ANGIO GI BLEED Result Date: 02/05/2024 CLINICAL DATA:  Rectal bleeding. Known duodenal ulcer. Acute mesenteric ischemia. EXAM: CTA ABDOMEN AND PELVIS WITHOUT AND WITH CONTRAST TECHNIQUE: Multidetector CT imaging of the abdomen and pelvis was performed using the standard protocol during bolus administration of intravenous contrast. Multiplanar reconstructed images and MIPs were obtained and reviewed to evaluate the vascular anatomy. RADIATION DOSE REDUCTION: This exam was performed according to the departmental dose-optimization program which includes automated exposure control, adjustment of the mA and/or kV according to patient size and/or use of iterative reconstruction technique. CONTRAST:  75mL OMNIPAQUE IOHEXOL 350 MG/ML SOLN COMPARISON:  02/02/2024 FINDINGS: VASCULAR Aorta: Extensive aortic atherosclerosis. Again seen are postsurgical changes from infrarenal abdominal aortic aneurysm repair. Maximum caliber of the abdominal aorta is 3.5 cm, image 119/10. Unchanged from previous exam. No signs of acute dissection. No  periaortic fluid or fat stranding. Celiac: Patent without evidence of aneurysm, dissection, vasculitis or significant stenosis. SMA: Patent without evidence of aneurysm, dissection, vasculitis or significant stenosis. Renals: Both renal arteries are patent without evidence of aneurysm, dissection, vasculitis, fibromuscular dysplasia or significant stenosis. IMA: Not visualized status post aortic repair. 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 On the right this measures 2.6 cm and on the left this measures 2.9 cm. Proximal Outflow: Bilateral common femoral and visualized portions of the superficial and profunda femoral arteries are patent without evidence of aneurysm, dissection, vasculitis or significant stenosis. Veins: No obvious venous abnormality within the limitations of this arterial phase study. Review of the MIP images confirms the above findings. NON-VASCULAR Lower chest: No acute abnormality. Hepatobiliary: No focal liver abnormality is seen. No gallstones, gallbladder wall thickening, or biliary dilatation. Pancreas: Unremarkable. No pancreatic ductal dilatation or surrounding inflammatory changes. Spleen: Normal in size without focal abnormality. Adrenals/Urinary Tract: Normal adrenal glands. No kidney mass or obstructive uropathy. 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 Urinary bladder appears normal. Stomach/Bowel: Stomach appears normal. Known descending duodenal ulceration is again noted. This measures 2.8 x 2.6 cm, image 85/10. There is surrounding soft tissue stranding as noted previously. On the precontrast images there is high density material identified within the pylorus and descending duodenum. This appears unchanged between the pre and postcontrast imaging without signs of active extravasation of IV contrast material within the lumen of the duodenum. Several foci of high attenuation material within the small bowel  loops also noted which remains unchanged between the pre and postcontrast images. No progressive areas of intraluminal contrast material identified to suggest active GI bleeding. No pathologic dilatation of the large or small bowel loops. No pneumatosis identified. No signs of bowel ischemia. Lymphatic: No signs of abdominopelvic adenopathy. Reproductive: Prostate is unremarkable. Other: No significant free fluid or fluid collections. No pneumoperitoneum. Musculoskeletal: Previous ORIF of the left proximal femur. Lumbar degenerative disc disease. No acute or suspicious osseous findings. IMPRESSION: 1. No signs of active GI bleeding. 2. Known descending duodenal ulcer with surrounding inflammatory changes. This measures 2.8 x 2.6 cm. 3. No signs of bowel obstruction or ischemia. 4. Stable appearance of infrarenal abdominal aortic aneurysm repair. Maximum caliber of the abdominal aorta is 3.5 cm. 5. 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 Electronically Signed   By: Signa Kell M.D.   On: 02/05/2024 07:07   DG ABD ACUTE 2+V W 1V CHEST Result Date: 02/05/2024 CLINICAL DATA:  Abdomen pain EXAM: DG ABDOMEN ACUTE WITH 1 VIEW CHEST COMPARISON:  02/24/2022 FINDINGS: Single-view chest demonstrates no acute airspace disease or effusion. Normal cardiac size. No pneumothorax. Supine and upright views of the abdomen demonstrate a nonobstructed gas pattern. No free air beneath the diaphragm. IMPRESSION: No active disease. Nonobstructed gas pattern. Electronically Signed   By: Jasmine Pang M.D.   On: 02/05/2024 02:52   CT Angio Abd/Pel W and/or Wo Contrast Result Date: 02/02/2024 CLINICAL DATA:  Evaluate for lower GI bleed. EXAM: CTA ABDOMEN AND PELVIS WITHOUT AND WITH CONTRAST TECHNIQUE: Multidetector CT imaging of the abdomen and pelvis was performed using the standard protocol during bolus administration of intravenous contrast. Multiplanar reconstructed images and MIPs were obtained and  reviewed to evaluate the vascular anatomy. RADIATION DOSE REDUCTION: This exam was performed according to the departmental dose-optimization program which includes automated exposure control, adjustment of the mA and/or kV according to patient size and/or use of iterative reconstruction technique. CONTRAST:  80mL OMNIPAQUE IOHEXOL 350 MG/ML SOLN COMPARISON:  None 02/12/2023 FINDINGS: VASCULAR Aorta: Extensive aortic atherosclerosis. Postsurgical changes from previous abdominal aortic aneurysm repair. The infrarenal abdominal aorta measures 3.5 by 3.5 cm, image 72/2. Previously 3.5 x 3.2 cm. Celiac: Patent without evidence of aneurysm, dissection, vasculitis or significant stenosis. SMA: Patent without evidence of aneurysm, dissection, vasculitis or significant stenosis. Renals: Both renal arteries are patent without evidence of aneurysm, dissection, vasculitis, fibromuscular dysplasia or significant stenosis. IMA: Not visualized. Inflow: Patent. Aneurysmal dilatation of the left common iliac artery measures 2.8 cm, image 97/12. Previously 2.7 cm. Right common iliac artery measures 2.6 cm, image 97/12. Previously 2.4 cm. Proximal Outflow: Bilateral common femoral and visualized portions of the superficial and profunda femoral arteries are patent without evidence of aneurysm, dissection, vasculitis or significant stenosis. Veins: No obvious venous abnormality within the limitations of this arterial phase study. Review of the MIP images confirms the above findings. NON-VASCULAR Lower chest: No acute abnormality. Hepatobiliary: No focal liver abnormality is seen. No gallstones, gallbladder wall thickening, or biliary dilatation. Pancreas: Unremarkable. No pancreatic ductal dilatation or surrounding inflammatory changes. Spleen: Normal in size without focal abnormality. Adrenals/Urinary Tract: Normal adrenal glands. No kidney stones or signs of obstructive uropathy. Bilateral renal calcifications are likely vascular  nature. Bilateral Bosniak class 1 kidney cysts are noted. The largest is in the left kidney measuring 2 cm. No follow-up imaging recommended. Urinary bladder is within normal limits. Stomach/Bowel: Stomach appears normal. Transmural ulceration along the posterior wall of the descending duodenum with surrounding soft tissue stranding, image 66/22 and image 55/12. Ulcer crater measures approximately 2.9 cm. No extraluminal gas identified to suggest free perforation. No signs abnormal intraluminal contrast extravasation to suggest active GI bleeding. No pathologic dilatation of the large or small bowel loops. Lymphatic: No signs of abdominopelvic adenopathy. Reproductive: Mild prostate gland enlargement with nodular thickening indenting the bladder base. Other: No significant free fluid or fluid collections. No signs of pneumoperitoneum. Musculoskeletal: Previous ORIF of the left femur. No acute or suspicious osseous findings. First degree anterolisthesis of L3 on L4. Degenerative disc disease noted at L3-4, L4-5 and L5-S1. IMPRESSION: 1. Transmural ulceration along the posterior wall of the descending duodenum with surrounding soft tissue stranding. No extraluminal gas identified to suggest free perforation. Cannot exclude contained duodenal ulcer perforation. 2. No signs  of active GI bleeding. 3. Postsurgical changes from previous abdominal aortic aneurysm repair. Infrarenal abdominal aortic aneurysm measures 3.5 x 3.5 cm. Previously 3.5 x 3.2 cm. 4. 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 Previously 2.7 cm and 2.4 cm respectively. 5.  Aortic Atherosclerosis (ICD10-I70.0). Electronically Signed   By: Signa Kell M.D.   On: 02/02/2024 10:12    Labs:  CBC: Recent Labs    02/03/24 0322 02/03/24 0919 02/03/24 1910 02/04/24 0940 02/04/24 1832 02/04/24 2333 02/05/24 0836  WBC 9.9 9.4  --   --   --  10.3 9.4  HGB 7.5* 7.6*   < >  9.9* 8.1* 8.1* 9.2*  HCT 21.7* 22.2*   < > 29.0* 23.6* 23.6* 26.9*  PLT 328 359  --   --   --  408* 430*   < > = values in this interval not displayed.    COAGS: Recent Labs    02/02/24 0636  INR 1.1    BMP: Recent Labs    02/02/24 0636 02/03/24 0322 02/04/24 2333 02/05/24 0836  NA 137 138 135 140  K 3.7 3.5 2.9* 3.4*  CL 103 106 104 106  CO2 24 23 19* 23  GLUCOSE 122* 94 111* 105*  BUN 20 9 5* <5*  CALCIUM 8.5* 8.3* 8.1* 8.2*  CREATININE 0.66 0.57* 0.69 0.71  GFRNONAA >60 >60 >60 >60    LIVER FUNCTION TESTS: Recent Labs    02/12/23 0907 02/02/24 0636  BILITOT 0.7 0.3  AST 25 30  ALT 19 15  ALKPHOS 58 55  PROT 7.2 5.5*  ALBUMIN 4.3 2.8*    TUMOR MARKERS: No results for input(s): "AFPTM", "CEA", "CA199", "CHROMGRNA" in the last 8760 hours.  Assessment and Plan: Patient with past medical history of CAD, MI, s/p PCI with stent, prior AAA repairs presents with complaint of GI bleed. He is s/p scope with GI revealing at duodenal ulcer, not bleeding at the time of EGD.   Overnight he developed acute chest pain with EKG changes concerning for acute MI.  He was started on heparin gtt and unfortunately shortly after developed recurrent BRBPR.  IR consulted for embolization at the request of GI. Case reviewed by Dr. Fredia Sorrow who approves patient for procedure.  Per RN, patient did have small bites of food this AM.   Discussed with Dr. Tresa Endo with Cardiology this AM.  Patient with concern for NSETMI undergoing appropriate work-up.  Hemostasis critical to care plan moving forward.  Ok to proceed with embolization today.   Discussed all with patient who understands and is agreeable as he endorses GI pain, stomach cramping as his main complaint.   The Risks and benefits of embolization were discussed with the patient including, but not limited to bleeding, infection, vascular injury, post operative pain, or contrast induced renal failure.  This procedure involves the use  of X-rays and because of the nature of the planned procedure, it is possible that we will have prolonged use of X-ray fluoroscopy.  Potential radiation risks to you include (but are not limited to) the following: - A slightly elevated risk for cancer several years later in life. This risk is typically less than 0.5% percent. This risk is low in comparison to the normal incidence of human cancer, which is 33% for women and 50% for men according to the [employer]. - Radiation induced injury can include skin redness, resembling a rash, tissue  breakdown / ulcers and hair loss (which can be temporary or permanent).   The likelihood of either of these occurring depends on the difficulty of the procedure and whether you are sensitive to radiation due to previous procedures, disease, or genetic conditions.   IF your procedure requires a prolonged use of radiation, you will be notified and given written instructions for further action.  It is your responsibility to monitor the irradiated area for the 2 weeks following the procedure and to notify your physician if you are concerned that you have suffered a radiation induced injury.    All of the patient's questions were answered, patient is agreeable to proceed. Consent signed and in chart.  Advance Care Plan: The advanced care plan/surrogate decision maker was discussed at the time of visit and documented in the medical record.    Thank you for this interesting consult.  I greatly enjoyed meeting Anthony Coffey and look forward to participating in their care.  A copy of this report was sent to the requesting provider on this date.  Electronically Signed: Hoyt Koch, PA 02/05/2024, 10:18 AM   I spent a total of 40 Minutes    in face to face in clinical consultation, greater than 50% of which was counseling/coordinating care for GI bleed.

## 2024-02-05 NOTE — Progress Notes (Signed)
 PROGRESS NOTE    Anthony Coffey  WGN:[id number] DOB: 04-Oct-1956 DOA: 02/02/2024 PCP: Pcp, No    Chief Complaint  Patient presents with   Rectal Bleeding    Brief Narrative:  68 year old male with medical history significant for hypertension, CAD/MI s/p staged PCI to OM and LAD 2018, COPD, tobacco abuse, abdominal aneurysm s/p repair, anxiety, uses Goody's powder twice weekly, presented to ED on 02/02/2024 with complaints of dark and bright red blood in stool and abdominal pain.  In ED, soft blood pressures of 104/51, hemoglobin 7.3, FOBT positive.  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  [facility] and general surgery were consulted.  EGD on 4/7 showed a large ulcer encompassing the entire distal duodenal bulb and the sweep without active bleeding, no visible vessel, ulcer about 30 mm in size with large overlying clot, treated with hemostatic spray.  In the absence of perforation, general surgery signed off.  Slowly advancing diet, monitor H&H closely and transfuse as needed.    Assessment & Plan:   Principal Problem:   GI bleed Active Problems:   Acute duodenal ulcer with bleeding   ABLA (acute blood loss anemia)   Leukocytosis   Transient hypotension   History of AAA (abdominal aortic aneurysm) repair   Tobacco abuse   Chronic duodenal ulcer with bleeding   Melena   Upper GI bleed   Anxiety   Chronic obstructive pulmonary disease (HCC)   Coronary artery disease involving native heart without angina pectoris   NSTEMI (non-ST elevated myocardial infarction) (HCC)  #1 acute upper GI bleed secondary to large duodenal ulcer -Patient had presented with abdominal pain and bloody bowel movements. -CT angiogram abdomen and pelvis done showed a transmural ulceration along the posterior wall of the descending duodenum with surrounding soft  tissue stranding.  No extraluminal gas identified to suggest free perforation.  No signs of active GI bleed. -Patient seen in consultation by GI, underwent upper endoscopy for 05/16/2024 which showed a large, 30 mm in largest diameter, duodenal ulcer encompassing the entire distal duodenal bulb and sweep, with large overlying clot.  Treated with hemostatic spray. -Gastric biopsy negative for H. pylori. -Patient diet advanced to a soft diet which she was tolerating and PPI initially changed to oral PPI twice daily yesterday.   -Patient noted to have had some chest pain overnight, concern for non-STEMI started on heparin drip, noted to have bloody bowel movement this morning.   -Patient transfuse 1 unit PRBCs, with repeat hemoglobin at 9.2.  -Stat CT angiogram abdomen and pelvis done with no signs of active GI bleed, known descending duodenal ulcer with surrounding inflammatory changes measures 2.8 x 2.6 cm.  No signs of bowel obstruction or ischemia.  Stable appearance of infrarenal abdominal aortic aneurysmal repair.  Maximal caliber of abdominal aorta is 3.5 cm.  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   -Patient placed back on IV PPI every 12 hours and started on Carafate per GI recommendations.   -IR consultation placed by GI for further evaluation due to ongoing GI bleed.   -Heparin discontinued due to ongoing GI bleed.   -Follow H&H.   -Transfusion threshold hemoglobin < 8.  -Patient counseled extensively on avoiding NSAIDs including BC powder and Goody's powder. -Patient seen in consultation by general surgery who have signed off in the absence of perforation. -Per GI.  2.  Acute blood loss anemia -Secondary to #1. -Hemoglobin noted at 16.4 on 02/12/2023 patient on presentation noted to have a hemoglobin of 7.3. -Status post transfusion 2 units PRBCs early on in the hospitalization. -Patient noted to have received a unit of PRBCs this morning for 08/16/2024 with  repeat CBC with hemoglobin currently at 9.2 this morning. -Continue serial H&H. -Transfusion threshold hemoglobin < 7.  3.  Non-STEMI -Patient noted to have complaints of midsternal chest pain overnight which was severe. -EKG done with no significant ischemic changes noted. -Cardiac enzymes noted to be elevated at 2634>>> 3235>>> 2508. -2D echo ordered and pending per cardiology. -Patient placed on IV heparin however due to GI bleed this morning heparin discontinued per GI. -Cardiology following appreciate input and recommendations.  4.  History of AAA -Status post aneurysmal repair and being followed by vascular surgeon in the outpatient setting. -CT imaging noted postsurgical changes from previous abdominal aortic aneurysm repair. -Infrarenal abdominal aortic aneurysm measuring 3.5 x 3.5 cm, previously 3.5 x 3.2 cm. 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 -Outpatient follow-up with vascular surgery.  5.  COPD/tobacco abuse -Tobacco cessation.  6.  CAD status post stents -Stable. -Patient not on antiplatelets or medications prior to admission. -Outpatient follow-up.  7.  Hypertension/hypotension -Patient not on antihypertensive medications prior to admission. -BP noted to be soft which was likely secondary to GI bleed. -BP stable.  8.  Anxiety disorder -Hydroxyzine as needed.    DVT prophylaxis: SCDs Code Status: Full Family Communication: Updated patient.  No family at bedside. Disposition: Home when clinically stable, hemoglobin stabilized and cleared by gastroenterology.  Status is: Inpatient Remains inpatient appropriate because: Severity of illness   Consultants:  General Surgery: Dr. Violeta Gelinas 02/02/2024 Gastroenterology: Dr.Danis III  IR 02/15/2024  Procedures:  CT angiogram abdomen and pelvis 02/02/2024, 02/05/2024 Upper endoscopy 02/02/2024: Per Dr. Myrtie Neither  III  Transfusion 2 units PRBCs 02/02/2024 Transfuse 1 unit PRBCs 02/05/2024  Antimicrobials:  Anti-infectives (From admission, onward)    Start     Dose/Rate Route Frequency Ordered Stop   02/02/24 1230  Ampicillin-Sulbactam (UNASYN) 3 g in sodium chloride 0.9 % 100 mL IVPB        3 g 200 mL/hr over 30 Minutes Intravenous  Once 02/02/24 1227 02/02/24 1840         Subjective: Patient sitting up in bed just received Carafate and IV PPI.  Patient now complaining of mid abdominal pain.  Denies any ongoing chest pain however noted to have chest pain last night which was substernal and very painful per patient.  Denies any shortness of breath.  Noted to have a bloody bowel movement this morning with blood clots noted in the toilet.  Objective: Vitals:   02/04/24 2354 02/05/24 0358 02/05/24 0445 02/05/24 0823  BP: (!) 91/56 121/75 (P) 134/83 109/76  Pulse:  63  90  Resp: 17 18  20   Temp:  98.3 F (36.8 C) (P) 98.5 F (36.9 C) 98.2 F (36.8 C)  TempSrc:  Oral (P) Oral Oral  SpO2:  100%    Weight:      Height:        Intake/Output Summary (Last 24 hours) at 02/05/2024 0953 Last data filed at 02/05/2024 0110 Gross per 24 hour  Intake 0 ml  Output --  Net 0 ml   Filed Weights   02/02/24 0620 02/02/24 1317  Weight: 79.4 kg 79.4  kg    Examination:  General exam: NAD Respiratory system: CTAB. No wheezes, no crackles, no rhonchi.  Fair air movement.  Speaking in full sentences.  Cardiovascular system: RRR no murmurs rubs or gallops.  No JVD.  No pitting lower extremity edema.  Gastrointestinal system: Abdomen is soft, nondistended, some tenderness to palpation in the mid abdominal region, positive bowel sounds.  No rebound.  No guarding.   Central nervous system: Alert and oriented. No focal neurological deficits. Extremities: Symmetric 5 x 5 power. Skin: No rashes, lesions or ulcers Psychiatry: Judgement and insight appear normal. Mood & affect appropriate.     Data Reviewed:  I have personally reviewed following labs and imaging studies  CBC: Recent Labs  Lab 02/02/24 0636 02/02/24 1748 02/03/24 0322 02/03/24 0919 02/03/24 1910 02/04/24 0310 02/04/24 0940 02/04/24 1832 02/04/24 2333 02/05/24 0836  WBC 13.1*  --  9.9 9.4  --   --   --   --  10.3 9.4  NEUTROABS 10.6*  --   --   --   --   --   --   --   --  7.3  HGB 7.3*   < > 7.5* 7.6*   < > 8.4* 9.9* 8.1* 8.1* 9.2*  HCT 22.2*   < > 21.7* 22.2*   < > 24.3* 29.0* 23.6* 23.6* 26.9*  MCV 91.4  --  87.1 86.7  --   --   --   --  88.1 88.5  PLT 482*  --  328 359  --   --   --   --  408* 430*   < > = values in this interval not displayed.    Basic Metabolic Panel: Recent Labs  Lab 02/02/24 0636 02/03/24 0322 02/04/24 2333 02/05/24 0836  NA 137 138 135 140  K 3.7 3.5 2.9* 3.4*  CL 103 106 104 106  CO2 24 23 19* 23  GLUCOSE 122* 94 111* 105*  BUN 20 9 5* <5*  CREATININE 0.66 0.57* 0.69 0.71  CALCIUM 8.5* 8.3* 8.1* 8.2*  MG  --   --   --  1.9    GFR: Estimated Creatinine Clearance: 92.1 mL/min (by C-G formula based on SCr of 0.71 mg/dL).  Liver Function Tests: Recent Labs  Lab 02/02/24 0636  AST 30  ALT 15  ALKPHOS 55  BILITOT 0.3  PROT 5.5*  ALBUMIN 2.8*    CBG: No results for input(s): "GLUCAP" in the last 168 hours.   No results found for this or any previous visit (from the past 240 hours).       Radiology Studies: CT ANGIO GI BLEED Result Date: 02/05/2024 CLINICAL DATA:  Rectal bleeding. Known duodenal ulcer. Acute mesenteric ischemia. EXAM: CTA ABDOMEN AND PELVIS WITHOUT AND WITH CONTRAST TECHNIQUE: Multidetector CT imaging of the abdomen and pelvis was performed using the standard protocol during bolus administration of intravenous contrast. Multiplanar reconstructed images and MIPs were obtained and reviewed to evaluate the vascular anatomy. RADIATION DOSE REDUCTION: This exam was performed according to the departmental dose-optimization program which includes automated  exposure control, adjustment of the mA and/or kV according to patient size and/or use of iterative reconstruction technique. CONTRAST:  75mL OMNIPAQUE IOHEXOL 350 MG/ML SOLN COMPARISON:  02/02/2024 FINDINGS: VASCULAR Aorta: Extensive aortic atherosclerosis. Again seen are postsurgical changes from infrarenal abdominal aortic aneurysm repair. Maximum caliber of the abdominal aorta is 3.5 cm, image 119/10. Unchanged from previous exam. No signs of acute dissection. No periaortic fluid or fat stranding.  Celiac: Patent without evidence of aneurysm, dissection, vasculitis or significant stenosis. SMA: Patent without evidence of aneurysm, dissection, vasculitis or significant stenosis. Renals: Both renal arteries are patent without evidence of aneurysm, dissection, vasculitis, fibromuscular dysplasia or significant stenosis. IMA: Not visualized status post aortic repair. 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 On the right this measures 2.6 cm and on the left this measures 2.9 cm. Proximal Outflow: Bilateral common femoral and visualized portions of the superficial and profunda femoral arteries are patent without evidence of aneurysm, dissection, vasculitis or significant stenosis. Veins: No obvious venous abnormality within the limitations of this arterial phase study. Review of the MIP images confirms the above findings. NON-VASCULAR Lower chest: No acute abnormality. Hepatobiliary: No focal liver abnormality is seen. No gallstones, gallbladder wall thickening, or biliary dilatation. Pancreas: Unremarkable. No pancreatic ductal dilatation or surrounding inflammatory changes. Spleen: Normal in size without focal abnormality. Adrenals/Urinary Tract: Normal adrenal glands. No kidney mass or obstructive uropathy. 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 Urinary bladder appears normal. Stomach/Bowel: Stomach appears normal. Known descending duodenal  ulceration is again noted. This measures 2.8 x 2.6 cm, image 85/10. There is surrounding soft tissue stranding as noted previously. On the precontrast images there is high density material identified within the pylorus and descending duodenum. This appears unchanged between the pre and postcontrast imaging without signs of active extravasation of IV contrast material within the lumen of the duodenum. Several foci of high attenuation material within the small bowel loops also noted which remains unchanged between the pre and postcontrast images. No progressive areas of intraluminal contrast material identified to suggest active GI bleeding. No pathologic dilatation of the large or small bowel loops. No pneumatosis identified. No signs of bowel ischemia. Lymphatic: No signs of abdominopelvic adenopathy. Reproductive: Prostate is unremarkable. Other: No significant free fluid or fluid collections. No pneumoperitoneum. Musculoskeletal: Previous ORIF of the left proximal femur. Lumbar degenerative disc disease. No acute or suspicious osseous findings. IMPRESSION: 1. No signs of active GI bleeding. 2. Known descending duodenal ulcer with surrounding inflammatory changes. This measures 2.8 x 2.6 cm. 3. No signs of bowel obstruction or ischemia. 4. Stable appearance of infrarenal abdominal aortic aneurysm repair. Maximum caliber of the abdominal aorta is 3.5 cm. 5. 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 Electronically Signed   By: Signa Kell M.D.   On: 02/05/2024 07:07   DG ABD ACUTE 2+V W 1V CHEST Result Date: 02/05/2024 CLINICAL DATA:  Abdomen pain EXAM: DG ABDOMEN ACUTE WITH 1 VIEW CHEST COMPARISON:  02/24/2022 FINDINGS: Single-view chest demonstrates no acute airspace disease or effusion. Normal cardiac size. No pneumothorax. Supine and upright views of the abdomen demonstrate a nonobstructed gas pattern. No free air beneath the diaphragm. IMPRESSION: No active disease. Nonobstructed  gas pattern. Electronically Signed   By: Jasmine Pang M.D.   On: 02/05/2024 02:52        Scheduled Meds:  sodium chloride   Intravenous Once   sodium chloride   Intravenous Once   atorvastatin  80 mg Oral Daily   nicotine  21 mg Transdermal Daily   pantoprazole (PROTONIX) IV  40 mg Intravenous TID   sodium chloride flush  3 mL Intravenous Q12H   sucralfate  1 g Oral TID WC & HS   Continuous Infusions:   LOS: 2 days    Time spent: 45 minutes    Ramiro Harvest, MD [facility]   To contact the attending  provider between 7A-7P or the covering provider during after hours 7P-7A, please log into the web site [email or web address] and access using universal [place] password for that web site. If you do not have the password, please call the hospital operator.  02/05/2024, 9:53 AM

## 2024-02-05 NOTE — Progress Notes (Signed)
 I received a call from Dr. Lendell Caprice from Cardiology regarding this patient this evening.  He was called due to chest pain and elevated troponin at 2634 --> 3235.  Patient is now chest pain-free and ECG reviewed by the Cardiologist.  Certainly elevated concern for ACS in a patient with known CAD and prior STEMI.  I reviewed EGD from 02/02/2024 which was notable for a large 3 cm deeply cratered ulcer in the duodenum with overlying clot and stricture on both the proximal and distal side of the ulcer bed.  Entire clot bed was sprayed with Nexpowder.  H/H has remained stable, currently 8.1/23.6 without any overt bleeding and currently with normal BUN at 5.  He is otherwise HD stable.  Very tricky clinical situation as the patient needs anticoagulation but currently admitted for upper GI bleed with large duodenal ulcer.  Further complicating his clinical picture is CTA findings from 4/7 which show extensive aortic atherosclerosis, postsurgical changes from previous AAA repair.  While he is at elevated risk for rebleed with anticoagulation, given concerns for active ACS, I feel that if the Cardiology team feels that anticoagulation is necessary, that is our best option at this juncture.  The hope would be that the ulcer bed is clear and healing, but would certainly need to monitor very closely for any evidence of rebleed.  If there is rebleeding, will need IR and General Surgery consultation as there is likely minimal endoscopic intervention available to him given the recent endoscopic findings.  Plan for heparin gtt without bolus now per conversation with Cardiology. Would hold off on antiplatelets. Recommend giving 1 unit RBCs now, continue high-dose IV Protonix, and can add Carafate.  GI service will see again in the morning.  Doristine Locks, DO, [facility]

## 2024-02-05 NOTE — Progress Notes (Signed)
 Initial Nutrition Assessment  DOCUMENTATION CODES:  Severe malnutrition in context of chronic illness  INTERVENTION:  Diet advancement per GI; currently on full liquids Change nutrition supplement from Boost Breeze to Ensure Enlive po BID, each supplement provides 350 kcal and 20 grams of protein. MVI with minerals daily  NUTRITION DIAGNOSIS:  Severe Malnutrition related to chronic illness as evidenced by severe fat depletion, severe muscle depletion.  GOAL:  Patient will meet greater than or equal to 90% of their needs  MONITOR:  PO intake, Supplement acceptance, Labs, Weight trends  REASON FOR ASSESSMENT:  Malnutrition Screening Tool    ASSESSMENT:  Pt admitted with c/o bloody stool and abdominal pain. PMH significant for HTN, CAD, abdominal aneurysm s/p repair.  4/7: CT imaging- transmural ulceration along posterior wall of descending duodenum; UGI endoscopy- hematin in gastric antrum. Duodenal stenosis, non-bleeding duodenal ulcer with adherent clot, several biopsies obtained 4/8: clear liquid diet-> full liquid diet 4/9: diet advanced to GI soft; rapid response called d/t chest pain 4/10: NSTEMI; worsening periumbilical pain, recurrent GIB; s/p GDA embolization; NPO ->GI soft  Spoke with pt at bedside. He reports feeling somewhat better today.  Denies abdominal pain and states that he tolerated his breakfast of eggs and breakfast potatoes well. Noted GI diet downgraded to full liquids for today and if no further evidence of bleeding during the day, can advance to GI soft.   Pt states that he typically eats 2-3 meals per day. Over the last month d/t abdominal pain he has been eating about 75-100% less than normal. He denies PO intake except maybe a half of a sandwich within the last month. Pt denies difficulty chewing/swallowing foods. Provided Ensure for pt to try at time of visit and he is amenable to receive these supplements during admission to promote adequate nutritional  intake.   Within the last month, pt reports losing about 25 lbs stating that his initial weight has been about 175 lbs and has dropped to 157 lbs a week or so ago.    Admit weight: 79.4 kg Unfortunately, there is limited documentation of weight history on file to review over the last year. April 2024, pt's weight was documented to be 81.6 kg. Measured weight on admission on 4/7 was 79.4 kg. No updated weight since that time.   Medications: carafate QID  Labs:  BUN 5 Hgb 7.5  NUTRITION - FOCUSED PHYSICAL EXAM: Despite pt's report of decreased PO intake over the last month, suspect malnutrition to be more chronic in nature as review of chart reflects pt with multiple ED visits over the last year for abdominal pain and duodenal ulcer.  Flowsheet Row Most Recent Value  Orbital Region Severe depletion  Upper Arm Region Severe depletion  Thoracic and Lumbar Region Severe depletion  Buccal Region Severe depletion  Temple Region Moderate depletion  Clavicle Bone Region Severe depletion  Clavicle and Acromion Bone Region Severe depletion  Scapular Bone Region Severe depletion  Dorsal Hand No depletion  Patellar Region Moderate depletion  Anterior Thigh Region Severe depletion  Posterior Calf Region Moderate depletion  Edema (RD Assessment) None  Hair Reviewed  Eyes Reviewed  Mouth Reviewed  Skin Reviewed  Nails Reviewed   Diet Order:   Diet Order             Diet full liquid Fluid consistency: Thin  Diet effective now                   EDUCATION NEEDS:  Education needs  have been addressed  Skin:  Skin Assessment: Reviewed RN Assessment  Last BM:  4/11  Height:  Ht Readings from Last 1 Encounters:  02/02/24 5\' 9"  (1.753 m)    Weight:  Wt Readings from Last 1 Encounters:  02/02/24 79.4 kg   BMI:  Body mass index is 25.85 kg/m.  Estimated Nutritional Needs:   Kcal:  2000-2200  Protein:  105-120g  Fluid:  >/=2L  Drusilla Kanner, RDN, LDN Clinical  Nutrition See AMiON for contact information.

## 2024-02-05 NOTE — Progress Notes (Signed)
 Patient called out c/o chest pain mid sternal. Pain 10 out of 10. No radiation. This is new onset of CP. Notified Dr. Toniann Fail. EKG done; stat labs ordered and collected. Abd 1 view ordered.  Patient given nitro x 2. Pain is now a 2-3. B/p soft.   Will continue to monitor

## 2024-02-05 NOTE — Plan of Care (Signed)
   Problem: Clinical Measurements: Goal: Will remain free from infection Outcome: Progressing Goal: Diagnostic test results will improve Outcome: Progressing

## 2024-02-05 NOTE — Progress Notes (Signed)
 Patient reported having a large BM this morning with red blood. MD notified.  Kenard Gower, RN

## 2024-02-05 NOTE — Progress Notes (Signed)
 Discussed case w/ IR. They are planning for GDA embolization. Agree with plan.  Please notify us with any questions or concerns. We will remains available as needed.   Jacinto Halim, PA-C

## 2024-02-06 DIAGNOSIS — Z9889 Other specified postprocedural states: Secondary | ICD-10-CM | POA: Diagnosis not present

## 2024-02-06 DIAGNOSIS — E43 Unspecified severe protein-calorie malnutrition: Secondary | ICD-10-CM | POA: Insufficient documentation

## 2024-02-06 DIAGNOSIS — K260 Acute duodenal ulcer with hemorrhage: Secondary | ICD-10-CM | POA: Diagnosis not present

## 2024-02-06 DIAGNOSIS — K921 Melena: Secondary | ICD-10-CM | POA: Diagnosis not present

## 2024-02-06 DIAGNOSIS — K264 Chronic or unspecified duodenal ulcer with hemorrhage: Secondary | ICD-10-CM | POA: Diagnosis not present

## 2024-02-06 DIAGNOSIS — I2510 Atherosclerotic heart disease of native coronary artery without angina pectoris: Secondary | ICD-10-CM | POA: Diagnosis not present

## 2024-02-06 DIAGNOSIS — F419 Anxiety disorder, unspecified: Secondary | ICD-10-CM | POA: Diagnosis not present

## 2024-02-06 DIAGNOSIS — D62 Acute posthemorrhagic anemia: Secondary | ICD-10-CM | POA: Diagnosis not present

## 2024-02-06 DIAGNOSIS — Z720 Tobacco use: Secondary | ICD-10-CM | POA: Diagnosis not present

## 2024-02-06 LAB — TYPE AND SCREEN
ABO/RH(D): O POS
Antibody Screen: NEGATIVE
Unit division: 0
Unit division: 0
Unit division: 0
Unit division: 0
Unit division: 0
Unit division: 0
Unit division: 0
Unit division: 0

## 2024-02-06 LAB — BPAM RBC
Blood Product Expiration Date: 202504302359
Blood Product Expiration Date: 202505072359
Blood Product Expiration Date: 202504292359
Blood Product Expiration Date: 202504292359
Blood Product Expiration Date: 202504302359
Blood Product Unit Number: 202504172359
Blood Product Unit Number: 202505072359
Blood Product Unit Number: 202505072359
ISSUE DATE / TIME: 202504110905
ISSUE DATE / TIME: 202504070915
ISSUE DATE / TIME: 202504071240
ISSUE DATE / TIME: 202504100413
Unit Type and Rh: 202504172359
Unit Type and Rh: 202505072359
Unit Type and Rh: 202505072359
Unit Type and Rh: 202505072359
Unit Type and Rh: 5100
Unit Type and Rh: 5100
Unit Type and Rh: 5100
Unit Type and Rh: 5100
Unit Type and Rh: 202504302359
Unit Type and Rh: 5100
Unit Type and Rh: 5100
Unit Type and Rh: 5100
Unit Type and Rh: 5100

## 2024-02-06 LAB — HEMOGLOBIN AND HEMATOCRIT, BLOOD
HCT: 22.3 % — ABNORMAL LOW (ref 39.0–52.0)
HCT: 22.2 % — ABNORMAL LOW (ref 39.0–52.0)
Hemoglobin: 7.5 g/dL — ABNORMAL LOW (ref 13.0–17.0)
Hemoglobin: 7.5 g/dL — ABNORMAL LOW (ref 13.0–17.0)

## 2024-02-06 LAB — BASIC METABOLIC PANEL WITH GFR
Anion gap: 7 (ref 5–15)
BUN: 5 mg/dL — ABNORMAL LOW (ref 8–23)
CO2: 21 mmol/L — ABNORMAL LOW (ref 22–32)
Calcium: 8.3 mg/dL — ABNORMAL LOW (ref 8.9–10.3)
Chloride: 108 mmol/L (ref 98–111)
Creatinine, Ser: 0.66 mg/dL (ref 0.61–1.24)
GFR, Estimated: >60 mL/min (ref >60)
Glucose, Bld: 108 mg/dL — ABNORMAL HIGH (ref 70–99)
Potassium: 3.6 mmol/L (ref 3.5–5.1)
Sodium: 136 mmol/L (ref 135–145)

## 2024-02-06 LAB — CBC
HCT: 22.3 % — ABNORMAL LOW (ref 39.0–52.0)
Hemoglobin: 7.6 g/dL — ABNORMAL LOW (ref 13.0–17.0)
MCH: 30 pg (ref 26.0–34.0)
MCHC: 34.1 g/dL (ref 30.0–36.0)
MCV: 88.1 fL (ref 80.0–100.0)
Platelets: 381 10*3/uL (ref 150–400)
RBC: 2.53 MIL/uL — ABNORMAL LOW (ref 4.22–5.81)
RDW: 14.5 % (ref 11.5–15.5)
WBC: 10.2 10*3/uL (ref 4.0–10.5)
nRBC: 0 % (ref 0.0–0.2)

## 2024-02-06 LAB — PREPARE RBC (CROSSMATCH)

## 2024-02-06 LAB — MAGNESIUM: Magnesium: 2.1 mg/dL (ref 1.7–2.4)

## 2024-02-06 MED ORDER — SODIUM CHLORIDE 0.9% IV SOLUTION: Freq: Once | INTRAVENOUS | Status: AC

## 2024-02-06 MED ORDER — ACETAMINOPHEN 325 MG PO TABS
650.0000 mg | ORAL_TABLET | Freq: Once | ORAL | Status: AC
  Administered 2024-02-06: 650 mg via ORAL
  Filled 2024-02-06: qty 2

## 2024-02-06 MED ORDER — FUROSEMIDE 10 MG/ML IJ SOLN
20.0000 mg | Freq: Once | INTRAMUSCULAR | Status: AC
  Administered 2024-02-06: 20 mg via INTRAVENOUS
  Filled 2024-02-06: qty 2

## 2024-02-06 MED ORDER — ADULT MULTIVITAMIN W/MINERALS CH
1.0000 | ORAL_TABLET | Freq: Every day | ORAL | Status: DC
  Administered 2024-02-06 – 2024-02-08 (×3): 1 via ORAL
  Filled 2024-02-06 (×3): qty 1

## 2024-02-06 MED ORDER — ENSURE ENLIVE PO LIQD
237.0000 mL | Freq: Two times a day (BID) | ORAL | Status: DC
  Administered 2024-02-06 – 2024-02-07 (×2): 237 mL via ORAL

## 2024-02-06 NOTE — Progress Notes (Signed)
 Mobility Specialist Progress Note:    02/06/24 1000  Mobility  Activity Ambulated with assistance in hallway;Ambulated with assistance in room  Level of Assistance Standby assist, set-up cues, supervision of patient - no hands on  Assistive Device None  Distance Ambulated (ft) 120 ft  Activity Response Tolerated well  Mobility Referral Yes  Mobility visit 1 Mobility  Mobility Specialist Start Time (ACUTE ONLY) 1000  Mobility Specialist Stop Time (ACUTE ONLY) 1011  Mobility Specialist Time Calculation (min) (ACUTE ONLY) 11 min   Pt received in bed, agreeable to mobility session. Ambulated in hallway, no AD required. SBA for safety. Tolerated well, asx throughout. Limited by fatigue. Returned pt to room, all needs met, call bell in reach.   Feliciana Rossetti Mobility Specialist Please contact via Special educational needs teacher or  Rehab office at [phone]

## 2024-02-06 NOTE — Progress Notes (Signed)
 PROGRESS NOTE    Anthony Coffey  ZOX:[id number] DOB: 1955/12/18 DOA: 02/02/2024 PCP: Pcp, No    Chief Complaint  Patient presents with   Rectal Bleeding    Brief Narrative:  68 year old male with medical history significant for hypertension, CAD/MI s/p staged PCI to OM and LAD 2018, COPD, tobacco abuse, abdominal aneurysm s/p repair, anxiety, uses Goody's powder twice weekly, presented to ED on 02/02/2024 with complaints of dark and bright red blood in stool and abdominal pain.  In ED, soft blood pressures of 104/51, hemoglobin 7.3, FOBT positive.  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  [facility] and general surgery were consulted.  EGD on 4/7 showed a large ulcer encompassing the entire distal duodenal bulb and the sweep without active bleeding, no visible vessel, ulcer about 30 mm in size with large overlying clot, treated with hemostatic spray.  In the absence of perforation, general surgery signed off.  Slowly advancing diet, monitor H&H closely and transfuse as needed.    Assessment & Plan:   Principal Problem:   GI bleed Active Problems:   Acute duodenal ulcer with bleeding   ABLA (acute blood loss anemia)   Leukocytosis   Transient hypotension   History of AAA (abdominal aortic aneurysm) repair   Tobacco abuse   Chronic duodenal ulcer with bleeding   Melena   Upper GI bleed   Anxiety   Chronic obstructive pulmonary disease (HCC)   Coronary artery disease involving native heart without angina pectoris   NSTEMI (non-ST elevated myocardial infarction) (HCC)   Protein-calorie malnutrition, severe  #1 acute upper GI bleed secondary to large duodenal ulcer -Patient had presented with abdominal pain and bloody bowel movements. -CT angiogram abdomen and pelvis done showed a transmural ulceration along the posterior wall of the  descending duodenum with surrounding soft tissue stranding.  No extraluminal gas identified to suggest free perforation.  No signs of active GI bleed. -Patient seen in consultation by GI, underwent upper endoscopy for 05/16/2024 which showed a large, 30 mm in largest diameter, duodenal ulcer encompassing the entire distal duodenal bulb and sweep, with large overlying clot.  Treated with hemostatic spray. -Gastric biopsy negative for H. pylori. -Patient diet advanced to a soft diet which she was tolerating and PPI initially changed to oral PPI twice daily yesterday.   -Patient noted to have had some chest pain overnight, concern for non-STEMI started on heparin drip, noted to have bloody bowel movement this morning.   -Patient status post transfusion of 3 units PRBCs during this hospitalization with hemoglobin currently at 7.5 this morning.  -CT angiogram abdomen and pelvis done (02/05/2024) with no signs of active GI bleed, known descending duodenal ulcer with surrounding inflammatory changes measures 2.8 x 2.6 cm.  No signs of bowel obstruction or ischemia.  Stable appearance of infrarenal abdominal aortic aneurysmal repair.  Maximal caliber of abdominal aorta is 3.5 cm.  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   -Patient placed back on IV PPI every 12 hours and started on Carafate per GI recommendations.   -IR consultation placed by GI for further evaluation due to ongoing GI bleed.   -Heparin discontinued due to ongoing GI bleed.   -Patient seen by IR and patient status post celiac and GDA arteriography and embolization of gastroduodenal artery by IR on 02/05/2024. -Patient stated had some bloody bowel movements overnight. -Hemoglobin  currently at 7.5 this morning and will transfuse 2 units PRBCs. -Follow H&H.   -Transfusion threshold hemoglobin < 8.  -Patient counseled extensively on avoiding NSAIDs including BC powder and Goody's powder. -Patient seen in consultation by  general surgery who have signed off in the absence of perforation. -Per GI.  2.  Acute blood loss anemia -Secondary to #1. -Hemoglobin noted at 16.4 on 02/12/2023 patient on presentation noted to have a hemoglobin of 7.3. -Status post transfusion 2 units PRBCs early on in the hospitalization. -Patient noted to have received a unit of PRBCs on 02/05/2024 with repeat CBC with hemoglobin currently at 7.5 this morning. -Transfuse 2 units PRBCs. -Continue serial H&H. -Transfusion threshold hemoglobin < 8.  3.  Non-STEMI/CAD status post PCI to OM/LAD 2018 -Patient noted to have complaints of midsternal chest pain overnight which was severe. -EKG done with no significant ischemic changes noted. -Cardiac enzymes noted to be elevated at 2634>>> 3235>>> 2508. -2D echo done with a EF of 60 to 65%, WMA, normal right ventricular systolic function.   -Patient was placed on IV heparin however due to GI bleed, heparin was discontinued on 02/05/2024.  -Cardiology following and suspect patient may have a type I or type II non-STEMI secondary to demand in the setting of GI bleed and anemia.   -Per cardiology due to GI bleeding and anemia patient is not a candidate for invasive study at this time and recommending medical management.   -Goal to keep hemoglobin > 8. -Cardiology following appreciate input and recommendations.  4.  History of AAA -Status post aneurysmal repair and being followed by vascular surgeon in the outpatient setting. -CT imaging noted postsurgical changes from previous abdominal aortic aneurysm repair. -Infrarenal abdominal aortic aneurysm measuring 3.5 x 3.5 cm, previously 3.5 x 3.2 cm. 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 -Outpatient follow-up with vascular surgery.  5.  COPD/tobacco abuse -Tobacco cessation.  6.  CAD status post stents -Stable. -Patient not on antiplatelets  or medications prior to admission. -Outpatient follow-up.  7.  Hypertension/hypotension -Patient not on antihypertensive medications prior to admission. -BP noted to be soft which was likely secondary to GI bleed. -BP stable.  8.  Anxiety disorder -Continue hydroxyzine as needed.    DVT prophylaxis: SCDs Code Status: Full Family Communication: Updated patient.  No family at bedside. Disposition: Home when clinically stable, hemoglobin stabilized and cleared by gastroenterology.  Status is: Inpatient Remains inpatient appropriate because: Severity of illness   Consultants:  General Surgery: Dr. Violeta Gelinas 02/02/2024 Gastroenterology: Dr.Danis III  IR 02/05/2024: Dr. Fredia Sorrow  Procedures:  CT angiogram abdomen and pelvis 02/02/2024, 02/05/2024 Upper endoscopy 02/02/2024: Per Dr. Myrtie Neither III  Transfusion 2 units PRBCs 02/02/2024 Transfuse 1 unit PRBCs 02/05/2024 Transfuse 2 units PRBCs 02/06/2024 Celiac and GDA arteriography; embolization of GDA per IR: Dr. Fredia Sorrow 02/05/2024 2D echo 02/05/2024  Antimicrobials:  Anti-infectives (From admission, onward)    Start     Dose/Rate Route Frequency Ordered Stop   02/05/24 1147  ceFAZolin (ANCEF) IVPB 2g/100 mL premix        over 30 Minutes Intravenous Continuous PRN 02/05/24 1158 02/05/24 1147   02/02/24 1230  Ampicillin-Sulbactam (UNASYN) 3 g in sodium chloride 0.9 % 100 mL IVPB        3 g 200 mL/hr over 30 Minutes Intravenous  Once 02/02/24 1227 02/02/24 1840         Subjective: Patient on  the commode having bowel movement.  Stated had some bloody bowel movement overnight.  Overall feeling better than he did yesterday.  Still with some dizziness but improving.  Denies any chest pain.  No abdominal pain.  Tolerating current diet.   Objective: Vitals:   02/06/24 1730 02/06/24 1749 02/06/24 1807 02/06/24 1825  BP: 117/81 119/72 119/72 133/65  Pulse: 73 78 74 88  Resp: 17 14 19  (!) 21  Temp: 98.3 F (36.8 C) 97.8 F (36.6 C) 97.8  F (36.6 C) 98 F (36.7 C)  TempSrc: Oral Oral Oral Oral  SpO2: 99% 94% 98% 100%  Weight:      Height:        Intake/Output Summary (Last 24 hours) at 02/06/2024 1857 Last data filed at 02/06/2024 1813 Gross per 24 hour  Intake 1364.83 ml  Output --  Net 1364.83 ml   Filed Weights   02/02/24 0620 02/02/24 1317  Weight: 79.4 kg 79.4 kg    Examination:  General exam: NAD Respiratory system: Lungs clear to auscultation bilaterally.  No wheezes, no crackles, no rhonchi.  Fair air movement.  Speaking in full sentences.  Cardiovascular system: Regular rate rhythm no murmurs rubs or gallops.  No JVD.  No pitting lower extremity edema.  Gastrointestinal system: Abdomen is soft, nontender, nondistended, positive bowel sounds.  No rebound.  No guarding.   Central nervous system: Alert and oriented. No focal neurological deficits. Extremities: Symmetric 5 x 5 power. Skin: No rashes, lesions or ulcers Psychiatry: Judgement and insight appear normal. Mood & affect appropriate.     Data Reviewed: I have personally reviewed following labs and imaging studies  CBC: Recent Labs  Lab 02/02/24 0636 02/02/24 1748 02/03/24 0322 02/03/24 0919 02/03/24 1910 02/04/24 2333 02/05/24 0836 02/05/24 1015 02/06/24 0356 02/06/24 0920  WBC 13.1*  --  9.9 9.4  --  10.3 9.4  --  10.2  --   NEUTROABS 10.6*  --   --   --   --   --  7.3  --   --   --   HGB 7.3*   < > 7.5* 7.6*   < > 8.1* 9.2* 8.3* 7.6*  7.5* 7.5*  HCT 22.2*   < > 21.7* 22.2*   < > 23.6* 26.9* 24.1* 22.3*  22.3* 22.2*  MCV 91.4  --  87.1 86.7  --  88.1 88.5  --  88.1  --   PLT 482*  --  328 359  --  408* 430*  --  381  --    < > = values in this interval not displayed.    Basic Metabolic Panel: Recent Labs  Lab 02/02/24 0636 02/03/24 0322 02/04/24 2333 02/05/24 0836 02/06/24 0356  NA 137 138 135 140 136  K 3.7 3.5 2.9* 3.4* 3.6  CL 103 106 104 106 108  CO2 24 23 19* 23 21*  GLUCOSE 122* 94 111* 105* 108*  BUN 20 9 5*  <5* 5*  CREATININE 0.66 0.57* 0.69 0.71 0.66  CALCIUM 8.5* 8.3* 8.1* 8.2* 8.3*  MG  --   --   --  1.9 2.1    GFR: Estimated Creatinine Clearance: 92.1 mL/min (by C-G formula based on SCr of 0.66 mg/dL).  Liver Function Tests: Recent Labs  Lab 02/02/24 0636  AST 30  ALT 15  ALKPHOS 55  BILITOT 0.3  PROT 5.5*  ALBUMIN 2.8*    CBG: No results for input(s): "GLUCAP" in the last 168 hours.   No  results found for this or any previous visit (from the past 240 hours).       Radiology Studies: IR Angiogram Visceral Selective Result Date: 02/05/2024 INDICATION: Persistent bleeding from duodenal bulb ulcer after prior endoscopic intervention 3 days ago. EXAM: 1. ULTRASOUND GUIDANCE FOR VASCULAR ACCESS OF THE RIGHT COMMON FEMORAL ARTERY 2. SELECTIVE ARTERIOGRAPHY OF THE CELIAC AXIS 3. SELECTIVE ARTERIOGRAPHY OF THE GASTRODUODENAL ARTERY 4. TRANSCATHETER EMBOLIZATION OF THE GASTRODUODENAL ARTERY TO TREAT ARTERIAL HEMORRHAGE MEDICATIONS: 2 G IV ANCEF. The antibiotic was administered within 1 hour of the procedure ANESTHESIA/SEDATION: Moderate (conscious) sedation was employed during this procedure. A total of Versed 4.0 mg was administered intravenously. Moderate Sedation Time: 65 minutes. The patient's level of consciousness and vital signs were monitored continuously by radiology nursing throughout the procedure under my direct supervision. CONTRAST:  75 ML OMNIPAQUE 300 FLUOROSCOPY TIME:  Radiation Exposure Index (as provided by the fluoroscopic device): 288 mGy Kerma COMPLICATIONS: None immediate. PROCEDURE: Informed consent was obtained from the patient following explanation of the procedure, risks, benefits and alternatives. The patient understands, agrees and consents for the procedure. All questions were addressed. A time out was performed prior to the initiation of the procedure. Maximal barrier sterile technique utilized including caps, mask, sterile gowns, sterile gloves, large sterile  drape, hand hygiene, and chlorhexidine prep. A time-out was performed prior to initiating the procedure. Ultrasound was used to confirm patency of the right common femoral artery. An ultrasound image was saved and recorded. Local anesthesia was provided with 1% lidocaine. Access of the right common femoral artery was performed under direct ultrasound guidance with a micropuncture set. A 5 French sheath was placed over a guidewire. A 5 [place] Kumpe the catheter was advanced into the right iliac artery. Contrast was injected and the catheter further advanced into the abdominal aorta over a hydrophilic guidewire. A 6 French, 45 cm sheath was then advanced into the abdominal aorta over a guidewire. The celiac axis was selectively catheterized with a 5 [place] Cobra catheter. Selective arteriography was performed. The gastroduodenal artery was then selectively catheterized with the 5 [place] Cobra catheter. Selective arteriography was performed of the gastroduodenal artery. A Medtronic MVP-5Q microvascular plug was then advanced through the 5 French catheter and unsheathed at the level of the gastroduodenal artery. The plug was then deployed. Repeat arteriography was then performed through the 5 French catheter after waiting a few minutes. The 5 French catheter was removed. The 6 French sheath was retracted into the pelvis and oblique arteriography performed at the level of the right femoral access site. Arteriotomy closure was performed with the Angio-Seal device. FINDINGS: Celiac arteriography demonstrates tortuosity of the celiac bifurcation with patent splenic, common hepatic and left gastric arteries demonstrated. The gastroduodenal artery is visualized and terminates in the gastroepiploic artery. The gastroduodenal artery was selectively catheterized with arteriography demonstrating normal patency. No pseudoaneurysm or arterial contrast extravasation identified. The gastroduodenal artery was successfully occluded  after deployment of a single microvascular plug resulting in complete occlusion of the artery. 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 Electronically Signed   By: Irish Lack M.D.   On: 02/05/2024 14:11   IR EMBO ART  VEN HEMORR LYMPH EXTRAV  INC GUIDE ROADMAPPING Result Date: 02/05/2024 INDICATION: Persistent bleeding from duodenal bulb ulcer after prior endoscopic intervention 3 days ago. EXAM: 1. ULTRASOUND GUIDANCE FOR VASCULAR ACCESS OF THE RIGHT COMMON FEMORAL ARTERY 2.  SELECTIVE ARTERIOGRAPHY OF THE CELIAC AXIS 3. SELECTIVE ARTERIOGRAPHY OF THE GASTRODUODENAL ARTERY 4. TRANSCATHETER EMBOLIZATION OF THE GASTRODUODENAL ARTERY TO TREAT ARTERIAL HEMORRHAGE MEDICATIONS: 2 G IV ANCEF. The antibiotic was administered within 1 hour of the procedure ANESTHESIA/SEDATION: Moderate (conscious) sedation was employed during this procedure. A total of Versed 4.0 mg was administered intravenously. Moderate Sedation Time: 65 minutes. The patient's level of consciousness and vital signs were monitored continuously by radiology nursing throughout the procedure under my direct supervision. CONTRAST:  75 ML OMNIPAQUE 300 FLUOROSCOPY TIME:  Radiation Exposure Index (as provided by the fluoroscopic device): 288 mGy Kerma COMPLICATIONS: None immediate. PROCEDURE: Informed consent was obtained from the patient following explanation of the procedure, risks, benefits and alternatives. The patient understands, agrees and consents for the procedure. All questions were addressed. A time out was performed prior to the initiation of the procedure. Maximal barrier sterile technique utilized including caps, mask, sterile gowns, sterile gloves, large sterile drape, hand hygiene, and chlorhexidine prep. A time-out was performed prior to initiating the procedure. Ultrasound was used to confirm patency of the  right common femoral artery. An ultrasound image was saved and recorded. Local anesthesia was provided with 1% lidocaine. Access of the right common femoral artery was performed under direct ultrasound guidance with a micropuncture set. A 5 French sheath was placed over a guidewire. A 5 [place] Kumpe the catheter was advanced into the right iliac artery. Contrast was injected and the catheter further advanced into the abdominal aorta over a hydrophilic guidewire. A 6 French, 45 cm sheath was then advanced into the abdominal aorta over a guidewire. The celiac axis was selectively catheterized with a 5 [place] Cobra catheter. Selective arteriography was performed. The gastroduodenal artery was then selectively catheterized with the 5 [place] Cobra catheter. Selective arteriography was performed of the gastroduodenal artery. A Medtronic MVP-5Q microvascular plug was then advanced through the 5 French catheter and unsheathed at the level of the gastroduodenal artery. The plug was then deployed. Repeat arteriography was then performed through the 5 French catheter after waiting a few minutes. The 5 French catheter was removed. The 6 French sheath was retracted into the pelvis and oblique arteriography performed at the level of the right femoral access site. Arteriotomy closure was performed with the Angio-Seal device. FINDINGS: Celiac arteriography demonstrates tortuosity of the celiac bifurcation with patent splenic, common hepatic and left gastric arteries demonstrated. The gastroduodenal artery is visualized and terminates in the gastroepiploic artery. The gastroduodenal artery was selectively catheterized with arteriography demonstrating normal patency. No pseudoaneurysm or arterial contrast extravasation identified. The gastroduodenal artery was successfully occluded after deployment of a single microvascular plug resulting in complete occlusion of the artery. IMPRESSION: Occlusion of gastroduodenal artery by  deployment of a transcatheter microvascular plug in order to treat persistent bleeding from an ulcer of the duodenal bulb. Electronically Signed   By: Irish Lack M.D.   On: 02/05/2024 14:11   IR Angiogram Selective Each Additional Vessel Result Date: 02/05/2024 INDICATION: Persistent bleeding from duodenal bulb ulcer after prior endoscopic intervention 3 days ago. EXAM: 1. ULTRASOUND GUIDANCE FOR VASCULAR ACCESS OF THE RIGHT COMMON FEMORAL ARTERY 2. SELECTIVE ARTERIOGRAPHY OF THE CELIAC AXIS 3. SELECTIVE ARTERIOGRAPHY OF THE GASTRODUODENAL ARTERY 4. TRANSCATHETER EMBOLIZATION OF THE GASTRODUODENAL ARTERY TO TREAT ARTERIAL HEMORRHAGE MEDICATIONS: 2 G IV ANCEF. The antibiotic was administered within 1 hour of the procedure ANESTHESIA/SEDATION: Moderate (conscious) sedation was employed during this procedure. A total of Versed 4.0 mg was administered intravenously. Moderate Sedation Time: 65 minutes.  The patient's level of consciousness and vital signs were monitored continuously by radiology nursing throughout the procedure under my direct supervision. CONTRAST:  75 ML OMNIPAQUE 300 FLUOROSCOPY TIME:  Radiation Exposure Index (as provided by the fluoroscopic device): 288 mGy Kerma COMPLICATIONS: None immediate. PROCEDURE: Informed consent was obtained from the patient following explanation of the procedure, risks, benefits and alternatives. The patient understands, agrees and consents for the procedure. All questions were addressed. A time out was performed prior to the initiation of the procedure. Maximal barrier sterile technique utilized including caps, mask, sterile gowns, sterile gloves, large sterile drape, hand hygiene, and chlorhexidine prep. A time-out was performed prior to initiating the procedure. Ultrasound was used to confirm patency of the right common femoral artery. An ultrasound image was saved and recorded. Local anesthesia was provided with 1% lidocaine. Access of the right common femoral  artery was performed under direct ultrasound guidance with a micropuncture set. A 5 French sheath was placed over a guidewire. A 5 [place] Kumpe the catheter was advanced into the right iliac artery. Contrast was injected and the catheter further advanced into the abdominal aorta over a hydrophilic guidewire. A 6 French, 45 cm sheath was then advanced into the abdominal aorta over a guidewire. The celiac axis was selectively catheterized with a 5 [place] Cobra catheter. Selective arteriography was performed. The gastroduodenal artery was then selectively catheterized with the 5 [place] Cobra catheter. Selective arteriography was performed of the gastroduodenal artery. A Medtronic MVP-5Q microvascular plug was then advanced through the 5 French catheter and unsheathed at the level of the gastroduodenal artery. The plug was then deployed. Repeat arteriography was then performed through the 5 French catheter after waiting a few minutes. The 5 French catheter was removed. The 6 French sheath was retracted into the pelvis and oblique arteriography performed at the level of the right femoral access site. Arteriotomy closure was performed with the Angio-Seal device. FINDINGS: Celiac arteriography demonstrates tortuosity of the celiac bifurcation with patent splenic, common hepatic and left gastric arteries demonstrated. The gastroduodenal artery is visualized and terminates in the gastroepiploic artery. The gastroduodenal artery was selectively catheterized with arteriography demonstrating normal patency. No pseudoaneurysm or arterial contrast extravasation identified. The gastroduodenal artery was successfully occluded after deployment of a single microvascular plug resulting in complete occlusion of the artery. 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 Electronically Signed   By: Irish Lack  M.D.   On: 02/05/2024 14:11   IR US Guide Vasc Access Right Result Date: 02/05/2024 INDICATION: Persistent bleeding from duodenal bulb ulcer after prior endoscopic intervention 3 days ago. EXAM: 1. ULTRASOUND GUIDANCE FOR VASCULAR ACCESS OF THE RIGHT COMMON FEMORAL ARTERY 2. SELECTIVE ARTERIOGRAPHY OF THE CELIAC AXIS 3. SELECTIVE ARTERIOGRAPHY OF THE GASTRODUODENAL ARTERY 4. TRANSCATHETER EMBOLIZATION OF THE GASTRODUODENAL ARTERY TO TREAT ARTERIAL HEMORRHAGE MEDICATIONS: 2 G IV ANCEF. The antibiotic was administered within 1 hour of the procedure ANESTHESIA/SEDATION: Moderate (conscious) sedation was employed during this procedure. A total of Versed 4.0 mg was administered intravenously. Moderate Sedation Time: 65 minutes. The patient's level of consciousness and vital signs were monitored continuously by radiology nursing throughout the procedure under my direct supervision. CONTRAST:  75 ML OMNIPAQUE 300 FLUOROSCOPY TIME:  Radiation Exposure Index (as provided by the fluoroscopic device): 288 mGy Kerma COMPLICATIONS: None immediate. PROCEDURE: Informed consent was obtained from the patient following explanation of the procedure, risks, benefits and alternatives.  The patient understands, agrees and consents for the procedure. All questions were addressed. A time out was performed prior to the initiation of the procedure. Maximal barrier sterile technique utilized including caps, mask, sterile gowns, sterile gloves, large sterile drape, hand hygiene, and chlorhexidine prep. A time-out was performed prior to initiating the procedure. Ultrasound was used to confirm patency of the right common femoral artery. An ultrasound image was saved and recorded. Local anesthesia was provided with 1% lidocaine. Access of the right common femoral artery was performed under direct ultrasound guidance with a micropuncture set. A 5 French sheath was placed over a guidewire. A 5 [place] Kumpe the catheter was advanced into the right  iliac artery. Contrast was injected and the catheter further advanced into the abdominal aorta over a hydrophilic guidewire. A 6 French, 45 cm sheath was then advanced into the abdominal aorta over a guidewire. The celiac axis was selectively catheterized with a 5 [place] Cobra catheter. Selective arteriography was performed. The gastroduodenal artery was then selectively catheterized with the 5 [place] Cobra catheter. Selective arteriography was performed of the gastroduodenal artery. A Medtronic MVP-5Q microvascular plug was then advanced through the 5 French catheter and unsheathed at the level of the gastroduodenal artery. The plug was then deployed. Repeat arteriography was then performed through the 5 French catheter after waiting a few minutes. The 5 French catheter was removed. The 6 French sheath was retracted into the pelvis and oblique arteriography performed at the level of the right femoral access site. Arteriotomy closure was performed with the Angio-Seal device. FINDINGS: Celiac arteriography demonstrates tortuosity of the celiac bifurcation with patent splenic, common hepatic and left gastric arteries demonstrated. The gastroduodenal artery is visualized and terminates in the gastroepiploic artery. The gastroduodenal artery was selectively catheterized with arteriography demonstrating normal patency. No pseudoaneurysm or arterial contrast extravasation identified. The gastroduodenal artery was successfully occluded after deployment of a single microvascular plug resulting in complete occlusion of the artery. 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 Electronically Signed   By: Irish Lack M.D.   On: 02/05/2024 14:11   ECHOCARDIOGRAM COMPLETE Result Date: 02/05/2024    ECHOCARDIOGRAM REPORT   Patient Name:   NILE PRISK Date of Exam: 02/05/2024 Medical Rec #:   [id number]     Height:       69.0 in Accession #:    [id number]    Weight:       175.0 lb Date of Birth:  15-Aug-1956     BSA:          1.952 m Patient Age:    67 years      BP:           121/75 mmHg Patient Gender: M             HR:           79 bpm. Exam Location:  Inpatient Procedure: 2D Echo, Cardiac Doppler and Color Doppler (Both Spectral and Color            Flow Doppler were utilized during procedure). Indications:    Acute Myocardial Infarction  History:        Patient has prior history of Echocardiogram examinations, most                 recent 02/03/2017. CAD and Previous Myocardial Infarction, COPD;  Risk Factors:Hypertension.  Sonographer:    Maxwell Marion Referring Phys: [id number] Karl Ito IMPRESSIONS  1. Left ventricular ejection fraction, by estimation, is 60 to 65%. The left ventricle has normal function. The left ventricle demonstrates regional wall motion abnormalities (see scoring diagram/findings for description). Left ventricular diastolic parameters are indeterminate.  2. Right ventricular systolic function is normal. The right ventricular size is normal.  3. The mitral valve is normal in structure. Mild to moderate mitral valve regurgitation. No evidence of mitral stenosis.  4. The aortic valve has an indeterminant number of cusps. Aortic valve regurgitation is trivial. Aortic valve sclerosis/calcification is present, without any evidence of aortic stenosis.  5. The inferior vena cava is normal in size with greater than 50% respiratory variability, suggesting right atrial pressure of 3 mmHg. FINDINGS  Left Ventricle: Left ventricular ejection fraction, by estimation, is 60 to 65%. The left ventricle has normal function. The left ventricle demonstrates regional wall motion abnormalities. The left ventricular internal cavity size was normal in size. There is no left ventricular hypertrophy. Left ventricular diastolic parameters are indeterminate.  LV Wall Scoring: The inferior wall  is hypokinetic. The entire anterior wall, entire lateral wall, entire septum, and entire apex are normal. Right Ventricle: The right ventricular size is normal. No increase in right ventricular wall thickness. Right ventricular systolic function is normal. Left Atrium: Left atrial size was normal in size. Right Atrium: Right atrial size was normal in size. Pericardium: There is no evidence of pericardial effusion. Mitral Valve: The mitral valve is normal in structure. Mild to moderate mitral valve regurgitation. No evidence of mitral valve stenosis. Tricuspid Valve: The tricuspid valve is normal in structure. Tricuspid valve regurgitation is not demonstrated. No evidence of tricuspid stenosis. Aortic Valve: The aortic valve has an indeterminant number of cusps. Aortic valve regurgitation is trivial. Aortic valve sclerosis/calcification is present, without any evidence of aortic stenosis. Aortic valve mean gradient measures 4.0 mmHg. Aortic valve peak gradient measures 10.5 mmHg. Aortic valve area, by VTI measures 2.77 cm. Pulmonic Valve: The pulmonic valve was normal in structure. Pulmonic valve regurgitation is not visualized. No evidence of pulmonic stenosis. Aorta: The aortic root is normal in size and structure. Venous: The inferior vena cava is normal in size with greater than 50% respiratory variability, suggesting right atrial pressure of 3 mmHg. IAS/Shunts: No atrial level shunt detected by color flow Doppler.  LEFT VENTRICLE PLAX 2D LVIDd:         4.60 cm      Diastology LVIDs:         3.40 cm      LV e' medial:    7.15 cm/s LV PW:         0.90 cm      LV E/e' medial:  10.3 LV IVS:        0.80 cm      LV e' lateral:   16.50 cm/s LVOT diam:     2.10 cm      LV E/e' lateral: 4.5 LV SV:         66 LV SV Index:   34 LVOT Area:     3.46 cm  LV Volumes (MOD) LV vol d, MOD A2C: 120.0 ml LV vol d, MOD A4C: 110.0 ml LV vol s, MOD A2C: 45.9 ml LV vol s, MOD A4C: 31.4 ml LV SV MOD A2C:     74.1 ml LV SV MOD A4C:      110.0 ml LV SV MOD BP:  75.5 ml RIGHT VENTRICLE RV S prime:     14.80 cm/s TAPSE (M-mode): 1.8 cm LEFT ATRIUM             Index LA diam:        3.40 cm 1.74 cm/m LA Vol (A2C):   38.5 ml 19.72 ml/m LA Vol (A4C):   27.6 ml 14.14 ml/m LA Biplane Vol: 35.4 ml 18.13 ml/m  AORTIC VALVE AV Area (Vmax):    2.97 cm AV Area (Vmean):   2.73 cm AV Area (VTI):     2.77 cm AV Vmax:           162.00 cm/s AV Vmean:          92.200 cm/s AV VTI:            0.239 m AV Peak Grad:      10.5 mmHg AV Mean Grad:      4.0 mmHg LVOT Vmax:         139.00 cm/s LVOT Vmean:        72.600 cm/s LVOT VTI:          0.191 m LVOT/AV VTI ratio: 0.80 MITRAL VALVE MV Area (PHT): 2.31 cm    SHUNTS MV Decel Time: 328 msec    Systemic VTI:  0.19 m MR Peak grad: 125.4 mmHg   Systemic Diam: 2.10 cm MR Vmax:      560.00 cm/s MV E velocity: 73.70 cm/s MV A velocity: 66.00 cm/s MV E/A ratio:  1.12 Kardie Tobb DO Electronically signed by Thomasene Ripple DO Signature Date/Time: 02/05/2024/11:25:16 AM    Final    CT ANGIO GI BLEED Result Date: 02/05/2024 CLINICAL DATA:  Rectal bleeding. Known duodenal ulcer. Acute mesenteric ischemia. EXAM: CTA ABDOMEN AND PELVIS WITHOUT AND WITH CONTRAST TECHNIQUE: Multidetector CT imaging of the abdomen and pelvis was performed using the standard protocol during bolus administration of intravenous contrast. Multiplanar reconstructed images and MIPs were obtained and reviewed to evaluate the vascular anatomy. RADIATION DOSE REDUCTION: This exam was performed according to the departmental dose-optimization program which includes automated exposure control, adjustment of the mA and/or kV according to patient size and/or use of iterative reconstruction technique. CONTRAST:  75mL OMNIPAQUE IOHEXOL 350 MG/ML SOLN COMPARISON:  02/02/2024 FINDINGS: VASCULAR Aorta: Extensive aortic atherosclerosis. Again seen are postsurgical changes from infrarenal abdominal aortic aneurysm repair. Maximum caliber of the abdominal aorta is 3.5  cm, image 119/10. Unchanged from previous exam. No signs of acute dissection. No periaortic fluid or fat stranding. Celiac: Patent without evidence of aneurysm, dissection, vasculitis or significant stenosis. SMA: Patent without evidence of aneurysm, dissection, vasculitis or significant stenosis. Renals: Both renal arteries are patent without evidence of aneurysm, dissection, vasculitis, fibromuscular dysplasia or significant stenosis. IMA: Not visualized status post aortic repair. 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 On the right this measures 2.6 cm and on the left this measures 2.9 cm. Proximal Outflow: Bilateral common femoral and visualized portions of the superficial and profunda femoral arteries are patent without evidence of aneurysm, dissection, vasculitis or significant stenosis. Veins: No obvious venous abnormality within the limitations of this arterial phase study. Review of the MIP images confirms the above findings. NON-VASCULAR Lower chest: No acute abnormality. Hepatobiliary: No focal liver abnormality is seen. No gallstones, gallbladder wall thickening, or biliary dilatation. Pancreas: Unremarkable. No pancreatic ductal dilatation or surrounding inflammatory changes. Spleen: Normal in size without focal abnormality. Adrenals/Urinary Tract: Normal adrenal glands. No kidney mass or obstructive uropathy. 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 Urinary bladder appears normal. Stomach/Bowel: Stomach appears normal. Known descending duodenal ulceration is again noted. This measures 2.8 x 2.6 cm, image 85/10. There is surrounding soft tissue stranding as noted previously. On the precontrast images there is high density material identified within the pylorus and descending duodenum. This appears unchanged between the pre and postcontrast imaging without signs of active extravasation of IV contrast material within the lumen of  the duodenum. Several foci of high attenuation material within the small bowel loops also noted which remains unchanged between the pre and postcontrast images. No progressive areas of intraluminal contrast material identified to suggest active GI bleeding. No pathologic dilatation of the large or small bowel loops. No pneumatosis identified. No signs of bowel ischemia. Lymphatic: No signs of abdominopelvic adenopathy. Reproductive: Prostate is unremarkable. Other: No significant free fluid or fluid collections. No pneumoperitoneum. Musculoskeletal: Previous ORIF of the left proximal femur. Lumbar degenerative disc disease. No acute or suspicious osseous findings. IMPRESSION: 1. No signs of active GI bleeding. 2. Known descending duodenal ulcer with surrounding inflammatory changes. This measures 2.8 x 2.6 cm. 3. No signs of bowel obstruction or ischemia. 4. Stable appearance of infrarenal abdominal aortic aneurysm repair. Maximum caliber of the abdominal aorta is 3.5 cm. 5. 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 Electronically Signed   By: Signa Kell M.D.   On: 02/05/2024 07:07   DG ABD ACUTE 2+V W 1V CHEST Result Date: 02/05/2024 CLINICAL DATA:  Abdomen pain EXAM: DG ABDOMEN ACUTE WITH 1 VIEW CHEST COMPARISON:  02/24/2022 FINDINGS: Single-view chest demonstrates no acute airspace disease or effusion. Normal cardiac size. No pneumothorax. Supine and upright views of the abdomen demonstrate a nonobstructed gas pattern. No free air beneath the diaphragm. IMPRESSION: No active disease. Nonobstructed gas pattern. Electronically Signed   By: Jasmine Pang M.D.   On: 02/05/2024 02:52        Scheduled Meds:  sodium chloride   Intravenous Once   sodium chloride   Intravenous Once   atorvastatin  80 mg Oral Daily   feeding supplement  237 mL Oral BID BM   multivitamin with minerals  1 tablet Oral Daily   nicotine  21 mg Transdermal Daily   pantoprazole (PROTONIX) IV  40 mg  Intravenous TID   sodium chloride flush  3 mL Intravenous Q12H   sucralfate  1 g Oral TID WC & HS   Continuous Infusions:   LOS: 3 days    Time spent: 45 minutes    Ramiro Harvest, MD [facility]   To contact the attending provider between 7A-7P or the covering provider during after hours 7P-7A, please log into the web site [email or web address] and access using universal [place] password for that web site. If you do not have the password, please call the hospital operator.  02/06/2024, 6:57 PM

## 2024-02-06 NOTE — Care Management Important Message (Signed)
 Important Message  Patient Details  Name: Anthony Coffey MRN: [id number] Date of Birth: 1956-03-25   Important Message Given:  Yes - Medicare IM     Dorena Bodo 02/06/2024, 4:07 PM

## 2024-02-06 NOTE — Progress Notes (Addendum)
 4 Days Post-Op  Subjective: CC: S/p IR GDA embolization yesterday.  No abdominal pain this am. Tolerating soft diet without n/v. Still having bloody stools with the last this am.   Objective: Vital signs in last 24 hours: Temp:  [97.8 F (36.6 C)-99.3 F (37.4 C)] 98.7 F (37.1 C) (04/11 0725) Pulse Rate:  [73-98] 73 (04/11 0725) Resp:  [13-24] 14 (04/11 0725) BP: (94-136)/(62-82) 121/69 (04/11 0725) SpO2:  [97 %-100 %] 98 % (04/11 0725) Last BM Date : 02/05/24  Intake/Output from previous day: 04/10 0701 - 04/11 0700 In: 795 [P.O.:495; Blood:300] Out: 0  Intake/Output this shift: No intake/output data recorded.  PE: Gen:  Alert, NAD, pleasant Abd: Soft, ND, NT, +BS  Lab Results:  Recent Labs    02/04/24 2333 02/05/24 0836 02/05/24 1015 02/06/24 0356  WBC 10.3 9.4  --   --   HGB 8.1* 9.2* 8.3* 7.5*  HCT 23.6* 26.9* 24.1* 22.3*  PLT 408* 430*  --   --    BMET Recent Labs    02/05/24 0836 02/06/24 0356  NA 140 136  K 3.4* 3.6  CL 106 108  CO2 23 21*  GLUCOSE 105* 108*  BUN <5* 5*  CREATININE 0.71 0.66  CALCIUM 8.2* 8.3*   PT/INR No results for input(s): "LABPROT", "INR" in the last 72 hours. CMP     Component Value Date/Time   NA 136 02/06/2024 0356   NA 136 09/28/2019 0844   K 3.6 02/06/2024 0356   CL 108 02/06/2024 0356   CO2 21 (L) 02/06/2024 0356   GLUCOSE 108 (H) 02/06/2024 0356   BUN 5 (L) 02/06/2024 0356   BUN 10 09/28/2019 0844   CREATININE 0.66 02/06/2024 0356   CALCIUM 8.3 (L) 02/06/2024 0356   PROT 5.5 (L) 02/02/2024 0636   PROT 6.7 01/03/2020 0750   ALBUMIN 2.8 (L) 02/02/2024 0636   ALBUMIN 4.5 01/03/2020 0750   AST 30 02/02/2024 0636   ALT 15 02/02/2024 0636   ALKPHOS 55 02/02/2024 0636   BILITOT 0.3 02/02/2024 0636   BILITOT <0.2 01/03/2020 0750   GFRNONAA >60 02/06/2024 0356   GFRAA >60 01/22/2020 1029   Lipase     Component Value Date/Time   LIPASE 53 (H) 02/12/2023 0907    Studies/Results: IR Angiogram  Visceral Selective Result Date: 02/05/2024 INDICATION: Persistent bleeding from duodenal bulb ulcer after prior endoscopic intervention 3 days ago. EXAM: 1. ULTRASOUND GUIDANCE FOR VASCULAR ACCESS OF THE RIGHT COMMON FEMORAL ARTERY 2. SELECTIVE ARTERIOGRAPHY OF THE CELIAC AXIS 3. SELECTIVE ARTERIOGRAPHY OF THE GASTRODUODENAL ARTERY 4. TRANSCATHETER EMBOLIZATION OF THE GASTRODUODENAL ARTERY TO TREAT ARTERIAL HEMORRHAGE MEDICATIONS: 2 G IV ANCEF. The antibiotic was administered within 1 hour of the procedure ANESTHESIA/SEDATION: Moderate (conscious) sedation was employed during this procedure. A total of Versed 4.0 mg was administered intravenously. Moderate Sedation Time: 65 minutes. The patient's level of consciousness and vital signs were monitored continuously by radiology nursing throughout the procedure under my direct supervision. CONTRAST:  75 ML OMNIPAQUE 300 FLUOROSCOPY TIME:  Radiation Exposure Index (as provided by the fluoroscopic device): 288 mGy Kerma COMPLICATIONS: None immediate. PROCEDURE: Informed consent was obtained from the patient following explanation of the procedure, risks, benefits and alternatives. The patient understands, agrees and consents for the procedure. All questions were addressed. A time out was performed prior to the initiation of the procedure. Maximal barrier sterile technique utilized including caps, mask, sterile gowns, sterile gloves, large sterile drape, hand hygiene, and chlorhexidine prep. A time-out  was performed prior to initiating the procedure. Ultrasound was used to confirm patency of the right common femoral artery. An ultrasound image was saved and recorded. Local anesthesia was provided with 1% lidocaine. Access of the right common femoral artery was performed under direct ultrasound guidance with a micropuncture set. A 5 French sheath was placed over a guidewire. A 5 [place] Kumpe the catheter was advanced into the right iliac artery. Contrast was injected and  the catheter further advanced into the abdominal aorta over a hydrophilic guidewire. A 6 French, 45 cm sheath was then advanced into the abdominal aorta over a guidewire. The celiac axis was selectively catheterized with a 5 [place] Cobra catheter. Selective arteriography was performed. The gastroduodenal artery was then selectively catheterized with the 5 [place] Cobra catheter. Selective arteriography was performed of the gastroduodenal artery. A Medtronic MVP-5Q microvascular plug was then advanced through the 5 French catheter and unsheathed at the level of the gastroduodenal artery. The plug was then deployed. Repeat arteriography was then performed through the 5 French catheter after waiting a few minutes. The 5 French catheter was removed. The 6 French sheath was retracted into the pelvis and oblique arteriography performed at the level of the right femoral access site. Arteriotomy closure was performed with the Angio-Seal device. FINDINGS: Celiac arteriography demonstrates tortuosity of the celiac bifurcation with patent splenic, common hepatic and left gastric arteries demonstrated. The gastroduodenal artery is visualized and terminates in the gastroepiploic artery. The gastroduodenal artery was selectively catheterized with arteriography demonstrating normal patency. No pseudoaneurysm or arterial contrast extravasation identified. The gastroduodenal artery was successfully occluded after deployment of a single microvascular plug resulting in complete occlusion of the artery. 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 Electronically Signed   By: Irish Lack M.D.   On: 02/05/2024 14:11   IR EMBO ART  VEN HEMORR LYMPH EXTRAV  INC GUIDE ROADMAPPING Result Date: 02/05/2024 INDICATION: Persistent bleeding from duodenal bulb ulcer after prior endoscopic intervention 3 days ago.  EXAM: 1. ULTRASOUND GUIDANCE FOR VASCULAR ACCESS OF THE RIGHT COMMON FEMORAL ARTERY 2. SELECTIVE ARTERIOGRAPHY OF THE CELIAC AXIS 3. SELECTIVE ARTERIOGRAPHY OF THE GASTRODUODENAL ARTERY 4. TRANSCATHETER EMBOLIZATION OF THE GASTRODUODENAL ARTERY TO TREAT ARTERIAL HEMORRHAGE MEDICATIONS: 2 G IV ANCEF. The antibiotic was administered within 1 hour of the procedure ANESTHESIA/SEDATION: Moderate (conscious) sedation was employed during this procedure. A total of Versed 4.0 mg was administered intravenously. Moderate Sedation Time: 65 minutes. The patient's level of consciousness and vital signs were monitored continuously by radiology nursing throughout the procedure under my direct supervision. CONTRAST:  75 ML OMNIPAQUE 300 FLUOROSCOPY TIME:  Radiation Exposure Index (as provided by the fluoroscopic device): 288 mGy Kerma COMPLICATIONS: None immediate. PROCEDURE: Informed consent was obtained from the patient following explanation of the procedure, risks, benefits and alternatives. The patient understands, agrees and consents for the procedure. All questions were addressed. A time out was performed prior to the initiation of the procedure. Maximal barrier sterile technique utilized including caps, mask, sterile gowns, sterile gloves, large sterile drape, hand hygiene, and chlorhexidine prep. A time-out was performed prior to initiating the procedure. Ultrasound was used to confirm patency of the right common femoral artery. An ultrasound image was saved and recorded. Local anesthesia was provided with 1% lidocaine. Access of the right common femoral artery was performed under direct ultrasound guidance with a micropuncture set. A 5 French sheath was placed  over a guidewire. A 5 [place] Kumpe the catheter was advanced into the right iliac artery. Contrast was injected and the catheter further advanced into the abdominal aorta over a hydrophilic guidewire. A 6 French, 45 cm sheath was then advanced into the abdominal  aorta over a guidewire. The celiac axis was selectively catheterized with a 5 [place] Cobra catheter. Selective arteriography was performed. The gastroduodenal artery was then selectively catheterized with the 5 [place] Cobra catheter. Selective arteriography was performed of the gastroduodenal artery. A Medtronic MVP-5Q microvascular plug was then advanced through the 5 French catheter and unsheathed at the level of the gastroduodenal artery. The plug was then deployed. Repeat arteriography was then performed through the 5 French catheter after waiting a few minutes. The 5 French catheter was removed. The 6 French sheath was retracted into the pelvis and oblique arteriography performed at the level of the right femoral access site. Arteriotomy closure was performed with the Angio-Seal device. FINDINGS: Celiac arteriography demonstrates tortuosity of the celiac bifurcation with patent splenic, common hepatic and left gastric arteries demonstrated. The gastroduodenal artery is visualized and terminates in the gastroepiploic artery. The gastroduodenal artery was selectively catheterized with arteriography demonstrating normal patency. No pseudoaneurysm or arterial contrast extravasation identified. The gastroduodenal artery was successfully occluded after deployment of a single microvascular plug resulting in complete occlusion of the artery. 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 Electronically Signed   By: Irish Lack M.D.   On: 02/05/2024 14:11   IR Angiogram Selective Each Additional Vessel Result Date: 02/05/2024 INDICATION: Persistent bleeding from duodenal bulb ulcer after prior endoscopic intervention 3 days ago. EXAM: 1. ULTRASOUND GUIDANCE FOR VASCULAR ACCESS OF THE RIGHT COMMON FEMORAL ARTERY 2. SELECTIVE ARTERIOGRAPHY OF THE CELIAC AXIS 3. SELECTIVE ARTERIOGRAPHY OF THE  GASTRODUODENAL ARTERY 4. TRANSCATHETER EMBOLIZATION OF THE GASTRODUODENAL ARTERY TO TREAT ARTERIAL HEMORRHAGE MEDICATIONS: 2 G IV ANCEF. The antibiotic was administered within 1 hour of the procedure ANESTHESIA/SEDATION: Moderate (conscious) sedation was employed during this procedure. A total of Versed 4.0 mg was administered intravenously. Moderate Sedation Time: 65 minutes. The patient's level of consciousness and vital signs were monitored continuously by radiology nursing throughout the procedure under my direct supervision. CONTRAST:  75 ML OMNIPAQUE 300 FLUOROSCOPY TIME:  Radiation Exposure Index (as provided by the fluoroscopic device): 288 mGy Kerma COMPLICATIONS: None immediate. PROCEDURE: Informed consent was obtained from the patient following explanation of the procedure, risks, benefits and alternatives. The patient understands, agrees and consents for the procedure. All questions were addressed. A time out was performed prior to the initiation of the procedure. Maximal barrier sterile technique utilized including caps, mask, sterile gowns, sterile gloves, large sterile drape, hand hygiene, and chlorhexidine prep. A time-out was performed prior to initiating the procedure. Ultrasound was used to confirm patency of the right common femoral artery. An ultrasound image was saved and recorded. Local anesthesia was provided with 1% lidocaine. Access of the right common femoral artery was performed under direct ultrasound guidance with a micropuncture set. A 5 French sheath was placed over a guidewire. A 5 [place] Kumpe the catheter was advanced into the right iliac artery. Contrast was injected and the catheter further advanced into the abdominal aorta over a hydrophilic guidewire. A 6 French, 45 cm sheath was then advanced into the abdominal aorta over a guidewire. The celiac axis was selectively catheterized with a 5 [place] Cobra catheter. Selective arteriography  was performed. The gastroduodenal artery  was then selectively catheterized with the 5 [place] Cobra catheter. Selective arteriography was performed of the gastroduodenal artery. A Medtronic MVP-5Q microvascular plug was then advanced through the 5 French catheter and unsheathed at the level of the gastroduodenal artery. The plug was then deployed. Repeat arteriography was then performed through the 5 French catheter after waiting a few minutes. The 5 French catheter was removed. The 6 French sheath was retracted into the pelvis and oblique arteriography performed at the level of the right femoral access site. Arteriotomy closure was performed with the Angio-Seal device. FINDINGS: Celiac arteriography demonstrates tortuosity of the celiac bifurcation with patent splenic, common hepatic and left gastric arteries demonstrated. The gastroduodenal artery is visualized and terminates in the gastroepiploic artery. The gastroduodenal artery was selectively catheterized with arteriography demonstrating normal patency. No pseudoaneurysm or arterial contrast extravasation identified. The gastroduodenal artery was successfully occluded after deployment of a single microvascular plug resulting in complete occlusion of the artery. 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 Electronically Signed   By: Irish Lack M.D.   On: 02/05/2024 14:11   IR US Guide Vasc Access Right Result Date: 02/05/2024 INDICATION: Persistent bleeding from duodenal bulb ulcer after prior endoscopic intervention 3 days ago. EXAM: 1. ULTRASOUND GUIDANCE FOR VASCULAR ACCESS OF THE RIGHT COMMON FEMORAL ARTERY 2. SELECTIVE ARTERIOGRAPHY OF THE CELIAC AXIS 3. SELECTIVE ARTERIOGRAPHY OF THE GASTRODUODENAL ARTERY 4. TRANSCATHETER EMBOLIZATION OF THE GASTRODUODENAL ARTERY TO TREAT ARTERIAL HEMORRHAGE MEDICATIONS: 2 G IV ANCEF. The antibiotic was administered within 1  hour of the procedure ANESTHESIA/SEDATION: Moderate (conscious) sedation was employed during this procedure. A total of Versed 4.0 mg was administered intravenously. Moderate Sedation Time: 65 minutes. The patient's level of consciousness and vital signs were monitored continuously by radiology nursing throughout the procedure under my direct supervision. CONTRAST:  75 ML OMNIPAQUE 300 FLUOROSCOPY TIME:  Radiation Exposure Index (as provided by the fluoroscopic device): 288 mGy Kerma COMPLICATIONS: None immediate. PROCEDURE: Informed consent was obtained from the patient following explanation of the procedure, risks, benefits and alternatives. The patient understands, agrees and consents for the procedure. All questions were addressed. A time out was performed prior to the initiation of the procedure. Maximal barrier sterile technique utilized including caps, mask, sterile gowns, sterile gloves, large sterile drape, hand hygiene, and chlorhexidine prep. A time-out was performed prior to initiating the procedure. Ultrasound was used to confirm patency of the right common femoral artery. An ultrasound image was saved and recorded. Local anesthesia was provided with 1% lidocaine. Access of the right common femoral artery was performed under direct ultrasound guidance with a micropuncture set. A 5 French sheath was placed over a guidewire. A 5 [place] Kumpe the catheter was advanced into the right iliac artery. Contrast was injected and the catheter further advanced into the abdominal aorta over a hydrophilic guidewire. A 6 French, 45 cm sheath was then advanced into the abdominal aorta over a guidewire. The celiac axis was selectively catheterized with a 5 [place] Cobra catheter. Selective arteriography was performed. The gastroduodenal artery was then selectively catheterized with the 5 [place] Cobra catheter. Selective arteriography was performed of the gastroduodenal artery. A Medtronic MVP-5Q microvascular plug was  then advanced through the 5 French catheter and unsheathed at the level of the gastroduodenal artery. The plug was then deployed. Repeat arteriography was then performed through the 5 French catheter after  waiting a few minutes. The 5 French catheter was removed. The 6 French sheath was retracted into the pelvis and oblique arteriography performed at the level of the right femoral access site. Arteriotomy closure was performed with the Angio-Seal device. FINDINGS: Celiac arteriography demonstrates tortuosity of the celiac bifurcation with patent splenic, common hepatic and left gastric arteries demonstrated. The gastroduodenal artery is visualized and terminates in the gastroepiploic artery. The gastroduodenal artery was selectively catheterized with arteriography demonstrating normal patency. No pseudoaneurysm or arterial contrast extravasation identified. The gastroduodenal artery was successfully occluded after deployment of a single microvascular plug resulting in complete occlusion of the artery. 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 Electronically Signed   By: Irish Lack M.D.   On: 02/05/2024 14:11   ECHOCARDIOGRAM COMPLETE Result Date: 02/05/2024    ECHOCARDIOGRAM REPORT   Patient Name:   Anthony Coffey Date of Exam: 02/05/2024 Medical Rec #:  [id number]     Height:       69.0 in Accession #:    [id number]    Weight:       175.0 lb Date of Birth:  [birth year]     BSA:          1.952 m Patient Age:    67 years      BP:           121/75 mmHg Patient Gender: M             HR:           79 bpm. Exam Location:  Inpatient Procedure: 2D Echo, Cardiac Doppler and Color Doppler (Both Spectral and Color            Flow Doppler were utilized during procedure). Indications:    Acute Myocardial Infarction  History:        Patient has prior history of Echocardiogram examinations, most                  recent 02/03/2017. CAD and Previous Myocardial Infarction, COPD;                 Risk Factors:Hypertension.  Sonographer:    Maxwell Marion Referring Phys: [id number] Karl Ito IMPRESSIONS  1. Left ventricular ejection fraction, by estimation, is 60 to 65%. The left ventricle has normal function. The left ventricle demonstrates regional wall motion abnormalities (see scoring diagram/findings for description). Left ventricular diastolic parameters are indeterminate.  2. Right ventricular systolic function is normal. The right ventricular size is normal.  3. The mitral valve is normal in structure. Mild to moderate mitral valve regurgitation. No evidence of mitral stenosis.  4. The aortic valve has an indeterminant number of cusps. Aortic valve regurgitation is trivial. Aortic valve sclerosis/calcification is present, without any evidence of aortic stenosis.  5. The inferior vena cava is normal in size with greater than 50% respiratory variability, suggesting right atrial pressure of 3 mmHg. FINDINGS  Left Ventricle: Left ventricular ejection fraction, by estimation, is 60 to 65%. The left ventricle has normal function. The left ventricle demonstrates regional wall motion abnormalities. The left ventricular internal cavity size was normal in size. There is no left ventricular hypertrophy. Left ventricular diastolic parameters are indeterminate.  LV Wall Scoring: The inferior wall is hypokinetic. The entire anterior wall, entire lateral wall, entire septum, and entire apex are normal. Right Ventricle: The right ventricular size is normal. No increase in  right ventricular wall thickness. Right ventricular systolic function is normal. Left Atrium: Left atrial size was normal in size. Right Atrium: Right atrial size was normal in size. Pericardium: There is no evidence of pericardial effusion. Mitral Valve: The mitral valve is normal in structure. Mild to moderate mitral valve regurgitation. No evidence of  mitral valve stenosis. Tricuspid Valve: The tricuspid valve is normal in structure. Tricuspid valve regurgitation is not demonstrated. No evidence of tricuspid stenosis. Aortic Valve: The aortic valve has an indeterminant number of cusps. Aortic valve regurgitation is trivial. Aortic valve sclerosis/calcification is present, without any evidence of aortic stenosis. Aortic valve mean gradient measures 4.0 mmHg. Aortic valve peak gradient measures 10.5 mmHg. Aortic valve area, by VTI measures 2.77 cm. Pulmonic Valve: The pulmonic valve was normal in structure. Pulmonic valve regurgitation is not visualized. No evidence of pulmonic stenosis. Aorta: The aortic root is normal in size and structure. Venous: The inferior vena cava is normal in size with greater than 50% respiratory variability, suggesting right atrial pressure of 3 mmHg. IAS/Shunts: No atrial level shunt detected by color flow Doppler.  LEFT VENTRICLE PLAX 2D LVIDd:         4.60 cm      Diastology LVIDs:         3.40 cm      LV e' medial:    7.15 cm/s LV PW:         0.90 cm      LV E/e' medial:  10.3 LV IVS:        0.80 cm      LV e' lateral:   16.50 cm/s LVOT diam:     2.10 cm      LV E/e' lateral: 4.5 LV SV:         66 LV SV Index:   34 LVOT Area:     3.46 cm  LV Volumes (MOD) LV vol d, MOD A2C: 120.0 ml LV vol d, MOD A4C: 110.0 ml LV vol s, MOD A2C: 45.9 ml LV vol s, MOD A4C: 31.4 ml LV SV MOD A2C:     74.1 ml LV SV MOD A4C:     110.0 ml LV SV MOD BP:      75.5 ml RIGHT VENTRICLE RV S prime:     14.80 cm/s TAPSE (M-mode): 1.8 cm LEFT ATRIUM             Index LA diam:        3.40 cm 1.74 cm/m LA Vol (A2C):   38.5 ml 19.72 ml/m LA Vol (A4C):   27.6 ml 14.14 ml/m LA Biplane Vol: 35.4 ml 18.13 ml/m  AORTIC VALVE AV Area (Vmax):    2.97 cm AV Area (Vmean):   2.73 cm AV Area (VTI):     2.77 cm AV Vmax:           162.00 cm/s AV Vmean:          92.200 cm/s AV VTI:            0.239 m AV Peak Grad:      10.5 mmHg AV Mean Grad:      4.0 mmHg LVOT Vmax:          139.00 cm/s LVOT Vmean:        72.600 cm/s LVOT VTI:          0.191 m LVOT/AV VTI ratio: 0.80 MITRAL VALVE MV Area (PHT): 2.31 cm    SHUNTS MV Decel Time: 328 msec  Systemic VTI:  0.19 m MR Peak grad: 125.4 mmHg   Systemic Diam: 2.10 cm MR Vmax:      560.00 cm/s MV E velocity: 73.70 cm/s MV A velocity: 66.00 cm/s MV E/A ratio:  1.12 Kardie Tobb DO Electronically signed by Thomasene Ripple DO Signature Date/Time: 02/05/2024/11:25:16 AM    Final    CT ANGIO GI BLEED Result Date: 02/05/2024 CLINICAL DATA:  Rectal bleeding. Known duodenal ulcer. Acute mesenteric ischemia. EXAM: CTA ABDOMEN AND PELVIS WITHOUT AND WITH CONTRAST TECHNIQUE: Multidetector CT imaging of the abdomen and pelvis was performed using the standard protocol during bolus administration of intravenous contrast. Multiplanar reconstructed images and MIPs were obtained and reviewed to evaluate the vascular anatomy. RADIATION DOSE REDUCTION: This exam was performed according to the departmental dose-optimization program which includes automated exposure control, adjustment of the mA and/or kV according to patient size and/or use of iterative reconstruction technique. CONTRAST:  75mL OMNIPAQUE IOHEXOL 350 MG/ML SOLN COMPARISON:  02/02/2024 FINDINGS: VASCULAR Aorta: Extensive aortic atherosclerosis. Again seen are postsurgical changes from infrarenal abdominal aortic aneurysm repair. Maximum caliber of the abdominal aorta is 3.5 cm, image 119/10. Unchanged from previous exam. No signs of acute dissection. No periaortic fluid or fat stranding. Celiac: Patent without evidence of aneurysm, dissection, vasculitis or significant stenosis. SMA: Patent without evidence of aneurysm, dissection, vasculitis or significant stenosis. Renals: Both renal arteries are patent without evidence of aneurysm, dissection, vasculitis, fibromuscular dysplasia or significant stenosis. IMA: Not visualized status post aortic repair. 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 On the right this measures 2.6 cm and on the left this measures 2.9 cm. Proximal Outflow: Bilateral common femoral and visualized portions of the superficial and profunda femoral arteries are patent without evidence of aneurysm, dissection, vasculitis or significant stenosis. Veins: No obvious venous abnormality within the limitations of this arterial phase study. Review of the MIP images confirms the above findings. NON-VASCULAR Lower chest: No acute abnormality. Hepatobiliary: No focal liver abnormality is seen. No gallstones, gallbladder wall thickening, or biliary dilatation. Pancreas: Unremarkable. No pancreatic ductal dilatation or surrounding inflammatory changes. Spleen: Normal in size without focal abnormality. Adrenals/Urinary Tract: Normal adrenal glands. No kidney mass or obstructive uropathy. 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 Urinary bladder appears normal. Stomach/Bowel: Stomach appears normal. Known descending duodenal ulceration is again noted. This measures 2.8 x 2.6 cm, image 85/10. There is surrounding soft tissue stranding as noted previously. On the precontrast images there is high density material identified within the pylorus and descending duodenum. This appears unchanged between the pre and postcontrast imaging without signs of active extravasation of IV contrast material within the lumen of the duodenum. Several foci of high attenuation material within the small bowel loops also noted which remains unchanged between the pre and postcontrast images. No progressive areas of intraluminal contrast material identified to suggest active GI bleeding. No pathologic dilatation of the large or small bowel loops. No pneumatosis identified. No signs of bowel ischemia. Lymphatic: No signs of abdominopelvic adenopathy. Reproductive: Prostate is unremarkable. Other: No significant free fluid or fluid collections. No  pneumoperitoneum. Musculoskeletal: Previous ORIF of the left proximal femur. Lumbar degenerative disc disease. No acute or suspicious osseous findings. IMPRESSION: 1. No signs of active GI bleeding. 2. Known descending duodenal ulcer with surrounding inflammatory changes. This measures 2.8 x 2.6 cm. 3. No signs of bowel obstruction or ischemia. 4. Stable appearance of infrarenal abdominal aortic aneurysm repair. Maximum caliber of the abdominal  aorta is 3.5 cm. 5. 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 Electronically Signed   By: Signa Kell M.D.   On: 02/05/2024 07:07   DG ABD ACUTE 2+V W 1V CHEST Result Date: 02/05/2024 CLINICAL DATA:  Abdomen pain EXAM: DG ABDOMEN ACUTE WITH 1 VIEW CHEST COMPARISON:  02/24/2022 FINDINGS: Single-view chest demonstrates no acute airspace disease or effusion. Normal cardiac size. No pneumothorax. Supine and upright views of the abdomen demonstrate a nonobstructed gas pattern. No free air beneath the diaphragm. IMPRESSION: No active disease. Nonobstructed gas pattern. Electronically Signed   By: Jasmine Pang M.D.   On: 02/05/2024 02:52    Anti-infectives: Anti-infectives (From admission, onward)    Start     Dose/Rate Route Frequency Ordered Stop   02/05/24 1147  ceFAZolin (ANCEF) IVPB 2g/100 mL premix        over 30 Minutes Intravenous Continuous PRN 02/05/24 1158 02/05/24 1147   02/02/24 1230  Ampicillin-Sulbactam (UNASYN) 3 g in sodium chloride 0.9 % 100 mL IVPB        3 g 200 mL/hr over 30 Minutes Intravenous  Once 02/02/24 1227 02/02/24 1840        Assessment/Plan UGIB w/ Duodenal Ulcer - CT w/ transmural ulceration along the posterior wall of the descending duodenum with surrounding soft tissue stranding without obvious extraluminal gas. This was done w/o PO contrast.  - S/p EGD 4/7 by GI w/ Non- bleeding duodenal ulcer with adherent clot -> Hemostatic spray applied. - Had NSTEMI 4/10. Recurrent GI bleed after starting IV  heparin. S/p IR GDA embolization 4/10 - Cont PPI and carafate TID - Recommend nsaid and tobacco cessation. Also remote hx of cocaine use - recommend continued cessation. Gastric biopsies negative for H. Pylori  - Montior H/H. 7.5/22.3 (8.3/24.1) on last check. S/p 2U PRBC 4/7 and 1U 4/10. Still having bloody bm's. Unclear if this is old blood. Cont to trend hgb. - Currently HDS w/o fever, tachycardia or hypotension on last check. WBC wnl on last check. No free air on last abdominal imaging. No peritonitis on exam. No indication for emergency surgery at this time. Cont to trend hgb. We will follow hgb's peripherally to ensure they stabilize and bloody bm's resolve.   FEN - On soft diet VTE - SCDs, per GI, cards and primary  ID - None currently.    NSTEMI AAA s/p repair CAD s/p stent HTN COPD HTN  I reviewed nursing notes, Consultant GI, cards notes, hospitalist notes, last 24 h vitals and pain scores, last 48 h intake and output, last 24 h labs and trends, and last 24 h imaging results.     LOS: 3 days    Jacinto Halim, [facility] 02/06/2024, 8:14 AM Please see Amion for pager number during day hours 7:00am-4:30pm

## 2024-02-06 NOTE — Progress Notes (Addendum)
 Patient Name: Anthony Coffey Date of Encounter: 02/06/2024 [facility] Cardiologist: None   Interval Summary  .    Overall feels ok, fatigued when getting up. No chest pain since that one episode 4/9  Vital Signs .    Vitals:   02/05/24 1946 02/05/24 2306 02/06/24 0322 02/06/24 0725  BP: 112/64 111/70 108/68 121/69  Pulse:    73  Resp: 17 20 20 14   Temp: 98.3 F (36.8 C) 98.5 F (36.9 C) 97.8 F (36.6 C) 98.7 F (37.1 C)  TempSrc: Oral Oral Oral Oral  SpO2:    98%  Weight:      Height:        Intake/Output Summary (Last 24 hours) at 02/06/2024 1022 Last data filed at 02/05/2024 2116 Gross per 24 hour  Intake 780 ml  Output 0 ml  Net 780 ml      02/02/2024    1:17 PM 02/02/2024    6:20 AM 02/12/2023    8:54 AM  Last 3 Weights  Weight (lbs) 175 lb 0.7 oz 175 lb 180 lb  Weight (kg) 79.4 kg 79.379 kg 81.647 kg      Telemetry/ECG    Sinus Rhythm - Personally Reviewed  Physical Exam .    GEN: No acute distress.   Neck: No JVD Cardiac: RRR, no murmurs, rubs, or gallops.  Respiratory: Clear to auscultation bilaterally. GI: Soft, nontender, non-distended  MS: No edema  Assessment & Plan .     68 year old male with past medical history of CAD s/p PCI to OM and LAD in 2018, hypertension, hyperlipidemia, AAA s/p repair and COPD.   NSTEMI  CAD s/p PCI to OM/LAD '18 -- presented with significant UGIB and hgb of 7.3. hsTn peaked at 3235, unclear whether this is a type I or II NSTEMI but suspect may be demand in the setting of his GI bleed and anemia -- echo 4/10 showed LVEF of 60-65%, hypokinesis of the inferior wall, normal RV, mild to moderate MR. No recurrent chest pain.  -- given his GIB and anemia he is not a candidate invasive study at this time  -- on statin  Acute UGIB with duodenal ulcer -- CT angiogram abdomen and pelvis done showed a transmural ulceration along the posterior wall of the descending duodenum with surrounding soft tissue stranding.  No extraluminal gas identified to suggest free perforation. No signs of active GI bleed.  -- initially placed on IV heparin but bled again -- seen by GI with recs for IR embolization which was done 4/10. Gastric biopsies negative for H. Pylori -- on PPI and carafate  -- Hgb 8.3>>7.5 today, still with blood in BM but unclear if this is old blood.  -- GI and gen surgery following  For questions or updates, please contact [facility] Please consult [email or web address] for contact info under        Signed, Laverda Page, NP    Patient seen and examined. Agree with assessment and plan.  No chest pain or shortness of breath.  However patient is anxious and does not feel full of energy.  Telemetry shows sinus rhythm at 77 bpm without ectopy.  No significant ST-T abnormalities.  Blood pressure is stable.  Patient is status post interventional radiology embolization of the gastroduodenal artery which was successfully occluded with vascular plug.  Plan medical therapy and with his recent GI bleed would not recommend invasive evaluation at this time.  Patient currently getting blood cell transfusion with hemoglobin down  to 7.5 today with heme positive stool.   Lennette Bihari, MD, [facility] 02/06/2024 2:49 PM

## 2024-02-06 NOTE — Progress Notes (Signed)
 Referring Provider(s): Ms. Eliott Nine, PA-C  Supervising Physician: Richarda Overlie  Patient Status:  [facility] - In-pt  Chief Complaint: Upper GI bleed.  Brief History:  Acute major GI bleed with hypotension, secondary to large and deep 3 cm duodenal bulb ulcer. S/p acute MI 02/05/2024 . Pt was briefly initiated on heparin, but bled again, with down trending Hgb. He received 2 units packed RBCs. He is s/p IR celiac and GDA arteriography and embolization of the gastroduodenal artery.  Subjective:  Patient is laying comfortably in bed. No new concerns.  1 unit PRBC being transfused while on rounds. He states he does feel somewhat improved today.  Ambulated this am without overt discomfort nor concerns. No groin pain.   Allergies: Patient has no known allergies.  Medications: Prior to Admission medications   Medication Sig Start Date End Date Taking? Authorizing Provider  acetaminophen (TYLENOL) 500 MG tablet Take 1 tablet (500 mg total) by mouth every 6 (six) hours as needed for headache (pain). 02/04/17  Yes Arty Baumgartner, NP     Vital Signs: BP 123/87   Pulse 77   Temp 98.7 F (37.1 C) (Oral)   Resp (!) 21   Ht 5\' 9"  (1.753 m)   Wt 175 lb 0.7 oz (79.4 kg)   SpO2 99%   BMI 25.85 kg/m   Physical Exam Constitutional:      Appearance: Normal appearance.  Cardiovascular:     Rate and Rhythm: Normal rate.  Pulmonary:     Effort: Pulmonary effort is normal.  Musculoskeletal:        General: Normal range of motion.  Skin:    General: Skin is warm and dry.     Comments: Right femoral access site minimally tender to palpation, appropriately dressed. No induration, bruising, oozing nor bleeding.   Neurological:     Mental Status: He is alert and oriented to person, place, and time.      Labs:  CBC: Recent Labs    02/03/24 0919 02/03/24 1910 02/04/24 2333 02/05/24 0836 02/05/24 1015 02/06/24 0356 02/06/24 0920  WBC 9.4  --  10.3 9.4  --  10.2  --   HGB  7.6*   < > 8.1* 9.2* 8.3* 7.6*  7.5* 7.5*  HCT 22.2*   < > 23.6* 26.9* 24.1* 22.3*  22.3* 22.2*  PLT 359  --  408* 430*  --  381  --    < > = values in this interval not displayed.    COAGS: Recent Labs    02/02/24 0636  INR 1.1    BMP: Recent Labs    02/03/24 0322 02/04/24 2333 02/05/24 0836 02/06/24 0356  NA 138 135 140 136  K 3.5 2.9* 3.4* 3.6  CL 106 104 106 108  CO2 23 19* 23 21*  GLUCOSE 94 111* 105* 108*  BUN 9 5* <5* 5*  CALCIUM 8.3* 8.1* 8.2* 8.3*  CREATININE 0.57* 0.69 0.71 0.66  GFRNONAA >60 >60 >60 >60    LIVER FUNCTION TESTS: Recent Labs    02/12/23 0907 02/02/24 0636  BILITOT 0.7 0.3  AST 25 30  ALT 19 15  ALKPHOS 58 55  PROT 7.2 5.5*  ALBUMIN 4.3 2.8*    Assessment and Plan: Right groin access site is soft, minimally tender, with no active bleeding and no appreciable pseudoaneurysm. Dressing is clean, dry, intact.   Patient is feeling improved s/p IR celiac and GDA arteriography and embolization of the gastroduodenal artery, as well as PRBC  transfusion.  IR Service will continue to follow. Please reach out with any interim concerns.    Electronically Signed: Sable Feil, PA-C 02/06/2024, 3:07 PM     I spent a total of 15 Minutes at the the patient's bedside AND on the patient's hospital floor or unit, greater than 50% of which was counseling/coordinating care for upper GI bleed.

## 2024-02-06 NOTE — Progress Notes (Addendum)
 Patient ID: Anthony Coffey, male   DOB: Feb 22, 1956, 68 y.o.   MRN: [id number]    Progress Note   Subjective   Day # 5 CC; acute major GI bleed with hypotension found secondary to very large deep 3 cm duodenal bulb ulcer  Status postacute MI 02/05/2024-briefly on heparin, then rebled yesterday with drop in hemoglobin, transfused 2 units packed RBCs.  Status post IR celiac and G DA arteriography and embolization of the gastroduodenal artery.  Patient has been stable overnight, no further complaints of chest pain, denies shortness of breath, no complaints of abdominal pain today for nausea.  He had not had any further bowel movement since yesterday's procedure, did have a bowel movement this morning which he says still has blood in it.  Labs this a.m. hemoglobin 7.5/hematocrit 22.2-stable over the past 6 hours Potassium 3.6/BUN 5/creatinine 0.66    Objective   Vital signs in last 24 hours: Temp:  [97.8 F (36.6 C)-99.3 F (37.4 C)] 98.7 F (37.1 C) (04/11 0725) Pulse Rate:  [73-98] 73 (04/11 0725) Resp:  [13-24] 14 (04/11 0725) BP: (94-136)/(62-82) 121/69 (04/11 0725) SpO2:  [97 %-100 %] 98 % (04/11 0725) Last BM Date : 02/05/24 General:    Overweight male in NAD, pale, in no acute distress appears comfortable Heart:  Regular rate and rhythm; no murmurs Lungs: Respirations even and unlabored, lungs CTA bilaterally Abdomen:  Soft, nontender and nondistended. Normal bowel sounds. Extremities:  Without edema. Neurologic:  Alert and oriented,  grossly normal neurologically. Psych:  Cooperative. Normal mood and affect.  Intake/Output from previous day: 04/10 0701 - 04/11 0700 In: 795 [P.O.:495; Blood:300] Out: 0  Intake/Output this shift: No intake/output data recorded.  Lab Results: Recent Labs    02/04/24 2333 02/05/24 0836 02/05/24 1015 02/06/24 0356 02/06/24 0920  WBC 10.3 9.4  --   --   --   HGB 8.1* 9.2* 8.3* 7.5* 7.5*  HCT 23.6* 26.9* 24.1* 22.3* 22.2*  PLT 408*  430*  --   --   --    BMET Recent Labs    02/04/24 2333 02/05/24 0836 02/06/24 0356  NA 135 140 136  K 2.9* 3.4* 3.6  CL 104 106 108  CO2 19* 23 21*  GLUCOSE 111* 105* 108*  BUN 5* <5* 5*  CREATININE 0.69 0.71 0.66  CALCIUM 8.1* 8.2* 8.3*   LFT No results for input(s): "PROT", "ALBUMIN", "AST", "ALT", "ALKPHOS", "BILITOT", "BILIDIR", "IBILI" in the last 72 hours. PT/INR No results for input(s): "LABPROT", "INR" in the last 72 hours.  Studies/Results: IR Angiogram Visceral Selective Result Date: 02/05/2024 INDICATION: Persistent bleeding from duodenal bulb ulcer after prior endoscopic intervention 3 days ago. EXAM: 1. ULTRASOUND GUIDANCE FOR VASCULAR ACCESS OF THE RIGHT COMMON FEMORAL ARTERY 2. SELECTIVE ARTERIOGRAPHY OF THE CELIAC AXIS 3. SELECTIVE ARTERIOGRAPHY OF THE GASTRODUODENAL ARTERY 4. TRANSCATHETER EMBOLIZATION OF THE GASTRODUODENAL ARTERY TO TREAT ARTERIAL HEMORRHAGE MEDICATIONS: 2 G IV ANCEF. The antibiotic was administered within 1 hour of the procedure ANESTHESIA/SEDATION: Moderate (conscious) sedation was employed during this procedure. A total of Versed 4.0 mg was administered intravenously. Moderate Sedation Time: 65 minutes. The patient's level of consciousness and vital signs were monitored continuously by radiology nursing throughout the procedure under my direct supervision. CONTRAST:  75 ML OMNIPAQUE 300 FLUOROSCOPY TIME:  Radiation Exposure Index (as provided by the fluoroscopic device): 288 mGy Kerma COMPLICATIONS: None immediate. PROCEDURE: Informed consent was obtained from the patient following explanation of the procedure, risks, benefits and alternatives. The patient understands,  agrees and consents for the procedure. All questions were addressed. A time out was performed prior to the initiation of the procedure. Maximal barrier sterile technique utilized including caps, mask, sterile gowns, sterile gloves, large sterile drape, hand hygiene, and chlorhexidine prep.  A time-out was performed prior to initiating the procedure. Ultrasound was used to confirm patency of the right common femoral artery. An ultrasound image was saved and recorded. Local anesthesia was provided with 1% lidocaine. Access of the right common femoral artery was performed under direct ultrasound guidance with a micropuncture set. A 5 French sheath was placed over a guidewire. A 5 [place] Kumpe the catheter was advanced into the right iliac artery. Contrast was injected and the catheter further advanced into the abdominal aorta over a hydrophilic guidewire. A 6 French, 45 cm sheath was then advanced into the abdominal aorta over a guidewire. The celiac axis was selectively catheterized with a 5 [place] Cobra catheter. Selective arteriography was performed. The gastroduodenal artery was then selectively catheterized with the 5 [place] Cobra catheter. Selective arteriography was performed of the gastroduodenal artery. A Medtronic MVP-5Q microvascular plug was then advanced through the 5 French catheter and unsheathed at the level of the gastroduodenal artery. The plug was then deployed. Repeat arteriography was then performed through the 5 French catheter after waiting a few minutes. The 5 French catheter was removed. The 6 French sheath was retracted into the pelvis and oblique arteriography performed at the level of the right femoral access site. Arteriotomy closure was performed with the Angio-Seal device. FINDINGS: Celiac arteriography demonstrates tortuosity of the celiac bifurcation with patent splenic, common hepatic and left gastric arteries demonstrated. The gastroduodenal artery is visualized and terminates in the gastroepiploic artery. The gastroduodenal artery was selectively catheterized with arteriography demonstrating normal patency. No pseudoaneurysm or arterial contrast extravasation identified. The gastroduodenal artery was successfully occluded after deployment of a single microvascular plug  resulting in complete occlusion of the artery. 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 Electronically Signed   By: Irish Lack M.D.   On: 02/05/2024 14:11   IR EMBO ART  VEN HEMORR LYMPH EXTRAV  INC GUIDE ROADMAPPING Result Date: 02/05/2024 INDICATION: Persistent bleeding from duodenal bulb ulcer after prior endoscopic intervention 3 days ago. EXAM: 1. ULTRASOUND GUIDANCE FOR VASCULAR ACCESS OF THE RIGHT COMMON FEMORAL ARTERY 2. SELECTIVE ARTERIOGRAPHY OF THE CELIAC AXIS 3. SELECTIVE ARTERIOGRAPHY OF THE GASTRODUODENAL ARTERY 4. TRANSCATHETER EMBOLIZATION OF THE GASTRODUODENAL ARTERY TO TREAT ARTERIAL HEMORRHAGE MEDICATIONS: 2 G IV ANCEF. The antibiotic was administered within 1 hour of the procedure ANESTHESIA/SEDATION: Moderate (conscious) sedation was employed during this procedure. A total of Versed 4.0 mg was administered intravenously. Moderate Sedation Time: 65 minutes. The patient's level of consciousness and vital signs were monitored continuously by radiology nursing throughout the procedure under my direct supervision. CONTRAST:  75 ML OMNIPAQUE 300 FLUOROSCOPY TIME:  Radiation Exposure Index (as provided by the fluoroscopic device): 288 mGy Kerma COMPLICATIONS: None immediate. PROCEDURE: Informed consent was obtained from the patient following explanation of the procedure, risks, benefits and alternatives. The patient understands, agrees and consents for the procedure. All questions were addressed. A time out was performed prior to the initiation of the procedure. Maximal barrier sterile technique utilized including caps, mask, sterile gowns, sterile gloves, large sterile drape, hand hygiene, and chlorhexidine prep. A time-out was performed prior to initiating the procedure. Ultrasound was used to confirm  patency of the right common femoral artery. An ultrasound  image was saved and recorded. Local anesthesia was provided with 1% lidocaine. Access of the right common femoral artery was performed under direct ultrasound guidance with a micropuncture set. A 5 French sheath was placed over a guidewire. A 5 [place] Kumpe the catheter was advanced into the right iliac artery. Contrast was injected and the catheter further advanced into the abdominal aorta over a hydrophilic guidewire. A 6 French, 45 cm sheath was then advanced into the abdominal aorta over a guidewire. The celiac axis was selectively catheterized with a 5 [place] Cobra catheter. Selective arteriography was performed. The gastroduodenal artery was then selectively catheterized with the 5 [place] Cobra catheter. Selective arteriography was performed of the gastroduodenal artery. A Medtronic MVP-5Q microvascular plug was then advanced through the 5 French catheter and unsheathed at the level of the gastroduodenal artery. The plug was then deployed. Repeat arteriography was then performed through the 5 French catheter after waiting a few minutes. The 5 French catheter was removed. The 6 French sheath was retracted into the pelvis and oblique arteriography performed at the level of the right femoral access site. Arteriotomy closure was performed with the Angio-Seal device. FINDINGS: Celiac arteriography demonstrates tortuosity of the celiac bifurcation with patent splenic, common hepatic and left gastric arteries demonstrated. The gastroduodenal artery is visualized and terminates in the gastroepiploic artery. The gastroduodenal artery was selectively catheterized with arteriography demonstrating normal patency. No pseudoaneurysm or arterial contrast extravasation identified. The gastroduodenal artery was successfully occluded after deployment of a single microvascular plug resulting in complete occlusion of the artery. IMPRESSION: Occlusion of gastroduodenal artery by deployment of a transcatheter microvascular plug in  order to treat persistent bleeding from an ulcer of the duodenal bulb. Electronically Signed   By: Irish Lack M.D.   On: 02/05/2024 14:11   IR Angiogram Selective Each Additional Vessel Result Date: 02/05/2024 INDICATION: Persistent bleeding from duodenal bulb ulcer after prior endoscopic intervention 3 days ago. EXAM: 1. ULTRASOUND GUIDANCE FOR VASCULAR ACCESS OF THE RIGHT COMMON FEMORAL ARTERY 2. SELECTIVE ARTERIOGRAPHY OF THE CELIAC AXIS 3. SELECTIVE ARTERIOGRAPHY OF THE GASTRODUODENAL ARTERY 4. TRANSCATHETER EMBOLIZATION OF THE GASTRODUODENAL ARTERY TO TREAT ARTERIAL HEMORRHAGE MEDICATIONS: 2 G IV ANCEF. The antibiotic was administered within 1 hour of the procedure ANESTHESIA/SEDATION: Moderate (conscious) sedation was employed during this procedure. A total of Versed 4.0 mg was administered intravenously. Moderate Sedation Time: 65 minutes. The patient's level of consciousness and vital signs were monitored continuously by radiology nursing throughout the procedure under my direct supervision. CONTRAST:  75 ML OMNIPAQUE 300 FLUOROSCOPY TIME:  Radiation Exposure Index (as provided by the fluoroscopic device): 288 mGy Kerma COMPLICATIONS: None immediate. PROCEDURE: Informed consent was obtained from the patient following explanation of the procedure, risks, benefits and alternatives. The patient understands, agrees and consents for the procedure. All questions were addressed. A time out was performed prior to the initiation of the procedure. Maximal barrier sterile technique utilized including caps, mask, sterile gowns, sterile gloves, large sterile drape, hand hygiene, and chlorhexidine prep. A time-out was performed prior to initiating the procedure. Ultrasound was used to confirm patency of the right common femoral artery. An ultrasound image was saved and recorded. Local anesthesia was provided with 1% lidocaine. Access of the right common femoral artery was performed under direct ultrasound guidance  with a micropuncture set. A 5 French sheath was placed over a guidewire. A 5 [place] Kumpe the catheter was advanced into the right iliac artery. Contrast  was injected and the catheter further advanced into the abdominal aorta over a hydrophilic guidewire. A 6 French, 45 cm sheath was then advanced into the abdominal aorta over a guidewire. The celiac axis was selectively catheterized with a 5 [place] Cobra catheter. Selective arteriography was performed. The gastroduodenal artery was then selectively catheterized with the 5 [place] Cobra catheter. Selective arteriography was performed of the gastroduodenal artery. A Medtronic MVP-5Q microvascular plug was then advanced through the 5 French catheter and unsheathed at the level of the gastroduodenal artery. The plug was then deployed. Repeat arteriography was then performed through the 5 French catheter after waiting a few minutes. The 5 French catheter was removed. The 6 French sheath was retracted into the pelvis and oblique arteriography performed at the level of the right femoral access site. Arteriotomy closure was performed with the Angio-Seal device. FINDINGS: Celiac arteriography demonstrates tortuosity of the celiac bifurcation with patent splenic, common hepatic and left gastric arteries demonstrated. The gastroduodenal artery is visualized and terminates in the gastroepiploic artery. The gastroduodenal artery was selectively catheterized with arteriography demonstrating normal patency. No pseudoaneurysm or arterial contrast extravasation identified. The gastroduodenal artery was successfully occluded after deployment of a single microvascular plug resulting in complete occlusion of the artery. 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 Electronically Signed   By: Irish Lack M.D.   On: 02/05/2024 14:11   IR US Guide Vasc  Access Right Result Date: 02/05/2024 INDICATION: Persistent bleeding from duodenal bulb ulcer after prior endoscopic intervention 3 days ago. EXAM: 1. ULTRASOUND GUIDANCE FOR VASCULAR ACCESS OF THE RIGHT COMMON FEMORAL ARTERY 2. SELECTIVE ARTERIOGRAPHY OF THE CELIAC AXIS 3. SELECTIVE ARTERIOGRAPHY OF THE GASTRODUODENAL ARTERY 4. TRANSCATHETER EMBOLIZATION OF THE GASTRODUODENAL ARTERY TO TREAT ARTERIAL HEMORRHAGE MEDICATIONS: 2 G IV ANCEF. The antibiotic was administered within 1 hour of the procedure ANESTHESIA/SEDATION: Moderate (conscious) sedation was employed during this procedure. A total of Versed 4.0 mg was administered intravenously. Moderate Sedation Time: 65 minutes. The patient's level of consciousness and vital signs were monitored continuously by radiology nursing throughout the procedure under my direct supervision. CONTRAST:  75 ML OMNIPAQUE 300 FLUOROSCOPY TIME:  Radiation Exposure Index (as provided by the fluoroscopic device): 288 mGy Kerma COMPLICATIONS: None immediate. PROCEDURE: Informed consent was obtained from the patient following explanation of the procedure, risks, benefits and alternatives. The patient understands, agrees and consents for the procedure. All questions were addressed. A time out was performed prior to the initiation of the procedure. Maximal barrier sterile technique utilized including caps, mask, sterile gowns, sterile gloves, large sterile drape, hand hygiene, and chlorhexidine prep. A time-out was performed prior to initiating the procedure. Ultrasound was used to confirm patency of the right common femoral artery. An ultrasound image was saved and recorded. Local anesthesia was provided with 1% lidocaine. Access of the right common femoral artery was performed under direct ultrasound guidance with a micropuncture set. A 5 French sheath was placed over a guidewire. A 5 [place] Kumpe the catheter was advanced into the right iliac artery. Contrast was injected and the  catheter further advanced into the abdominal aorta over a hydrophilic guidewire. A 6 French, 45 cm sheath was then advanced into the abdominal aorta over a guidewire. The celiac axis was selectively catheterized with a 5 [place] Cobra catheter. Selective arteriography was performed. The gastroduodenal artery was then selectively catheterized with the 5 [place] Cobra catheter. Selective arteriography  was performed of the gastroduodenal artery. A Medtronic MVP-5Q microvascular plug was then advanced through the 5 French catheter and unsheathed at the level of the gastroduodenal artery. The plug was then deployed. Repeat arteriography was then performed through the 5 French catheter after waiting a few minutes. The 5 French catheter was removed. The 6 French sheath was retracted into the pelvis and oblique arteriography performed at the level of the right femoral access site. Arteriotomy closure was performed with the Angio-Seal device. FINDINGS: Celiac arteriography demonstrates tortuosity of the celiac bifurcation with patent splenic, common hepatic and left gastric arteries demonstrated. The gastroduodenal artery is visualized and terminates in the gastroepiploic artery. The gastroduodenal artery was selectively catheterized with arteriography demonstrating normal patency. No pseudoaneurysm or arterial contrast extravasation identified. The gastroduodenal artery was successfully occluded after deployment of a single microvascular plug resulting in complete occlusion of the artery. 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 Electronically Signed   By: Irish Lack M.D.   On: 02/05/2024 14:11   ECHOCARDIOGRAM COMPLETE Result Date: 02/05/2024    ECHOCARDIOGRAM REPORT   Patient Name:   Anthony Coffey Date of Exam: 02/05/2024 Medical Rec #:  [id number]     Height:       69.0 in Accession #:     [id number]    Weight:       175.0 lb Date of Birth:  1956-02-25     BSA:          1.952 m Patient Age:    67 years      BP:           121/75 mmHg Patient Gender: M             HR:           79 bpm. Exam Location:  Inpatient Procedure: 2D Echo, Cardiac Doppler and Color Doppler (Both Spectral and Color            Flow Doppler were utilized during procedure). Indications:    Acute Myocardial Infarction  History:        Patient has prior history of Echocardiogram examinations, most                 recent 02/03/2017. CAD and Previous Myocardial Infarction, COPD;                 Risk Factors:Hypertension.  Sonographer:    Maxwell Marion Referring Phys: [id number] Karl Ito IMPRESSIONS  1. Left ventricular ejection fraction, by estimation, is 60 to 65%. The left ventricle has normal function. The left ventricle demonstrates regional wall motion abnormalities (see scoring diagram/findings for description). Left ventricular diastolic parameters are indeterminate.  2. Right ventricular systolic function is normal. The right ventricular size is normal.  3. The mitral valve is normal in structure. Mild to moderate mitral valve regurgitation. No evidence of mitral stenosis.  4. The aortic valve has an indeterminant number of cusps. Aortic valve regurgitation is trivial. Aortic valve sclerosis/calcification is present, without any evidence of aortic stenosis.  5. The inferior vena cava is normal in size with greater than 50% respiratory variability, suggesting right atrial pressure of 3 mmHg. FINDINGS  Left Ventricle: Left ventricular ejection fraction, by estimation, is 60 to 65%. The left ventricle has normal function. The left ventricle demonstrates regional wall motion abnormalities. The left ventricular internal cavity size was normal in size. There is  no left ventricular hypertrophy. Left ventricular diastolic parameters are indeterminate.  LV Wall Scoring: The inferior wall is hypokinetic. The entire anterior wall, entire  lateral wall, entire septum, and entire apex are normal. Right Ventricle: The right ventricular size is normal. No increase in right ventricular wall thickness. Right ventricular systolic function is normal. Left Atrium: Left atrial size was normal in size. Right Atrium: Right atrial size was normal in size. Pericardium: There is no evidence of pericardial effusion. Mitral Valve: The mitral valve is normal in structure. Mild to moderate mitral valve regurgitation. No evidence of mitral valve stenosis. Tricuspid Valve: The tricuspid valve is normal in structure. Tricuspid valve regurgitation is not demonstrated. No evidence of tricuspid stenosis. Aortic Valve: The aortic valve has an indeterminant number of cusps. Aortic valve regurgitation is trivial. Aortic valve sclerosis/calcification is present, without any evidence of aortic stenosis. Aortic valve mean gradient measures 4.0 mmHg. Aortic valve peak gradient measures 10.5 mmHg. Aortic valve area, by VTI measures 2.77 cm. Pulmonic Valve: The pulmonic valve was normal in structure. Pulmonic valve regurgitation is not visualized. No evidence of pulmonic stenosis. Aorta: The aortic root is normal in size and structure. Venous: The inferior vena cava is normal in size with greater than 50% respiratory variability, suggesting right atrial pressure of 3 mmHg. IAS/Shunts: No atrial level shunt detected by color flow Doppler.  LEFT VENTRICLE PLAX 2D LVIDd:         4.60 cm      Diastology LVIDs:         3.40 cm      LV e' medial:    7.15 cm/s LV PW:         0.90 cm      LV E/e' medial:  10.3 LV IVS:        0.80 cm      LV e' lateral:   16.50 cm/s LVOT diam:     2.10 cm      LV E/e' lateral: 4.5 LV SV:         66 LV SV Index:   34 LVOT Area:     3.46 cm  LV Volumes (MOD) LV vol d, MOD A2C: 120.0 ml LV vol d, MOD A4C: 110.0 ml LV vol s, MOD A2C: 45.9 ml LV vol s, MOD A4C: 31.4 ml LV SV MOD A2C:     74.1 ml LV SV MOD A4C:     110.0 ml LV SV MOD BP:      75.5 ml RIGHT  VENTRICLE RV S prime:     14.80 cm/s TAPSE (M-mode): 1.8 cm LEFT ATRIUM             Index LA diam:        3.40 cm 1.74 cm/m LA Vol (A2C):   38.5 ml 19.72 ml/m LA Vol (A4C):   27.6 ml 14.14 ml/m LA Biplane Vol: 35.4 ml 18.13 ml/m  AORTIC VALVE AV Area (Vmax):    2.97 cm AV Area (Vmean):   2.73 cm AV Area (VTI):     2.77 cm AV Vmax:           162.00 cm/s AV Vmean:          92.200 cm/s AV VTI:            0.239 m AV Peak Grad:      10.5 mmHg AV Mean Grad:      4.0 mmHg LVOT Vmax:         139.00 cm/s LVOT  Vmean:        72.600 cm/s LVOT VTI:          0.191 m LVOT/AV VTI ratio: 0.80 MITRAL VALVE MV Area (PHT): 2.31 cm    SHUNTS MV Decel Time: 328 msec    Systemic VTI:  0.19 m MR Peak grad: 125.4 mmHg   Systemic Diam: 2.10 cm MR Vmax:      560.00 cm/s MV E velocity: 73.70 cm/s MV A velocity: 66.00 cm/s MV E/A ratio:  1.12 Kardie Tobb DO Electronically signed by Thomasene Ripple DO Signature Date/Time: 02/05/2024/11:25:16 AM    Final    CT ANGIO GI BLEED Result Date: 02/05/2024 CLINICAL DATA:  Rectal bleeding. Known duodenal ulcer. Acute mesenteric ischemia. EXAM: CTA ABDOMEN AND PELVIS WITHOUT AND WITH CONTRAST TECHNIQUE: Multidetector CT imaging of the abdomen and pelvis was performed using the standard protocol during bolus administration of intravenous contrast. Multiplanar reconstructed images and MIPs were obtained and reviewed to evaluate the vascular anatomy. RADIATION DOSE REDUCTION: This exam was performed according to the departmental dose-optimization program which includes automated exposure control, adjustment of the mA and/or kV according to patient size and/or use of iterative reconstruction technique. CONTRAST:  75mL OMNIPAQUE IOHEXOL 350 MG/ML SOLN COMPARISON:  02/02/2024 FINDINGS: VASCULAR Aorta: Extensive aortic atherosclerosis. Again seen are postsurgical changes from infrarenal abdominal aortic aneurysm repair. Maximum caliber of the abdominal aorta is 3.5 cm, image 119/10. Unchanged from previous  exam. No signs of acute dissection. No periaortic fluid or fat stranding. Celiac: Patent without evidence of aneurysm, dissection, vasculitis or significant stenosis. SMA: Patent without evidence of aneurysm, dissection, vasculitis or significant stenosis. Renals: Both renal arteries are patent without evidence of aneurysm, dissection, vasculitis, fibromuscular dysplasia or significant stenosis. IMA: Not visualized status post aortic repair. 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 On the right this measures 2.6 cm and on the left this measures 2.9 cm. Proximal Outflow: Bilateral common femoral and visualized portions of the superficial and profunda femoral arteries are patent without evidence of aneurysm, dissection, vasculitis or significant stenosis. Veins: No obvious venous abnormality within the limitations of this arterial phase study. Review of the MIP images confirms the above findings. NON-VASCULAR Lower chest: No acute abnormality. Hepatobiliary: No focal liver abnormality is seen. No gallstones, gallbladder wall thickening, or biliary dilatation. Pancreas: Unremarkable. No pancreatic ductal dilatation or surrounding inflammatory changes. Spleen: Normal in size without focal abnormality. Adrenals/Urinary Tract: Normal adrenal glands. No kidney mass or obstructive uropathy. 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 Urinary bladder appears normal. Stomach/Bowel: Stomach appears normal. Known descending duodenal ulceration is again noted. This measures 2.8 x 2.6 cm, image 85/10. There is surrounding soft tissue stranding as noted previously. On the precontrast images there is high density material identified within the pylorus and descending duodenum. This appears unchanged between the pre and postcontrast imaging without signs of active extravasation of IV contrast material within the lumen of the duodenum. Several foci of high  attenuation material within the small bowel loops also noted which remains unchanged between the pre and postcontrast images. No progressive areas of intraluminal contrast material identified to suggest active GI bleeding. No pathologic dilatation of the large or small bowel loops. No pneumatosis identified. No signs of bowel ischemia. Lymphatic: No signs of abdominopelvic adenopathy. Reproductive: Prostate is unremarkable. Other: No significant free fluid or fluid collections. No pneumoperitoneum. Musculoskeletal: Previous ORIF of the left proximal femur. Lumbar degenerative disc disease. No acute or suspicious osseous  findings. IMPRESSION: 1. No signs of active GI bleeding. 2. Known descending duodenal ulcer with surrounding inflammatory changes. This measures 2.8 x 2.6 cm. 3. No signs of bowel obstruction or ischemia. 4. Stable appearance of infrarenal abdominal aortic aneurysm repair. Maximum caliber of the abdominal aorta is 3.5 cm. 5. 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 Electronically Signed   By: Signa Kell M.D.   On: 02/05/2024 07:07   DG ABD ACUTE 2+V W 1V CHEST Result Date: 02/05/2024 CLINICAL DATA:  Abdomen pain EXAM: DG ABDOMEN ACUTE WITH 1 VIEW CHEST COMPARISON:  02/24/2022 FINDINGS: Single-view chest demonstrates no acute airspace disease or effusion. Normal cardiac size. No pneumothorax. Supine and upright views of the abdomen demonstrate a nonobstructed gas pattern. No free air beneath the diaphragm. IMPRESSION: No active disease. Nonobstructed gas pattern. Electronically Signed   By: Jasmine Pang M.D.   On: 02/05/2024 02:52       Assessment / Plan:    #49  68 year old white male, admitted 5 days ago with acute GI bleed complicated by hypotension. Found to have large 3 cm deep duodenal bulb ulcer with overlying clot on EGD treated with hemostatic spray  , Unfortunately patient developed chest pain early in the morning hours of 02/05/2024 lasted about 30  minutes.  Troponins positive, evaluated by cardiology and felt consistent with acute coronary syndrome/NSTEMI Was briefly started on heparin but then rebled yesterday morning and heparin discontinued  Patient required 2 units packed RBCs yesterday  Now status post IR intervention with celiac and GDA arteriography and embolization of the GDA.  He had some residual abdominal pain yesterday, no complaints of abdominal pain today.  Had not had any further bowel movements after procedure yesterday but did have a bowel movement this morning which he says still contains dark red blood.  Hopefully this is old blood  Hemoglobin stable over the past several hours  #2 anemia acute secondary to acute GI bleeding-given acute MI would like to keep his hemoglobin a bit higher will give 1 unit today  #3 previous known coronary artery disease status post PCI and stents 2018 Acute NSTEMI yesterday Not felt to be a candidate for intervention per cardiology at present, and cannot anticoagulate or use antiplatelet agents presently due to acute major GI bleeding.  Plan; full liquid diet today, if no further evidence of bleeding during the day we can advance to a soft diet Continue IV PPI Will transfuse 1 unit of packed RBCs, continue to trend hemoglobin with plan to keep his hemoglobin closer to 8 given acute MI  GI will continue to follow with you    Principal Problem:   GI bleed Active Problems:   Tobacco abuse   Acute duodenal ulcer with bleeding   ABLA (acute blood loss anemia)   Transient hypotension   History of AAA (abdominal aortic aneurysm) repair   Chronic duodenal ulcer with bleeding   Melena   Leukocytosis   Upper GI bleed   Anxiety   Chronic obstructive pulmonary disease (HCC)   Coronary artery disease involving native heart without angina pectoris   NSTEMI (non-ST elevated myocardial infarction) (HCC)     LOS: 3 days   Amy EsterwoodPA-C  02/06/2024, 10:38 AM I have taken an  interval history, thoroughly reviewed the chart and examined the patient. I agree with the Advanced Practitioner's note, impression and recommendations, and have recorded additional findings, impressions and recommendations below. I performed a substantive portion of this encounter (>50% time spent), including a  complete performance of the medical decision making.  My additional thoughts are as follows:  Needs close monitoring.  At high risk of rebleeding even after the GDA embolization. Cannot be on antiplatelet agents or anticoagulants at the present time.   [name] [phone]

## 2024-02-07 DIAGNOSIS — Z720 Tobacco use: Secondary | ICD-10-CM | POA: Diagnosis not present

## 2024-02-07 DIAGNOSIS — Z9889 Other specified postprocedural states: Secondary | ICD-10-CM | POA: Diagnosis not present

## 2024-02-07 DIAGNOSIS — K264 Chronic or unspecified duodenal ulcer with hemorrhage: Secondary | ICD-10-CM | POA: Diagnosis not present

## 2024-02-07 DIAGNOSIS — I214 Non-ST elevation (NSTEMI) myocardial infarction: Secondary | ICD-10-CM | POA: Diagnosis not present

## 2024-02-07 DIAGNOSIS — I2510 Atherosclerotic heart disease of native coronary artery without angina pectoris: Secondary | ICD-10-CM | POA: Diagnosis not present

## 2024-02-07 DIAGNOSIS — D62 Acute posthemorrhagic anemia: Secondary | ICD-10-CM | POA: Diagnosis not present

## 2024-02-07 LAB — BASIC METABOLIC PANEL WITH GFR
Anion gap: 10 (ref 5–15)
BUN: 5 mg/dL — ABNORMAL LOW (ref 8–23)
CO2: 23 mmol/L (ref 22–32)
Calcium: 8.3 mg/dL — ABNORMAL LOW (ref 8.9–10.3)
Chloride: 104 mmol/L (ref 98–111)
Creatinine, Ser: 0.74 mg/dL (ref 0.61–1.24)
GFR, Estimated: >60 mL/min (ref >60)
Glucose, Bld: 111 mg/dL — ABNORMAL HIGH (ref 70–99)
Potassium: 3.4 mmol/L — ABNORMAL LOW (ref 3.5–5.1)
Sodium: 137 mmol/L (ref 135–145)

## 2024-02-07 LAB — HEMOGLOBIN AND HEMATOCRIT, BLOOD
HCT: 31.5 % — ABNORMAL LOW (ref 39.0–52.0)
HCT: 31.1 % — ABNORMAL LOW (ref 39.0–52.0)
Hemoglobin: 10.8 g/dL — ABNORMAL LOW (ref 13.0–17.0)
Hemoglobin: 10.6 g/dL — ABNORMAL LOW (ref 13.0–17.0)

## 2024-02-07 LAB — CBC
HCT: 28.5 % — ABNORMAL LOW (ref 39.0–52.0)
Hemoglobin: 10.1 g/dL — ABNORMAL LOW (ref 13.0–17.0)
MCH: 30.6 pg (ref 26.0–34.0)
MCHC: 35.4 g/dL (ref 30.0–36.0)
MCV: 86.4 fL (ref 80.0–100.0)
Platelets: 343 10*3/uL (ref 150–400)
RBC: 3.3 MIL/uL — ABNORMAL LOW (ref 4.22–5.81)
RDW: 13.7 % (ref 11.5–15.5)
WBC: 10.5 10*3/uL (ref 4.0–10.5)
nRBC: 0 % (ref 0.0–0.2)

## 2024-02-07 LAB — TYPE AND SCREEN
ABO/RH(D): O POS
Antibody Screen: NEGATIVE
Unit division: 0
Unit division: 0

## 2024-02-07 LAB — BPAM RBC
Blood Product Expiration Date: 202505112359
Blood Product Expiration Date: 202505112359
ISSUE DATE / TIME: 202504111416
ISSUE DATE / TIME: 202504111758
Unit Type and Rh: 5100
Unit Type and Rh: 5100

## 2024-02-07 MED ORDER — LORAZEPAM 0.5 MG PO TABS
0.5000 mg | ORAL_TABLET | Freq: Every evening | ORAL | Status: DC | PRN
  Administered 2024-02-07: 0.5 mg via ORAL
  Filled 2024-02-07: qty 1

## 2024-02-07 MED ORDER — MELATONIN 3 MG PO TABS
6.0000 mg | ORAL_TABLET | Freq: Every day | ORAL | Status: DC
  Administered 2024-02-07: 6 mg via ORAL
  Filled 2024-02-07: qty 2

## 2024-02-07 MED ORDER — PANTOPRAZOLE SODIUM 40 MG PO TBEC
40.0000 mg | DELAYED_RELEASE_TABLET | Freq: Two times a day (BID) | ORAL | Status: DC
  Administered 2024-02-07 – 2024-02-08 (×2): 40 mg via ORAL
  Filled 2024-02-07 (×2): qty 1

## 2024-02-07 MED ORDER — POTASSIUM CHLORIDE CRYS ER 20 MEQ PO TBCR
40.0000 meq | EXTENDED_RELEASE_TABLET | Freq: Once | ORAL | Status: AC
  Administered 2024-02-07: 40 meq via ORAL
  Filled 2024-02-07: qty 2

## 2024-02-07 NOTE — Progress Notes (Signed)
 [facility] Progress Note  Chief Complaint: Duodenal ulcer with hemorrhage  History:  Patient has very little abdominal pain today and is tolerating a full liquid diet without difficulty.  Says he had 2 or 3 BMs overnight that were normal and brown. No hematemesis reported No current chest pain dyspnea or dysuria   Objective:   Current Facility-Administered Medications:    0.9 %  sodium chloride infusion (Manually program via Guardrails IV Fluids), , Intravenous, Once, Angelene Kelly, MD   0.9 %  sodium chloride infusion (Manually program via Guardrails IV Fluids), , Intravenous, Once, Esterwood, Amy S, PA-C   acetaminophen (TYLENOL) tablet 650 mg, 650 mg, Oral, Q6H PRN, 650 mg at 02/07/24 0904 **OR** acetaminophen (TYLENOL) suppository 650 mg, 650 mg, Rectal, Q6H PRN, Danis, Roel Clarity III, MD   albuterol (PROVENTIL) (2.5 MG/3ML) 0.083% nebulizer solution 2.5 mg, 2.5 mg, Nebulization, Q6H PRN, Danis, Roel Clarity III, MD   atorvastatin (LIPITOR) tablet 80 mg, 80 mg, Oral, Daily, Christena Covert, MD, 80 mg at 02/07/24 1610   feeding supplement (ENSURE ENLIVE / ENSURE PLUS) liquid 237 mL, 237 mL, Oral, BID BM, Armenta Landau, MD, 237 mL at 02/07/24 0910   hydrOXYzine (ATARAX) tablet 10 mg, 10 mg, Oral, TID PRN, Armenta Landau, MD, 10 mg at 02/07/24 1057   multivitamin with minerals tablet 1 tablet, 1 tablet, Oral, Daily, Armenta Landau, MD, 1 tablet at 02/07/24 0904   nicotine (NICODERM CQ - dosed in mg/24 hours) patch 21 mg, 21 mg, Transdermal, Daily, Felipe Horton, Rondell A, MD, 21 mg at 02/07/24 0907   nitroGLYCERIN (NITROSTAT) SL tablet 0.4 mg, 0.4 mg, Sublingual, Q5 min PRN, Angelene Kelly, MD, 0.4 mg at 02/04/24 2347   pantoprazole (PROTONIX) injection 40 mg, 40 mg, Intravenous, TID, Esterwood, Amy S, PA-C, 40 mg at 02/07/24 0903   sodium chloride flush (NS) 0.9 % injection 3 mL, 3 mL, Intravenous, Q12H, Danis, Roel Clarity III, MD, 3 mL at 02/07/24 0910   sucralfate (CARAFATE) 1  GM/10ML suspension 1 g, 1 g, Oral, TID WC & HS, Christena Covert, MD, 1 g at 02/07/24 1231     Vital signs in last 24 hrs: Vitals:   02/06/24 2253 02/07/24 0700  BP: 120/68 120/74  Pulse: 79 68  Resp: 20   Temp: 98.8 F (37.1 C) 98.4 F (36.9 C)  SpO2:  98%    Intake/Output Summary (Last 24 hours) at 02/07/2024 1358 Last data filed at 02/06/2024 2207 Gross per 24 hour  Intake 1364.83 ml  Output --  Net 1364.83 ml     Physical Exam Looks well, laying in bed, alert and conversational Cardiac: RRR without murmurs, S1S2 heard, no peripheral edema Pulm: clear to auscultation bilaterally, normal RR and effort noted Abdomen: soft, no tenderness, with active bowel sounds. No guarding or palpable hepatosplenomegaly Skin; warm and dry, no jaundice  Recent Labs:     Latest Ref Rng & Units 02/07/2024   12:50 PM 02/07/2024   12:52 AM 02/06/2024    9:20 AM  CBC  WBC 4.0 - 10.5 K/uL  10.5    Hemoglobin 13.0 - 17.0 g/dL 96.0  45.4  7.5   Hematocrit 39.0 - 52.0 % 31.5  28.5  22.2   Platelets 150 - 400 K/uL  343      Recent Labs  Lab 02/02/24 0636  INR 1.1      Latest Ref Rng & Units 02/07/2024   12:52 AM 02/06/2024    3:56 AM 02/05/2024  8:36 AM  CMP  Glucose 70 - 99 mg/dL 409  811  914   BUN 8 - 23 mg/dL 5  5  <5   Creatinine 7.82 - 1.24 mg/dL 9.56  2.13  0.86   Sodium 135 - 145 mmol/L 137  136  140   Potassium 3.5 - 5.1 mmol/L 3.4  3.6  3.4   Chloride 98 - 111 mmol/L 104  108  106   CO2 22 - 32 mmol/L 23  21  23    Calcium 8.9 - 10.3 mg/dL 8.3  8.3  8.2      Radiologic studies:   Assessment & Plan  Assessment: Duodenal ulcer with hemorrhage (aspirin induced from heavy Goody powder use, H. pylori negative).  Deep ulcer with adjacent stranding on CT scan.  Underwent endoscopic endoscopic treatment, then rebled after IV heparin for non-STEMI.  Required interventional radiology for GDA embolization. He passed some old blood for a day or so afterward, now seems to have  stopped.  Responded well to transfusion yesterday and stable since.   Plan: Twice daily oral PPI Advancing to regular diet today CBC tomorrow. Cardiology and surgical service following. Call if patient seems to be rebleeding with hematemesis, melena or hematochezia.    Kerby Pearson III Office: [phone]

## 2024-02-07 NOTE — Plan of Care (Signed)
  Problem: Education: Goal: Knowledge of General Education information will improve Description: Including pain rating scale, medication(s)/side effects and non-pharmacologic comfort measures Outcome: Progressing   Problem: Clinical Measurements: Goal: Ability to maintain clinical measurements within normal limits will improve Outcome: Progressing Goal: Will remain free from infection Outcome: Progressing Goal: Diagnostic test results will improve Outcome: Progressing Goal: Respiratory complications will improve Outcome: Progressing Goal: Cardiovascular complication will be avoided Outcome: Progressing   Problem: Activity: Goal: Risk for activity intolerance will decrease Outcome: Progressing   Problem: Nutrition: Goal: Adequate nutrition will be maintained Outcome: Progressing   Problem: Elimination: Goal: Will not experience complications related to bowel motility Outcome: Progressing Goal: Will not experience complications related to urinary retention Outcome: Progressing   Problem: Pain Managment: Goal: General experience of comfort will improve and/or be controlled Outcome: Progressing   Problem: Safety: Goal: Ability to remain free from injury will improve Outcome: Progressing   Problem: Skin Integrity: Goal: Risk for impaired skin integrity will decrease Outcome: Progressing   Problem: Activity: Goal: Ability to return to baseline activity level will improve Outcome: Progressing   Problem: Cardiovascular: Goal: Ability to achieve and maintain adequate cardiovascular perfusion will improve Outcome: Progressing   Problem: Health Behavior/Discharge Planning: Goal: Ability to safely manage health-related needs after discharge will improve Outcome: Progressing

## 2024-02-07 NOTE — Progress Notes (Signed)
   Patient Name: Anthony Coffey Date of Encounter: 02/07/2024 [facility] Cardiologist: None   Interval Summary  .    Overall feels ok, fatigued when getting up. No chest pain since that one episode 4/9  Vital Signs .    Vitals:   02/06/24 1926 02/06/24 2207 02/06/24 2253 02/07/24 0700  BP: 110/66 117/70 120/68 120/74  Pulse:  78 79 68  Resp: 16 16 20    Temp: 99.2 F (37.3 C) 98.3 F (36.8 C) 98.8 F (37.1 C) 98.4 F (36.9 C)  TempSrc: Oral Oral Oral Oral  SpO2:    98%  Weight:      Height:        Intake/Output Summary (Last 24 hours) at 02/07/2024 1045 Last data filed at 02/06/2024 2207 Gross per 24 hour  Intake 1364.83 ml  Output --  Net 1364.83 ml      02/02/2024    1:17 PM 02/02/2024    6:20 AM 02/12/2023    8:54 AM  Last 3 Weights  Weight (lbs) 175 lb 0.7 oz 175 lb 180 lb  Weight (kg) 79.4 kg 79.379 kg 81.647 kg      Telemetry/ECG    Sinus Rhythm - Personally Reviewed  Physical Exam .    GEN: No acute distress.   Neck: No JVD Cardiac: RRR, no murmurs, rubs, or gallops.  Respiratory: Clear to auscultation bilaterally. GI: Soft, nontender, non-distended  MS: No edema  Assessment & Plan .     68 year old male with past medical history of CAD s/p PCI to OM and LAD in 2018, hypertension, hyperlipidemia, AAA s/p repair and COPD.   NSTEMI  CAD s/p PCI to OM/LAD '18 -- presented with significant UGIB and hgb of 7.3. hsTn peaked at 3235, unclear whether this is a type I or II NSTEMI but suspect may be demand in the setting of his GI bleed and anemia -- echo 4/10 showed LVEF of 60-65%, hypokinesis of the inferior wall, normal RV, mild to moderate MR. No recurrent chest pain.  -- given his GIB and anemia he is not a candidate invasive study or antiplatelet medications at this time  -- on atorvastatin 80mg  PO  Acute UGIB with duodenal ulcer -- CT angiogram abdomen and pelvis done showed a transmural ulceration along the posterior wall of the descending  duodenum with surrounding soft tissue stranding. No extraluminal gas identified to suggest free perforation. No signs of active GI bleed.  -- initially placed on IV heparin but bled again -- seen by GI with recs for IR embolization which was done 4/10. Gastric biopsies negative for H. Pylori -- on PPI and carafate  -- Hgb 8.3>>7.5 today, still with blood in BM but unclear if this is old blood.  -- GI and gen surgery following   We will sign off. For questions or updates, please contact [facility] Please consult [email or web address] for contact info under    We will sign off.    Signed, Efraim Grange, MD

## 2024-02-07 NOTE — Plan of Care (Signed)
   Problem: Education: Goal: Knowledge of General Education information will improve Description Including pain rating scale, medication(s)/side effects and non-pharmacologic comfort measures Outcome: Progressing

## 2024-02-07 NOTE — Progress Notes (Signed)
 PROGRESS NOTE    Anthony Coffey  ZOX:[id number] DOB: August 08, 1956 DOA: 02/02/2024 PCP: Pcp, No    Chief Complaint  Patient presents with   Rectal Bleeding    Brief Narrative:  68 year old male with medical history significant for hypertension, CAD/MI s/p staged PCI to OM and LAD 2018, COPD, tobacco abuse, abdominal aneurysm s/p repair, anxiety, uses Goody's powder twice weekly, presented to ED on 02/02/2024 with complaints of dark and bright red blood in stool and abdominal pain.  In ED, soft blood pressures of 104/51, hemoglobin 7.3, FOBT positive.  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  [facility] and general surgery were consulted.  EGD on 4/7 showed a large ulcer encompassing the entire distal duodenal bulb and the sweep without active bleeding, no visible vessel, ulcer about 30 mm in size with large overlying clot, treated with hemostatic spray.  In the absence of perforation, general surgery signed off.  Slowly advancing diet, monitor H&H closely and transfuse as needed.    Assessment & Plan:   Principal Problem:   GI bleed Active Problems:   Acute duodenal ulcer with bleeding   ABLA (acute blood loss anemia)   Leukocytosis   Transient hypotension   History of AAA (abdominal aortic aneurysm) repair   Tobacco abuse   Chronic duodenal ulcer with bleeding   Melena   Upper GI bleed   Anxiety   Chronic obstructive pulmonary disease (HCC)   Coronary artery disease involving native heart without angina pectoris   NSTEMI (non-ST elevated myocardial infarction) (HCC)   Protein-calorie malnutrition, severe  #1 acute upper GI bleed secondary to large duodenal ulcer -Patient had presented with abdominal pain and bloody bowel movements. -CT angiogram abdomen and pelvis done showed a transmural ulceration along the posterior wall of the  descending duodenum with surrounding soft tissue stranding.  No extraluminal gas identified to suggest free perforation.  No signs of active GI bleed. -Patient seen in consultation by GI, underwent upper endoscopy for 05/16/2024 which showed a large, 30 mm in largest diameter, duodenal ulcer encompassing the entire distal duodenal bulb and sweep, with large overlying clot.  Treated with hemostatic spray. -Gastric biopsy negative for H. pylori. -Patient diet advanced to a soft diet which she was tolerating and PPI initially changed to oral PPI twice daily yesterday.   -Patient noted to have had some chest pain overnight, concern for non-STEMI started on heparin drip, noted to have bloody bowel movement this morning.   -Patient status post transfusion of 3 units PRBCs during this hospitalization with hemoglobin currently at 7.5 this morning.  -CT angiogram abdomen and pelvis done (02/05/2024) with no signs of active GI bleed, known descending duodenal ulcer with surrounding inflammatory changes measures 2.8 x 2.6 cm.  No signs of bowel obstruction or ischemia.  Stable appearance of infrarenal abdominal aortic aneurysmal repair.  Maximal caliber of abdominal aorta is 3.5 cm.  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   -Patient placed back on IV PPI every 12 hours and started on Carafate per GI recommendations.   -IR consultation placed by GI for further evaluation due to ongoing GI bleed.   -Heparin discontinued due to ongoing GI bleed.   -Patient seen by IR and patient status post celiac and GDA arteriography and embolization of gastroduodenal artery by IR on 02/05/2024. -Patient stated had some bloody bowel movements overnight. -Hemoglobin  currently at 10.1 this morning posttransfusion of 2 units PRBCs on 02/06/2024 when hemoglobin was 7.5.  -Follow H&H.   -Transfusion threshold hemoglobin < 8.  -Patient counseled extensively on avoiding NSAIDs including BC powder and Goody's  powder. -Patient seen in consultation by general surgery who have signed off in the absence of perforation. -Per GI.  2.  Acute blood loss anemia -Secondary to #1. -Hemoglobin noted at 16.4 on 02/12/2023 patient on presentation noted to have a hemoglobin of 7.3. -Status post transfusion 2 units PRBCs early on in the hospitalization. -Patient noted to have received a unit of PRBCs on 02/05/2024 with repeat CBC with hemoglobin at 7.5 (02/06/2024). -Patient transfused 2 more units PRBCs on 02/06/2024 with repeat hemoglobin at 10.1 this morning. -Follow H&H. -Transfusion threshold hemoglobin < 8.  3.  Non-STEMI/CAD status post PCI to OM/LAD 2018 -Patient noted to have complaints of midsternal chest pain overnight which was severe. -EKG done with no significant ischemic changes noted. -Cardiac enzymes noted to be elevated at 2634>>> 3235>>> 2508. -2D echo done with a EF of 60 to 65%, WMA, normal right ventricular systolic function.   -Patient was placed on IV heparin however due to GI bleed, heparin was discontinued on 02/05/2024.  -Cardiology following and suspect patient may have a type I or type II non-STEMI secondary to demand in the setting of GI bleed and anemia.   -Per cardiology due to GI bleeding and anemia patient is not a candidate for invasive study at this time and recommending medical management.   -Goal to keep hemoglobin > 8. -Cardiology following appreciate input and recommendations.  4.  History of AAA -Status post aneurysmal repair and being followed by vascular surgeon in the outpatient setting. -CT imaging noted postsurgical changes from previous abdominal aortic aneurysm repair. -Infrarenal abdominal aortic aneurysm measuring 3.5 x 3.5 cm, previously 3.5 x 3.2 cm. 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 -Outpatient follow-up with vascular surgery.  5.  COPD/tobacco  abuse -Tobacco cessation.  6.  CAD status post stents -Stable. -Patient not on antiplatelets or medications prior to admission. -Outpatient follow-up.  7.  Hypertension/hypotension -Patient not on antihypertensive medications prior to admission. -BP noted to be soft which was likely secondary to GI bleed. -BP stable.  8.  Anxiety disorder -Continue hydroxyzine as needed.  -Will place a dose of Ativan as needed nightly.   DVT prophylaxis: SCDs Code Status: Full Family Communication: Updated patient.  No family at bedside. Disposition: Home when clinically stable, hemoglobin stabilized and cleared by gastroenterology.  Status is: Inpatient Remains inpatient appropriate because: Severity of illness   Consultants:  General Surgery: Dr. Dorena Gander 02/02/2024 Gastroenterology: Dr.Danis III  IR 02/05/2024: Dr. Nereida Banning  Procedures:  CT angiogram abdomen and pelvis 02/02/2024, 02/05/2024 Upper endoscopy 02/02/2024: Per Dr. Dominic Friendly III  Transfusion 2 units PRBCs 02/02/2024 Transfuse 1 unit PRBCs 02/05/2024 Transfuse 2 units PRBCs 02/06/2024 Celiac and GDA arteriography; embolization of GDA per IR: Dr. Nereida Banning 02/05/2024 2D echo 02/05/2024  Antimicrobials:  Anti-infectives (From admission, onward)    Start     Dose/Rate Route Frequency Ordered Stop   02/05/24 1147  ceFAZolin (ANCEF) IVPB 2g/100 mL premix        over 30 Minutes Intravenous Continuous PRN 02/05/24 1158 02/05/24 1147   02/02/24 1230  Ampicillin-Sulbactam (UNASYN) 3 g in sodium chloride 0.9 % 100 mL IVPB        3 g  200 mL/hr over 30 Minutes Intravenous  Once 02/02/24 1227 02/02/24 1840         Subjective: Patient laying in bed on the telephone.  Denies any chest pain or shortness of breath.  No abdominal pain.  Overall feeling better.  Stated had 3 bowel movements last night and was nonbloody.   Objective: Vitals:   02/06/24 1926 02/06/24 2207 02/06/24 2253 02/07/24 0700  BP: 110/66 117/70 120/68 120/74  Pulse:   78 79 68  Resp: 16 16 20    Temp: 99.2 F (37.3 C) 98.3 F (36.8 C) 98.8 F (37.1 C) 98.4 F (36.9 C)  TempSrc: Oral Oral Oral Oral  SpO2:    98%  Weight:      Height:        Intake/Output Summary (Last 24 hours) at 02/07/2024 1237 Last data filed at 02/06/2024 2207 Gross per 24 hour  Intake 1364.83 ml  Output --  Net 1364.83 ml   Filed Weights   02/02/24 0620 02/02/24 1317  Weight: 79.4 kg 79.4 kg    Examination:  General exam: NAD Respiratory system: CTAB.  No wheezes, no crackles, no rhonchi.  Fair air movement.  Speaking in full sentences.  Cardiovascular system: RRR no murmurs rubs or gallops.  No JVD.  No lower extremity edema.  Gastrointestinal system: Abdomen is soft, nontender, nondistended, positive bowel sounds.  No rebound.  No guarding.  Central nervous system: Alert and oriented. No focal neurological deficits. Extremities: Symmetric 5 x 5 power. Skin: No rashes, lesions or ulcers Psychiatry: Judgement and insight appear normal. Mood & affect appropriate.     Data Reviewed: I have personally reviewed following labs and imaging studies  CBC: Recent Labs  Lab 02/02/24 0636 02/02/24 1748 02/03/24 0919 02/03/24 1910 02/04/24 2333 02/05/24 0836 02/05/24 1015 02/06/24 0356 02/06/24 0920 02/07/24 0052  WBC 13.1*   < > 9.4  --  10.3 9.4  --  10.2  --  10.5  NEUTROABS 10.6*  --   --   --   --  7.3  --   --   --   --   HGB 7.3*   < > 7.6*   < > 8.1* 9.2* 8.3* 7.6*  7.5* 7.5* 10.1*  HCT 22.2*   < > 22.2*   < > 23.6* 26.9* 24.1* 22.3*  22.3* 22.2* 28.5*  MCV 91.4   < > 86.7  --  88.1 88.5  --  88.1  --  86.4  PLT 482*   < > 359  --  408* 430*  --  381  --  343   < > = values in this interval not displayed.    Basic Metabolic Panel: Recent Labs  Lab 02/03/24 0322 02/04/24 2333 02/05/24 0836 02/06/24 0356 02/07/24 0052  NA 138 135 140 136 137  K 3.5 2.9* 3.4* 3.6 3.4*  CL 106 104 106 108 104  CO2 23 19* 23 21* 23  GLUCOSE 94 111* 105* 108* 111*   BUN 9 5* <5* 5* 5*  CREATININE 0.57* 0.69 0.71 0.66 0.74  CALCIUM 8.3* 8.1* 8.2* 8.3* 8.3*  MG  --   --  1.9 2.1  --     GFR: Estimated Creatinine Clearance: 92.1 mL/min (by C-G formula based on SCr of 0.74 mg/dL).  Liver Function Tests: Recent Labs  Lab 02/02/24 0636  AST 30  ALT 15  ALKPHOS 55  BILITOT 0.3  PROT 5.5*  ALBUMIN 2.8*    CBG: No results for input(s): "  GLUCAP" in the last 168 hours.   No results found for this or any previous visit (from the past 240 hours).       Radiology Studies: IR Angiogram Visceral Selective Result Date: 02/05/2024 INDICATION: Persistent bleeding from duodenal bulb ulcer after prior endoscopic intervention 3 days ago. EXAM: 1. ULTRASOUND GUIDANCE FOR VASCULAR ACCESS OF THE RIGHT COMMON FEMORAL ARTERY 2. SELECTIVE ARTERIOGRAPHY OF THE CELIAC AXIS 3. SELECTIVE ARTERIOGRAPHY OF THE GASTRODUODENAL ARTERY 4. TRANSCATHETER EMBOLIZATION OF THE GASTRODUODENAL ARTERY TO TREAT ARTERIAL HEMORRHAGE MEDICATIONS: 2 G IV ANCEF. The antibiotic was administered within 1 hour of the procedure ANESTHESIA/SEDATION: Moderate (conscious) sedation was employed during this procedure. A total of Versed 4.0 mg was administered intravenously. Moderate Sedation Time: 65 minutes. The patient's level of consciousness and vital signs were monitored continuously by radiology nursing throughout the procedure under my direct supervision. CONTRAST:  75 ML OMNIPAQUE 300 FLUOROSCOPY TIME:  Radiation Exposure Index (as provided by the fluoroscopic device): 288 mGy Kerma COMPLICATIONS: None immediate. PROCEDURE: Informed consent was obtained from the patient following explanation of the procedure, risks, benefits and alternatives. The patient understands, agrees and consents for the procedure. All questions were addressed. A time out was performed prior to the initiation of the procedure. Maximal barrier sterile technique utilized including caps, mask, sterile gowns, sterile gloves,  large sterile drape, hand hygiene, and chlorhexidine prep. A time-out was performed prior to initiating the procedure. Ultrasound was used to confirm patency of the right common femoral artery. An ultrasound image was saved and recorded. Local anesthesia was provided with 1% lidocaine. Access of the right common femoral artery was performed under direct ultrasound guidance with a micropuncture set. A 5 French sheath was placed over a guidewire. A 5 [place] Kumpe the catheter was advanced into the right iliac artery. Contrast was injected and the catheter further advanced into the abdominal aorta over a hydrophilic guidewire. A 6 French, 45 cm sheath was then advanced into the abdominal aorta over a guidewire. The celiac axis was selectively catheterized with a 5 [place] Cobra catheter. Selective arteriography was performed. The gastroduodenal artery was then selectively catheterized with the 5 [place] Cobra catheter. Selective arteriography was performed of the gastroduodenal artery. A Medtronic MVP-5Q microvascular plug was then advanced through the 5 French catheter and unsheathed at the level of the gastroduodenal artery. The plug was then deployed. Repeat arteriography was then performed through the 5 French catheter after waiting a few minutes. The 5 French catheter was removed. The 6 French sheath was retracted into the pelvis and oblique arteriography performed at the level of the right femoral access site. Arteriotomy closure was performed with the Angio-Seal device. FINDINGS: Celiac arteriography demonstrates tortuosity of the celiac bifurcation with patent splenic, common hepatic and left gastric arteries demonstrated. The gastroduodenal artery is visualized and terminates in the gastroepiploic artery. The gastroduodenal artery was selectively catheterized with arteriography demonstrating normal patency. No pseudoaneurysm or arterial contrast extravasation identified. The gastroduodenal artery was  successfully occluded after deployment of a single microvascular plug resulting in complete occlusion of the artery. 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 Electronically Signed   By: Erica Hau M.D.   On: 02/05/2024 14:11   IR EMBO ART  VEN HEMORR LYMPH EXTRAV  INC GUIDE ROADMAPPING Result Date: 02/05/2024 INDICATION: Persistent bleeding from duodenal bulb ulcer after prior endoscopic intervention 3 days ago. EXAM: 1. ULTRASOUND GUIDANCE FOR  VASCULAR ACCESS OF THE RIGHT COMMON FEMORAL ARTERY 2. SELECTIVE ARTERIOGRAPHY OF THE CELIAC AXIS 3. SELECTIVE ARTERIOGRAPHY OF THE GASTRODUODENAL ARTERY 4. TRANSCATHETER EMBOLIZATION OF THE GASTRODUODENAL ARTERY TO TREAT ARTERIAL HEMORRHAGE MEDICATIONS: 2 G IV ANCEF. The antibiotic was administered within 1 hour of the procedure ANESTHESIA/SEDATION: Moderate (conscious) sedation was employed during this procedure. A total of Versed 4.0 mg was administered intravenously. Moderate Sedation Time: 65 minutes. The patient's level of consciousness and vital signs were monitored continuously by radiology nursing throughout the procedure under my direct supervision. CONTRAST:  75 ML OMNIPAQUE 300 FLUOROSCOPY TIME:  Radiation Exposure Index (as provided by the fluoroscopic device): 288 mGy Kerma COMPLICATIONS: None immediate. PROCEDURE: Informed consent was obtained from the patient following explanation of the procedure, risks, benefits and alternatives. The patient understands, agrees and consents for the procedure. All questions were addressed. A time out was performed prior to the initiation of the procedure. Maximal barrier sterile technique utilized including caps, mask, sterile gowns, sterile gloves, large sterile drape, hand hygiene, and chlorhexidine prep. A time-out was performed prior to initiating the procedure. Ultrasound was used to  confirm patency of the right common femoral artery. An ultrasound image was saved and recorded. Local anesthesia was provided with 1% lidocaine. Access of the right common femoral artery was performed under direct ultrasound guidance with a micropuncture set. A 5 French sheath was placed over a guidewire. A 5 [place] Kumpe the catheter was advanced into the right iliac artery. Contrast was injected and the catheter further advanced into the abdominal aorta over a hydrophilic guidewire. A 6 French, 45 cm sheath was then advanced into the abdominal aorta over a guidewire. The celiac axis was selectively catheterized with a 5 [place] Cobra catheter. Selective arteriography was performed. The gastroduodenal artery was then selectively catheterized with the 5 [place] Cobra catheter. Selective arteriography was performed of the gastroduodenal artery. A Medtronic MVP-5Q microvascular plug was then advanced through the 5 French catheter and unsheathed at the level of the gastroduodenal artery. The plug was then deployed. Repeat arteriography was then performed through the 5 French catheter after waiting a few minutes. The 5 French catheter was removed. The 6 French sheath was retracted into the pelvis and oblique arteriography performed at the level of the right femoral access site. Arteriotomy closure was performed with the Angio-Seal device. FINDINGS: Celiac arteriography demonstrates tortuosity of the celiac bifurcation with patent splenic, common hepatic and left gastric arteries demonstrated. The gastroduodenal artery is visualized and terminates in the gastroepiploic artery. The gastroduodenal artery was selectively catheterized with arteriography demonstrating normal patency. No pseudoaneurysm or arterial contrast extravasation identified. The gastroduodenal artery was successfully occluded after deployment of a single microvascular plug resulting in complete occlusion of the artery. IMPRESSION: Occlusion of  gastroduodenal artery by deployment of a transcatheter microvascular plug in order to treat persistent bleeding from an ulcer of the duodenal bulb. Electronically Signed   By: Erica Hau M.D.   On: 02/05/2024 14:11   IR Angiogram Selective Each Additional Vessel Result Date: 02/05/2024 INDICATION: Persistent bleeding from duodenal bulb ulcer after prior endoscopic intervention 3 days ago. EXAM: 1. ULTRASOUND GUIDANCE FOR VASCULAR ACCESS OF THE RIGHT COMMON FEMORAL ARTERY 2. SELECTIVE ARTERIOGRAPHY OF THE CELIAC AXIS 3. SELECTIVE ARTERIOGRAPHY OF THE GASTRODUODENAL ARTERY 4. TRANSCATHETER EMBOLIZATION OF THE GASTRODUODENAL ARTERY TO TREAT ARTERIAL HEMORRHAGE MEDICATIONS: 2 G IV ANCEF. The antibiotic was administered within 1 hour of the procedure ANESTHESIA/SEDATION: Moderate (conscious) sedation was employed during this procedure. A total of Versed 4.0  mg was administered intravenously. Moderate Sedation Time: 65 minutes. The patient's level of consciousness and vital signs were monitored continuously by radiology nursing throughout the procedure under my direct supervision. CONTRAST:  75 ML OMNIPAQUE 300 FLUOROSCOPY TIME:  Radiation Exposure Index (as provided by the fluoroscopic device): 288 mGy Kerma COMPLICATIONS: None immediate. PROCEDURE: Informed consent was obtained from the patient following explanation of the procedure, risks, benefits and alternatives. The patient understands, agrees and consents for the procedure. All questions were addressed. A time out was performed prior to the initiation of the procedure. Maximal barrier sterile technique utilized including caps, mask, sterile gowns, sterile gloves, large sterile drape, hand hygiene, and chlorhexidine prep. A time-out was performed prior to initiating the procedure. Ultrasound was used to confirm patency of the right common femoral artery. An ultrasound image was saved and recorded. Local anesthesia was provided with 1% lidocaine. Access of  the right common femoral artery was performed under direct ultrasound guidance with a micropuncture set. A 5 French sheath was placed over a guidewire. A 5 [place] Kumpe the catheter was advanced into the right iliac artery. Contrast was injected and the catheter further advanced into the abdominal aorta over a hydrophilic guidewire. A 6 French, 45 cm sheath was then advanced into the abdominal aorta over a guidewire. The celiac axis was selectively catheterized with a 5 [place] Cobra catheter. Selective arteriography was performed. The gastroduodenal artery was then selectively catheterized with the 5 [place] Cobra catheter. Selective arteriography was performed of the gastroduodenal artery. A Medtronic MVP-5Q microvascular plug was then advanced through the 5 French catheter and unsheathed at the level of the gastroduodenal artery. The plug was then deployed. Repeat arteriography was then performed through the 5 French catheter after waiting a few minutes. The 5 French catheter was removed. The 6 French sheath was retracted into the pelvis and oblique arteriography performed at the level of the right femoral access site. Arteriotomy closure was performed with the Angio-Seal device. FINDINGS: Celiac arteriography demonstrates tortuosity of the celiac bifurcation with patent splenic, common hepatic and left gastric arteries demonstrated. The gastroduodenal artery is visualized and terminates in the gastroepiploic artery. The gastroduodenal artery was selectively catheterized with arteriography demonstrating normal patency. No pseudoaneurysm or arterial contrast extravasation identified. The gastroduodenal artery was successfully occluded after deployment of a single microvascular plug resulting in complete occlusion of the artery. 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 Electronically  Signed   By: Erica Hau M.D.   On: 02/05/2024 14:11   IR US  Guide Vasc Access Right Result Date: 02/05/2024 INDICATION: Persistent bleeding from duodenal bulb ulcer after prior endoscopic intervention 3 days ago. EXAM: 1. ULTRASOUND GUIDANCE FOR VASCULAR ACCESS OF THE RIGHT COMMON FEMORAL ARTERY 2. SELECTIVE ARTERIOGRAPHY OF THE CELIAC AXIS 3. SELECTIVE ARTERIOGRAPHY OF THE GASTRODUODENAL ARTERY 4. TRANSCATHETER EMBOLIZATION OF THE GASTRODUODENAL ARTERY TO TREAT ARTERIAL HEMORRHAGE MEDICATIONS: 2 G IV ANCEF. The antibiotic was administered within 1 hour of the procedure ANESTHESIA/SEDATION: Moderate (conscious) sedation was employed during this procedure. A total of Versed 4.0 mg was administered intravenously. Moderate Sedation Time: 65 minutes. The patient's level of consciousness and vital signs were monitored continuously by radiology nursing throughout the procedure under my direct supervision. CONTRAST:  75 ML OMNIPAQUE 300 FLUOROSCOPY TIME:  Radiation Exposure Index (as provided by the fluoroscopic device): 288 mGy Kerma COMPLICATIONS: None immediate. PROCEDURE: Informed consent was obtained from the patient  following explanation of the procedure, risks, benefits and alternatives. The patient understands, agrees and consents for the procedure. All questions were addressed. A time out was performed prior to the initiation of the procedure. Maximal barrier sterile technique utilized including caps, mask, sterile gowns, sterile gloves, large sterile drape, hand hygiene, and chlorhexidine prep. A time-out was performed prior to initiating the procedure. Ultrasound was used to confirm patency of the right common femoral artery. An ultrasound image was saved and recorded. Local anesthesia was provided with 1% lidocaine. Access of the right common femoral artery was performed under direct ultrasound guidance with a micropuncture set. A 5 French sheath was placed over a guidewire. A 5 [place] Kumpe the  catheter was advanced into the right iliac artery. Contrast was injected and the catheter further advanced into the abdominal aorta over a hydrophilic guidewire. A 6 French, 45 cm sheath was then advanced into the abdominal aorta over a guidewire. The celiac axis was selectively catheterized with a 5 [place] Cobra catheter. Selective arteriography was performed. The gastroduodenal artery was then selectively catheterized with the 5 [place] Cobra catheter. Selective arteriography was performed of the gastroduodenal artery. A Medtronic MVP-5Q microvascular plug was then advanced through the 5 French catheter and unsheathed at the level of the gastroduodenal artery. The plug was then deployed. Repeat arteriography was then performed through the 5 French catheter after waiting a few minutes. The 5 French catheter was removed. The 6 French sheath was retracted into the pelvis and oblique arteriography performed at the level of the right femoral access site. Arteriotomy closure was performed with the Angio-Seal device. FINDINGS: Celiac arteriography demonstrates tortuosity of the celiac bifurcation with patent splenic, common hepatic and left gastric arteries demonstrated. The gastroduodenal artery is visualized and terminates in the gastroepiploic artery. The gastroduodenal artery was selectively catheterized with arteriography demonstrating normal patency. No pseudoaneurysm or arterial contrast extravasation identified. The gastroduodenal artery was successfully occluded after deployment of a single microvascular plug resulting in complete occlusion of the artery. 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 Electronically Signed   By: Erica Hau M.D.   On: 02/05/2024 14:11        Scheduled Meds:  sodium chloride   Intravenous Once   sodium chloride   Intravenous Once    atorvastatin  80 mg Oral Daily   feeding supplement  237 mL Oral BID BM   multivitamin with minerals  1 tablet Oral Daily   nicotine  21 mg Transdermal Daily   pantoprazole (PROTONIX) IV  40 mg Intravenous TID   sodium chloride flush  3 mL Intravenous Q12H   sucralfate  1 g Oral TID WC & HS   Continuous Infusions:   LOS: 4 days    Time spent: 40 minutes    Hilda Lovings, MD [facility]   To contact the attending provider between 7A-7P or the covering provider during after hours 7P-7A, please log into the web site [email or web address] and access using universal [place] password for that web site. If you do not have the password, please call the hospital operator.  02/07/2024, 12:37 PM

## 2024-02-07 NOTE — Plan of Care (Signed)

## 2024-02-08 DIAGNOSIS — K264 Chronic or unspecified duodenal ulcer with hemorrhage: Secondary | ICD-10-CM | POA: Diagnosis not present

## 2024-02-08 DIAGNOSIS — E43 Unspecified severe protein-calorie malnutrition: Secondary | ICD-10-CM

## 2024-02-08 DIAGNOSIS — D62 Acute posthemorrhagic anemia: Secondary | ICD-10-CM | POA: Diagnosis not present

## 2024-02-08 DIAGNOSIS — I2510 Atherosclerotic heart disease of native coronary artery without angina pectoris: Secondary | ICD-10-CM | POA: Diagnosis not present

## 2024-02-08 DIAGNOSIS — Z9889 Other specified postprocedural states: Secondary | ICD-10-CM | POA: Diagnosis not present

## 2024-02-08 LAB — BASIC METABOLIC PANEL WITH GFR
Anion gap: 7 (ref 5–15)
BUN: 6 mg/dL — ABNORMAL LOW (ref 8–23)
CO2: 25 mmol/L (ref 22–32)
Calcium: 8.8 mg/dL — ABNORMAL LOW (ref 8.9–10.3)
Chloride: 105 mmol/L (ref 98–111)
Creatinine, Ser: 0.72 mg/dL (ref 0.61–1.24)
GFR, Estimated: >60 mL/min (ref >60)
Glucose, Bld: 106 mg/dL — ABNORMAL HIGH (ref 70–99)
Potassium: 3.9 mmol/L (ref 3.5–5.1)
Sodium: 137 mmol/L (ref 135–145)

## 2024-02-08 LAB — HEMOGLOBIN AND HEMATOCRIT, BLOOD
HCT: 38.3 % — ABNORMAL LOW (ref 39.0–52.0)
Hemoglobin: 12.5 g/dL — ABNORMAL LOW (ref 13.0–17.0)

## 2024-02-08 LAB — CBC
HCT: 32.7 % — ABNORMAL LOW (ref 39.0–52.0)
Hemoglobin: 11.1 g/dL — ABNORMAL LOW (ref 13.0–17.0)
MCH: 30 pg (ref 26.0–34.0)
MCHC: 33.9 g/dL (ref 30.0–36.0)
MCV: 88.4 fL (ref 80.0–100.0)
Platelets: 435 10*3/uL — ABNORMAL HIGH (ref 150–400)
RBC: 3.7 MIL/uL — ABNORMAL LOW (ref 4.22–5.81)
RDW: 14 % (ref 11.5–15.5)
WBC: 11.2 10*3/uL — ABNORMAL HIGH (ref 4.0–10.5)
nRBC: 0 % (ref 0.0–0.2)

## 2024-02-08 MED ORDER — ATORVASTATIN CALCIUM 80 MG PO TABS: 80.0000 mg | ORAL_TABLET | Freq: Every day | ORAL | 0 refills | Status: DC

## 2024-02-08 MED ORDER — NITROGLYCERIN 0.4 MG SL SUBL: 0.4000 mg | SUBLINGUAL_TABLET | SUBLINGUAL | 0 refills | Status: DC | PRN

## 2024-02-08 MED ORDER — PANTOPRAZOLE SODIUM 40 MG PO TBEC: 40.0000 mg | DELAYED_RELEASE_TABLET | Freq: Two times a day (BID) | ORAL | 3 refills | Status: DC

## 2024-02-08 MED ORDER — NICOTINE 21 MG/24HR TD PT24: 21.0000 mg | MEDICATED_PATCH | Freq: Every day | TRANSDERMAL | 0 refills | Status: DC

## 2024-02-08 NOTE — Progress Notes (Signed)
 Mobility Specialist Progress Note:   02/08/24 1208  Mobility  Activity Ambulated with assistance in hallway;Ambulated with assistance in room  Level of Assistance Standby assist, set-up cues, supervision of patient - no hands on  Assistive Device None  Distance Ambulated (ft) 240 ft  Activity Response Tolerated well  Mobility Referral Yes  Mobility visit 1 Mobility  Mobility Specialist Start Time (ACUTE ONLY) 1200  Mobility Specialist Stop Time (ACUTE ONLY) 1208  Mobility Specialist Time Calculation (min) (ACUTE ONLY) 8 min   Pt received in bed, agreeable to mobility session. Ambulated in hallway with out AD. SV for safety. Tolerated well, asx throughout. Returned pt to room, all needs met, eager for d/c.   Shiya Fogelman Mobility Specialist Please contact via SecureChat or  Rehab office at [phone]

## 2024-02-08 NOTE — Progress Notes (Signed)
 [facility] Progress Note  Chief Complaint: Duodenal ulcer with bleed and anemia  History:  Patient still having regular brown bowel movements, says his abdominal pain has resolved.  Denies chest pain dyspnea or dysuria.  No hematemesis, tolerating regular diet without difficulty.   Objective:   Current Facility-Administered Medications:    0.9 %  sodium chloride infusion (Manually program via Guardrails IV Fluids), , Intravenous, Once, Angelene Kelly, MD   0.9 %  sodium chloride infusion (Manually program via Guardrails IV Fluids), , Intravenous, Once, [place], Amy S, PA-C   acetaminophen (TYLENOL) tablet 650 mg, 650 mg, Oral, Q6H PRN, 650 mg at 02/08/24 0825 **OR** acetaminophen (TYLENOL) suppository 650 mg, 650 mg, Rectal, Q6H PRN, Danis, Roel Clarity III, MD   albuterol (PROVENTIL) (2.5 MG/3ML) 0.083% nebulizer solution 2.5 mg, 2.5 mg, Nebulization, Q6H PRN, Danis, Roel Clarity III, MD   atorvastatin (LIPITOR) tablet 80 mg, 80 mg, Oral, Daily, Christena Covert, MD, 80 mg at 02/08/24 0825   feeding supplement (ENSURE ENLIVE / ENSURE PLUS) liquid 237 mL, 237 mL, Oral, BID BM, Armenta Landau, MD, 237 mL at 02/07/24 0910   hydrOXYzine (ATARAX) tablet 10 mg, 10 mg, Oral, TID PRN, Armenta Landau, MD, 10 mg at 02/08/24 0825   LORazepam (ATIVAN) tablet 0.5 mg, 0.5 mg, Oral, QHS PRN, Armenta Landau, MD, 0.5 mg at 02/07/24 2354   melatonin tablet 6 mg, 6 mg, Oral, QHS, Armenta Landau, MD, 6 mg at 02/07/24 2033   multivitamin with minerals tablet 1 tablet, 1 tablet, Oral, Daily, Armenta Landau, MD, 1 tablet at 02/08/24 0825   nicotine (NICODERM CQ - dosed in mg/24 hours) patch 21 mg, 21 mg, Transdermal, Daily, Felipe Horton, Rondell A, MD, 21 mg at 02/08/24 0825   nitroGLYCERIN (NITROSTAT) SL tablet 0.4 mg, 0.4 mg, Sublingual, Q5 min PRN, Angelene Kelly, MD, 0.4 mg at 02/04/24 2347   pantoprazole (PROTONIX) EC tablet 40 mg, 40 mg, Oral, BID AC, Kerby Pearson III, MD, 40 mg at  02/08/24 0825   sodium chloride flush (NS) 0.9 % injection 3 mL, 3 mL, Intravenous, Q12H, Danis, Roel Clarity III, MD, 3 mL at 02/08/24 0827   sucralfate (CARAFATE) 1 GM/10ML suspension 1 g, 1 g, Oral, TID WC & HS, Christena Covert, MD, 1 g at 02/08/24 0825     Vital signs in last 24 hrs: Vitals:   02/08/24 0825 02/08/24 1102  BP: (!) 92/57 110/65  Pulse: 70 74  Resp: 20 16  Temp: 98.5 F (36.9 C) 98.1 F (36.7 C)  SpO2: 96% 99%    Intake/Output Summary (Last 24 hours) at 02/08/2024 1147 Last data filed at 02/07/2024 2033 Gross per 24 hour  Intake 120 ml  Output --  Net 120 ml     Physical Exam Looks well, laying comfortably in bed, alert and conversational no distress Cardiac: RRR without murmurs, S1S2 heard, no peripheral edema Pulm: clear to auscultation bilaterally, normal RR and effort noted Abdomen: soft, no tenderness, with active bowel sounds. No guarding or palpable hepatosplenomegaly Skin; warm and dry, no jaundice  Recent Labs:     Latest Ref Rng & Units 02/08/2024    4:12 AM 02/07/2024    5:54 PM 02/07/2024   12:50 PM  CBC  WBC 4.0 - 10.5 K/uL 11.2     Hemoglobin 13.0 - 17.0 g/dL 16.1  09.6  04.5   Hematocrit 39.0 - 52.0 % 32.7  31.1  31.5   Platelets 150 - 400 K/uL  435       Recent Labs  Lab 02/02/24 0636  INR 1.1      Latest Ref Rng & Units 02/08/2024    4:12 AM 02/07/2024   12:52 AM 02/06/2024    3:56 AM  CMP  Glucose 70 - 99 mg/dL 161  096  045   BUN 8 - 23 mg/dL 6  5  5    Creatinine 0.61 - 1.24 mg/dL 4.09  8.11  9.14   Sodium 135 - 145 mmol/L 137  137  136   Potassium 3.5 - 5.1 mmol/L 3.9  3.4  3.6   Chloride 98 - 111 mmol/L 105  104  108   CO2 22 - 32 mmol/L 25  23  21    Calcium 8.9 - 10.3 mg/dL 8.8  8.3  8.3      Radiologic studies:   Assessment & Plan  Assessment: Duodenal ulcer with hemorrhage (aspirin induced from heavy Goody powder use, H. pylori negative).  Deep ulcer with adjacent stranding on CT scan.  Underwent endoscopic  endoscopic treatment, then rebled after IV heparin for non-STEMI.  Required interventional radiology for GDA embolization. He passed some old blood for a day or so afterward, now seems to have stopped.  Responded well to transfusion and stable since.    Plan: No rebleeding for nearly 72 hours since ulcer intervention. He can be discharged home in my opinion for outpatient follow-up. No aspirin, NSAIDs or anticoagulants. Pantoprazole 40 mg by mouth twice a day Will contact this patient to arrange outpatient follow-up.   Kerby Pearson III Office: [phone]

## 2024-02-08 NOTE — Progress Notes (Signed)
 Discussed w/ GI APP today No further bloody bm's. Hgb stable and uptrending. Last transfusion 4/11. GI okay with us  signing off.  Please call back with any questions or concerns.   Yumiko Alkins M Romond Pipkins, PA-C

## 2024-02-08 NOTE — Discharge Summary (Signed)
 Physician Discharge Summary  Anthony Coffey KGM:[id number] DOB: 11/29/1955 DOA: 02/02/2024  PCP: Anthony Coffey  Admit date: 02/02/2024 Discharge date: 02/08/2024  Time spent: 60 minutes  Recommendations for Outpatient Follow-up:  Follow-up with Anthony Coffey, gastroenterology in 2 weeks.  Office will call with appointment time. Follow-up with with Anthony Coffey cardiology in 2 weeks. Follow-up with PCP in 2 weeks.  On follow-up patient will need a basic metabolic profile done to follow-up on electrolytes and renal function.  Patient needed CBC done to follow-up on hemoglobin.   Discharge Diagnoses:  Principal Problem:   Coffey bleed Active Problems:   Acute duodenal ulcer with bleeding   ABLA (acute blood loss anemia)   Leukocytosis   Transient hypotension   History of AAA (abdominal aortic aneurysm) repair   Tobacco abuse   Chronic duodenal ulcer with bleeding   Melena   Upper Coffey bleed   Anxiety   Chronic obstructive pulmonary disease (HCC)   Coronary artery disease involving native heart without angina pectoris   NSTEMI (non-ST elevated myocardial infarction) (HCC)   Protein-calorie malnutrition, severe   Discharge Condition: Stable and improved.  Diet recommendation: Heart healthy  Filed Weights   02/02/24 0620 02/02/24 1317  Weight: 79.4 kg 79.4 kg    History of present illness:  HPI per Anthony Coffey is a 68 y.o. male with medical history significant of hypertension, CAD, abdominal aneurysm s/p repair presents with blood in stool and abdominal pain.   He first noticed blood in his stool, described as both dark and bright red, beginning last night. This has occurred twice since the onset.   He has abdominal pain located in the middle of his stomach, radiating across the upper abdomen. The pain moves back and forth across the upper abdomen.   He takes two BC goody powders twice a week and denies taking other over-the-counter medications like ibuprofen, Aleve, or naproxen.   Coffey  nausea or vomiting, but he mentions difficulty walking this morning due to extreme weakness.  He has never had a endoscopy or colonoscopy previously before.  Patient has not routinely follow with a primary care provider.   En route with EMS initial blood pressure noted to be in the 70s.   In the ED patient was noted to be afebrile with blood pressure as low as 104/51,and all other vital signs maintained.  Labs significant for WBC 13.1, hemoglobin noted to be 7.3, and platelets 482.  Stool guaiacs were noted to be positive.  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  Patient was typed and screened and ordered 2 units of packed red blood cells.  Patient was given Protonix IV, 1 L of normal saline IV fluids, Zofran, and IV pain medication.  Anthony Coffey have been formally consulted.   Hospital Course:  #1 acute upper Coffey bleed secondary to large duodenal ulcer -Patient had presented with abdominal pain and bloody bowel movements. -CT angiogram abdomen and pelvis done showed a transmural ulceration along the posterior wall of the descending duodenum with surrounding soft tissue stranding.  Coffey extraluminal gas identified to suggest free perforation.  Coffey signs of active Coffey bleed. -Patient seen in consultation by Coffey, underwent upper endoscopy for 05/16/2024 which showed a large, 30 mm in largest diameter, duodenal ulcer encompassing the entire distal duodenal bulb and sweep, with large  overlying clot.  Treated with hemostatic spray. -Gastric biopsy negative for H. pylori. -Patient diet advanced to a soft diet which he was tolerating and PPI initially changed to oral PPI twice daily.   -Patient noted to have had some chest pain during the hospitalization, concern for non-STEMI started on heparin drip, noted to have bloody bowel  movement after heparin was initiated and heparin subsequently discontinued..   -Patient status post transfusion of 5 units PRBCs during this hospitalization with hemoglobin stabilizing at 12.5 by day of discharge. -CT angiogram abdomen and pelvis done (02/05/2024) with Coffey signs of active Coffey bleed, known descending duodenal ulcer with surrounding inflammatory changes measures 2.8 x 2.6 cm.  Coffey signs of bowel obstruction or ischemia.  Stable appearance of infrarenal abdominal aortic aneurysmal repair.  Maximal caliber of abdominal aorta is 3.5 cm.  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   -Patient placed back on IV PPI every 12 hours and started on Carafate per Coffey recommendations due to concern for ongoing bleeding.   -IR consultation placed by Coffey for further evaluation due to ongoing Coffey bleed.   -Heparin discontinued due to ongoing Coffey bleed.   -Patient seen by IR and patient status post celiac and GDA arteriography and embolization of gastroduodenal artery by IR on 02/05/2024. -Patient stated had some bloody bowel movements the evening of 02/06/2024 which was felt to be old blood.   -Patient had Coffey further bleeding for 72 hours prior to discharge.  -Hemoglobin improved and stabilized at 12.5 by day of discharge.   -Patient counseled extensively on avoiding NSAIDs including BC powder and Goody's powder. -Patient seen in consultation by general surgery who have signed off in the absence of perforation. -Patient cleared by Coffey for discharge and patient be discharged in stable and improved condition. -Outpatient follow-up with Coffey.   2.  Acute blood loss anemia -Secondary to #1. -Hemoglobin noted at 16.4 on 02/12/2023 patient on presentation noted to have a hemoglobin of 7.3. -Status post transfusion 2 units PRBCs early on in the hospitalization. -Patient noted to have received a unit of PRBCs on 02/05/2024 with repeat CBC with hemoglobin at 7.5 (02/06/2024). -Patient transfused 2  more units PRBCs on 02/06/2024 with hemoglobin noted at 12.5 by day of discharge.   -Outpatient follow-up with Coffey.     3.  Non-STEMI/CAD status post PCI to OM/LAD 2018 -Patient noted to have complaints of midsternal chest pain overnight which was severe. -EKG done with Coffey significant ischemic changes noted. -Cardiac enzymes noted to be elevated at 2634>>> 3235>>> 2508. -2D echo done with a EF of 60 to 65%, WMA, normal right ventricular systolic function.   -Patient was placed on IV heparin however due to Coffey bleed, heparin was discontinued on 02/05/2024.  -Cardiology followed the patient during the hospitalization and suspect patient may have a type I or type II non-STEMI secondary to demand in the setting of Coffey bleed and anemia.   -Per cardiology due to Coffey bleeding and anemia patient is not a candidate for invasive study at this time and recommended medical management.   -Goal to keep hemoglobin > 8. -Outpatient follow-up with cardiology.   4.  History of AAA -Status post aneurysmal repair and being followed by vascular surgeon in the outpatient setting. -CT imaging noted postsurgical changes from previous abdominal aortic aneurysm repair. -Infrarenal abdominal aortic aneurysm measuring 3.5 x 3.5 cm, previously 3.5 x 3.2 cm. 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 -Outpatient follow-up with vascular surgery as previously scheduled.   5.  COPD/tobacco abuse -Tobacco cessation. -Patient maintained on nicotine patch during the hospitalization.   6.  CAD status post stents -Stable. -Patient not on antiplatelets or medications prior to admission and not a candidate for antiplatelets due to recent Coffey bleed.. -Outpatient follow-up.   7.  Hypertension/hypotension -Patient not on antihypertensive medications prior to admission. -BP noted to be soft which was likely secondary to Coffey bleed. -BP  stable. -Outpatient follow-up.   8.  Anxiety disorder -Patient was placed on hydroxyzine as needed during the hospitalization. -Outpatient follow-up with PCP.      Procedures: CT angiogram abdomen and pelvis 02/02/2024, 02/05/2024 Upper endoscopy 02/02/2024: Per Anthony Coffey III  Transfusion 2 units PRBCs 02/02/2024 Transfuse 1 unit PRBCs 02/05/2024 Transfuse 2 units PRBCs 02/06/2024 Celiac and GDA arteriography; embolization of GDA per IR: Dr. Nereida Banning 02/05/2024 2D echo 02/05/2024  Consultations: General Surgery: Dr. Dorena Gander 02/02/2024 Gastroenterology: AnthonyDanis III  IR 02/05/2024: Dr. Nereida Banning  Discharge Exam: Vitals:   02/08/24 0825 02/08/24 1102  BP: (!) 92/57 110/65  Pulse: 70 74  Resp: 20 16  Temp: 98.5 F (36.9 C) 98.1 F (36.7 C)  SpO2: 96% 99%    General: NAD Cardiovascular: Regular rate rhythm Coffey murmurs rubs or gallops.  Coffey JVD.  Coffey lower extremity edema. Respiratory: Lungs clear to auscultation bilaterally.  Coffey wheezes, Coffey crackles, Coffey rhonchi.  Fair air movement.  Speaking in full sentences.  Discharge Instructions   Discharge Instructions     Diet - low sodium heart healthy   Complete by: As directed    Discharge instructions   Complete by: As directed    Coffey Nsaids, Coffey ibuprofen, Coffey Goody's powder, Coffey Aleve.   Increase activity slowly   Complete by: As directed    Increase activity slowly   Complete by: As directed    Coffey wound care   Complete by: As directed    Coffey wound care   Complete by: As directed       Allergies as of 02/08/2024   Coffey Known Allergies      Medication List     TAKE these medications    acetaminophen 500 MG tablet Commonly known as: TYLENOL Take 1 tablet (500 mg total) by mouth every 6 (six) hours as needed for headache (pain).   atorvastatin 80 MG tablet Commonly known as: LIPITOR Take 1 tablet (80 mg total) by mouth daily. Start taking on: February 09, 2024   nicotine 21 mg/24hr patch Commonly known as: NICODERM CQ -  dosed in mg/24 hours Place 1 patch (21 mg total) onto the skin daily. Start taking on: February 09, 2024   nitroGLYCERIN 0.4 MG SL tablet Commonly known as: NITROSTAT Place 1 tablet (0.4 mg total) under the tongue every 5 (five) minutes as needed for chest pain.   pantoprazole 40 MG tablet Commonly known as: PROTONIX Take 1 tablet (40 mg total) by mouth 2 (two) times daily before a meal.       Coffey Known Allergies  Follow-up Information     PCP. Schedule an appointment as soon as possible for a visit in 2 week(s).          Albertina Hugger, MD. Schedule an appointment as soon as possible for a visit in 2 week(s).   Specialty: Gastroenterology Why: Office will call with appointment time. Contact information: [address]  [address] [place] [address] [phone]         [facility] [employer]. Schedule an appointment as soon as possible for a visit in 2 week(s).   Specialty: Cardiology Contact information: [address] [place]   [address] [phone]                 The results of significant diagnostics from this hospitalization (including imaging, microbiology, ancillary and laboratory) are listed below for reference.    Significant Diagnostic Studies: IR Angiogram Visceral Selective Result Date: 02/05/2024 INDICATION: Persistent bleeding from duodenal bulb ulcer after prior endoscopic intervention 3 days ago. EXAM: 1. ULTRASOUND GUIDANCE FOR VASCULAR ACCESS OF THE RIGHT COMMON FEMORAL ARTERY 2. SELECTIVE ARTERIOGRAPHY OF THE CELIAC AXIS 3. SELECTIVE ARTERIOGRAPHY OF THE GASTRODUODENAL ARTERY 4. TRANSCATHETER EMBOLIZATION OF THE GASTRODUODENAL ARTERY TO TREAT ARTERIAL HEMORRHAGE MEDICATIONS: 2 G IV ANCEF. The antibiotic was administered within 1 hour of the procedure ANESTHESIA/SEDATION: Moderate (conscious) sedation was employed during this procedure. A total of Versed 4.0 mg was administered intravenously. Moderate  Sedation Time: 65 minutes. The patient's level of consciousness and vital signs were monitored continuously by radiology nursing throughout the procedure under my direct supervision. CONTRAST:  75 ML OMNIPAQUE 300 FLUOROSCOPY TIME:  Radiation Exposure Index (as provided by the fluoroscopic device): 288 mGy Kerma COMPLICATIONS: None immediate. PROCEDURE: Informed consent was obtained from the patient following explanation of the procedure, risks, benefits and alternatives. The patient understands, agrees and consents for the procedure. All questions were addressed. A time out was performed prior to the initiation of the procedure. Maximal barrier sterile technique utilized including caps, mask, sterile gowns, sterile gloves, large sterile drape, hand hygiene, and chlorhexidine prep. A time-out was performed prior to initiating the procedure. Ultrasound was used to confirm patency of the right common femoral artery. An ultrasound image was saved and recorded. Local anesthesia was provided with 1% lidocaine. Access of the right common femoral artery was performed under direct ultrasound guidance with a micropuncture set. A 5 French sheath was placed over a guidewire. A 5 [place] Kumpe the catheter was advanced into the right iliac artery. Contrast was injected and the catheter further advanced into the abdominal aorta over a hydrophilic guidewire. A 6 French, 45 cm sheath was then advanced into the abdominal aorta over a guidewire. The celiac axis was selectively catheterized with a 5 [place] Cobra catheter. Selective arteriography was performed. The gastroduodenal artery was then selectively catheterized with the 5 [place] Cobra catheter. Selective arteriography was performed of the gastroduodenal artery. A Medtronic MVP-5Q microvascular plug was then advanced through the 5 French catheter and unsheathed at the level of the gastroduodenal artery. The plug was then deployed. Repeat arteriography was then performed through  the 5 French catheter after waiting a few minutes. The 5 French catheter was removed. The 6 French sheath was retracted into the pelvis and oblique arteriography performed at the level of the right femoral access site. Arteriotomy closure was performed with the Angio-Seal device. FINDINGS: Celiac arteriography demonstrates tortuosity of the celiac bifurcation with patent splenic, common hepatic and left gastric arteries demonstrated. The gastroduodenal artery is visualized and terminates in the gastroepiploic artery. The gastroduodenal artery was selectively catheterized with arteriography demonstrating normal patency. Coffey pseudoaneurysm or arterial contrast extravasation identified. The gastroduodenal artery was successfully occluded after deployment of a single microvascular plug resulting in complete occlusion of the artery. IMPRESSION: Occlusion of gastroduodenal artery by deployment of a transcatheter microvascular plug in order to treat  persistent bleeding from an ulcer of the duodenal bulb. Electronically Signed   By: Erica Hau M.D.   On: 02/05/2024 14:11   IR EMBO ART  VEN HEMORR LYMPH EXTRAV  INC GUIDE ROADMAPPING Result Date: 02/05/2024 INDICATION: Persistent bleeding from duodenal bulb ulcer after prior endoscopic intervention 3 days ago. EXAM: 1. ULTRASOUND GUIDANCE FOR VASCULAR ACCESS OF THE RIGHT COMMON FEMORAL ARTERY 2. SELECTIVE ARTERIOGRAPHY OF THE CELIAC AXIS 3. SELECTIVE ARTERIOGRAPHY OF THE GASTRODUODENAL ARTERY 4. TRANSCATHETER EMBOLIZATION OF THE GASTRODUODENAL ARTERY TO TREAT ARTERIAL HEMORRHAGE MEDICATIONS: 2 G IV ANCEF. The antibiotic was administered within 1 hour of the procedure ANESTHESIA/SEDATION: Moderate (conscious) sedation was employed during this procedure. A total of Versed 4.0 mg was administered intravenously. Moderate Sedation Time: 65 minutes. The patient's level of consciousness and vital signs were monitored continuously by radiology nursing throughout the procedure  under my direct supervision. CONTRAST:  75 ML OMNIPAQUE 300 FLUOROSCOPY TIME:  Radiation Exposure Index (as provided by the fluoroscopic device): 288 mGy Kerma COMPLICATIONS: None immediate. PROCEDURE: Informed consent was obtained from the patient following explanation of the procedure, risks, benefits and alternatives. The patient understands, agrees and consents for the procedure. All questions were addressed. A time out was performed prior to the initiation of the procedure. Maximal barrier sterile technique utilized including caps, mask, sterile gowns, sterile gloves, large sterile drape, hand hygiene, and chlorhexidine prep. A time-out was performed prior to initiating the procedure. Ultrasound was used to confirm patency of the right common femoral artery. An ultrasound image was saved and recorded. Local anesthesia was provided with 1% lidocaine. Access of the right common femoral artery was performed under direct ultrasound guidance with a micropuncture set. A 5 French sheath was placed over a guidewire. A 5 [place] Kumpe the catheter was advanced into the right iliac artery. Contrast was injected and the catheter further advanced into the abdominal aorta over a hydrophilic guidewire. A 6 French, 45 cm sheath was then advanced into the abdominal aorta over a guidewire. The celiac axis was selectively catheterized with a 5 [place] Cobra catheter. Selective arteriography was performed. The gastroduodenal artery was then selectively catheterized with the 5 [place] Cobra catheter. Selective arteriography was performed of the gastroduodenal artery. A Medtronic MVP-5Q microvascular plug was then advanced through the 5 French catheter and unsheathed at the level of the gastroduodenal artery. The plug was then deployed. Repeat arteriography was then performed through the 5 French catheter after waiting a few minutes. The 5 French catheter was removed. The 6 French sheath was retracted into the pelvis and oblique  arteriography performed at the level of the right femoral access site. Arteriotomy closure was performed with the Angio-Seal device. FINDINGS: Celiac arteriography demonstrates tortuosity of the celiac bifurcation with patent splenic, common hepatic and left gastric arteries demonstrated. The gastroduodenal artery is visualized and terminates in the gastroepiploic artery. The gastroduodenal artery was selectively catheterized with arteriography demonstrating normal patency. Coffey pseudoaneurysm or arterial contrast extravasation identified. The gastroduodenal artery was successfully occluded after deployment of a single microvascular plug resulting in complete occlusion of the artery. 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 Electronically Signed   By: Erica Hau M.D.   On: 02/05/2024 14:11   IR Angiogram Selective Each Additional Vessel Result Date: 02/05/2024 INDICATION: Persistent bleeding from duodenal bulb ulcer after prior endoscopic intervention 3 days ago. EXAM: 1. ULTRASOUND GUIDANCE FOR VASCULAR ACCESS OF  THE RIGHT COMMON FEMORAL ARTERY 2. SELECTIVE ARTERIOGRAPHY OF THE CELIAC AXIS 3. SELECTIVE ARTERIOGRAPHY OF THE GASTRODUODENAL ARTERY 4. TRANSCATHETER EMBOLIZATION OF THE GASTRODUODENAL ARTERY TO TREAT ARTERIAL HEMORRHAGE MEDICATIONS: 2 G IV ANCEF. The antibiotic was administered within 1 hour of the procedure ANESTHESIA/SEDATION: Moderate (conscious) sedation was employed during this procedure. A total of Versed 4.0 mg was administered intravenously. Moderate Sedation Time: 65 minutes. The patient's level of consciousness and vital signs were monitored continuously by radiology nursing throughout the procedure under my direct supervision. CONTRAST:  75 ML OMNIPAQUE 300 FLUOROSCOPY TIME:  Radiation Exposure Index (as provided by the fluoroscopic device): 288 mGy Kerma  COMPLICATIONS: None immediate. PROCEDURE: Informed consent was obtained from the patient following explanation of the procedure, risks, benefits and alternatives. The patient understands, agrees and consents for the procedure. All questions were addressed. A time out was performed prior to the initiation of the procedure. Maximal barrier sterile technique utilized including caps, mask, sterile gowns, sterile gloves, large sterile drape, hand hygiene, and chlorhexidine prep. A time-out was performed prior to initiating the procedure. Ultrasound was used to confirm patency of the right common femoral artery. An ultrasound image was saved and recorded. Local anesthesia was provided with 1% lidocaine. Access of the right common femoral artery was performed under direct ultrasound guidance with a micropuncture set. A 5 French sheath was placed over a guidewire. A 5 [place] Kumpe the catheter was advanced into the right iliac artery. Contrast was injected and the catheter further advanced into the abdominal aorta over a hydrophilic guidewire. A 6 French, 45 cm sheath was then advanced into the abdominal aorta over a guidewire. The celiac axis was selectively catheterized with a 5 [place] Cobra catheter. Selective arteriography was performed. The gastroduodenal artery was then selectively catheterized with the 5 [place] Cobra catheter. Selective arteriography was performed of the gastroduodenal artery. A Medtronic MVP-5Q microvascular plug was then advanced through the 5 French catheter and unsheathed at the level of the gastroduodenal artery. The plug was then deployed. Repeat arteriography was then performed through the 5 French catheter after waiting a few minutes. The 5 French catheter was removed. The 6 French sheath was retracted into the pelvis and oblique arteriography performed at the level of the right femoral access site. Arteriotomy closure was performed with the Angio-Seal device. FINDINGS: Celiac arteriography  demonstrates tortuosity of the celiac bifurcation with patent splenic, common hepatic and left gastric arteries demonstrated. The gastroduodenal artery is visualized and terminates in the gastroepiploic artery. The gastroduodenal artery was selectively catheterized with arteriography demonstrating normal patency. Coffey pseudoaneurysm or arterial contrast extravasation identified. The gastroduodenal artery was successfully occluded after deployment of a single microvascular plug resulting in complete occlusion of the artery. 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 Electronically Signed   By: Erica Hau M.D.   On: 02/05/2024 14:11   IR US  Guide Vasc Access Right Result Date: 02/05/2024 INDICATION: Persistent bleeding from duodenal bulb ulcer after prior endoscopic intervention 3 days ago. EXAM: 1. ULTRASOUND GUIDANCE FOR VASCULAR ACCESS OF THE RIGHT COMMON FEMORAL ARTERY 2. SELECTIVE ARTERIOGRAPHY OF THE CELIAC AXIS 3. SELECTIVE ARTERIOGRAPHY OF THE GASTRODUODENAL ARTERY 4. TRANSCATHETER EMBOLIZATION OF THE GASTRODUODENAL ARTERY TO TREAT ARTERIAL HEMORRHAGE MEDICATIONS: 2 G IV ANCEF. The antibiotic was administered within 1 hour of the procedure ANESTHESIA/SEDATION: Moderate (conscious) sedation was employed during this procedure. A total of Versed 4.0 mg was administered  intravenously. Moderate Sedation Time: 65 minutes. The patient's level of consciousness and vital signs were monitored continuously by radiology nursing throughout the procedure under my direct supervision. CONTRAST:  75 ML OMNIPAQUE 300 FLUOROSCOPY TIME:  Radiation Exposure Index (as provided by the fluoroscopic device): 288 mGy Kerma COMPLICATIONS: None immediate. PROCEDURE: Informed consent was obtained from the patient following explanation of the procedure, risks, benefits and alternatives. The patient  understands, agrees and consents for the procedure. All questions were addressed. A time out was performed prior to the initiation of the procedure. Maximal barrier sterile technique utilized including caps, mask, sterile gowns, sterile gloves, large sterile drape, hand hygiene, and chlorhexidine prep. A time-out was performed prior to initiating the procedure. Ultrasound was used to confirm patency of the right common femoral artery. An ultrasound image was saved and recorded. Local anesthesia was provided with 1% lidocaine. Access of the right common femoral artery was performed under direct ultrasound guidance with a micropuncture set. A 5 French sheath was placed over a guidewire. A 5 [place] Kumpe the catheter was advanced into the right iliac artery. Contrast was injected and the catheter further advanced into the abdominal aorta over a hydrophilic guidewire. A 6 French, 45 cm sheath was then advanced into the abdominal aorta over a guidewire. The celiac axis was selectively catheterized with a 5 [place] Cobra catheter. Selective arteriography was performed. The gastroduodenal artery was then selectively catheterized with the 5 [place] Cobra catheter. Selective arteriography was performed of the gastroduodenal artery. A Medtronic MVP-[id number] microvascular plug was then advanced through the 5 French catheter and unsheathed at the level of the gastroduodenal artery. The plug was then deployed. Repeat arteriography was then performed through the 5 French catheter after waiting a few minutes. The 5 French catheter was removed. The 6 French sheath was retracted into the pelvis and oblique arteriography performed at the level of the right femoral access site. Arteriotomy closure was performed with the Angio-Seal device. FINDINGS: Celiac arteriography demonstrates tortuosity of the celiac bifurcation with patent splenic, common hepatic and left gastric arteries demonstrated. The gastroduodenal artery is visualized and  terminates in the gastroepiploic artery. The gastroduodenal artery was selectively catheterized with arteriography demonstrating normal patency. Coffey pseudoaneurysm or arterial contrast extravasation identified. The gastroduodenal artery was successfully occluded after deployment of a single microvascular plug resulting in complete occlusion of the artery. 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 Electronically Signed   By: Erica Hau M.D.   On: 02/05/2024 14:11   ECHOCARDIOGRAM COMPLETE Result Date: 02/05/2024    ECHOCARDIOGRAM REPORT   Patient Name:   Rock K Dombeck Date of Exam: 02/05/2024 Medical Rec #:  [id number]     Height:       69.0 in Accession #:    [id number]    Weight:       175.0 lb Date of Birth:  07-31-1956     BSA:          1.952 m Patient Age:    67 years      BP:           121/75 mmHg Patient Gender: M             HR:           79 bpm. Exam Location:  Inpatient Procedure: 2D Echo, Cardiac Doppler and Color Doppler (Both Spectral and Color  Flow Doppler were utilized during procedure). Indications:    Acute Myocardial Infarction  History:        Patient has prior history of Echocardiogram examinations, most                 recent 02/03/2017. CAD and Previous Myocardial Infarction, COPD;                 Risk Factors:Hypertension.  Sonographer:    Andrena Bang Referring Phys: [id number] Christena Covert IMPRESSIONS  1. Left ventricular ejection fraction, by estimation, is 60 to 65%. The left ventricle has normal function. The left ventricle demonstrates regional wall motion abnormalities (see scoring diagram/findings for description). Left ventricular diastolic parameters are indeterminate.  2. Right ventricular systolic function is normal. The right ventricular size is normal.  3. The mitral valve is normal in structure. Mild to moderate mitral valve regurgitation.  Coffey evidence of mitral stenosis.  4. The aortic valve has an indeterminant number of cusps. Aortic valve regurgitation is trivial. Aortic valve sclerosis/calcification is present, without any evidence of aortic stenosis.  5. The inferior vena cava is normal in size with greater than 50% respiratory variability, suggesting right atrial pressure of 3 mmHg. FINDINGS  Left Ventricle: Left ventricular ejection fraction, by estimation, is 60 to 65%. The left ventricle has normal function. The left ventricle demonstrates regional wall motion abnormalities. The left ventricular internal cavity size was normal in size. There is Coffey left ventricular hypertrophy. Left ventricular diastolic parameters are indeterminate.  LV Wall Scoring: The inferior wall is hypokinetic. The entire anterior wall, entire lateral wall, entire septum, and entire apex are normal. Right Ventricle: The right ventricular size is normal. Coffey increase in right ventricular wall thickness. Right ventricular systolic function is normal. Left Atrium: Left atrial size was normal in size. Right Atrium: Right atrial size was normal in size. Pericardium: There is Coffey evidence of pericardial effusion. Mitral Valve: The mitral valve is normal in structure. Mild to moderate mitral valve regurgitation. Coffey evidence of mitral valve stenosis. Tricuspid Valve: The tricuspid valve is normal in structure. Tricuspid valve regurgitation is not demonstrated. Coffey evidence of tricuspid stenosis. Aortic Valve: The aortic valve has an indeterminant number of cusps. Aortic valve regurgitation is trivial. Aortic valve sclerosis/calcification is present, without any evidence of aortic stenosis. Aortic valve mean gradient measures 4.0 mmHg. Aortic valve peak gradient measures 10.5 mmHg. Aortic valve area, by VTI measures 2.77 cm. Pulmonic Valve: The pulmonic valve was normal in structure. Pulmonic valve regurgitation is not visualized. Coffey evidence of pulmonic stenosis. Aorta: The  aortic root is normal in size and structure. Venous: The inferior vena cava is normal in size with greater than 50% respiratory variability, suggesting right atrial pressure of 3 mmHg. IAS/Shunts: Coffey atrial level shunt detected by color flow Doppler.  LEFT VENTRICLE PLAX 2D LVIDd:         4.60 cm      Diastology LVIDs:         3.40 cm      LV e' medial:    7.15 cm/s LV PW:         0.90 cm      LV E/e' medial:  10.3 LV IVS:        0.80 cm      LV e' lateral:   16.50 cm/s LVOT diam:     2.10 cm      LV E/e' lateral: 4.5 LV SV:         66 LV  SV Index:   34 LVOT Area:     3.46 cm  LV Volumes (MOD) LV vol d, MOD A2C: 120.0 ml LV vol d, MOD A4C: 110.0 ml LV vol s, MOD A2C: 45.9 ml LV vol s, MOD A4C: 31.4 ml LV SV MOD A2C:     74.1 ml LV SV MOD A4C:     110.0 ml LV SV MOD BP:      75.5 ml RIGHT VENTRICLE RV S prime:     14.80 cm/s TAPSE (M-mode): 1.8 cm LEFT ATRIUM             Index LA diam:        3.40 cm 1.74 cm/m LA Vol (A2C):   38.5 ml 19.72 ml/m LA Vol (A4C):   27.6 ml 14.14 ml/m LA Biplane Vol: 35.4 ml 18.13 ml/m  AORTIC VALVE AV Area (Vmax):    2.97 cm AV Area (Vmean):   2.73 cm AV Area (VTI):     2.77 cm AV Vmax:           162.00 cm/s AV Vmean:          92.200 cm/s AV VTI:            0.239 m AV Peak Grad:      10.5 mmHg AV Mean Grad:      4.0 mmHg LVOT Vmax:         139.00 cm/s LVOT Vmean:        72.600 cm/s LVOT VTI:          0.191 m LVOT/AV VTI ratio: 0.80 MITRAL VALVE MV Area (PHT): 2.31 cm    SHUNTS MV Decel Time: 328 msec    Systemic VTI:  0.19 m MR Peak grad: 125.4 mmHg   Systemic Diam: 2.10 cm MR Vmax:      560.00 cm/s MV E velocity: 73.70 cm/s MV A velocity: 66.00 cm/s MV E/A ratio:  1.12 Kardie Tobb DO Electronically signed by Jerryl Morin DO Signature Date/Time: 02/05/2024/11:25:16 AM    Final    CT ANGIO Coffey BLEED Result Date: 02/05/2024 CLINICAL DATA:  Rectal bleeding. Known duodenal ulcer. Acute mesenteric ischemia. EXAM: CTA ABDOMEN AND PELVIS WITHOUT AND WITH CONTRAST TECHNIQUE:  Multidetector CT imaging of the abdomen and pelvis was performed using the standard protocol during bolus administration of intravenous contrast. Multiplanar reconstructed images and MIPs were obtained and reviewed to evaluate the vascular anatomy. RADIATION DOSE REDUCTION: This exam was performed according to the departmental dose-optimization program which includes automated exposure control, adjustment of the mA and/or kV according to patient size and/or use of iterative reconstruction technique. CONTRAST:  75mL OMNIPAQUE IOHEXOL 350 MG/ML SOLN COMPARISON:  02/02/2024 FINDINGS: VASCULAR Aorta: Extensive aortic atherosclerosis. Again seen are postsurgical changes from infrarenal abdominal aortic aneurysm repair. Maximum caliber of the abdominal aorta is 3.5 cm, image 119/10. Unchanged from previous exam. Coffey signs of acute dissection. Coffey periaortic fluid or fat stranding. Celiac: Patent without evidence of aneurysm, dissection, vasculitis or significant stenosis. SMA: Patent without evidence of aneurysm, dissection, vasculitis or significant stenosis. Renals: Both renal arteries are patent without evidence of aneurysm, dissection, vasculitis, fibromuscular dysplasia or significant stenosis. IMA: Not visualized status post aortic repair. 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 On the right this measures 2.6 cm and on the left this measures 2.9 cm. Proximal Outflow: Bilateral common femoral and visualized portions of the superficial and profunda femoral arteries are patent without evidence of aneurysm, dissection, vasculitis or significant stenosis. Veins:  Coffey obvious venous abnormality within the limitations of this arterial phase study. Review of the MIP images confirms the above findings. NON-VASCULAR Lower chest: Coffey acute abnormality. Hepatobiliary: Coffey focal liver abnormality is seen. Coffey gallstones, gallbladder wall thickening, or biliary dilatation. Pancreas: Unremarkable. Coffey  pancreatic ductal dilatation or surrounding inflammatory changes. Spleen: Normal in size without focal abnormality. Adrenals/Urinary Tract: Normal adrenal glands. Coffey kidney mass or obstructive uropathy. 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 Urinary bladder appears normal. Stomach/Bowel: Stomach appears normal. Known descending duodenal ulceration is again noted. This measures 2.8 x 2.6 cm, image 85/10. There is surrounding soft tissue stranding as noted previously. On the precontrast images there is high density material identified within the pylorus and descending duodenum. This appears unchanged between the pre and postcontrast imaging without signs of active extravasation of IV contrast material within the lumen of the duodenum. Several foci of high attenuation material within the small bowel loops also noted which remains unchanged between the pre and postcontrast images. Coffey progressive areas of intraluminal contrast material identified to suggest active Coffey bleeding. Coffey pathologic dilatation of the large or small bowel loops. Coffey pneumatosis identified. Coffey signs of bowel ischemia. Lymphatic: Coffey signs of abdominopelvic adenopathy. Reproductive: Prostate is unremarkable. Other: Coffey significant free fluid or fluid collections. Coffey pneumoperitoneum. Musculoskeletal: Previous ORIF of the left proximal femur. Lumbar degenerative disc disease. Coffey acute or suspicious osseous findings. IMPRESSION: 1. Coffey signs of active Coffey bleeding. 2. Known descending duodenal ulcer with surrounding inflammatory changes. This measures 2.8 x 2.6 cm. 3. Coffey signs of bowel obstruction or ischemia. 4. Stable appearance of infrarenal abdominal aortic aneurysm repair. Maximum caliber of the abdominal aorta is 3.5 cm. 5. 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 Electronically Signed   By: Kimberley Penman M.D.   On: 02/05/2024 07:07   DG ABD ACUTE 2+V W 1V CHEST Result Date:  02/05/2024 CLINICAL DATA:  Abdomen pain EXAM: DG ABDOMEN ACUTE WITH 1 VIEW CHEST COMPARISON:  02/24/2022 FINDINGS: Single-view chest demonstrates Coffey acute airspace disease or effusion. Normal cardiac size. Coffey pneumothorax. Supine and upright views of the abdomen demonstrate a nonobstructed gas pattern. Coffey free air beneath the diaphragm. IMPRESSION: Coffey active disease. Nonobstructed gas pattern. Electronically Signed   By: Esmeralda Hedge M.D.   On: 02/05/2024 02:52   CT Angio Abd/Pel W and/or Wo Contrast Result Date: 02/02/2024 CLINICAL DATA:  Evaluate for lower Coffey bleed. EXAM: CTA ABDOMEN AND PELVIS WITHOUT AND WITH CONTRAST TECHNIQUE: Multidetector CT imaging of the abdomen and pelvis was performed using the standard protocol during bolus administration of intravenous contrast. Multiplanar reconstructed images and MIPs were obtained and reviewed to evaluate the vascular anatomy. RADIATION DOSE REDUCTION: This exam was performed according to the departmental dose-optimization program which includes automated exposure control, adjustment of the mA and/or kV according to patient size and/or use of iterative reconstruction technique. CONTRAST:  80mL OMNIPAQUE IOHEXOL 350 MG/ML SOLN COMPARISON:  None 02/12/2023 FINDINGS: VASCULAR Aorta: Extensive aortic atherosclerosis. Postsurgical changes from previous abdominal aortic aneurysm repair. The infrarenal abdominal aorta measures 3.5 by 3.5 cm, image 72/2. Previously 3.5 x 3.2 cm. Celiac: Patent without evidence of aneurysm, dissection, vasculitis or significant stenosis. SMA: Patent without evidence of aneurysm, dissection, vasculitis or significant stenosis. Renals: Both renal arteries are patent without evidence of aneurysm, dissection, vasculitis, fibromuscular dysplasia or significant stenosis. IMA: Not visualized. Inflow: Patent. Aneurysmal dilatation of the left common iliac artery measures 2.8 cm, image 97/12. Previously  2.7 cm. Right common iliac artery measures  2.6 cm, image 97/12. Previously 2.4 cm. Proximal Outflow: Bilateral common femoral and visualized portions of the superficial and profunda femoral arteries are patent without evidence of aneurysm, dissection, vasculitis or significant stenosis. Veins: Coffey obvious venous abnormality within the limitations of this arterial phase study. Review of the MIP images confirms the above findings. NON-VASCULAR Lower chest: Coffey acute abnormality. Hepatobiliary: Coffey focal liver abnormality is seen. Coffey gallstones, gallbladder wall thickening, or biliary dilatation. Pancreas: Unremarkable. Coffey pancreatic ductal dilatation or surrounding inflammatory changes. Spleen: Normal in size without focal abnormality. Adrenals/Urinary Tract: Normal adrenal glands. Coffey kidney stones or signs of obstructive uropathy. Bilateral renal calcifications are likely vascular nature. Bilateral Bosniak class 1 kidney cysts are noted. The largest is in the left kidney measuring 2 cm. Coffey follow-up imaging recommended. Urinary bladder is within normal limits. Stomach/Bowel: Stomach appears normal. Transmural ulceration along the posterior wall of the descending duodenum with surrounding soft tissue stranding, image 66/22 and image 55/12. Ulcer crater measures approximately 2.9 cm. Coffey extraluminal gas identified to suggest free perforation. Coffey signs abnormal intraluminal contrast extravasation to suggest active Coffey bleeding. Coffey pathologic dilatation of the large or small bowel loops. Lymphatic: Coffey signs of abdominopelvic adenopathy. Reproductive: Mild prostate gland enlargement with nodular thickening indenting the bladder base. Other: Coffey significant free fluid or fluid collections. Coffey signs of pneumoperitoneum. Musculoskeletal: Previous ORIF of the left femur. Coffey acute or suspicious osseous findings. First degree anterolisthesis of L3 on L4. Degenerative disc disease noted at L3-4, L4-5 and L5-S1. IMPRESSION: 1. Transmural ulceration along the posterior wall of  the descending duodenum with surrounding soft tissue stranding. Coffey extraluminal gas identified to suggest free perforation. Cannot exclude contained duodenal ulcer perforation. 2. Coffey signs of active Coffey bleeding. 3. Postsurgical changes from previous abdominal aortic aneurysm repair. Infrarenal abdominal aortic aneurysm measures 3.5 x 3.5 cm. Previously 3.5 x 3.2 cm. 4. 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 Previously 2.7 cm and 2.4 cm respectively. 5.  Aortic Atherosclerosis (ICD10-I70.0). Electronically Signed   By: Kimberley Penman M.D.   On: 02/02/2024 10:12    Microbiology: Coffey results found for this or any previous visit (from the past 240 hours).   Labs: Basic Metabolic Panel: Recent Labs  Lab 02/04/24 2333 02/05/24 0836 02/06/24 0356 02/07/24 0052 02/08/24 0412  NA 135 140 136 137 137  K 2.9* 3.4* 3.6 3.4* 3.9  CL 104 106 108 104 105  CO2 19* 23 21* 23 25  GLUCOSE 111* 105* 108* 111* 106*  BUN 5* <5* 5* 5* 6*  CREATININE 0.69 0.71 0.66 0.74 0.72  CALCIUM 8.1* 8.2* 8.3* 8.3* 8.8*  MG  --  1.9 2.1  --   --    Liver Function Tests: Recent Labs  Lab 02/02/24 0636  AST 30  ALT 15  ALKPHOS 55  BILITOT 0.3  PROT 5.5*  ALBUMIN 2.8*   Coffey results for input(s): "LIPASE", "AMYLASE" in the last 168 hours. Coffey results for input(s): "AMMONIA" in the last 168 hours. CBC: Recent Labs  Lab 02/02/24 0636 02/02/24 1748 02/04/24 2333 02/05/24 0836 02/05/24 1015 02/06/24 0356 02/06/24 0920 02/07/24 0052 02/07/24 1250 02/07/24 1754 02/08/24 0412 02/08/24 1308  WBC 13.1*   < > 10.3 9.4  --  10.2  --  10.5  --   --  11.2*  --   NEUTROABS 10.6*  --   --  7.3  --   --   --   --   --   --   --   --   HGB 7.3*   < > 8.1* 9.2*   < > 7.6*  7.5*   < > 10.1* 10.8* 10.6* 11.1* 12.5*  HCT 22.2*   < > 23.6* 26.9*   < > 22.3*  22.3*   < > 28.5* 31.5* 31.1* 32.7* 38.3*  MCV 91.4   < > 88.1 88.5  --  88.1  --  86.4   --   --  88.4  --   PLT 482*   < > 408* 430*  --  381  --  343  --   --  435*  --    < > = values in this interval not displayed.   Cardiac Enzymes: Coffey results for input(s): "CKTOTAL", "CKMB", "CKMBINDEX", "TROPONINI" in the last 168 hours. BNP: BNP (last 3 results) Coffey results for input(s): "BNP" in the last 8760 hours.  ProBNP (last 3 results) Coffey results for input(s): "PROBNP" in the last 8760 hours.  CBG: Coffey results for input(s): "GLUCAP" in the last 168 hours.     Signed:  Hilda Lovings MD.  [facility] 02/08/2024, 2:59 PM

## 2024-03-05 ENCOUNTER — Ambulatory Visit: Admitting: Physician Assistant

## 2024-04-22 ENCOUNTER — Ambulatory Visit: Admitting: Physician Assistant

## 2024-06-01 ENCOUNTER — Ambulatory Visit: Admitting: Physician Assistant

## 2024-07-20 ENCOUNTER — Emergency Department (HOSPITAL_COMMUNITY)

## 2024-07-20 ENCOUNTER — Emergency Department (HOSPITAL_COMMUNITY)
Admission: EM | Admit: 2024-07-20 | Discharge: 2024-07-21 | Disposition: A | Discharge status: Home / Self Care | Attending: Emergency Medicine | Admitting: Emergency Medicine

## 2024-07-20 ENCOUNTER — Other Ambulatory Visit: Payer: Self-pay

## 2024-07-20 ENCOUNTER — Encounter (HOSPITAL_COMMUNITY): Payer: Self-pay

## 2024-07-20 DIAGNOSIS — J449 Chronic obstructive pulmonary disease, unspecified: Secondary | ICD-10-CM | POA: Insufficient documentation

## 2024-07-20 DIAGNOSIS — I2510 Atherosclerotic heart disease of native coronary artery without angina pectoris: Secondary | ICD-10-CM | POA: Diagnosis not present

## 2024-07-20 DIAGNOSIS — I10 Essential (primary) hypertension: Secondary | ICD-10-CM | POA: Insufficient documentation

## 2024-07-20 DIAGNOSIS — K269 Duodenal ulcer, unspecified as acute or chronic, without hemorrhage or perforation: Secondary | ICD-10-CM | POA: Diagnosis not present

## 2024-07-20 DIAGNOSIS — R109 Unspecified abdominal pain: Secondary | ICD-10-CM | POA: Diagnosis present

## 2024-07-20 MED ORDER — SODIUM CHLORIDE 0.9 % IV BOLUS
1000.0000 mL | Freq: Once | INTRAVENOUS | Status: AC
  Administered 2024-07-20: 1000 mL via INTRAVENOUS

## 2024-07-20 MED ORDER — MORPHINE SULFATE (PF) 4 MG/ML IV SOLN
4.0000 mg | Freq: Once | INTRAVENOUS | Status: AC
  Administered 2024-07-20: 4 mg via INTRAVENOUS
  Filled 2024-07-20: qty 1

## 2024-07-20 MED ORDER — ONDANSETRON HCL 4 MG/2ML IJ SOLN
4.0000 mg | Freq: Once | INTRAMUSCULAR | Status: AC
  Administered 2024-07-20: 4 mg via INTRAVENOUS
  Filled 2024-07-20: qty 2

## 2024-07-20 NOTE — ED Triage Notes (Signed)
 Pt via [facility] c/o severe abdominal pain that started x2 days ago. Pt with hx of aortic aneurysm (ruptured) and pt states that it feels similar. Pt has not been able urinate or have BM's over the past day or two. Pain 10/10. Hypertensive.

## 2024-07-20 NOTE — ED Provider Notes (Signed)
 [place] EMERGENCY DEPARTMENT AT [facility] Provider Note   CSN: [id number] Arrival date & time: 07/20/24  2249     Patient presents with: No chief complaint on file.   KOLE HILYARD is a 68 y.o. male.  {Add pertinent medical, surgical, social history, OB history to HPI:32947} HPI     This is a 68 year old male with a history of ruptured AAA status postrepair as well as duodenal ulcer who presents with abdominal pain.  Patient reports 2 to 3-day history of worsening abdominal pain.  He states that it is in his mid abdomen and radiates to his back.  Reports difficulty having a bowel movement and urinating.  Has not take anything for the pain at home.  No nausea or vomiting.  States that this feels worse than when he had his ruptured AAA.  Of note, was noted to have an acute GI bleed related to a duodenal ulcer in April of this year.  Prior to Admission medications   Medication Sig Start Date End Date Taking? Authorizing Provider  acetaminophen  (TYLENOL ) 500 MG tablet Take 1 tablet (500 mg total) by mouth every 6 (six) hours as needed for headache (pain). 02/04/17   Henry Manuelita NOVAK, NP  atorvastatin  (LIPITOR ) 80 MG tablet Take 1 tablet (80 mg total) by mouth daily. 02/09/24   Sebastian Toribio GAILS, MD  nicotine  (NICODERM CQ  - DOSED IN MG/24 HOURS) 21 mg/24hr patch Place 1 patch (21 mg total) onto the skin daily. 02/09/24   Sebastian Toribio GAILS, MD  nitroGLYCERIN  (NITROSTAT ) 0.4 MG SL tablet Place 1 tablet (0.4 mg total) under the tongue every 5 (five) minutes as needed for chest pain. 02/08/24   Sebastian Toribio GAILS, MD  pantoprazole  (PROTONIX ) 40 MG tablet Take 1 tablet (40 mg total) by mouth 2 (two) times daily before a meal. 02/08/24   Sebastian Toribio GAILS, MD    Allergies: Patient has no known allergies.    Review of Systems  Constitutional:  Negative for fever.  Respiratory:  Negative for shortness of breath.   Cardiovascular:  Negative for chest pain.  Gastrointestinal:   Positive for abdominal pain. Negative for diarrhea, nausea and vomiting.  Genitourinary:  Positive for difficulty urinating.  All other systems reviewed and are negative.   Updated Vital Signs BP (!) 182/103   Pulse 70   Temp 97.9 F (36.6 C) (Oral)   Resp 18   Ht 1.753 m (5' 9)   Wt 79.5 kg   SpO2 94%   BMI 25.88 kg/m   Physical Exam Vitals and nursing note reviewed.  Constitutional:      Appearance: He is well-developed. He is ill-appearing. He is not toxic-appearing.  HENT:     Head: Normocephalic and atraumatic.  Eyes:     Pupils: Pupils are equal, round, and reactive to light.  Cardiovascular:     Rate and Rhythm: Normal rate and regular rhythm.     Heart sounds: Normal heart sounds. No murmur heard. Pulmonary:     Effort: Pulmonary effort is normal. No respiratory distress.     Breath sounds: Normal breath sounds. No wheezing.  Abdominal:     General: Bowel sounds are normal.     Palpations: Abdomen is soft.     Tenderness: There is abdominal tenderness. There is no rebound.     Comments: Generalized tenderness to palpation without rebound or guarding  Musculoskeletal:     Cervical back: Neck supple.  Lymphadenopathy:     Cervical: No cervical  adenopathy.  Skin:    General: Skin is warm and dry.  Neurological:     Mental Status: He is alert and oriented to person, place, and time.  Psychiatric:        Mood and Affect: Mood normal.     (all labs ordered are listed, but only abnormal results are displayed) Labs Reviewed  CBC WITH DIFFERENTIAL/PLATELET  COMPREHENSIVE METABOLIC PANEL WITH GFR  LIPASE, BLOOD    EKG: None  Radiology: No results found.  {Document cardiac monitor, telemetry assessment procedure when appropriate:32947} Procedures   Medications Ordered in the ED  morphine  (PF) 4 MG/ML injection 4 mg (has no administration in time range)  ondansetron  (ZOFRAN ) injection 4 mg (has no administration in time range)  sodium chloride  0.9 %  bolus 1,000 mL (has no administration in time range)      {Click here for ABCD2, HEART and other calculators REFRESH Note before signing:1}                              Medical Decision Making Amount and/or Complexity of Data Reviewed Labs: ordered. Radiology: ordered.  Risk Prescription drug management.   ***  {Document critical care time when appropriate  Document review of labs and clinical decision tools ie CHADS2VASC2, etc  Document your independent review of radiology images and any outside records  Document your discussion with family members, caretakers and with consultants  Document social determinants of health affecting pt's care  Document your decision making why or why not admission, treatments were needed:32947:::1}   Final diagnoses:  None    ED Discharge Orders     None

## 2024-07-21 DIAGNOSIS — K269 Duodenal ulcer, unspecified as acute or chronic, without hemorrhage or perforation: Secondary | ICD-10-CM | POA: Diagnosis not present

## 2024-07-21 LAB — URINALYSIS, ROUTINE W REFLEX MICROSCOPIC
Bacteria, UA: NONE SEEN
Bilirubin Urine: NEGATIVE
Glucose, UA: NEGATIVE mg/dL
Ketones, ur: NEGATIVE mg/dL
Leukocytes,Ua: NEGATIVE
Nitrite: NEGATIVE
Protein, ur: NEGATIVE mg/dL
Specific Gravity, Urine: 1.044 — ABNORMAL HIGH (ref 1.005–1.030)
pH: 6 (ref 5.0–8.0)

## 2024-07-21 LAB — COMPREHENSIVE METABOLIC PANEL WITH GFR
ALT: 11 U/L (ref 0–44)
AST: 14 U/L — ABNORMAL LOW (ref 15–41)
Albumin: 3.9 g/dL (ref 3.5–5.0)
Alkaline Phosphatase: 91 U/L (ref 38–126)
Anion gap: 12 (ref 5–15)
BUN: 11 mg/dL (ref 8–23)
CO2: 24 mmol/L (ref 22–32)
Calcium: 9.2 mg/dL (ref 8.9–10.3)
Chloride: 101 mmol/L (ref 98–111)
Creatinine, Ser: 0.91 mg/dL (ref 0.61–1.24)
GFR, Estimated: >60 mL/min (ref >60)
Glucose, Bld: 129 mg/dL — ABNORMAL HIGH (ref 70–99)
Potassium: 3.9 mmol/L (ref 3.5–5.1)
Sodium: 137 mmol/L (ref 135–145)
Total Bilirubin: 0.6 mg/dL (ref 0.0–1.2)
Total Protein: 7.1 g/dL (ref 6.5–8.1)

## 2024-07-21 LAB — CBC WITH DIFFERENTIAL/PLATELET
Abs Immature Granulocytes: 0.04 K/uL (ref 0.00–0.07)
Basophils Absolute: 0.1 K/uL (ref 0.0–0.1)
Basophils Relative: 1 %
Eosinophils Absolute: 0.3 K/uL (ref 0.0–0.5)
Eosinophils Relative: 3 %
HCT: 45.4 % (ref 39.0–52.0)
Hemoglobin: 15.5 g/dL (ref 13.0–17.0)
Immature Granulocytes: 0 %
Lymphocytes Relative: 16 %
Lymphs Abs: 1.5 K/uL (ref 0.7–4.0)
MCH: 30.4 pg (ref 26.0–34.0)
MCHC: 34.1 g/dL (ref 30.0–36.0)
MCV: 89 fL (ref 80.0–100.0)
Monocytes Absolute: 0.4 K/uL (ref 0.1–1.0)
Monocytes Relative: 5 %
Neutro Abs: 7.2 K/uL (ref 1.7–7.7)
Neutrophils Relative %: 75 %
Platelets: 322 K/uL (ref 150–400)
RBC: 5.1 MIL/uL (ref 4.22–5.81)
RDW: 14.5 % (ref 11.5–15.5)
WBC: 9.5 K/uL (ref 4.0–10.5)
nRBC: 0 % (ref 0.0–0.2)

## 2024-07-21 LAB — LIPASE, BLOOD: Lipase: 48 U/L (ref 11–51)

## 2024-07-21 MED ORDER — SUCRALFATE 1 GM/10ML PO SUSP
1.0000 g | Freq: Three times a day (TID) | ORAL | Status: DC
  Administered 2024-07-21: 1 g via ORAL
  Filled 2024-07-21: qty 10

## 2024-07-21 MED ORDER — MORPHINE SULFATE (PF) 4 MG/ML IV SOLN
4.0000 mg | Freq: Once | INTRAVENOUS | Status: AC
  Administered 2024-07-21: 4 mg via INTRAVENOUS
  Filled 2024-07-21: qty 1

## 2024-07-21 MED ORDER — SUCRALFATE 1 G PO TABS: 1.0000 g | ORAL_TABLET | Freq: Three times a day (TID) | ORAL | 0 refills | Status: DC

## 2024-07-21 MED ORDER — PANTOPRAZOLE SODIUM 40 MG IV SOLR
40.0000 mg | Freq: Once | INTRAVENOUS | Status: AC
  Administered 2024-07-21: 40 mg via INTRAVENOUS
  Filled 2024-07-21: qty 10

## 2024-07-21 MED ORDER — IOHEXOL 350 MG/ML SOLN
100.0000 mL | Freq: Once | INTRAVENOUS | Status: AC | PRN
  Administered 2024-07-21: 100 mL via INTRAVENOUS

## 2024-07-21 NOTE — Discharge Instructions (Signed)
 You were seen today for abdominal pain.  This may be related to your known duodenal ulcer.  Avoid anti-inflammatory medications.  Make sure that you are taking her Protonix  and add Carafate .  Follow-up with your gastroenterologist.  If you develop any new or worsening symptoms, you should be reevaluated immediately.

## 2024-07-21 NOTE — ED Notes (Signed)
 Pt given cup of water

## 2024-07-21 NOTE — ED Notes (Signed)
 Patient transported to CT

## 2024-07-27 DIAGNOSIS — K602 Anal fissure, unspecified: Secondary | ICD-10-CM | POA: Diagnosis present

## 2024-07-27 DIAGNOSIS — I10 Essential (primary) hypertension: Secondary | ICD-10-CM | POA: Diagnosis present

## 2024-07-27 DIAGNOSIS — K311 Adult hypertrophic pyloric stenosis: Secondary | ICD-10-CM | POA: Diagnosis present

## 2024-07-27 DIAGNOSIS — I252 Old myocardial infarction: Secondary | ICD-10-CM

## 2024-07-27 DIAGNOSIS — Z716 Tobacco abuse counseling: Secondary | ICD-10-CM

## 2024-07-27 DIAGNOSIS — F17210 Nicotine dependence, cigarettes, uncomplicated: Secondary | ICD-10-CM | POA: Diagnosis present

## 2024-07-27 DIAGNOSIS — K267 Chronic duodenal ulcer without hemorrhage or perforation: Principal | ICD-10-CM | POA: Diagnosis present

## 2024-07-27 DIAGNOSIS — Z8719 Personal history of other diseases of the digestive system: Secondary | ICD-10-CM

## 2024-07-27 DIAGNOSIS — K2950 Unspecified chronic gastritis without bleeding: Secondary | ICD-10-CM | POA: Diagnosis present

## 2024-07-27 DIAGNOSIS — Z818 Family history of other mental and behavioral disorders: Secondary | ICD-10-CM

## 2024-07-27 DIAGNOSIS — G8929 Other chronic pain: Secondary | ICD-10-CM | POA: Diagnosis present

## 2024-07-27 DIAGNOSIS — K5712 Diverticulitis of small intestine without perforation or abscess without bleeding: Secondary | ICD-10-CM | POA: Diagnosis present

## 2024-07-27 DIAGNOSIS — Z955 Presence of coronary angioplasty implant and graft: Secondary | ICD-10-CM

## 2024-07-27 DIAGNOSIS — Z8679 Personal history of other diseases of the circulatory system: Secondary | ICD-10-CM

## 2024-07-27 DIAGNOSIS — I2510 Atherosclerotic heart disease of native coronary artery without angina pectoris: Secondary | ICD-10-CM | POA: Diagnosis present

## 2024-07-27 DIAGNOSIS — K5900 Constipation, unspecified: Secondary | ICD-10-CM | POA: Diagnosis present

## 2024-07-27 DIAGNOSIS — K2980 Duodenitis without bleeding: Secondary | ICD-10-CM | POA: Diagnosis present

## 2024-07-27 DIAGNOSIS — K315 Obstruction of duodenum: Secondary | ICD-10-CM | POA: Diagnosis present

## 2024-07-27 DIAGNOSIS — F419 Anxiety disorder, unspecified: Secondary | ICD-10-CM | POA: Diagnosis present

## 2024-07-27 DIAGNOSIS — J449 Chronic obstructive pulmonary disease, unspecified: Secondary | ICD-10-CM | POA: Diagnosis present

## 2024-07-27 DIAGNOSIS — K5792 Diverticulitis of intestine, part unspecified, without perforation or abscess without bleeding: Secondary | ICD-10-CM | POA: Diagnosis not present

## 2024-07-27 DIAGNOSIS — E876 Hypokalemia: Secondary | ICD-10-CM | POA: Diagnosis present

## 2024-07-27 DIAGNOSIS — K644 Residual hemorrhoidal skin tags: Secondary | ICD-10-CM | POA: Diagnosis present

## 2024-07-27 DIAGNOSIS — Z604 Social exclusion and rejection: Secondary | ICD-10-CM | POA: Diagnosis present

## 2024-07-27 DIAGNOSIS — K760 Fatty (change of) liver, not elsewhere classified: Secondary | ICD-10-CM | POA: Diagnosis present

## 2024-07-27 DIAGNOSIS — E785 Hyperlipidemia, unspecified: Secondary | ICD-10-CM | POA: Diagnosis present

## 2024-07-27 DIAGNOSIS — I723 Aneurysm of iliac artery: Secondary | ICD-10-CM | POA: Diagnosis present

## 2024-07-28 ENCOUNTER — Inpatient Hospital Stay (HOSPITAL_COMMUNITY)
Admission: EM | Admit: 2024-07-28 | Discharge: 2024-07-31 | DRG: 384 | Disposition: A | Discharge status: Home / Self Care

## 2024-07-28 ENCOUNTER — Emergency Department (HOSPITAL_COMMUNITY)

## 2024-07-28 ENCOUNTER — Encounter (HOSPITAL_COMMUNITY): Payer: Self-pay | Admitting: Emergency Medicine

## 2024-07-28 ENCOUNTER — Other Ambulatory Visit: Payer: Self-pay

## 2024-07-28 DIAGNOSIS — K311 Adult hypertrophic pyloric stenosis: Secondary | ICD-10-CM | POA: Diagnosis present

## 2024-07-28 DIAGNOSIS — E876 Hypokalemia: Secondary | ICD-10-CM | POA: Diagnosis present

## 2024-07-28 DIAGNOSIS — K269 Duodenal ulcer, unspecified as acute or chronic, without hemorrhage or perforation: Secondary | ICD-10-CM | POA: Diagnosis not present

## 2024-07-28 DIAGNOSIS — K2980 Duodenitis without bleeding: Secondary | ICD-10-CM | POA: Diagnosis present

## 2024-07-28 DIAGNOSIS — G8929 Other chronic pain: Secondary | ICD-10-CM

## 2024-07-28 DIAGNOSIS — K2950 Unspecified chronic gastritis without bleeding: Secondary | ICD-10-CM | POA: Diagnosis present

## 2024-07-28 DIAGNOSIS — K760 Fatty (change of) liver, not elsewhere classified: Secondary | ICD-10-CM | POA: Diagnosis present

## 2024-07-28 DIAGNOSIS — F17210 Nicotine dependence, cigarettes, uncomplicated: Secondary | ICD-10-CM | POA: Diagnosis present

## 2024-07-28 DIAGNOSIS — F419 Anxiety disorder, unspecified: Secondary | ICD-10-CM | POA: Diagnosis present

## 2024-07-28 DIAGNOSIS — Z818 Family history of other mental and behavioral disorders: Secondary | ICD-10-CM | POA: Diagnosis not present

## 2024-07-28 DIAGNOSIS — R1013 Epigastric pain: Secondary | ICD-10-CM | POA: Diagnosis not present

## 2024-07-28 DIAGNOSIS — K315 Obstruction of duodenum: Secondary | ICD-10-CM | POA: Diagnosis present

## 2024-07-28 DIAGNOSIS — J449 Chronic obstructive pulmonary disease, unspecified: Secondary | ICD-10-CM | POA: Diagnosis present

## 2024-07-28 DIAGNOSIS — K602 Anal fissure, unspecified: Secondary | ICD-10-CM

## 2024-07-28 DIAGNOSIS — K5792 Diverticulitis of intestine, part unspecified, without perforation or abscess without bleeding: Secondary | ICD-10-CM | POA: Diagnosis present

## 2024-07-28 DIAGNOSIS — K6289 Other specified diseases of anus and rectum: Secondary | ICD-10-CM

## 2024-07-28 DIAGNOSIS — I723 Aneurysm of iliac artery: Secondary | ICD-10-CM | POA: Diagnosis present

## 2024-07-28 DIAGNOSIS — Z604 Social exclusion and rejection: Secondary | ICD-10-CM | POA: Diagnosis present

## 2024-07-28 DIAGNOSIS — I10 Essential (primary) hypertension: Secondary | ICD-10-CM | POA: Diagnosis present

## 2024-07-28 DIAGNOSIS — Z716 Tobacco abuse counseling: Secondary | ICD-10-CM | POA: Diagnosis not present

## 2024-07-28 DIAGNOSIS — K5712 Diverticulitis of small intestine without perforation or abscess without bleeding: Secondary | ICD-10-CM | POA: Diagnosis present

## 2024-07-28 DIAGNOSIS — K644 Residual hemorrhoidal skin tags: Secondary | ICD-10-CM | POA: Diagnosis present

## 2024-07-28 DIAGNOSIS — E785 Hyperlipidemia, unspecified: Secondary | ICD-10-CM | POA: Diagnosis present

## 2024-07-28 DIAGNOSIS — K267 Chronic duodenal ulcer without hemorrhage or perforation: Secondary | ICD-10-CM | POA: Diagnosis present

## 2024-07-28 DIAGNOSIS — I2510 Atherosclerotic heart disease of native coronary artery without angina pectoris: Secondary | ICD-10-CM | POA: Diagnosis present

## 2024-07-28 DIAGNOSIS — I252 Old myocardial infarction: Secondary | ICD-10-CM | POA: Diagnosis not present

## 2024-07-28 DIAGNOSIS — K5900 Constipation, unspecified: Secondary | ICD-10-CM | POA: Diagnosis present

## 2024-07-28 DIAGNOSIS — Z955 Presence of coronary angioplasty implant and graft: Secondary | ICD-10-CM | POA: Diagnosis not present

## 2024-07-28 LAB — MAGNESIUM: Magnesium: 1.8 mg/dL (ref 1.7–2.4)

## 2024-07-28 LAB — CBC
HCT: 45.3 % (ref 39.0–52.0)
Hemoglobin: 15.2 g/dL (ref 13.0–17.0)
MCH: 29.7 pg (ref 26.0–34.0)
MCHC: 33.6 g/dL (ref 30.0–36.0)
MCV: 88.5 fL (ref 80.0–100.0)
Platelets: 426 K/uL — ABNORMAL HIGH (ref 150–400)
RBC: 5.12 MIL/uL (ref 4.22–5.81)
RDW: 13.7 % (ref 11.5–15.5)
WBC: 11.7 K/uL — ABNORMAL HIGH (ref 4.0–10.5)
nRBC: 0 % (ref 0.0–0.2)

## 2024-07-28 LAB — URINALYSIS, ROUTINE W REFLEX MICROSCOPIC
Bacteria, UA: NONE SEEN
Bilirubin Urine: NEGATIVE
Glucose, UA: NEGATIVE mg/dL
Ketones, ur: NEGATIVE mg/dL
Leukocytes,Ua: NEGATIVE
Nitrite: NEGATIVE
Protein, ur: NEGATIVE mg/dL
Specific Gravity, Urine: 1.014 (ref 1.005–1.030)
pH: 6 (ref 5.0–8.0)

## 2024-07-28 LAB — COMPREHENSIVE METABOLIC PANEL WITH GFR
ALT: 12 U/L (ref 0–44)
AST: 16 U/L (ref 15–41)
Albumin: 3.9 g/dL (ref 3.5–5.0)
Alkaline Phosphatase: 77 U/L (ref 38–126)
Anion gap: 11 (ref 5–15)
BUN: 8 mg/dL (ref 8–23)
CO2: 24 mmol/L (ref 22–32)
Calcium: 9.4 mg/dL (ref 8.9–10.3)
Chloride: 99 mmol/L (ref 98–111)
Creatinine, Ser: 0.91 mg/dL (ref 0.61–1.24)
GFR, Estimated: >60 mL/min (ref >60)
Glucose, Bld: 121 mg/dL — ABNORMAL HIGH (ref 70–99)
Potassium: 2.7 mmol/L — CL (ref 3.5–5.1)
Sodium: 134 mmol/L — ABNORMAL LOW (ref 135–145)
Total Bilirubin: 0.6 mg/dL (ref 0.0–1.2)
Total Protein: 7.1 g/dL (ref 6.5–8.1)

## 2024-07-28 LAB — TROPONIN I (HIGH SENSITIVITY)
Troponin I (High Sensitivity): 19 ng/L — ABNORMAL HIGH (ref <18)
Troponin I (High Sensitivity): 17 ng/L (ref <18)

## 2024-07-28 LAB — BASIC METABOLIC PANEL WITH GFR
Anion gap: 11 (ref 5–15)
BUN: 8 mg/dL (ref 8–23)
CO2: 22 mmol/L (ref 22–32)
Calcium: 8.7 mg/dL — ABNORMAL LOW (ref 8.9–10.3)
Chloride: 104 mmol/L (ref 98–111)
Creatinine, Ser: 0.75 mg/dL (ref 0.61–1.24)
GFR, Estimated: >60 mL/min (ref >60)
Glucose, Bld: 95 mg/dL (ref 70–99)
Potassium: 3.3 mmol/L — ABNORMAL LOW (ref 3.5–5.1)
Sodium: 137 mmol/L (ref 135–145)

## 2024-07-28 LAB — LIPASE, BLOOD
Lipase: 19 U/L (ref 11–51)
Lipase: 21 U/L (ref 11–51)

## 2024-07-28 MED ORDER — MORPHINE SULFATE (PF) 2 MG/ML IV SOLN
1.0000 mg | Freq: Once | INTRAVENOUS | Status: AC
  Administered 2024-07-28: 1 mg via INTRAVENOUS
  Filled 2024-07-28: qty 1

## 2024-07-28 MED ORDER — POTASSIUM CHLORIDE 10 MEQ/100ML IV SOLN
10.0000 meq | INTRAVENOUS | Status: AC
  Administered 2024-07-28 (×2): 10 meq via INTRAVENOUS
  Filled 2024-07-28 (×2): qty 100

## 2024-07-28 MED ORDER — NICOTINE 21 MG/24HR TD PT24
21.0000 mg | MEDICATED_PATCH | Freq: Every day | TRANSDERMAL | Status: DC
  Administered 2024-07-28 – 2024-07-31 (×4): 21 mg via TRANSDERMAL
  Filled 2024-07-28 (×4): qty 1

## 2024-07-28 MED ORDER — MAGNESIUM SULFATE 2 GM/50ML IV SOLN
2.0000 g | Freq: Once | INTRAVENOUS | Status: AC
  Administered 2024-07-28: 2 g via INTRAVENOUS
  Filled 2024-07-28: qty 50

## 2024-07-28 MED ORDER — POTASSIUM CHLORIDE IN NACL 40-0.9 MEQ/L-% IV SOLN
INTRAVENOUS | Status: AC
  Filled 2024-07-28 (×2): qty 1000

## 2024-07-28 MED ORDER — HYDROMORPHONE HCL 1 MG/ML IJ SOLN
1.0000 mg | Freq: Once | INTRAMUSCULAR | Status: AC
  Administered 2024-07-28: 1 mg via INTRAVENOUS
  Filled 2024-07-28: qty 1

## 2024-07-28 MED ORDER — METRONIDAZOLE 500 MG/100ML IV SOLN
500.0000 mg | Freq: Once | INTRAVENOUS | Status: AC
  Administered 2024-07-28: 500 mg via INTRAVENOUS
  Filled 2024-07-28: qty 100

## 2024-07-28 MED ORDER — PANTOPRAZOLE SODIUM 40 MG IV SOLR
40.0000 mg | Freq: Two times a day (BID) | INTRAVENOUS | Status: DC
  Administered 2024-07-28 – 2024-07-30 (×6): 40 mg via INTRAVENOUS
  Filled 2024-07-28 (×6): qty 10

## 2024-07-28 MED ORDER — METRONIDAZOLE 500 MG/100ML IV SOLN
500.0000 mg | Freq: Two times a day (BID) | INTRAVENOUS | Status: AC
  Administered 2024-07-28 – 2024-07-30 (×4): 500 mg via INTRAVENOUS
  Filled 2024-07-28 (×4): qty 100

## 2024-07-28 MED ORDER — ONDANSETRON HCL 4 MG/2ML IJ SOLN
4.0000 mg | Freq: Four times a day (QID) | INTRAMUSCULAR | Status: DC | PRN
  Administered 2024-07-31: 4 mg via INTRAVENOUS
  Filled 2024-07-28: qty 2

## 2024-07-28 MED ORDER — POTASSIUM CHLORIDE 20 MEQ PO PACK
80.0000 meq | PACK | Freq: Every day | ORAL | Status: DC
  Administered 2024-07-28: 80 meq via ORAL
  Filled 2024-07-28 (×2): qty 4

## 2024-07-28 MED ORDER — IOHEXOL 350 MG/ML SOLN
75.0000 mL | Freq: Once | INTRAVENOUS | Status: AC | PRN
  Administered 2024-07-28: 75 mL via INTRAVENOUS

## 2024-07-28 MED ORDER — MORPHINE SULFATE (PF) 4 MG/ML IV SOLN
4.0000 mg | Freq: Once | INTRAVENOUS | Status: AC
  Administered 2024-07-28: 4 mg via INTRAVENOUS
  Filled 2024-07-28: qty 1

## 2024-07-28 MED ORDER — SODIUM CHLORIDE 0.9 % IV SOLN
2.0000 g | Freq: Once | INTRAVENOUS | Status: AC
  Administered 2024-07-28: 2 g via INTRAVENOUS
  Filled 2024-07-28: qty 20

## 2024-07-28 MED ORDER — PANTOPRAZOLE SODIUM 40 MG IV SOLR
40.0000 mg | Freq: Once | INTRAVENOUS | Status: AC
  Administered 2024-07-28: 40 mg via INTRAVENOUS
  Filled 2024-07-28: qty 10

## 2024-07-28 MED ORDER — ACETAMINOPHEN 500 MG PO TABS
500.0000 mg | ORAL_TABLET | Freq: Four times a day (QID) | ORAL | Status: DC | PRN
  Administered 2024-07-29: 500 mg via ORAL
  Filled 2024-07-28: qty 1

## 2024-07-28 MED ORDER — ONDANSETRON HCL 4 MG/2ML IJ SOLN
4.0000 mg | Freq: Once | INTRAMUSCULAR | Status: AC
  Administered 2024-07-28: 4 mg via INTRAVENOUS
  Filled 2024-07-28: qty 2

## 2024-07-28 MED ORDER — MORPHINE SULFATE (PF) 2 MG/ML IV SOLN
2.0000 mg | INTRAVENOUS | Status: DC | PRN
  Administered 2024-07-28 – 2024-07-29 (×5): 2 mg via INTRAVENOUS
  Filled 2024-07-28 (×6): qty 1

## 2024-07-28 MED ORDER — SUCRALFATE 1 G PO TABS
1.0000 g | ORAL_TABLET | Freq: Three times a day (TID) | ORAL | Status: DC
  Administered 2024-07-28 – 2024-07-31 (×12): 1 g via ORAL
  Filled 2024-07-28 (×12): qty 1

## 2024-07-28 MED ORDER — SODIUM CHLORIDE 0.9 % IV SOLN
2.0000 g | INTRAVENOUS | Status: AC
  Administered 2024-07-29 – 2024-07-30 (×2): 2 g via INTRAVENOUS
  Filled 2024-07-28 (×2): qty 20

## 2024-07-28 MED ORDER — SODIUM CHLORIDE 0.9 % IV BOLUS
1000.0000 mL | Freq: Once | INTRAVENOUS | Status: AC
  Administered 2024-07-28: 1000 mL via INTRAVENOUS

## 2024-07-28 NOTE — ED Notes (Signed)
 Samtani MD at bedside to assess and discuss plan of care.

## 2024-07-28 NOTE — ED Triage Notes (Signed)
 Patient here with R sided abdominal pain. Patient points to entire side from top of stomach to lower. Denies n/v/d. Denies shortness of breath, states chest is hurting a little bit. Denies urinary issues. Describes it as stabbing pain. This has been ongoing since being seen at [facility] ED a week ago.

## 2024-07-28 NOTE — Consult Note (Signed)
 Anthony Coffey May 25, 1956  [id number].    Requesting MD: Dr. Mario Blanch Chief Complaint/Reason for Consult: Duodenal Ulcer/Diverticulum   HPI: Anthony Coffey is a 68 y.o. male with a hx of AAA s/p repair, CAD s/p PCI 2018 and NSTEMI 01/2024, COPD, HTN, HLD and duodenal ulcer that was felt to be 2/2 NSAID use s/p IR GDA embolization 02/05/24 who presented to the ED w/ abdominal pain, nausea, and anorexia.   Patient was admitted April 2025 for UGIB w/ Duodenal Ulcer that was felt to be 2/2 NSAID use. Underwent EGD 4/7 with results as below and IR GDA embolization 02/05/24. Reports he did not follow up with GI after d/c. He did miss doses of PPI. Reports he still intermittently uses NSAIDs. Smokes up to 1PPD. 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 No cocaine use or other drug use.   Was seen in the ED 9/23 for abdominal pain and had CT scan that showed abnormal appearance of the descending duodenum with surrounding haziness/stranding; small locule of air noted medial to the lumen could be within a diverticulum or ulcer; mucosal enhancement. Was d/c with PPI, carafate  and GI f/u.   Presented to the ED today w/ abdominal pain and nausea. He reports over the last 3 weeks he has had decreased appetite. Over the last 1.5 weeks he has been having intermittent abdominal pain in the epigastrium and RUQ. Pain is usually brought on by PO intake or movement and is worsened by them as well. No fevers. No hematochezia. Last BM Friday. He underwent CT w/ findings of inflamed duodenum with focal outpouching of fluid and gas consistent with peptic ulcer disease or duodenal diverticulitis. Findings similar to slightly worsened from 07/21/2024. No free intraperitoneal air.   Patient was admitted to [facility]. Started on IV PPI and abx. GI consulted. General surgery asked to see. Patient is not on any blood thinners. Hx of AAA repair. No other abdominal surgeries.   EGD 02/02/24 The esophagus was normal.  Adherent hematin ( altered  blood/ coffee- ground- like material) was found in the gastric antrum.  An acquired benign- appearing, intrinsic moderate stenosis was found in the duodenal bulb and was traversed. On the distal side of that and encompassing the entire distal duodenal bulb and the sweep was the ulcer described below. At the distal end of the ulcer was another stricture that was reached with the scope. Some of the second portion duodenum could be seen but the scope could not be advanced deeply into the second portion due to scope looping and the strictures. Care was taken to use minimal insufflation due to the size and depth of the ulcer with adjacent stranding noted on CTA.  One non- bleeding cratered duodenal ulcer with adherent clot was found in the duodenal bulb. The lesion was approximately 30 mm in largest dimension ( coinciding with CTA description) . There was a large overlying clot with food debris limiting the visualization. No fresh blood was seen coming from that area. For hemostasis, hemostatic spray was deployed Programmer, applications) . Three sprays were applied, which was sufficient to cover the area. After taking that intervention on the duodenal ulcer, the scope was then withdrawn to the stomach. Several biopsies were obtained on the greater curvature of the gastric body, on the lesser curvature of the gastric body, on the greater curvature of the gastric antrum and on the lesser curvature of the gastric antrum with cold forceps for histology. ( All biopsies in one pathology  jar to rule out H. pylori)  FINAL MICROSCOPIC DIAGNOSIS:  A. STOMACH, RANDOM, BIOPSY:  Gastric antral / oxyntic mucosa with focal mild chronic inactive gastritis.  No H. pylori identified on HE stain.  Negative for intestinal metaplasia or dysplasia.   ROS: ROS As above, see hpi   Family History  Problem Relation Age of Onset   Anxiety disorder Brother     Past Medical History:  Diagnosis Date   Aneurysm    Anxiety    CAD (coronary  artery disease)    02/01/17 Cath DES--> OM, staged PCI DES -->mLAD, EF 55%   COPD (chronic obstructive pulmonary disease) (HCC)    smoker   Hypertension    high with anxiety   Myocardial infarction [facility])     Past Surgical History:  Procedure Laterality Date   ABDOMINAL AORTIC ANEURYSM REPAIR     CORONARY STENT INTERVENTION N/A 02/01/2017   Procedure: Coronary Stent Intervention;  Surgeon: Victory LELON Sharps, MD;  Location: [facility] INVASIVE CV LAB;  Service: Cardiovascular;  Laterality: N/A;   CORONARY STENT INTERVENTION N/A 02/03/2017   Procedure: Coronary Stent Intervention;  Surgeon: Ozell Fell, MD;  Location: [facility] INVASIVE CV LAB;  Service: Cardiovascular;  Laterality: N/A;   EAR CYST EXCISION Right 06/14/2013   Procedure: CYST REMOVAL RIGHT MANDIBLE;  Surgeon: Glendia CHRISTELLA Primrose, DDS;  Location: [facility] OR;  Service: Oral Surgery;  Laterality: Right;   ESOPHAGOGASTRODUODENOSCOPY N/A 02/02/2024   Procedure: EGD (ESOPHAGOGASTRODUODENOSCOPY);  Surgeon: Legrand Victory LITTIE DOUGLAS, MD;  Location: [facility] ENDOSCOPY;  Service: Gastroenterology;  Laterality: N/A;   INTRAMEDULLARY (IM) NAIL INTERTROCHANTERIC Left 01/22/2020   Procedure: INTRAMEDULLARY (IM) NAIL INTERTROCHANTRIC;  Surgeon: Sharl Selinda Dover, MD;  Location: [facility] OR;  Service: Orthopedics;  Laterality: Left;   IR ANGIOGRAM SELECTIVE EACH ADDITIONAL VESSEL  02/05/2024   IR ANGIOGRAM VISCERAL SELECTIVE  02/05/2024   IR EMBO ART  VEN HEMORR LYMPH EXTRAV  INC GUIDE ROADMAPPING  02/05/2024   IR US  GUIDE VASC ACCESS RIGHT  02/05/2024   LEFT HEART CATH AND CORONARY ANGIOGRAPHY N/A 02/01/2017   Procedure: Left Heart Cath and Coronary Angiography;  Surgeon: Victory LELON Sharps, MD;  Location: [facility] INVASIVE CV LAB;  Service: Cardiovascular;  Laterality: N/A;   TONSILLECTOMY     TOOTH EXTRACTION N/A 06/14/2013   Procedure: EXTRACTIONS 17, 23, 26, 27, 29, 32;  Surgeon: Glendia CHRISTELLA Primrose, DDS;  Location: [facility] OR;  Service: Oral Surgery;  Laterality: N/A;   VASCULAR SURGERY      Social History:   reports that he has been smoking cigarettes. He has a 39 pack-year smoking history. He has quit using smokeless tobacco. He reports that he does not drink alcohol and does not use drugs.  Allergies: No Known Allergies  (Not in a hospital admission)    Physical Exam: Blood pressure (!) 145/78, pulse (!) 53, temperature 97.8 F (36.6 C), temperature source Oral, resp. rate 12, height 5' 9 (1.753 m), weight 75 kg, SpO2 100%. General: pleasant, WD/WN male who is laying in bed in NAD HEENT: head is normocephalic, atraumatic.  Sclera are non-icteric.  Heart: regular, rate, and rhythm.   Lungs: Respiratory effort nonlabored Abd:  Soft, ND, epigastric and ruq ttp without rigidity or guarding. Umbilical hernia that is reducible. No masses or organomegaly. Prior midline scar well healed.  MS: no BUE edema Skin: warm and dry  Psych: A&Ox4 with an appropriate affect Neuro: normal speech, thought process intact, gait not assessed  Results for orders placed or performed during the hospital  encounter of 07/28/24 (from the past 48 hours)  Lipase, blood     Status: None   Collection Time: 07/28/24 12:24 AM  Result Value Ref Range   Lipase 19 11 - 51 U/L    Comment: Performed at [facility], [address] [place], [place] [address]  Comprehensive metabolic panel     Status: Abnormal   Collection Time: 07/28/24 12:24 AM  Result Value Ref Range   Sodium 134 (L) 135 - 145 mmol/L   Potassium 2.7 (LL) 3.5 - 5.1 mmol/L    Comment: CRITICAL RESULT CALLED TO, READ BACK BY AND VERIFIED WITH LUCAS L. 07/28/24 0120 MAULES   Chloride 99 98 - 111 mmol/L   CO2 24 22 - 32 mmol/L   Glucose, Bld 121 (H) 70 - 99 mg/dL    Comment: Glucose reference range applies only to samples taken after fasting for at least 8 hours.   BUN 8 8 - 23 mg/dL   Creatinine, Ser 9.08 0.61 - 1.24 mg/dL   Calcium  9.4 8.9 - 10.3 mg/dL   Total Protein 7.1 6.5 - 8.1 g/dL   Albumin 3.9 3.5 - 5.0 g/dL   AST 16 15 -  41 U/L   ALT 12 0 - 44 U/L   Alkaline Phosphatase 77 38 - 126 U/L   Total Bilirubin 0.6 0.0 - 1.2 mg/dL   GFR, Estimated >39 >39 mL/min    Comment: (NOTE) Calculated using the CKD-EPI Creatinine Equation (2021)    Anion gap 11 5 - 15    Comment: Performed at [facility], [address], [place], [place] [address]  CBC     Status: Abnormal   Collection Time: 07/28/24 12:24 AM  Result Value Ref Range   WBC 11.7 (H) 4.0 - 10.5 K/uL   RBC 5.12 4.22 - 5.81 MIL/uL   Hemoglobin 15.2 13.0 - 17.0 g/dL   HCT 54.6 60.9 - 47.9 %   MCV 88.5 80.0 - 100.0 fL   MCH 29.7 26.0 - 34.0 pg   MCHC 33.6 30.0 - 36.0 g/dL   RDW 86.2 88.4 - 84.4 %   Platelets 426 (H) 150 - 400 K/uL   nRBC 0.0 0.0 - 0.2 %    Comment: Performed at [facility], [address] [place], [place] [address]  Troponin I (High Sensitivity)     Status: Abnormal   Collection Time: 07/28/24 12:24 AM  Result Value Ref Range   Troponin I (High Sensitivity) 19 (H) <18 ng/L    Comment: (NOTE) Elevated high sensitivity troponin I (hsTnI) values and significant  changes across serial measurements may suggest ACS but many other  chronic and acute conditions are known to elevate hsTnI results.  Refer to the Links section for chest pain algorithms and additional  guidance. Performed at [facility], [address], [place], [place] [address]   Magnesium     Status: None   Collection Time: 07/28/24 12:24 AM  Result Value Ref Range   Magnesium 1.8 1.7 - 2.4 mg/dL    Comment: Performed at [facility], [address], [place], [place] [address]  Troponin I (High Sensitivity)     Status: None   Collection Time: 07/28/24  2:05 AM  Result Value Ref Range   Troponin I (High Sensitivity) 17 <18 ng/L    Comment: (NOTE) Elevated high sensitivity troponin I (hsTnI) values and significant  changes across serial measurements may suggest ACS but many other  chronic and acute conditions are known to elevate hsTnI  results.  Refer to the Links section for chest pain algorithms and additional  guidance. Performed at [facility], [address], [place], [place] [address]   Urinalysis, Routine w reflex microscopic -Urine, Clean Catch     Status: Abnormal   Collection Time: 07/28/24  3:31 AM  Result Value Ref Range   Color, Urine YELLOW YELLOW   APPearance CLEAR CLEAR   Specific Gravity, Urine 1.014 1.005 - 1.030   pH 6.0 5.0 - 8.0   Glucose, UA NEGATIVE NEGATIVE mg/dL   Hgb urine dipstick SMALL (A) NEGATIVE   Bilirubin Urine NEGATIVE NEGATIVE   Ketones, ur NEGATIVE NEGATIVE mg/dL   Protein, ur NEGATIVE NEGATIVE mg/dL   Nitrite NEGATIVE NEGATIVE   Leukocytes,Ua NEGATIVE NEGATIVE   RBC / HPF 0-5 0 - 5 RBC/hpf   WBC, UA 0-5 0 - 5 WBC/hpf   Bacteria, UA NONE SEEN NONE SEEN   Squamous Epithelial / HPF 0-5 0 - 5 /HPF   Mucus PRESENT     Comment: Performed at [facility], [address], [place], [place] [address]  Basic metabolic panel     Status: Abnormal   Collection Time: 07/28/24  8:42 AM  Result Value Ref Range   Sodium 137 135 - 145 mmol/L   Potassium 3.3 (L) 3.5 - 5.1 mmol/L   Chloride 104 98 - 111 mmol/L   CO2 22 22 - 32 mmol/L   Glucose, Bld 95 70 - 99 mg/dL    Comment: Glucose reference range applies only to samples taken after fasting for at least 8 hours.   BUN 8 8 - 23 mg/dL   Creatinine, Ser 9.24 0.61 - 1.24 mg/dL   Calcium  8.7 (L) 8.9 - 10.3 mg/dL   GFR, Estimated >39 >39 mL/min    Comment: (NOTE) Calculated using the CKD-EPI Creatinine Equation (2021)    Anion gap 11 5 - 15    Comment: Performed at [facility], [address], [place], [place] [address]  Lipase, blood     Status: None   Collection Time: 07/28/24  8:42 AM  Result Value Ref Range   Lipase 21 11 - 51 U/L    Comment: Performed at [facility], [address], [place], [place] [address]   CT Angio Abd/Pel W and/or Wo Contrast Result Date: 07/28/2024 EXAM: CTA  ABDOMEN AND PELVIS WITH AND WITHOUT CONTRAST 07/28/2024 03:09:07 AM TECHNIQUE: CTA images of the abdomen and pelvis with and without intravenous contrast. 75 mL iohexol  (OMNIPAQUE ) 350 MG/ML injection. Three-dimensional MIP/volume rendered formations were performed. Automated exposure control, iterative reconstruction, and/or weight based adjustment of the mA/kV was utilized to reduce the radiation dose to as low as reasonably achievable. COMPARISON: CT 07/21/2024 and same day chest radiograph. CLINICAL HISTORY: Abdominal aortic aneurysm (AAA), follow up; Abdominal aortic aneurysm (AAA), post-op. 100cc [id number]; Abdominal aortic aneurysm (AAA), follow up; Abdominal aortic aneurysm (AAA), post-op. Upper abdominal pain. FINDINGS: VASCULATURE: AORTA: Status post infrarenal aortic aneurysm repair. Maximum sac diameter is again measured as 3.3 cm. No evidence of endoleak. CELIAC TRUNK: No acute finding. No occlusion or significant stenosis. SUPERIOR MESENTERIC ARTERY: No acute finding. No occlusion or significant stenosis. 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 RENAL ARTERIES: No acute finding. No occlusion or significant stenosis. 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 These are  unchanged using similar measuring technique. Similar occlusion of the proximal left internal iliac artery with distal reconstitution. LIVER: Hepatic steatosis. GALLBLADDER AND BILE DUCTS: Gallbladder is unremarkable. No biliary ductal dilatation. SPLEEN: The spleen is unremarkable. PANCREAS: The pancreas is unremarkable. ADRENAL GLANDS: Bilateral adrenal glands demonstrate no acute abnormality. KIDNEYS, URETERS AND BLADDER: No stones in the kidneys or ureters. No hydronephrosis. No perinephric or periureteral stranding. Urinary bladder is unremarkable. GI AND BOWEL: Wall thickening and mucosal hyperenhancement about the Gastric Antrum  and duodenum. Hazy stranding about the descending portion of the duodenum. Redemonstrated air and fluid collection medial to the descending duodenum compatible with a small diverticulum or ulceration. Findings are similar to worsened compared to 07/21/2024. No free intraperitoneal air to suggest perforation. There is no bowel obstruction. REPRODUCTIVE: Reproductive organs are unremarkable. PERITONEUM AND RETROPERITONEUM: No abscess or free air. LUNG BASE: No acute abnormality. LYMPH NODES: No lymphadenopathy. BONES AND SOFT TISSUES: Postoperative change left femur. Unchanged compression fracture of T12. No acute soft tissue abnormality. IMPRESSION: 1. Inflamed duodenum with focal outpouching of fluid and gas consistent with peptic ulcer disease or duodenal diverticulitis. Findings similar to slightly worsened from 07/21/2024. No free intraperitoneal air. 2. Status post infrarenal aortic aneurysm repair with stable sac diameter of 3.3 cm. No evidence of endoleak. 3. 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 Electronically signed by: Norman Gatlin MD 07/28/2024 03:33 AM EDT RP Workstation: [id number]   DG Chest 2 View Result Date: 07/28/2024 CLINICAL DATA:  Upper abdominal pain. EXAM: CHEST - 2 VIEW COMPARISON:  February 04, 2024 FINDINGS: The heart size and mediastinal contours are within normal limits. Very mild linear atelectasis is seen within the left lung base. No acute infiltrate, pleural effusion or pneumothorax is identified. The visualized skeletal structures are unremarkable. IMPRESSION: No acute cardiopulmonary disease. Electronically Signed   By: Suzen Dials M.D.   On: 07/28/2024 00:54    Anti-infectives (From admission, onward)    Start     Dose/Rate Route Frequency Ordered Stop   07/29/24 0400  cefTRIAXone (ROCEPHIN) 2 g in sodium chloride  0.9 % 100 mL IVPB        2 g 200 mL/hr over 30 Minutes Intravenous Every 24 hours 07/28/24 0953 07/31/24 0359    07/28/24 1600  metroNIDAZOLE (FLAGYL) IVPB 500 mg        500 mg 100 mL/hr over 60 Minutes Intravenous Every 12 hours 07/28/24 0953 07/30/24 1559   07/28/24 0345  cefTRIAXone (ROCEPHIN) 2 g in sodium chloride  0.9 % 100 mL IVPB        2 g 200 mL/hr over 30 Minutes Intravenous  Once 07/28/24 0339 07/28/24 0709   07/28/24 0345  metroNIDAZOLE (FLAGYL) IVPB 500 mg        500 mg 100 mL/hr over 60 Minutes Intravenous  Once 07/28/24 9660 07/28/24 9294       Assessment/Plan Anthony Coffey is a 68 y.o. male with a hx of duodenal ulcer that was felt to be 2/2 NSAID use s/p IR GDA embolization 02/05/24 who presented to the ED w/ abdominal pain, nausea, and anorexia. Was seen in the ED 9/23 for abdominal pain and had CT scan that showed abnormal appearance of the descending duodenum with surrounding haziness/stranding; small locule of air noted medial to the lumen that was felt could be within a diverticulum or ulcer; mucosal enhancement. Was d/c with PPI, carafate  and GI f/u. CT today w/ findings of inflamed duodenum with focal outpouching of fluid and  gas consistent with peptic ulcer disease or duodenal diverticulitis. Findings similar to slightly worsened from 07/21/2024. No free intraperitoneal air.   Recommendations  - No free air or significant free fluid on CT. He is currently HDS without fever, tachycardia or hypotension. WBC 11.7. No peritonitis on exam. No indication for emergency surgery.  - Agree with GI consult. Consider EGD. Noted GI planning to hold on EGD at this time.  - Agree with Carafate  and BID PPI - Abx per GI - Recommend cessation of tobacco and NSAID  FEN - NPO, IVF per [facility] VTE - SCDs, okay for chem ppx from a general surgery standpoint ID - Rocephin/Flagyl  Foley - None currently Dispo - Admit to [facility].   I reviewed nursing notes, ED provider notes, Consultant (GI) notes, hospitalist notes, last 24 h vitals and pain scores, last 48 h intake and output, last 24 h labs and trends,  and last 24 h imaging results.    Ozell CHRISTELLA Shaper, [facility] Surgery 07/28/2024, 12:44 PM Please see Amion for pager number during day hours 7:00am-4:30pm

## 2024-07-28 NOTE — Consult Note (Addendum)
 Consultation Note   Referring Provider:   [facility] PCP: Pcp, No Primary Gastroenterologist:   Unassigned Dr. Legrand- inpatient       Reason for Consultation: Abdominal pain, duodenal ulcer DOA: 07/28/2024         Hospital Day: 1   ASSESSMENT    68 year old male with history of GI bleed secondary to large duodenal ulcer in the setting of NSAIDs (April 2025).  Now admitted with nausea , ? Weight loss,  upper abdominal pain and CT scan showing inflamed duodenum with focal outpouching of fluid and gas consistent with peptic ulcer disease or duodenal diverticulitis.  CT scan  Findings similar to slightly worsened from 07/21/2024. No free intraperitoneal air.  Suspect pain and CT scan findings are likely secondary to chronic duodenal ulcer.  No evidence for gastric outlet obstruction but there was acquired duodenal stenosis seen on EGD in April.   CAD  HTN BP elevated on admission.   COPD  Hypertension  AAA s/p repair  Anxiety  See PMH for any additional medical history  / medical problems  Principal Problem:   Diverticulitis   PLAN:    --CT scan findings all likely related to persistent duodenal ulcer.  Diverticulitis not excluded for sure, would continue Rocephin and Flagyl for now.  --Continue twice daily IV PPI --Continue Carafate  AC and at bedtime --Given worsening CT scan findings patient is at increased risk for repeat EGD at this time ( risk for perforation).  --Please ask Surgery to evaluate.   --Patient needs to stop taking NSAIDs at home.   HPI   April 2025 Anthony Coffey was seen by us  in the hospital in April 2025 for GI bleed in setting of heavy NSAID use. EGD showed a cratered duodenal ulcer with an adherent clot. He rebled after IV heparin  for heparin  for an NSTEMI and required GDA embolization by IR   07/20/24 ED visit for evaluation of worsening abdominal pain.   CT angio -showed abnormal appearance of the  descending duodenum with surrounding haziness/stranding.  Small locule of air noted medial to the lumen could be within a diverticulum or ulcer.  Mucosal enhancement.  Appearance was concerning for duodenitis with possible ulcer.  Discharged home from the ED with Carafate  and pantoprazole   07/27/24 Patient presented back to the ED with complaints of intermittent RUQ abdominal pain, worse with p.o. intake.  Also having frequent nausea without vomiting.  He thinks symptoms may have led to some weight loss but cannot quantify .  He has not been taking any PPIs /acid reducing medication at home though it appears he was prescribed pantoprazole  at the recent ED visit.  Was also prescribed Carafate  indicates he is not taking it.   He did recently take a couple doses of Goody powders but denies any other NSAID use.  He has not had any blood in stool or black stools.  Repeat CT scan this admission shows worsening duodenal inflammation  Pertinent admission labs : WBC 11.7 Hemoglobin 15.5 Platelets 426 Potassium 2.7 BUN 8 Creatinine 0.75.  High-sensitivity troponin 19 >> 17  CT AP with contrast IMPRESSION: 1. Inflamed duodenum with focal outpouching of fluid and gas consistent with peptic ulcer disease or duodenal diverticulitis. Findings similar to slightly  worsened from 07/21/2024. No free intraperitoneal air. 2. Status post infrarenal aortic aneurysm repair with stable sac diameter of 3.3 cm. No evidence of endoleak. 3. 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   Today potassium has improved to 3.3 Hgb stable at 15.2   Pertinent GI Studies   April 2025 - Normal esophagus.  - Hematin (altered blood/coffee-ground-like material) in the gastric antrum.  - Acquired duodenal stenoses.  - Non-bleeding duodenal ulcer with adherent clot. Hemostatic spray applied.  - Several biopsies were obtained on the greater curvature of the gastric body, on the lesser curvature of  the gastric body, on the greater curvature of the gastric antrum and on the lesser curvature of the gastric antrum.  FINAL MICROSCOPIC DIAGNOSIS:   A. STOMACH, RANDOM, BIOPSY:       Gastric antral / oxyntic mucosa with focal mild chronic inactive  gastritis.      No H. pylori identified on HE stain.       Negative for intestinal metaplasia or dysplasia    Labs and Imaging:  Recent Labs    07/28/24 0024  PROT 7.1  ALBUMIN 3.9  AST 16  ALT 12  ALKPHOS 77  BILITOT 0.6   Recent Labs    07/28/24 0024  WBC 11.7*  HGB 15.2  HCT 45.3  MCV 88.5  PLT 426*   Recent Labs    07/28/24 0024 07/28/24 0842  NA 134* 137  K 2.7* 3.3*  CL 99 104  CO2 24 22  GLUCOSE 121* 95  BUN 8 8  CREATININE 0.91 0.75  CALCIUM  9.4 8.7*     CT Angio Abd/Pel W and/or Wo Contrast EXAM: CTA ABDOMEN AND PELVIS WITH AND WITHOUT CONTRAST 07/28/2024 03:09:07 AM  TECHNIQUE: CTA images of the abdomen and pelvis with and without intravenous contrast. 75 mL iohexol  (OMNIPAQUE ) 350 MG/ML injection. Three-dimensional MIP/volume rendered formations were performed. Automated exposure control, iterative reconstruction, and/or weight based adjustment of the mA/kV was utilized to reduce the radiation dose to as low as reasonably achievable.  COMPARISON: CT 07/21/2024 and same day chest radiograph.  CLINICAL HISTORY: Abdominal aortic aneurysm (AAA), follow up; Abdominal aortic aneurysm (AAA), post-op. 100cc [id number]; Abdominal aortic aneurysm (AAA), follow up; Abdominal aortic aneurysm (AAA), post-op. Upper abdominal pain.  FINDINGS:  VASCULATURE: AORTA: Status post infrarenal aortic aneurysm repair. Maximum sac diameter is again measured as 3.3 cm. No evidence of endoleak.  CELIAC TRUNK: No acute finding. No occlusion or significant stenosis.  SUPERIOR MESENTERIC ARTERY: No acute finding. No occlusion or significant stenosis.  INFERIOR MESENTERIC ARTERY: The IMA is occluded at its origin  with distal reconstitution.  RENAL ARTERIES: No acute finding. No occlusion or significant stenosis.  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 These are unchanged using similar measuring technique. Similar occlusion of the proximal left internal iliac artery with distal reconstitution.  LIVER: Hepatic steatosis.  GALLBLADDER AND BILE DUCTS: Gallbladder is unremarkable. No biliary ductal dilatation.  SPLEEN: The spleen is unremarkable.  PANCREAS: The pancreas is unremarkable.  ADRENAL GLANDS: Bilateral adrenal glands demonstrate no acute abnormality.  KIDNEYS, URETERS AND BLADDER: No stones in the kidneys or ureters. No hydronephrosis. No perinephric or periureteral stranding. Urinary bladder is unremarkable.  GI AND BOWEL: Wall thickening and mucosal hyperenhancement about the Gastric Antrum and duodenum. Hazy stranding about the descending portion of the duodenum. Redemonstrated air and fluid collection medial to the descending duodenum compatible  with a small diverticulum or ulceration. Findings are similar to worsened compared to 07/21/2024. No free intraperitoneal air to suggest perforation. There is no bowel obstruction.  REPRODUCTIVE: Reproductive organs are unremarkable.  PERITONEUM AND RETROPERITONEUM: No abscess or free air.  LUNG BASE: No acute abnormality.  LYMPH NODES: No lymphadenopathy.  BONES AND SOFT TISSUES: Postoperative change left femur. Unchanged compression fracture of T12. No acute soft tissue abnormality.  IMPRESSION: 1. Inflamed duodenum with focal outpouching of fluid and gas consistent with peptic ulcer disease or duodenal diverticulitis. Findings similar to slightly worsened from 07/21/2024. No free intraperitoneal air. 2. Status post infrarenal aortic aneurysm repair with stable sac diameter of 3.3 cm. No evidence of endoleak. 3. Right (2.8 cm) and left (3.4 cm) common  iliac artery aneurysms, unchanged.  Electronically signed by: Norman Gatlin MD 07/28/2024 03:33 AM EDT RP Workstation: [id number] DG Chest 2 View CLINICAL DATA:  Upper abdominal pain.  EXAM: CHEST - 2 VIEW  COMPARISON:  February 04, 2024  FINDINGS: The heart size and mediastinal contours are within normal limits. Very mild linear atelectasis is seen within the left lung base. No acute infiltrate, pleural effusion or pneumothorax is identified. The visualized skeletal structures are unremarkable.  IMPRESSION: No acute cardiopulmonary disease.  Electronically Signed   By: Suzen Dials M.D.   On: 07/28/2024 00:54    Past Medical History:  Diagnosis Date   Aneurysm    Anxiety    CAD (coronary artery disease)    02/01/17 Cath DES--> OM, staged PCI DES -->mLAD, EF 55%   COPD (chronic obstructive pulmonary disease) (HCC)    smoker   Hypertension    high with anxiety   Myocardial infarction [facility])     Past Surgical History:  Procedure Laterality Date   ABDOMINAL AORTIC ANEURYSM REPAIR     CORONARY STENT INTERVENTION N/A 02/01/2017   Procedure: Coronary Stent Intervention;  Surgeon: Victory LELON Sharps, MD;  Location: [facility] INVASIVE CV LAB;  Service: Cardiovascular;  Laterality: N/A;   CORONARY STENT INTERVENTION N/A 02/03/2017   Procedure: Coronary Stent Intervention;  Surgeon: Ozell Fell, MD;  Location: [facility] INVASIVE CV LAB;  Service: Cardiovascular;  Laterality: N/A;   EAR CYST EXCISION Right 06/14/2013   Procedure: CYST REMOVAL RIGHT MANDIBLE;  Surgeon: Glendia CHRISTELLA Primrose, DDS;  Location: [facility] OR;  Service: Oral Surgery;  Laterality: Right;   ESOPHAGOGASTRODUODENOSCOPY N/A 02/02/2024   Procedure: EGD (ESOPHAGOGASTRODUODENOSCOPY);  Surgeon: Legrand Victory LITTIE DOUGLAS, MD;  Location: [facility] ENDOSCOPY;  Service: Gastroenterology;  Laterality: N/A;   INTRAMEDULLARY (IM) NAIL INTERTROCHANTERIC Left 01/22/2020   Procedure: INTRAMEDULLARY (IM) NAIL INTERTROCHANTRIC;  Surgeon: Sharl Selinda Dover, MD;   Location: [facility] OR;  Service: Orthopedics;  Laterality: Left;   IR ANGIOGRAM SELECTIVE EACH ADDITIONAL VESSEL  02/05/2024   IR ANGIOGRAM VISCERAL SELECTIVE  02/05/2024   IR EMBO ART  VEN HEMORR LYMPH EXTRAV  INC GUIDE ROADMAPPING  02/05/2024   IR US  GUIDE VASC ACCESS RIGHT  02/05/2024   LEFT HEART CATH AND CORONARY ANGIOGRAPHY N/A 02/01/2017   Procedure: Left Heart Cath and Coronary Angiography;  Surgeon: Victory LELON Sharps, MD;  Location: [facility] INVASIVE CV LAB;  Service: Cardiovascular;  Laterality: N/A;   TONSILLECTOMY     TOOTH EXTRACTION N/A 06/14/2013   Procedure: EXTRACTIONS 17, 23, 26, 27, 29, 32;  Surgeon: Glendia CHRISTELLA Primrose, DDS;  Location: [facility] OR;  Service: Oral Surgery;  Laterality: N/A;   VASCULAR SURGERY      Family History  Problem Relation Age of Onset   Anxiety  disorder Brother     Prior to Admission medications   Medication Sig Start Date End Date Taking? Authorizing Provider  acetaminophen  (TYLENOL ) 500 MG tablet Take 1 tablet (500 mg total) by mouth every 6 (six) hours as needed for headache (pain). 02/04/17  Yes Henry Manuelita NOVAK, NP  nitroGLYCERIN  (NITROSTAT ) 0.4 MG SL tablet Place 1 tablet (0.4 mg total) under the tongue every 5 (five) minutes as needed for chest pain. 02/08/24  Yes Sebastian Toribio GAILS, MD  sucralfate  (CARAFATE ) 1 g tablet Take 1 tablet (1 g total) by mouth 4 (four) times daily -  with meals and at bedtime. 07/21/24  Yes Horton, Charmaine FALCON, MD  atorvastatin  (LIPITOR ) 80 MG tablet Take 1 tablet (80 mg total) by mouth daily. Patient not taking: Reported on 07/28/2024 02/09/24   Sebastian Toribio GAILS, MD  nicotine  (NICODERM CQ  - DOSED IN MG/24 HOURS) 21 mg/24hr patch Place 1 patch (21 mg total) onto the skin daily. Patient not taking: Reported on 07/28/2024 02/09/24   Sebastian Toribio GAILS, MD    Current Facility-Administered Medications  Medication Dose Route Frequency Provider Last Rate Last Admin   0.9 % NaCl with KCl 40 mEq / L  infusion   Intravenous Continuous Samtani,  Jai-Gurmukh, MD 75 mL/hr at 07/28/24 0942 New Bag at 07/28/24 0942   acetaminophen  (TYLENOL ) tablet 500 mg  500 mg Oral Q6H PRN Samtani, Jai-Gurmukh, MD       [START ON 07/29/2024] cefTRIAXone (ROCEPHIN) 2 g in sodium chloride  0.9 % 100 mL IVPB  2 g Intravenous Q24H Samtani, Jai-Gurmukh, MD       magnesium sulfate IVPB 2 g 50 mL  2 g Intravenous Once Samtani, Jai-Gurmukh, MD 50 mL/hr at 07/28/24 1051 2 g at 07/28/24 1051   metroNIDAZOLE (FLAGYL) IVPB 500 mg  500 mg Intravenous Q12H Samtani, Jai-Gurmukh, MD       morphine  (PF) 2 MG/ML injection 2 mg  2 mg Intravenous Q2H PRN Samtani, Jai-Gurmukh, MD       nicotine  (NICODERM CQ  - dosed in mg/24 hours) patch 21 mg  21 mg Transdermal Daily Samtani, Jai-Gurmukh, MD   21 mg at 07/28/24 1049   ondansetron  (ZOFRAN ) injection 4 mg  4 mg Intravenous Q6H PRN Samtani, Jai-Gurmukh, MD       sucralfate  (CARAFATE ) tablet 1 g  1 g Oral TID WC & HS Samtani, Jai-Gurmukh, MD       Current Outpatient Medications  Medication Sig Dispense Refill   acetaminophen  (TYLENOL ) 500 MG tablet Take 1 tablet (500 mg total) by mouth every 6 (six) hours as needed for headache (pain). 30 tablet 0   nitroGLYCERIN  (NITROSTAT ) 0.4 MG SL tablet Place 1 tablet (0.4 mg total) under the tongue every 5 (five) minutes as needed for chest pain. 20 tablet 0   sucralfate  (CARAFATE ) 1 g tablet Take 1 tablet (1 g total) by mouth 4 (four) times daily -  with meals and at bedtime. 90 tablet 0   atorvastatin  (LIPITOR ) 80 MG tablet Take 1 tablet (80 mg total) by mouth daily. (Patient not taking: Reported on 07/28/2024) 90 tablet 0   nicotine  (NICODERM CQ  - DOSED IN MG/24 HOURS) 21 mg/24hr patch Place 1 patch (21 mg total) onto the skin daily. (Patient not taking: Reported on 07/28/2024) 28 patch 0    Allergies as of 07/27/2024   (No Known Allergies)    Social History   Socioeconomic History   Marital status: Single    Spouse name: Not on file  Number of children: Not on file   Years of  education: Not on file   Highest education level: Not on file  Occupational History   Not on file  Tobacco Use   Smoking status: Every Day    Current packs/day: 1.00    Average packs/day: 1 pack/day for 39.0 years (39.0 ttl pk-yrs)    Types: Cigarettes   Smokeless tobacco: Former  Substance and Sexual Activity   Alcohol use: No   Drug use: No   Sexual activity: Never  Other Topics Concern   Not on file  Social History Narrative   ** Merged History Encounter **       Social Drivers of Health   Financial Resource Strain: Not on file  Food Insecurity: Food Insecurity Present (02/02/2024)   Hunger Vital Sign    Worried About Running Out of Food in the Last Year: Sometimes true    Ran Out of Food in the Last Year: Sometimes true  Transportation Needs: No Transportation Needs (02/02/2024)   PRAPARE - Administrator, Civil Service (Medical): No    Lack of Transportation (Non-Medical): No  Physical Activity: Not on file  Stress: Not on file  Social Connections: Socially Isolated (02/02/2024)   Social Connection and Isolation Panel    Frequency of Communication with Friends and Family: Once a week    Frequency of Social Gatherings with Friends and Family: More than three times a week    Attends Religious Services: Never    Database administrator or Organizations: No    Attends Banker Meetings: Never    Marital Status: Divorced  Catering manager Violence: Not At Risk (02/02/2024)   Humiliation, Afraid, Rape, and Kick questionnaire    Fear of Current or Ex-Partner: No    Emotionally Abused: No    Physically Abused: No    Sexually Abused: No     Code Status   Code Status: Full Code  Review of Systems: All systems reviewed and negative except where noted in HPI.  Physical Exam: Vital signs in last 24 hours: Temp:  [97.6 F (36.4 C)-97.8 F (36.6 C)] 97.8 F (36.6 C) (10/01 0700) Pulse Rate:  [52-73] 52 (10/01 0700) Resp:  [10-18] 17 (10/01 0700) BP:  (122-174)/(71-97) 122/71 (10/01 0700) SpO2:  [96 %-100 %] 98 % (10/01 0700) Weight:  [75 kg] 75 kg (10/01 0005)    General:  Pleasant male in NAD Psych:  Cooperative. Normal mood and affect Eyes: Pupils equal Ears:  Normal auditory acuity Nose: No deformity, discharge or lesions Neck:  Supple, no masses felt Lungs:  Clear to auscultation.  Heart:  Regular rate, regular rhythm.  Abdomen:  Soft, nondistended, nontender, active bowel sounds, no masses felt Rectal :  Deferred Msk: Symmetrical without gross deformities.  Neurologic:  Alert, oriented, grossly normal neurologically Extremities : No edema Skin:  Intact without significant lesions.    Intake/Output from previous day: No intake/output data recorded. Intake/Output this shift:  No intake/output data recorded.   Vina Dasen, NP-C   07/28/2024, 11:14 AM   Attending physician's note   I have taken a history, reviewed the chart, and examined the patient. I performed a substantive portion of this encounter, including complete performance of at least one of the key components, in conjunction with the APP. I agree with the APP's note, impression, and recommendations with my edits.   68 year old male with medical history as outlined above, to include history of hospital admission in 01/2024 where  EGD was notable for a large, 30 mm duodenal ulcer with stricture noted both proximally and distal to the ulcer (distal stricture was not traversable).  Ulcer treated with Nexpowder and biopsies were otherwise benign.  Eventually underwent GDA embolization on 02/05/2024.  Had recommended high-dose PPI, Carafate , and repeat upper endoscopy to evaluate for ulcer healing, but he had not followed up and unclear if he was taking PPI/Carafate  as outpatient.  Presents to the ER with abdominal pain.  Pain has been present since the spring, but worse over the last 3-4 weeks.  Tends to be postprandial, and he has developed sitophobia due to pain.  Does  have nausea but no emesis.  Has used Goody powders recently for the pain.  Admission evaluation notable for the following: - NA 134, K2.7, otherwise normal renal function - Normal liver enzymes - WBC 11.7, otherwise normal CBC - Normal lipase, troponin - CT angiogram: No active bleeding, inflamed duodenum with focal outpouching of fluid and gas consistent with PUD or duodenal diverticulitis, slightly worse compared with CT on 07/21/2024.  No free air.  1) Large duodenal ulcer 2) Abdominal pain CT seems most compatible with persistent large duodenal ulcer.  Does not appear to have perforation on imaging and surgical service consulted with no occasion for surgery.  EGD at this time would largely be diagnostic as he is not bleeding.  This would certainly carry risks in a patient with a known large duodenal ulcer with presumably thin wall.  1 consideration would be if there was a plan to take the patient for surgery due to persistent ulcer and stricturing disease where the stricture was acting akin to a gastric outlet obstruction.  However, not sure we have given an adequate trial of PPI and Carafate  as it is not clear whether or not the patient was ever taking these as an outpatient.  I am more inclined to pursue medical management at this juncture. - High-dose IV PPI - Started on Carafate  AC and at bedtime - Stop all NSAIDs - Full liquids for the time being.  When advancing diet, recommend low-fat, low fiber diet similar to gastroparesis diet - If improving clinically, after 8 weeks of high-dose PPI, would plan on repeat upper endoscopy to evaluate for ulcer healing and whether or not he has persistent duodenal strictures which may or may not be amenable to endoscopic intervention vs need for surgery - Admission CTA was otherwise patent celiac trunk and SMA   I spent 55 minutes of time on this encounter, including in depth chart review, independent review of results as outlined above, communicating  results with the patient directly, face-to-face time with the patient, coordinating care, ordering studies and medications as appropriate, and documentation.     [address] DO, FACG [phone] office

## 2024-07-28 NOTE — H&P (Addendum)
 [facility] HPI   Anthony Coffey FMW:[id number] DOB: [birth year] DOA: 07/28/2024 From: Home code Status full  PCP: Pcp, No   Chief Complaint: Abdominal discomfort  HPI:  68 year old white male community dwelling Previous large ulcerative duodenal ulcer on scope 02/02/2024 on EGD and was admitted for the same receiving 5 units of PRBC at last admission-he also had on 4/10 GDA arteriography and embolization of gastroduodenal artery as well and was counseled against NSAIDs Goody's etc. Previous CAD status post PCI 2018 with elevated cardiac enzymes at same admission not a candidate for intervention at that time Previous AAA 3.5 X3.5 COPD HTN Anxiety disorder Previous left hip fracture status post repair 2021  1 month history of anorexia abdominal discomfort culminating in ED visit [facility]-mid abdominal pain radiating to back--- no overt chest pain-he has purposely been avoiding eating because he has been having pain he tells me He started taking Goody powders again 1 or 2 because of the pain He has been passing stool once a week He does not think he has ever had a colonoscopy and I cannot find any in the chart  Admits to not taking PPI I ran out-looks like when he came there he received a prescription of Carafate  I have been taking that tablet 4 times a day with no relief--- workup done revealed  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  Diverticulum/ulcer-mucosal enhancement throughout descending transverse duodenum The concern was duodenitis  10/1-came to [facility] with pain on the right side passing urine etc.-predominant abdominal pain with radiation up to the epigastrium and around  the lower quadrant     Review of Systems:  As mentioned above in HPI are pertinent +'s Pertinent negatives as per below No fevers no chills no nausea no vomiting predominant symptom is pain, associated anorexia because of the pain No melena No vomiting per se just discomfort No rash   ED Course: In ED given clear liquid which she was unable to really tolerate Found to be severely hypokalemic with potassium of 2 Given several rounds of morphine  Dilaudid  1 dose of Protonix  and 1 packet of Klor-Con     Past Medical History:  Diagnosis Date   Aneurysm    Anxiety    CAD (coronary artery disease)    02/01/17 Cath DES--> OM, staged PCI DES -->mLAD, EF 55%   COPD (chronic obstructive pulmonary disease) (HCC)    smoker   Hypertension    high with anxiety   Myocardial infarction [facility])    Past Surgical History:  Procedure Laterality Date   ABDOMINAL AORTIC ANEURYSM REPAIR     CORONARY STENT INTERVENTION N/A 02/01/2017   Procedure: Coronary Stent Intervention;  Surgeon: Victory LELON Sharps, MD;  Location: [facility] INVASIVE CV LAB;  Service: Cardiovascular;  Laterality: N/A;   CORONARY STENT INTERVENTION N/A 02/03/2017   Procedure: Coronary Stent Intervention;  Surgeon: Ozell Fell, MD;  Location: [facility] INVASIVE CV LAB;  Service: Cardiovascular;  Laterality: N/A;   EAR CYST EXCISION Right 06/14/2013   Procedure: CYST REMOVAL RIGHT MANDIBLE;  Surgeon: Glendia CHRISTELLA Primrose, DDS;  Location: [facility] OR;  Service: Oral Surgery;  Laterality: Right;   ESOPHAGOGASTRODUODENOSCOPY N/A 02/02/2024   Procedure: EGD (ESOPHAGOGASTRODUODENOSCOPY);  Surgeon: Legrand Victory LITTIE DOUGLAS, MD;  Location: [facility] ENDOSCOPY;  Service: Gastroenterology;  Laterality: N/A;   INTRAMEDULLARY (IM) NAIL INTERTROCHANTERIC Left 01/22/2020   Procedure: INTRAMEDULLARY (IM) NAIL INTERTROCHANTRIC;  Surgeon: Sharl Selinda Dover, MD;  Location: [facility] OR;  Service: Orthopedics;  Laterality: Left;   IR ANGIOGRAM SELECTIVE EACH ADDITIONAL VESSEL  02/05/2024   IR ANGIOGRAM  VISCERAL SELECTIVE  02/05/2024   IR EMBO ART  VEN HEMORR LYMPH EXTRAV  INC GUIDE ROADMAPPING  02/05/2024   IR US  GUIDE VASC ACCESS RIGHT  02/05/2024   LEFT HEART CATH AND CORONARY ANGIOGRAPHY N/A 02/01/2017   Procedure: Left Heart Cath and Coronary Angiography;  Surgeon: Victory LELON Sharps, MD;  Location: [facility] INVASIVE CV LAB;  Service: Cardiovascular;  Laterality: N/A;   TONSILLECTOMY     TOOTH EXTRACTION N/A 06/14/2013   Procedure: EXTRACTIONS 17, 23, 26, 27, 29, 32;  Surgeon: Glendia CHRISTELLA Primrose, DDS;  Location: [facility] OR;  Service: Oral Surgery;  Laterality: N/A;   VASCULAR SURGERY      reports that he has been smoking cigarettes. He has a 39 pack-year smoking history. He has quit using smokeless tobacco. He reports that he does not drink alcohol and does not use drugs.  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 Authorizing Provider  acetaminophen  (TYLENOL ) 500 MG tablet Take 1 tablet (500 mg total) by mouth every 6 (six) hours as needed for headache (pain). 02/04/17  Yes Henry Manuelita NOVAK, NP  nitroGLYCERIN  (NITROSTAT ) 0.4 MG SL tablet Place 1 tablet (0.4 mg total) under the tongue every 5 (five) minutes as needed for chest pain. 02/08/24  Yes Sebastian Toribio GAILS, MD  sucralfate  (CARAFATE ) 1 g tablet Take 1 tablet (1 g total) by mouth 4 (four) times daily -  with meals and at bedtime. 07/21/24  Yes Horton, Charmaine FALCON, MD  atorvastatin  (LIPITOR ) 80 MG tablet Take 1 tablet (80 mg total) by mouth daily. Patient not taking: Reported on 07/28/2024 02/09/24   Sebastian Toribio GAILS, MD  nicotine  (NICODERM CQ  - DOSED  IN MG/24 HOURS) 21 mg/24hr patch Place 1 patch (21 mg total) onto the skin daily. Patient not taking: Reported on 07/28/2024 02/09/24   Sebastian Toribio GAILS, MD    Physical Exam:  Vitals:   07/28/24 0515 07/28/24 0700  BP: (!) 150/85 122/71  Pulse: 69 (!) 52  Resp: 14 17  Temp:  97.8 F (36.6 C)  SpO2: 97% 98%    Awake coherent pleasant looks about stated age no icterus no pallor no significant distress on exam S1-S2 no murmur no rub no gallop ROM intact moving all 4 limbs equally vision by direct confrontation is intact Slight tenderness in epigastrium absolutely no chest wall tenderness He is a little distended No lower extremity edema Chest is clear no wheeze no rales no rhonchi Neuro intact moving 4 limbs equally  I have personally reviewed following labs and imaging studies  Labs:  Sodium 134 potassium 2.7 BUN/creatinine 8/0.9 in keeping with prior levels LFTs and lipase all normal At bedtime troponin 19 cycling to 17 WBC 11.7 hemoglobin 15.2 platelet 426 UA negative  Imaging studies:  Imaging repeat 10/1 = inflamed duodenum with focal outpouching of fluid gas consistent with peptic ulcer disease duodenal due to diverticulitis worsened from prior imaging as above no free air Stable infrarenal aortic aneurysm no endoleak, similar right/left common iliac aneurysms unchanged   Medical tests:  EKG independently reviewed:     EKG my overread PR interval 0.12 incomplete right bundle no ST-T wave depressions borderline LVH no ischemia   discussed with performing physician: No  Decision to obtain old records:  Yes  Review and summation of old records:  Yes  Active Problems:   * No active hospital problems. *   Assessment/Plan Acute diverticulitis DDx worsening ulcer disease Worsened by the fact that patient is not compliant with Protonix  at home ran out of meds Give IV PPI 40 twice daily, continue Carafate  slurry 1 g 4 times daily-reasonable to continue  antibiotics although do not think this is infection-this is probably worsening of his ulcer disease 2/2 noncompliance because unable to obtain Protonix --- we will continue IV antibiotics for several more doses per GI  GI feels Gen surg should be on board---at risk for EGD--will consult No goodies in the future no ibuprofen ever 2 large-bore IVs in case bleeding TOC to check in on patient  Severe hypokalemia IVF as above with potassium-continue 1 more day Replace magnesium 1.8 with 2 g now Continue tobacco Counseled to quit-mentioned clearly to him various things that worsen ulcer disease including tobacco,  Hepatic steatosis seen on imaging Outpatient workup  Previous CAD, uncontrolled hypertension on arrival Not taking aspirin /Plavix  or other GDMT? Needs outpatient discussion with GI Do not see that he is been reviewed in the office since 2021  Previous AAA repair Outpatient routine scans as per PCP when he can get 1  Poor access to medical care Patient does not have a PCP-may have taken Protonix  initially after discharge earlier this year but has run out He needs consolidative medical care with routine US  PTF guideline directed treatment TOC to see him    Severity of Illness: The appropriate patient status for this patient is INPATIENT. Inpatient status is judged to be reasonable and necessary in order to provide the required intensity of service to ensure the patient's safety. The patient's presenting symptoms, physical exam findings, and initial radiographic and laboratory data in the context of their chronic comorbidities is felt to place them at high risk for further clinical deterioration. Furthermore, it is not anticipated that the patient will be medically stable for discharge from the hospital within 2 midnights of admission.   * I certify that at the point of admission it is my clinical judgment that the patient will require inpatient hospital care spanning beyond 2  midnights from the point of admission due to high intensity of service, high risk for further deterioration and high frequency of surveillance required.*   Family Communication: No family  DVT ppx: SCD Consults called & Whom: Looks like GI was called-Will add them to the secure chat and particular if needs to be seen emergently  Time spent: 70 minutes  Royal, MD [days-call my NP partners at night for Care related issues] [facility] --[facility] [employer] OR , [email or web address]; password [email or web address]  07/28/2024, 9:38 AM

## 2024-07-28 NOTE — ED Notes (Signed)
 Introduced self to patient at this time. Patient is resting in bed with visible chest rise and fall. The call light is in reach. There are no further requests at this time.

## 2024-07-28 NOTE — ED Notes (Signed)
 Reported pt request for pain medicine to Samtani MD.

## 2024-07-28 NOTE — ED Provider Notes (Addendum)
 Anthony Coffey EMERGENCY DEPARTMENT AT [facility] Provider Note   CSN: [id number] Arrival date & time: 07/27/24  2352     Patient presents with: Abdominal Pain   Anthony Coffey is a 68 y.o. male with history of abdominal aortic aneurysm s/p repair in 2010, tobacco abuse, hypertension, GI bleed, chronic duodenal ulcer with GI bleed, melena, anxiety, COPD, NSTEMI, CAD.  Patient presents to ED for evaluation of abdominal pain, nausea.  States that he was seen 1 week ago at [facility] for abdominal pain, similar symptoms.  He was diagnosed with duodenal ulcer, started on Carafate  and Protonix .  States he has been taking Carafate  since being seen at [facility] but pain persists.  Reports he has been nauseated since 1 week ago and has not had much of an appetite, poor p.o. intake as a result.  Denies any vomiting or diarrhea at home.  Denies blood in stool.  Denies fevers.  Denies dysuria.  Denies any shortness of breath but when asked about chest pain states that he has a little bit of chest pain that is located in his sternum.  He denies this radiates.  He denies that it worsens with ambulation.   Abdominal Pain Associated symptoms: nausea   Associated symptoms: no fever        Prior to Admission medications   Medication Sig Start Date End Date Taking? Authorizing Provider  acetaminophen  (TYLENOL ) 500 MG tablet Take 1 tablet (500 mg total) by mouth every 6 (six) hours as needed for headache (pain). 02/04/17  Yes Henry Manuelita NOVAK, NP  atorvastatin  (LIPITOR ) 80 MG tablet Take 1 tablet (80 mg total) by mouth daily. Patient not taking: Reported on 07/28/2024 02/09/24   Sebastian Toribio GAILS, MD  nicotine  (NICODERM CQ  - DOSED IN MG/24 HOURS) 21 mg/24hr patch Place 1 patch (21 mg total) onto the skin daily. Patient not taking: Reported on 07/28/2024 02/09/24   Sebastian Toribio GAILS, MD  nitroGLYCERIN  (NITROSTAT ) 0.4 MG SL tablet Place 1 tablet (0.4 mg total) under the tongue every 5 (five) minutes  as needed for chest pain. 02/08/24   Sebastian Toribio GAILS, MD  pantoprazole  (PROTONIX ) 40 MG tablet Take 1 tablet (40 mg total) by mouth 2 (two) times daily before a meal. 02/08/24   Sebastian Toribio GAILS, MD  sucralfate  (CARAFATE ) 1 g tablet Take 1 tablet (1 g total) by mouth 4 (four) times daily -  with meals and at bedtime. 07/21/24   Horton, Charmaine FALCON, MD    Allergies: Patient has no known allergies.    Review of Systems  Constitutional:  Negative for fever.  Gastrointestinal:  Positive for abdominal pain and nausea.  All other systems reviewed and are negative.   Updated Vital Signs BP (!) 159/78   Pulse 60   Temp 97.6 F (36.4 C)   Resp 11   Ht 5' 9 (1.753 m)   Wt 75 kg   SpO2 99%   BMI 24.42 kg/m   Physical Exam Vitals and nursing note reviewed.  Constitutional:      General: He is not in acute distress.    Appearance: He is well-developed.  HENT:     Head: Normocephalic and atraumatic.  Eyes:     Conjunctiva/sclera: Conjunctivae normal.  Cardiovascular:     Rate and Rhythm: Normal rate and regular rhythm.     Heart sounds: No murmur heard. Pulmonary:     Effort: Pulmonary effort is normal. No respiratory distress.     Breath sounds:  Normal breath sounds.  Abdominal:     Palpations: Abdomen is soft.     Tenderness: There is abdominal tenderness.     Comments: Tenderness to palpation of right upper, lower abdomen on right.  No overlying skin change, ecchymosis.  Musculoskeletal:        General: No swelling.     Cervical back: Neck supple.  Skin:    General: Skin is warm and dry.     Capillary Refill: Capillary refill takes less than 2 seconds.  Neurological:     Mental Status: He is alert.  Psychiatric:        Mood and Affect: Mood normal.     (all labs ordered are listed, but only abnormal results are displayed) Labs Reviewed  COMPREHENSIVE METABOLIC PANEL WITH GFR - Abnormal; Notable for the following components:      Result Value   Sodium 134 (*)     Potassium 2.7 (*)    Glucose, Bld 121 (*)    All other components within normal limits  CBC - Abnormal; Notable for the following components:   WBC 11.7 (*)    Platelets 426 (*)    All other components within normal limits  URINALYSIS, ROUTINE W REFLEX MICROSCOPIC - Abnormal; Notable for the following components:   Hgb urine dipstick SMALL (*)    All other components within normal limits  TROPONIN I (HIGH SENSITIVITY) - Abnormal; Notable for the following components:   Troponin I (High Sensitivity) 19 (*)    All other components within normal limits  LIPASE, BLOOD  MAGNESIUM  TROPONIN I (HIGH SENSITIVITY)    EKG: None  Radiology: CT Angio Abd/Pel W and/or Wo Contrast Result Date: 07/28/2024 EXAM: CTA ABDOMEN AND PELVIS WITH AND WITHOUT CONTRAST 07/28/2024 03:09:07 AM TECHNIQUE: CTA images of the abdomen and pelvis with and without intravenous contrast. 75 mL iohexol  (OMNIPAQUE ) 350 MG/ML injection. Three-dimensional MIP/volume rendered formations were performed. Automated exposure control, iterative reconstruction, and/or weight based adjustment of the mA/kV was utilized to reduce the radiation dose to as low as reasonably achievable. COMPARISON: CT 07/21/2024 and same day chest radiograph. CLINICAL HISTORY: Abdominal aortic aneurysm (AAA), follow up; Abdominal aortic aneurysm (AAA), post-op. 100cc [id number]; Abdominal aortic aneurysm (AAA), follow up; Abdominal aortic aneurysm (AAA), post-op. Upper abdominal pain. FINDINGS: VASCULATURE: AORTA: Status post infrarenal aortic aneurysm repair. Maximum sac diameter is again measured as 3.3 cm. No evidence of endoleak. CELIAC TRUNK: No acute finding. No occlusion or significant stenosis. SUPERIOR MESENTERIC ARTERY: No acute finding. No occlusion or significant stenosis. 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 RENAL ARTERIES: No acute finding. No occlusion or significant stenosis. ILIAC ARTERIES:  Common iliac artery aneurysm measuring 2.8 cm on the right and 3.4 cm on the left. These are unchanged using similar measuring technique. Similar occlusion of the proximal left internal iliac artery with distal reconstitution. LIVER: Hepatic steatosis. GALLBLADDER AND BILE DUCTS: Gallbladder is unremarkable. No biliary ductal dilatation. SPLEEN: The spleen is unremarkable. PANCREAS: The pancreas is unremarkable. ADRENAL GLANDS: Bilateral adrenal glands demonstrate no acute abnormality. KIDNEYS, URETERS AND BLADDER: No stones in the kidneys or ureters. No hydronephrosis. No perinephric or periureteral stranding. Urinary bladder is unremarkable. GI AND BOWEL: Wall thickening and mucosal hyperenhancement about the Gastric Antrum and duodenum. Hazy stranding about the descending portion of the duodenum. Redemonstrated air and fluid collection medial to the descending duodenum compatible with a small diverticulum or ulceration. Findings are similar to worsened compared to 07/21/2024. No free  intraperitoneal air to suggest perforation. There is no bowel obstruction. REPRODUCTIVE: Reproductive organs are unremarkable. PERITONEUM AND RETROPERITONEUM: No abscess or free air. LUNG BASE: No acute abnormality. LYMPH NODES: No lymphadenopathy. BONES AND SOFT TISSUES: Postoperative change left femur. Unchanged compression fracture of T12. No acute soft tissue abnormality. IMPRESSION: 1. Inflamed duodenum with focal outpouching of fluid and gas consistent with peptic ulcer disease or duodenal diverticulitis. Findings similar to slightly worsened from 07/21/2024. No free intraperitoneal air. 2. Status post infrarenal aortic aneurysm repair with stable sac diameter of 3.3 cm. No evidence of endoleak. 3. 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 Electronically signed by: Norman Gatlin MD 07/28/2024 03:33 AM EDT RP Workstation: [id number]   DG Chest 2 View Result Date: 07/28/2024 CLINICAL DATA:   Upper abdominal pain. EXAM: CHEST - 2 VIEW COMPARISON:  February 04, 2024 FINDINGS: The heart size and mediastinal contours are within normal limits. Very mild linear atelectasis is seen within the left lung base. No acute infiltrate, pleural effusion or pneumothorax is identified. The visualized skeletal structures are unremarkable. IMPRESSION: No acute cardiopulmonary disease. Electronically Signed   By: Suzen Dials M.D.   On: 07/28/2024 00:54    Procedures   Medications Ordered in the ED  potassium chloride  10 mEq in 100 mL IVPB (10 mEq Intravenous New Bag/Given 07/28/24 0346)  potassium chloride  (KLOR-CON ) packet 80 mEq (has no administration in time range)  cefTRIAXone (ROCEPHIN) 2 g in sodium chloride  0.9 % 100 mL IVPB (2 g Intravenous New Bag/Given 07/28/24 0408)  metroNIDAZOLE (FLAGYL) IVPB 500 mg (has no administration in time range)  ondansetron  (ZOFRAN ) injection 4 mg (4 mg Intravenous Given 07/28/24 0147)  sodium chloride  0.9 % bolus 1,000 mL (1,000 mLs Intravenous New Bag/Given 07/28/24 0206)  morphine  (PF) 4 MG/ML injection 4 mg (4 mg Intravenous Given 07/28/24 0148)  pantoprazole  (PROTONIX ) injection 40 mg (40 mg Intravenous Given 07/28/24 0148)  iohexol  (OMNIPAQUE ) 350 MG/ML injection 75 mL (75 mLs Intravenous Contrast Given 07/28/24 0310)  HYDROmorphone  (DILAUDID ) injection 1 mg (1 mg Intravenous Given 07/28/24 0405)    Clinical Course as of 07/28/24 0427  Wed Jul 28, 2024  0339 Admit for upper endoscopy, worsening duodenal ulcer, diverticulitis, hypoK [CG]  [phone] Spoke with Dr. Catrina regarding patient at 29. Has agreed to admit patient. [CG]    Clinical Course User Index [CG] Ruthell Lonni FALCON, PA-C    Medical Decision Making Amount and/or Complexity of Data Reviewed Labs: ordered. Radiology: ordered.  Risk Prescription drug management. Decision regarding hospitalization.   This is a 68 year old male with a known chronic duodenal ulcer presenting to the ED out of  concern of right-sided abdominal pain.  On exam, afebrile and nontachycardic.  Lung sounds clear bilaterally, no hypoxia.  Abdomen with tenderness in right upper and lower quadrants.  Negative rebound or guarding.  Normal neurological exam.  Assessed utilizing CBC, CMP, lipase, magnesium, troponin, urinalysis.  Also added on CT angio of abdomen to assess.  Patient given 4 mg of morphine , 4 mg of Zofran , 40 mg Protonix , 1 L of fluid.  CBC with leukocytosis to 1.7 which is elevated from previous hospital visit 1 week ago.  Metabolic panel with sodium 134, potassium 2.7 with EKG changes.  EKG is nonischemic however has flattened T waves.  No elevated LFT.  Anion gap 11.  Stable creatinine.  Magnesium 1.8.  Troponins are flat.  Lipase 19.  Urinalysis negative for all.  No leak from AAA repair.  Chest  x-ray unremarkable.  CTA abdomen and pelvis shows inflamed duodenum with focal outpouching of fluid and gas consistent with peptic ulcer disease or duodenal diverticulitis.  Findings are slightly worsened from previous exam 9/24.  Started on Rocephin, Flagyl.  Potassium repleted with IV potassium, oral potassium.  Patient discussed with Dr. Alfornia of [facility].  She has agreed to admit patient.  She has requested we send secure chat to GI team requesting endoscopy in the morning.  This was completed.  Patient stable on admission.    Final diagnoses:  Duodenal ulcer  Diverticulitis    ED Discharge Orders     None           Ruthell Lonni FALCON, PA-C 07/28/24 0427    Jerral Meth, MD 07/28/24 [id number]

## 2024-07-29 DIAGNOSIS — K602 Anal fissure, unspecified: Secondary | ICD-10-CM

## 2024-07-29 DIAGNOSIS — K269 Duodenal ulcer, unspecified as acute or chronic, without hemorrhage or perforation: Secondary | ICD-10-CM | POA: Diagnosis not present

## 2024-07-29 DIAGNOSIS — K5792 Diverticulitis of intestine, part unspecified, without perforation or abscess without bleeding: Secondary | ICD-10-CM | POA: Diagnosis not present

## 2024-07-29 DIAGNOSIS — K6289 Other specified diseases of anus and rectum: Secondary | ICD-10-CM | POA: Diagnosis not present

## 2024-07-29 DIAGNOSIS — R1013 Epigastric pain: Secondary | ICD-10-CM | POA: Diagnosis not present

## 2024-07-29 LAB — COMPREHENSIVE METABOLIC PANEL WITH GFR
ALT: 9 U/L (ref 0–44)
AST: 12 U/L — ABNORMAL LOW (ref 15–41)
Albumin: 3 g/dL — ABNORMAL LOW (ref 3.5–5.0)
Alkaline Phosphatase: 58 U/L (ref 38–126)
Anion gap: 10 (ref 5–15)
BUN: 7 mg/dL — ABNORMAL LOW (ref 8–23)
CO2: 22 mmol/L (ref 22–32)
Calcium: 8.5 mg/dL — ABNORMAL LOW (ref 8.9–10.3)
Chloride: 105 mmol/L (ref 98–111)
Creatinine, Ser: 0.72 mg/dL (ref 0.61–1.24)
GFR, Estimated: >60 mL/min (ref >60)
Glucose, Bld: 86 mg/dL (ref 70–99)
Potassium: 3.1 mmol/L — ABNORMAL LOW (ref 3.5–5.1)
Sodium: 137 mmol/L (ref 135–145)
Total Bilirubin: 0.7 mg/dL (ref 0.0–1.2)
Total Protein: 5.6 g/dL — ABNORMAL LOW (ref 6.5–8.1)

## 2024-07-29 LAB — CBC
HCT: 36.8 % — ABNORMAL LOW (ref 39.0–52.0)
Hemoglobin: 12.7 g/dL — ABNORMAL LOW (ref 13.0–17.0)
MCH: 30.1 pg (ref 26.0–34.0)
MCHC: 34.5 g/dL (ref 30.0–36.0)
MCV: 87.2 fL (ref 80.0–100.0)
Platelets: 352 K/uL (ref 150–400)
RBC: 4.22 MIL/uL (ref 4.22–5.81)
RDW: 13.8 % (ref 11.5–15.5)
WBC: 7.8 K/uL (ref 4.0–10.5)
nRBC: 0 % (ref 0.0–0.2)

## 2024-07-29 LAB — MAGNESIUM: Magnesium: 2 mg/dL (ref 1.7–2.4)

## 2024-07-29 MED ORDER — MORPHINE SULFATE (PF) 2 MG/ML IV SOLN
1.0000 mg | INTRAVENOUS | Status: DC | PRN
  Administered 2024-07-29 – 2024-07-31 (×11): 1 mg via INTRAVENOUS
  Filled 2024-07-29 (×11): qty 1

## 2024-07-29 MED ORDER — NUTRISOURCE FIBER PO PACK
1.0000 | PACK | Freq: Every day | ORAL | Status: DC
  Administered 2024-07-30 – 2024-07-31 (×2): 1 via ORAL
  Filled 2024-07-29 (×2): qty 1

## 2024-07-29 MED ORDER — NUTRISOURCE FIBER PO PACK
1.0000 | PACK | Freq: Every day | ORAL | Status: DC
  Administered 2024-07-29: 1
  Filled 2024-07-29: qty 1

## 2024-07-29 MED ORDER — BOOST / RESOURCE BREEZE PO LIQD CUSTOM
1.0000 | Freq: Three times a day (TID) | ORAL | Status: DC
Start: 2024-07-29 — End: 2024-07-31
  Administered 2024-07-29 – 2024-07-31 (×3): 1 via ORAL

## 2024-07-29 MED ORDER — POTASSIUM CHLORIDE CRYS ER 20 MEQ PO TBCR
40.0000 meq | EXTENDED_RELEASE_TABLET | Freq: Once | ORAL | Status: AC
  Administered 2024-07-29: 40 meq via ORAL
  Filled 2024-07-29: qty 2

## 2024-07-29 MED ORDER — DILTIAZEM GEL 2 %
Freq: Three times a day (TID) | CUTANEOUS | Status: DC
  Filled 2024-07-29: qty 30

## 2024-07-29 NOTE — Progress Notes (Signed)
Subjective/Chief Complaint: PT doing well  Some soreness   Objective: Vital signs in last 24 hours: Temp:  [97.8 F (36.6 C)-99.6 F (37.6 C)] 97.8 F (36.6 C) (10/02 0721) Pulse Rate:  [51-66] 56 (10/02 0721) Resp:  [12-22] 19 (10/02 0721) BP: (111-166)/(62-80) 166/80 (10/02 0721) SpO2:  [94 %-100 %] 98 % (10/02 0721) Last BM Date : 07/28/24  Intake/Output from previous day: 10/01 0701 - 10/02 0700 In: 742.2 [I.V.:691.4; IV Piggyback:50.8] Out: -  Intake/Output this shift: No intake/output data recorded.  PE:  Constitutional: No acute distress, conversant, appears states age. Eyes: Anicteric sclerae, moist conjunctiva, no lid lag Lungs: Clear to auscultation bilaterally, normal respiratory effort CV: regular rate and rhythm, no murmurs, no peripheral edema, pedal pulses 2+ GI: Soft, no masses or hepatosplenomegaly, min-tender to palpation to epigastrum Skin: No rashes, palpation reveals normal turgor Psychiatric: appropriate judgment and insight, oriented to person, place, and time   Lab Results:  Recent Labs    07/28/24 0024 07/29/24 0332  WBC 11.7* 7.8  HGB 15.2 12.7*  HCT 45.3 36.8*  PLT 426* 352   BMET Recent Labs    07/28/24 0842 07/29/24 0332  NA 137 137  K 3.3* 3.1*  CL 104 105  CO2 22 22  GLUCOSE 95 86  BUN 8 7*  CREATININE 0.75 0.72  CALCIUM  8.7* 8.5*   PT/INR No results for input(s): LABPROT, INR in the last 72 hours. ABG No results for input(s): PHART, HCO3 in the last 72 hours.  Invalid input(s): PCO2, PO2  Studies/Results: CT Angio Abd/Pel W and/or Wo Contrast Result Date: 07/28/2024 EXAM: CTA ABDOMEN AND PELVIS WITH AND WITHOUT CONTRAST 07/28/2024 03:09:07 AM TECHNIQUE: CTA images of the abdomen and pelvis with and without intravenous contrast. 75 mL iohexol  (OMNIPAQUE ) 350 MG/ML injection. Three-dimensional MIP/volume rendered formations were performed. Automated exposure control, iterative reconstruction, and/or  weight based adjustment of the mA/kV was utilized to reduce the radiation dose to as low as reasonably achievable. COMPARISON: CT 07/21/2024 and same day chest radiograph. CLINICAL HISTORY: Abdominal aortic aneurysm (AAA), follow up; Abdominal aortic aneurysm (AAA), post-op. 100cc omni350; Abdominal aortic aneurysm (AAA), follow up; Abdominal aortic aneurysm (AAA), post-op. Upper abdominal pain. FINDINGS: VASCULATURE: AORTA: Status post infrarenal aortic aneurysm repair. Maximum sac diameter is again measured as 3.3 cm. No evidence of endoleak. CELIAC TRUNK: No acute finding. No occlusion or significant stenosis. SUPERIOR MESENTERIC ARTERY: No acute finding. No occlusion or significant stenosis. 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 RENAL ARTERIES: No acute finding. No occlusion or significant stenosis. 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 These are unchanged using similar measuring technique. Similar occlusion of the proximal left internal iliac artery with distal reconstitution. LIVER: Hepatic steatosis. GALLBLADDER AND BILE DUCTS: Gallbladder is unremarkable. No biliary ductal dilatation. SPLEEN: The spleen is unremarkable. PANCREAS: The pancreas is unremarkable. ADRENAL GLANDS: Bilateral adrenal glands demonstrate no acute abnormality. KIDNEYS, URETERS AND BLADDER: No stones in the kidneys or ureters. No hydronephrosis. No perinephric or periureteral stranding. Urinary bladder is unremarkable. GI AND BOWEL: Wall thickening and mucosal hyperenhancement about the Gastric Antrum and duodenum. Hazy stranding about the descending portion of the duodenum. Redemonstrated air and fluid collection medial to the descending duodenum compatible with a small diverticulum or ulceration. Findings are similar to worsened compared to 07/21/2024. No free intraperitoneal air to suggest perforation. There is no bowel  obstruction. REPRODUCTIVE: Reproductive organs are  unremarkable. PERITONEUM AND RETROPERITONEUM: No abscess or free air. LUNG BASE: No acute abnormality. LYMPH NODES: No lymphadenopathy. BONES AND SOFT TISSUES: Postoperative change left femur. Unchanged compression fracture of T12. No acute soft tissue abnormality. IMPRESSION: 1. Inflamed duodenum with focal outpouching of fluid and gas consistent with peptic ulcer disease or duodenal diverticulitis. Findings similar to slightly worsened from 07/21/2024. No free intraperitoneal air. 2. Status post infrarenal aortic aneurysm repair with stable sac diameter of 3.3 cm. No evidence of endoleak. 3. 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 Electronically signed by: Norman Gatlin MD 07/28/2024 03:33 AM EDT RP Workstation: [id number]   DG Chest 2 View Result Date: 07/28/2024 CLINICAL DATA:  Upper abdominal pain. EXAM: CHEST - 2 VIEW COMPARISON:  February 04, 2024 FINDINGS: The heart size and mediastinal contours are within normal limits. Very mild linear atelectasis is seen within the left lung base. No acute infiltrate, pleural effusion or pneumothorax is identified. The visualized skeletal structures are unremarkable. IMPRESSION: No acute cardiopulmonary disease. Electronically Signed   By: Suzen Dials M.D.   On: 07/28/2024 00:54    Anti-infectives: Anti-infectives (From admission, onward)    Start     Dose/Rate Route Frequency Ordered Stop   07/29/24 0400  cefTRIAXone (ROCEPHIN) 2 g in sodium chloride  0.9 % 100 mL IVPB        2 g 200 mL/hr over 30 Minutes Intravenous Every 24 hours 07/28/24 0953 07/31/24 0359   07/28/24 1600  metroNIDAZOLE (FLAGYL) IVPB 500 mg        500 mg 100 mL/hr over 60 Minutes Intravenous Every 12 hours 07/28/24 0953 07/30/24 1559   07/28/24 0345  cefTRIAXone (ROCEPHIN) 2 g in sodium chloride  0.9 % 100 mL IVPB        2 g 200 mL/hr over 30 Minutes Intravenous  Once 07/28/24 0339 07/28/24 0709    07/28/24 0345  metroNIDAZOLE (FLAGYL) IVPB 500 mg        500 mg 100 mL/hr over 60 Minutes Intravenous  Once 07/28/24 9660 07/28/24 9294       Assessment/Plan: Anthony Coffey is a 68 y.o. male with a hx of duodenal ulcer that was felt to be 2/2 NSAID use s/p IR GDA embolization 02/05/24 who presented to the ED w/ abdominal pain, nausea, and anorexia. Was seen in the ED 9/23 for abdominal pain and had CT scan that showed abnormal appearance of the descending duodenum with surrounding haziness/stranding; small locule of air noted medial to the lumen that was felt could be within a diverticulum or ulcer; mucosal enhancement. Was d/c with PPI, carafate  and GI f/u. CT today w/ findings of inflamed duodenum with focal outpouching of fluid and gas consistent with peptic ulcer disease or duodenal diverticulitis. Findings similar to slightly worsened from 07/21/2024. No free intraperitoneal air.    Recommendations  - No free air or significant free fluid on CT. He is currently HDS without fever, tachycardia or hypotension. No peritonitis on exam. No indication for surgery.  -Agree with GI consult. Consider EGD. Noted GI planning to hold on EGD at this time.  - Agree with Carafate  and BID PPI - Abx per GI - Recommend cessation of tobacco and NSAID -please call with questions   FEN - adv diet as tol, IVF per [facility] VTE - SCDs, okay for chem ppx from a general surgery standpoint ID - Rocephin/Flagyl  Foley - None currently Dispo - per [facility]  LOS: 1 day    Satin Boal  07/29/2024  

## 2024-07-29 NOTE — Plan of Care (Signed)

## 2024-07-29 NOTE — TOC Initial Note (Signed)
 Transition of Care [facility]) - Initial/Assessment Note    Patient Details  Name: Anthony Coffey MRN: [id number] Date of Birth: [birth year]  [facility]) CM/SW Contact:    Andrez JULIANNA George, RN Phone Number: 07/29/2024, 2:47 PM  Clinical Narrative:                  Pt admitted with abdominal discomfort.  He is from home alone but states his brother can check on him. He drives himself and manages his own medications.  No DME at home.  No PCP. Pt agrees to CM assisting in finding PcP. Appt on AVS.  Pt has transport home when medically ready.  IP Care management following.  Expected Discharge Plan: Home/Self Care Barriers to Discharge: Continued Medical Work up   Patient Goals and CMS Choice            Expected Discharge Plan and Services   Discharge Planning Services: CM Consult   Living arrangements for the past 2 months: Single Family Home                                      Prior Living Arrangements/Services Living arrangements for the past 2 months: Single Family Home Lives with:: Self Patient language and need for interpreter reviewed:: Yes Do you feel safe going back to the place where you live?: Yes            Criminal Activity/Legal Involvement Pertinent to Current Situation/Hospitalization: No - Comment as needed  Activities of Daily Living   ADL Screening (condition at time of admission) Independently performs ADLs?: Yes (appropriate for developmental age) Is the patient deaf or have difficulty hearing?: No Does the patient have difficulty seeing, even when wearing glasses/contacts?: No Does the patient have difficulty concentrating, remembering, or making decisions?: No  Permission Sought/Granted                  Emotional Assessment Appearance:: Appears stated age Attitude/Demeanor/Rapport: Engaged Affect (typically observed): Accepting Orientation: : Oriented to Self, Oriented to Place, Oriented to  Time, Oriented to  Situation   Psych Involvement: No (comment)  Admission diagnosis:  Diverticulitis [K57.92] Duodenal ulcer [K26.9] Patient Active Problem List   Diagnosis Date Noted   Diverticulitis 07/28/2024   Duodenal ulcer 07/28/2024   Abdominal pain, chronic, epigastric 07/28/2024   Protein-calorie malnutrition, severe 02/06/2024   NSTEMI (non-ST elevated myocardial infarction) (HCC) 02/05/2024   Anxiety 02/04/2024   Chronic obstructive pulmonary disease (HCC) 02/04/2024   Coronary artery disease involving native heart without angina pectoris 02/04/2024   Upper GI bleed 02/03/2024   GI bleed 02/02/2024   Acute duodenal ulcer with bleeding 02/02/2024   ABLA (acute blood loss anemia) 02/02/2024   Transient hypotension 02/02/2024   History of AAA (abdominal aortic aneurysm) repair 02/02/2024   Chronic duodenal ulcer with bleeding 02/02/2024   Melena 02/02/2024   Leukocytosis 02/02/2024   Closed left hip fracture, initial encounter (HCC) 01/22/2020   Acute urinary retention 01/22/2020   Hypertriglyceridemia 01/21/2018   Medication management 04/16/2017   Essential hypertension 03/25/2017   Hyperlipidemia LDL goal <70 02/04/2017   Tobacco abuse 02/04/2017   Old MI (myocardial infarction), inferior ST elevation infarction 02/01/2017   Coronary artery disease involving native coronary artery of native heart with angina pectoris    PCP:  Pcp, No Pharmacy:   [id number] - [place], [place] - [address] [place] [address]  [address] [place] [address] [place] [address] Phone: [phone] Fax: [phone]     Social Drivers of Health (SDOH) Social History: SDOH Screenings   Food Insecurity: Patient Declined (07/28/2024)  Housing: Low Risk  (07/28/2024)  Transportation Needs: No Transportation Needs (07/28/2024)  Utilities: Not At Risk (07/28/2024)  Social Connections: Socially Isolated (07/28/2024)  Tobacco Use: High Risk (07/28/2024)   SDOH Interventions:     Readmission Risk  Interventions     No data to display

## 2024-07-29 NOTE — Progress Notes (Addendum)
 Daily Progress Note  DOA: 07/28/2024 Hospital Day: 2 Unassigned - Dr. Legrand ( inpatient only)   Cc: Abdominal pain, duodenal ulcer  ASSESSMENT    68 year old male with history of GI bleed secondary to large duodenal ulcer (biopsy negative for malignancy) with acquired duodenal stenosis in the setting of NSAIDs (April 2025). SABRA Required GDA by IR.  Now admitted with nausea , ? Weight loss,  upper abdominal pain and CT angio showing inflamed duodenum with focal outpouching of fluid and gas consistent with peptic ulcer disease or duodenal diverticulitis.   TODAY:   Abdominal pain not quite as bad as yesterday. Suspect pain / CT scan findings related to large duodenal ulcer rather than diverticulitis. Hgb normal on admission, down to 12.7 today (with IV fluids).     Rectal pain ( new)  Had BM around 4 AM.  Says stool was not hard, no excessive straining.  He did see a tiny amount of red blood when he wiped .  Began having severe rectal pain a few hours later    Hypokalemia K+ 3.1   CAD   HTN BP elevated on admission.    COPD   Hypertension   AAA s/p repair   Anxiety   Principal Problem:   Diverticulitis Active Problems:   Duodenal ulcer   Abdominal pain, chronic, epigastric    PLAN   -EGD would certainly carry risks given large duodenal ulcer with presumably thin wall. Patient tells me he took 3 months of pantoprazole  upon hospital discharge in April. -Appreciate Surgery's evaluation -Continue Carafate  AC and at bedtime -Continue twice daily IV PPI -Continue empiric Rocephin and Flagyl for now --Ordered a.m. CBC -Must stop NSAIDs altogether -Treat empirically for anal fissure with Diltiazem ointment ( if on formulary) -Will add daily fiber given suspicion of anal fissure and also still Carafate  can be constipating - Potassium repletion per primary team   Subjective    Abdominal pain not quite as bad as yesterday but having terrible rectal pain ( new) over  the last few hours    Objective    Recent Labs    07/28/24 0024 07/29/24 0332  WBC 11.7* 7.8  HGB 15.2 12.7*  HCT 45.3 36.8*  MCV 88.5 87.2  PLT 426* 352   No results for input(s): FOLATE, VITAMINB12, FERRITIN, TIBC, IRONPCTSAT in the last 72 hours. Recent Labs    07/28/24 0024 07/28/24 0842 07/29/24 0332  NA 134* 137 137  K 2.7* 3.3* 3.1*  CL 99 104 105  CO2 24 22 22   GLUCOSE 121* 95 86  BUN 8 8 7*  CREATININE 0.91 0.75 0.72  CALCIUM  9.4 8.7* 8.5*   Recent Labs    07/28/24 0024 07/29/24 0332  PROT 7.1 5.6*  ALBUMIN 3.9 3.0*  AST 16 12*  ALT 12 9  ALKPHOS 77 58  BILITOT 0.6 0.7      Imaging:  CT Angio Abd/Pel W and/or Wo Contrast EXAM: CTA ABDOMEN AND PELVIS WITH AND WITHOUT CONTRAST 07/28/2024 03:09:07 AM  TECHNIQUE: CTA images of the abdomen and pelvis with and without intravenous contrast. 75 mL iohexol  (OMNIPAQUE ) 350 MG/ML injection. Three-dimensional MIP/volume rendered formations were performed. Automated exposure control, iterative reconstruction, and/or weight based adjustment of the mA/kV was utilized to reduce the radiation dose to as low as reasonably achievable.  COMPARISON: CT 07/21/2024 and same day chest radiograph.  CLINICAL HISTORY: Abdominal aortic aneurysm (AAA), follow up; Abdominal aortic aneurysm (AAA), post-op. 100cc omni350; Abdominal aortic aneurysm (AAA), follow  up; Abdominal aortic aneurysm (AAA), post-op. Upper abdominal pain.  FINDINGS:  VASCULATURE: AORTA: Status post infrarenal aortic aneurysm repair. Maximum sac diameter is again measured as 3.3 cm. No evidence of endoleak.  CELIAC TRUNK: No acute finding. No occlusion or significant stenosis.  SUPERIOR MESENTERIC ARTERY: No acute finding. No occlusion or significant stenosis.  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  RENAL ARTERIES: No acute finding. No occlusion or significant  stenosis.  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 These are unchanged using similar measuring technique. Similar occlusion of the proximal left internal iliac artery with distal reconstitution.  LIVER: Hepatic steatosis.  GALLBLADDER AND BILE DUCTS: Gallbladder is unremarkable. No biliary ductal dilatation.  SPLEEN: The spleen is unremarkable.  PANCREAS: The pancreas is unremarkable.  ADRENAL GLANDS: Bilateral adrenal glands demonstrate no acute abnormality.  KIDNEYS, URETERS AND BLADDER: No stones in the kidneys or ureters. No hydronephrosis. No perinephric or periureteral stranding. Urinary bladder is unremarkable.  GI AND BOWEL: Wall thickening and mucosal hyperenhancement about the Gastric Antrum and duodenum. Hazy stranding about the descending portion of the duodenum. Redemonstrated air and fluid collection medial to the descending duodenum compatible with a small diverticulum or ulceration. Findings are similar to worsened compared to 07/21/2024. No free intraperitoneal air to suggest perforation. There is no bowel obstruction.  REPRODUCTIVE: Reproductive organs are unremarkable.  PERITONEUM AND RETROPERITONEUM: No abscess or free air.  LUNG BASE: No acute abnormality.  LYMPH NODES: No lymphadenopathy.  BONES AND SOFT TISSUES: Postoperative change left femur. Unchanged compression fracture of T12. No acute soft tissue abnormality.  IMPRESSION: 1. Inflamed duodenum with focal outpouching of fluid and gas consistent with peptic ulcer disease or duodenal diverticulitis. Findings similar to slightly worsened from 07/21/2024. No free intraperitoneal air. 2. Status post infrarenal aortic aneurysm repair with stable sac diameter of 3.3 cm. No evidence of endoleak. 3. 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  Electronically signed by: Norman Gatlin MD 07/28/2024  03:33 AM EDT RP Workstation: [id number] DG Chest 2 View CLINICAL DATA:  Upper abdominal pain.  EXAM: CHEST - 2 VIEW  COMPARISON:  February 04, 2024  FINDINGS: The heart size and mediastinal contours are within normal limits. Very mild linear atelectasis is seen within the left lung base. No acute infiltrate, pleural effusion or pneumothorax is identified. The visualized skeletal structures are unremarkable.  IMPRESSION: No acute cardiopulmonary disease.  Electronically Signed   By: Suzen Dials M.D.   On: 07/28/2024 00:54     Scheduled inpatient medications:   feeding supplement  1 Container Oral TID BM   nicotine   21 mg Transdermal Daily   pantoprazole  (PROTONIX ) IV  40 mg Intravenous Q12H   potassium chloride   40 mEq Oral Once   sucralfate   1 g Oral TID WC & HS   Continuous inpatient infusions:   cefTRIAXone (ROCEPHIN)  IV 2 g (07/29/24 0458)   metronidazole 500 mg (07/29/24 0352)   PRN inpatient medications: acetaminophen , morphine  injection, ondansetron  (ZOFRAN ) IV  Vital signs in last 24 hours: Temp:  [97.8 F (36.6 C)-99.6 F (37.6 C)] 97.8 F (36.6 C) (10/02 0721) Pulse Rate:  [51-66] 56 (10/02 0721) Resp:  [12-22] 19 (10/02 0721) BP: (111-166)/(62-80) 166/80 (10/02 0721) SpO2:  [94 %-100 %] 98 % (10/02 0721) Last BM Date : 07/28/24  Intake/Output Summary (Last 24 hours) at 07/29/2024 1047 Last data filed at  07/29/2024 0500 Gross per 24 hour  Intake 742.18 ml  Output --  Net 742.18 ml    Intake/Output from previous day: 10/01 0701 - 10/02 0700 In: 742.2 [I.V.:691.4; IV Piggyback:50.8] Out: -  Intake/Output this shift: No intake/output data recorded.   Physical Exam:  General: Alert male in NAD Heart:  Regular rate and rhythm.  Pulmonary: Normal respiratory effort Abdomen: Soft, nondistended, nontender. Normal bowel sounds. Rectal: Small external hemorrhoids.  Limited evaluation for fissure due to patient's severe discomfort.  Extremities:  No lower extremity edema  Neurologic: Alert and oriented Psych: Pleasant. Cooperative     LOS: 1 day   Vina Dasen ,NP 07/29/2024, 10:47 AM    Attending physician's note   I have taken a history, reviewed the chart, and examined the patient. I performed a substantive portion of this encounter, including complete performance of at least one of the key components, in conjunction with the APP. I agree with the APP's note, impression, and recommendations with my edits.   Acute onset rectal pain this morning a couple hours after BM.  Did have some straining to have BM and has not had a BM in the last 4 days or so and saw scant BRB on tissue paper.  No prior similar rectal pain in the past.  Otherwise abdominal pain seems to be overall improved.  Tolerating p.o. intake.  Labs reviewed and largely stable with normal WBC, normal renal function.  1) Large duodenal ulcer 2) Epigastric pain Has been evaluated by the surgical service and no acute surgical needs.  Hopefully ulcer starts to improve with regular use of PPI and Carafate .  Did again discussed with the patient the possibility of leftover stricturing disease or nonhealing ulcer which could eventually require surgical intervention.  However, the hope would be that this resolves with medical management alone.  For the time being, would continue with a diet akin to GOO diet. - Continue Protonix  40 mg IV twice daily for now, then can transition to 40 mg p.o. twice daily for at least 8 weeks, then reduce to 40 mg daily - Continue Carafate  AC and at bedtime x 4 weeks, then reduce to twice daily x 4 weeks - Tentative plan for repeat upper endoscopy after 8 weeks to evaluate for appropriate ulcer healing and whether or not he has persistent duodenal strictures which may or may not be amenable to endoscopic intervention vs need for surgery - Admission CTA was otherwise patent celiac trunk and SMA - Avoid all NSAIDs - Counseled on smoking  cessation - Low-fat, low fiber diet and advance as tolerated - ABX x 7 days  3) Rectal pain - Trial of topical nifedipine for suspected anal fissure - Fiber supplement and can add stool softeners and/or laxatives as needed for goal of soft stools without straining to [facility]  Inpatient GI service will follow peripherally at this juncture.  Will start making arrangements for follow-up in the GI clinic with outpatient upper endoscopy in about 8 weeks.  Please do not hesitate to contact us  with additional questions or concerns or if there is a change in clinical status.  A total of 50 minutes of time was spent on this encounter, including in depth chart review, independent review of results as outlined above, communicating results with the patient directly, face-to-face time with the patient, coordinating care, and ordering studies and medications as appropriate, and documentation.   [address] DO, FACG [phone] office

## 2024-07-29 NOTE — Progress Notes (Signed)
 PROGRESS NOTE    Anthony Coffey  FMW:[id number] DOB: 11-16-1955 DOA: 07/28/2024 PCP: Pcp, No  Brief Narrative: 68 year old male admitted with abdominal pain, weight loss and decreased appetite.  He has been taking Goody powder for abdominal pain.  He does complain of constipation.  CT abdomen and pelvis in the ED showed inflamed duodenum with focal outpouching of fluid and gas consistent with peptic ulcer disease or duodenal diverticulitis.  These findings are worse since July 21, 2024.  GI and surgery has been consulted.   Assessment & Plan:   Principal Problem:   Diverticulitis Active Problems:   Duodenal ulcer   Abdominal pain, chronic, epigastric   #1 duodenal ulcer with duodenitis and acute diverticulitis seen by GI and surgery.  Concern for perforation with EGD at high risk. EGD not planned during this admission.  GI recommends to continue PPI sucralfate . Diet advancement per GI  #2 concern for anal fissure with constipation will start stool softeners and try diltiazem ointment  #3 acute diverticulitis pain improved on Rocephin and Flagyl.  #4 hypokalemia replete aggressively recheck magnesium level today received replacement as today for both.  #5 tobacco abuse continue nicotine  patch    Estimated body mass index is 24.42 kg/m as calculated from the following:   Height as of this encounter: 5' 9 (1.753 m).   Weight as of this encounter: 75 kg.  DVT prophylaxis: None due to concern for GI bleed  code Status: Full code  family Communication: None at bedside  disposition Plan:  Status is: Inpatient Remains inpatient appropriate because: Acute illness   Consultants:  GI and general surgery  Procedures: None Antimicrobials: Rocephin and Flagyl Subjective: Reports he feels better than yesterday pain is improved had a small bowel movement early this morning there was some fresh blood noted no nausea vomiting diarrhea  Objective: Vitals:   07/29/24 0001  07/29/24 0340 07/29/24 0721 07/29/24 1123  BP: 119/62 117/63 (!) 166/80 (!) 145/83  Pulse: 62 (!) 57 (!) 56 72  Resp: 16 17 19 19   Temp: 98.8 F (37.1 C) 99.6 F (37.6 C) 97.8 F (36.6 C) 97.9 F (36.6 C)  TempSrc: Oral Oral    SpO2: 95% 94% 98% 100%  Weight:      Height:        Intake/Output Summary (Last 24 hours) at 07/29/2024 1252 Last data filed at 07/29/2024 0500 Gross per 24 hour  Intake 695.52 ml  Output --  Net 695.52 ml   Filed Weights   07/28/24 0005  Weight: 75 kg    Examination:  General exam: Appears in no acute distress Respiratory system: Clear to auscultation. Respiratory effort normal. Cardiovascular system: Regular. Gastrointestinal system: Soft mild tenderness  Central nervous system: Alert and oriented. No focal neurological deficits. Extremities: No edema   Data Reviewed: I have personally reviewed following labs and imaging studies  CBC: Recent Labs  Lab 07/28/24 0024 07/29/24 0332  WBC 11.7* 7.8  HGB 15.2 12.7*  HCT 45.3 36.8*  MCV 88.5 87.2  PLT 426* 352   Basic Metabolic Panel: Recent Labs  Lab 07/28/24 0024 07/28/24 0842 07/29/24 0332  NA 134* 137 137  K 2.7* 3.3* 3.1*  CL 99 104 105  CO2 24 22 22   GLUCOSE 121* 95 86  BUN 8 8 7*  CREATININE 0.91 0.75 0.72  CALCIUM  9.4 8.7* 8.5*  MG 1.8  --   --    GFR: Estimated Creatinine Clearance: 90.8 mL/min (by C-G formula based on SCr of  0.72 mg/dL). Liver Function Tests: Recent Labs  Lab 07/28/24 0024 07/29/24 0332  AST 16 12*  ALT 12 9  ALKPHOS 77 58  BILITOT 0.6 0.7  PROT 7.1 5.6*  ALBUMIN 3.9 3.0*   Recent Labs  Lab 07/28/24 0024 07/28/24 0842  LIPASE 19 21   No results for input(s): AMMONIA in the last 168 hours. Coagulation Profile: No results for input(s): INR, PROTIME in the last 168 hours. Cardiac Enzymes: No results for input(s): CKTOTAL, CKMB, CKMBINDEX, TROPONINI in the last 168 hours. BNP (last 3 results) No results for input(s):  PROBNP in the last 8760 hours. HbA1C: No results for input(s): HGBA1C in the last 72 hours. CBG: No results for input(s): GLUCAP in the last 168 hours. Lipid Profile: No results for input(s): CHOL, HDL, LDLCALC, TRIG, CHOLHDL, LDLDIRECT in the last 72 hours. Thyroid Function Tests: No results for input(s): TSH, T4TOTAL, FREET4, T3FREE, THYROIDAB in the last 72 hours. Anemia Panel: No results for input(s): VITAMINB12, FOLATE, FERRITIN, TIBC, IRON, RETICCTPCT in the last 72 hours. Sepsis Labs: No results for input(s): PROCALCITON, LATICACIDVEN in the last 168 hours.  No results found for this or any previous visit (from the past 240 hours).       Radiology Studies: CT Angio Abd/Pel W and/or Wo Contrast Result Date: 07/28/2024 EXAM: CTA ABDOMEN AND PELVIS WITH AND WITHOUT CONTRAST 07/28/2024 03:09:07 AM TECHNIQUE: CTA images of the abdomen and pelvis with and without intravenous contrast. 75 mL iohexol  (OMNIPAQUE ) 350 MG/ML injection. Three-dimensional MIP/volume rendered formations were performed. Automated exposure control, iterative reconstruction, and/or weight based adjustment of the mA/kV was utilized to reduce the radiation dose to as low as reasonably achievable. COMPARISON: CT 07/21/2024 and same day chest radiograph. CLINICAL HISTORY: Abdominal aortic aneurysm (AAA), follow up; Abdominal aortic aneurysm (AAA), post-op. 100cc [id number]; Abdominal aortic aneurysm (AAA), follow up; Abdominal aortic aneurysm (AAA), post-op. Upper abdominal pain. FINDINGS: VASCULATURE: AORTA: Status post infrarenal aortic aneurysm repair. Maximum sac diameter is again measured as 3.3 cm. No evidence of endoleak. CELIAC TRUNK: No acute finding. No occlusion or significant stenosis. SUPERIOR MESENTERIC ARTERY: No acute finding. No occlusion or significant stenosis. 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 RENAL ARTERIES:  No acute finding. No occlusion or significant stenosis. 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 These are unchanged using similar measuring technique. Similar occlusion of the proximal left internal iliac artery with distal reconstitution. LIVER: Hepatic steatosis. GALLBLADDER AND BILE DUCTS: Gallbladder is unremarkable. No biliary ductal dilatation. SPLEEN: The spleen is unremarkable. PANCREAS: The pancreas is unremarkable. ADRENAL GLANDS: Bilateral adrenal glands demonstrate no acute abnormality. KIDNEYS, URETERS AND BLADDER: No stones in the kidneys or ureters. No hydronephrosis. No perinephric or periureteral stranding. Urinary bladder is unremarkable. GI AND BOWEL: Wall thickening and mucosal hyperenhancement about the Gastric Antrum and duodenum. Hazy stranding about the descending portion of the duodenum. Redemonstrated air and fluid collection medial to the descending duodenum compatible with a small diverticulum or ulceration. Findings are similar to worsened compared to 07/21/2024. No free intraperitoneal air to suggest perforation. There is no bowel obstruction. REPRODUCTIVE: Reproductive organs are unremarkable. PERITONEUM AND RETROPERITONEUM: No abscess or free air. LUNG BASE: No acute abnormality. LYMPH NODES: No lymphadenopathy. BONES AND SOFT TISSUES: Postoperative change left femur. Unchanged compression fracture of T12. No acute soft tissue abnormality. IMPRESSION: 1. Inflamed duodenum with focal outpouching of fluid and gas consistent with peptic ulcer disease  or duodenal diverticulitis. Findings similar to slightly worsened from 07/21/2024. No free intraperitoneal air. 2. Status post infrarenal aortic aneurysm repair with stable sac diameter of 3.3 cm. No evidence of endoleak. 3. 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 Electronically signed by: Norman Gatlin MD 07/28/2024 03:33 AM EDT RP Workstation:  [id number]   DG Chest 2 View Result Date: 07/28/2024 CLINICAL DATA:  Upper abdominal pain. EXAM: CHEST - 2 VIEW COMPARISON:  February 04, 2024 FINDINGS: The heart size and mediastinal contours are within normal limits. Very mild linear atelectasis is seen within the left lung base. No acute infiltrate, pleural effusion or pneumothorax is identified. The visualized skeletal structures are unremarkable. IMPRESSION: No acute cardiopulmonary disease. Electronically Signed   By: Suzen Dials M.D.   On: 07/28/2024 00:54    Scheduled Meds:  feeding supplement  1 Container Oral TID BM   nicotine   21 mg Transdermal Daily   pantoprazole  (PROTONIX ) IV  40 mg Intravenous Q12H   sucralfate   1 g Oral TID WC & HS   Continuous Infusions:  cefTRIAXone (ROCEPHIN)  IV 2 g (07/29/24 0458)   metronidazole 500 mg (07/29/24 0352)     LOS: 1 day    Anthony KANDICE Hoots, MD  07/29/2024, 12:52 PM

## 2024-07-30 ENCOUNTER — Encounter: Payer: Self-pay | Admitting: Physician Assistant

## 2024-07-30 DIAGNOSIS — K5792 Diverticulitis of intestine, part unspecified, without perforation or abscess without bleeding: Secondary | ICD-10-CM | POA: Diagnosis not present

## 2024-07-30 LAB — COMPREHENSIVE METABOLIC PANEL WITH GFR
ALT: 10 U/L (ref 0–44)
AST: 14 U/L — ABNORMAL LOW (ref 15–41)
Albumin: 3.3 g/dL — ABNORMAL LOW (ref 3.5–5.0)
Alkaline Phosphatase: 60 U/L (ref 38–126)
Anion gap: 9 (ref 5–15)
BUN: <5 mg/dL — ABNORMAL LOW (ref 8–23)
CO2: 22 mmol/L (ref 22–32)
Calcium: 8.9 mg/dL (ref 8.9–10.3)
Chloride: 106 mmol/L (ref 98–111)
Creatinine, Ser: 0.66 mg/dL (ref 0.61–1.24)
GFR, Estimated: >60 mL/min (ref >60)
Glucose, Bld: 93 mg/dL (ref 70–99)
Potassium: 3 mmol/L — ABNORMAL LOW (ref 3.5–5.1)
Sodium: 137 mmol/L (ref 135–145)
Total Bilirubin: 0.6 mg/dL (ref 0.0–1.2)
Total Protein: 6 g/dL — ABNORMAL LOW (ref 6.5–8.1)

## 2024-07-30 LAB — CBC
HCT: 38.7 % — ABNORMAL LOW (ref 39.0–52.0)
Hemoglobin: 13.4 g/dL (ref 13.0–17.0)
MCH: 29.5 pg (ref 26.0–34.0)
MCHC: 34.6 g/dL (ref 30.0–36.0)
MCV: 85.2 fL (ref 80.0–100.0)
Platelets: 376 K/uL (ref 150–400)
RBC: 4.54 MIL/uL (ref 4.22–5.81)
RDW: 13.5 % (ref 11.5–15.5)
WBC: 8.7 K/uL (ref 4.0–10.5)
nRBC: 0 % (ref 0.0–0.2)

## 2024-07-30 LAB — POTASSIUM
Potassium: 4 mmol/L (ref 3.5–5.1)
Potassium: 3.9 mmol/L (ref 3.5–5.1)

## 2024-07-30 MED ORDER — POTASSIUM CHLORIDE CRYS ER 20 MEQ PO TBCR
40.0000 meq | EXTENDED_RELEASE_TABLET | ORAL | Status: AC
Start: 2024-07-30 — End: 2024-07-30
  Administered 2024-07-30 (×3): 40 meq via ORAL
  Filled 2024-07-30 (×3): qty 2

## 2024-07-30 MED ORDER — POTASSIUM CHLORIDE 10 MEQ/100ML IV SOLN
10.0000 meq | INTRAVENOUS | Status: AC
  Administered 2024-07-30 (×4): 10 meq via INTRAVENOUS
  Filled 2024-07-30: qty 100

## 2024-07-30 NOTE — Plan of Care (Signed)

## 2024-07-30 NOTE — Progress Notes (Signed)
 PROGRESS NOTE    Anthony Coffey  FMW:[id number] DOB: 1956-06-28 DOA: 07/28/2024 PCP: Pcp, No  Brief Narrative: 68 year old male admitted with abdominal pain, weight loss and decreased appetite.  He has been taking Goody powder for abdominal pain.  He does complain of constipation.  CT abdomen and pelvis in the ED showed inflamed duodenum with focal outpouching of fluid and gas consistent with peptic ulcer disease or duodenal diverticulitis.  These findings are worse since July 21, 2024.  GI and surgery has been consulted.   Assessment & Plan:   Principal Problem:   Diverticulitis Active Problems:   Duodenal ulcer   Abdominal pain, chronic, epigastric   Anal fissure   #1 duodenal ulcer with duodenitis and acute diverticulitis seen by GI and surgery.  Concern for perforation with EGD at high risk. EGD not planned during this admission.  GI recommends to continue PPI sucralfate . Diet advancement per GI  #2 concern for anal fissure with constipation will start stool softeners and try diltiazem ointment  #3 acute diverticulitis pain improved on Rocephin and Flagyl.  #4 hypokalemia replete aggressively recheck magnesium level today received replacement as today for both.k still low.repeat levels today pending.  #5 tobacco abuse continue nicotine  patch    Estimated body mass index is 24.42 kg/m as calculated from the following:   Height as of this encounter: 5' 9 (1.753 m).   Weight as of this encounter: 75 kg.  DVT prophylaxis: None due to concern for GI bleed  code Status: Full code  family Communication: None at bedside  disposition Plan:  Status is: Inpatient Remains inpatient appropriate because: Acute illness   Consultants:  GI and general surgery  Procedures: None Antimicrobials: Rocephin and Flagyl Subjective: K 3.0 Had a bm was loose Objective: Vitals:   07/30/24 0349 07/30/24 0903 07/30/24 1157 07/30/24 1601  BP: 138/60 (!) 153/96 136/77 (!) 154/85   Pulse: 65 67 62 67  Resp: 16 16 18 17   Temp: 98.8 F (37.1 C) 99.2 F (37.3 C) 99.4 F (37.4 C) 98.9 F (37.2 C)  TempSrc: Oral Oral Oral Oral  SpO2: 96% 95% 98% 96%  Weight:      Height:        Intake/Output Summary (Last 24 hours) at 07/30/2024 1638 Last data filed at 07/30/2024 1600 Gross per 24 hour  Intake 596.07 ml  Output 2400 ml  Net -1803.93 ml   Filed Weights   07/28/24 0005  Weight: 75 kg    Examination:  General exam: Appears in no acute distress Respiratory system: Clear to auscultation. Respiratory effort normal. Cardiovascular system: Regular. Gastrointestinal system: Soft mild tenderness  Central nervous system: Alert and oriented. No focal neurological deficits. Extremities: No edema   Data Reviewed: I have personally reviewed following labs and imaging studies  CBC: Recent Labs  Lab 07/28/24 0024 07/29/24 0332 07/30/24 0121  WBC 11.7* 7.8 8.7  HGB 15.2 12.7* 13.4  HCT 45.3 36.8* 38.7*  MCV 88.5 87.2 85.2  PLT 426* 352 376   Basic Metabolic Panel: Recent Labs  Lab 07/28/24 0024 07/28/24 0842 07/29/24 0332 07/29/24 1819 07/30/24 0121  NA 134* 137 137  --  137  K 2.7* 3.3* 3.1*  --  3.0*  CL 99 104 105  --  106  CO2 24 22 22   --  22  GLUCOSE 121* 95 86  --  93  BUN 8 8 7*  --  <5*  CREATININE 0.91 0.75 0.72  --  0.66  CALCIUM  9.4  8.7* 8.5*  --  8.9  MG 1.8  --   --  2.0  --    GFR: Estimated Creatinine Clearance: 90.8 mL/min (by C-G formula based on SCr of 0.66 mg/dL). Liver Function Tests: Recent Labs  Lab 07/28/24 0024 07/29/24 0332 07/30/24 0121  AST 16 12* 14*  ALT 12 9 10   ALKPHOS 77 58 60  BILITOT 0.6 0.7 0.6  PROT 7.1 5.6* 6.0*  ALBUMIN 3.9 3.0* 3.3*   Recent Labs  Lab 07/28/24 0024 07/28/24 0842  LIPASE 19 21   No results for input(s): AMMONIA in the last 168 hours. Coagulation Profile: No results for input(s): INR, PROTIME in the last 168 hours. Cardiac Enzymes: No results for input(s):  CKTOTAL, CKMB, CKMBINDEX, TROPONINI in the last 168 hours. BNP (last 3 results) No results for input(s): PROBNP in the last 8760 hours. HbA1C: No results for input(s): HGBA1C in the last 72 hours. CBG: No results for input(s): GLUCAP in the last 168 hours. Lipid Profile: No results for input(s): CHOL, HDL, LDLCALC, TRIG, CHOLHDL, LDLDIRECT in the last 72 hours. Thyroid Function Tests: No results for input(s): TSH, T4TOTAL, FREET4, T3FREE, THYROIDAB in the last 72 hours. Anemia Panel: No results for input(s): VITAMINB12, FOLATE, FERRITIN, TIBC, IRON, RETICCTPCT in the last 72 hours. Sepsis Labs: No results for input(s): PROCALCITON, LATICACIDVEN in the last 168 hours.  No results found for this or any previous visit (from the past 240 hours).       Radiology Studies: No results found.   Scheduled Meds:  feeding supplement  1 Container Oral TID BM   fiber  1 packet Oral Daily   nicotine   21 mg Transdermal Daily   pantoprazole  (PROTONIX ) IV  40 mg Intravenous Q12H   sucralfate   1 g Oral TID WC & HS   Continuous Infusions:     LOS: 2 days    Almarie KANDICE Hoots, MD  07/30/2024, 4:38 PM

## 2024-07-30 NOTE — Progress Notes (Signed)
 I have contacted the scheduling department in our office.  They will contact patient for a hospital follow-up in 4 to 6 weeks.  Notified by pharmacy yesterday that diltiazem ointment is not on formulary but they are trying to get it compounded and to the hospital ( hopefully today).  He should continue using the diltiazem ointment 3 times daily (just inside anus) for 4 to 6 weeks.

## 2024-07-30 NOTE — Plan of Care (Signed)
   Problem: Education: Goal: Knowledge of General Education information will improve Description: Including pain rating scale, medication(s)/side effects and non-pharmacologic comfort measures Outcome: Progressing   Problem: Clinical Measurements: Goal: Ability to maintain clinical measurements within normal limits will improve Outcome: Progressing   Problem: Nutrition: Goal: Adequate nutrition will be maintained Outcome: Progressing

## 2024-07-30 NOTE — Progress Notes (Signed)
 SDOH Interventions Today    Flowsheet Row Most Recent Value  SDOH Interventions   Food Insecurity Interventions Inpatient TOC  Utilities Interventions Inpatient TOC    Pt denied any issues with food at home or paying his utilities.

## 2024-07-31 DIAGNOSIS — K5792 Diverticulitis of intestine, part unspecified, without perforation or abscess without bleeding: Secondary | ICD-10-CM | POA: Diagnosis not present

## 2024-07-31 LAB — BASIC METABOLIC PANEL WITH GFR
Anion gap: 12 (ref 5–15)
BUN: <5 mg/dL — ABNORMAL LOW (ref 8–23)
CO2: 19 mmol/L — ABNORMAL LOW (ref 22–32)
Calcium: 9.4 mg/dL (ref 8.9–10.3)
Chloride: 109 mmol/L (ref 98–111)
Creatinine, Ser: 0.7 mg/dL (ref 0.61–1.24)
GFR, Estimated: >60 mL/min (ref >60)
Glucose, Bld: 98 mg/dL (ref 70–99)
Potassium: 4.1 mmol/L (ref 3.5–5.1)
Sodium: 140 mmol/L (ref 135–145)

## 2024-07-31 MED ORDER — DILTIAZEM GEL 2 %: 1.0000 | Freq: Two times a day (BID) | CUTANEOUS | 1 refills | Status: DC

## 2024-07-31 MED ORDER — ATORVASTATIN CALCIUM 80 MG PO TABS: 40.0000 mg | ORAL_TABLET | Freq: Every day | ORAL | 0 refills | Status: AC

## 2024-07-31 MED ORDER — TRAMADOL HCL 50 MG PO TABS: 50.0000 mg | ORAL_TABLET | Freq: Four times a day (QID) | ORAL | Status: DC | PRN

## 2024-07-31 MED ORDER — PANTOPRAZOLE SODIUM 40 MG PO TBEC: 40.0000 mg | DELAYED_RELEASE_TABLET | Freq: Two times a day (BID) | ORAL | 4 refills | Status: AC

## 2024-07-31 MED ORDER — PANTOPRAZOLE SODIUM 40 MG PO TBEC
40.0000 mg | DELAYED_RELEASE_TABLET | Freq: Two times a day (BID) | ORAL | Status: DC
  Administered 2024-07-31: 40 mg via ORAL
  Filled 2024-07-31: qty 1

## 2024-07-31 MED ORDER — SUCRALFATE 1 G PO TABS: 1.0000 g | ORAL_TABLET | Freq: Three times a day (TID) | ORAL | 0 refills | Status: DC

## 2024-07-31 MED ORDER — PANTOPRAZOLE SODIUM 40 MG PO TBEC: 40.0000 mg | DELAYED_RELEASE_TABLET | Freq: Two times a day (BID) | ORAL | 4 refills | Status: DC

## 2024-07-31 NOTE — TOC Transition Note (Signed)
 Transition of Care [facility]) - Discharge Note   Patient Details  Name: Anthony Coffey MRN: [id number] Date of Birth: 09-Apr-1956  Transition of Care [facility]) CM/SW Contact:  Roxie KANDICE Stain, RN Phone Number: 07/31/2024, 9:01 AM   Clinical Narrative:    Anthony Coffey is stable to discharge home. Follow up apt on [facility]. No ICM (Inpatient Care Management) needs at this time.    Final next level of care: Home/Self Care Barriers to Discharge: Barriers Resolved   Patient Goals and CMS Choice Patient states their goals for this hospitalization and ongoing recovery are:: return home          Discharge Placement                   home    Discharge Plan and Services Additional resources added to the After Visit Summary for     Discharge Planning Services: CM Consult                                 Social Drivers of Health (SDOH) Interventions SDOH Screenings   Food Insecurity: Patient Declined (07/28/2024)  Housing: Low Risk  (07/28/2024)  Transportation Needs: No Transportation Needs (07/28/2024)  Utilities: Not At Risk (07/28/2024)  Social Connections: Socially Isolated (07/28/2024)  Tobacco Use: High Risk (07/28/2024)     Readmission Risk Interventions    07/31/2024    9:00 AM  Readmission Risk Prevention Plan  Post Dischage Appt Complete  Medication Screening Complete  Transportation Screening Complete

## 2024-07-31 NOTE — Progress Notes (Signed)
 Educated patient that he can not drive under the influence of morphine .  Patient expressed understanding, and called his brother for a ride. Awaiting pick up in his room.  Discharge instructions reviewed with patient.

## 2024-07-31 NOTE — Plan of Care (Signed)
  Problem: Education: Goal: Knowledge of General Education information will improve Description: Including pain rating scale, medication(s)/side effects and non-pharmacologic comfort measures 07/31/2024 1006 by Terryl Lauraine LABOR, RN Outcome: Adequate for Discharge 07/31/2024 1006 by Terryl Lauraine LABOR, RN Outcome: Adequate for Discharge   Problem: Health Behavior/Discharge Planning: Goal: Ability to manage health-related needs will improve 07/31/2024 1006 by Terryl Lauraine LABOR, RN Outcome: Adequate for Discharge 07/31/2024 1006 by Terryl Lauraine LABOR, RN Outcome: Adequate for Discharge   Problem: Clinical Measurements: Goal: Ability to maintain clinical measurements within normal limits will improve 07/31/2024 1006 by Terryl Lauraine LABOR, RN Outcome: Adequate for Discharge 07/31/2024 1006 by Terryl Lauraine LABOR, RN Outcome: Adequate for Discharge Goal: Will remain free from infection 07/31/2024 1006 by Terryl Lauraine LABOR, RN Outcome: Adequate for Discharge 07/31/2024 1006 by Terryl Lauraine LABOR, RN Outcome: Adequate for Discharge Goal: Diagnostic test results will improve 07/31/2024 1006 by Terryl Lauraine LABOR, RN Outcome: Adequate for Discharge 07/31/2024 1006 by Terryl Lauraine LABOR, RN Outcome: Adequate for Discharge Goal: Respiratory complications will improve 07/31/2024 1006 by Terryl Lauraine LABOR, RN Outcome: Adequate for Discharge 07/31/2024 1006 by Terryl Lauraine LABOR, RN Outcome: Adequate for Discharge Goal: Cardiovascular complication will be avoided 07/31/2024 1006 by Terryl Lauraine LABOR, RN Outcome: Adequate for Discharge 07/31/2024 1006 by Terryl Lauraine LABOR, RN Outcome: Adequate for Discharge   Problem: Activity: Goal: Risk for activity intolerance will decrease 07/31/2024 1006 by Terryl Lauraine LABOR, RN Outcome: Adequate for Discharge 07/31/2024 1006 by Terryl Lauraine LABOR, RN Outcome: Adequate for Discharge   Problem: Nutrition: Goal: Adequate nutrition will be maintained 07/31/2024 1006 by Terryl Lauraine LABOR, RN Outcome:  Adequate for Discharge 07/31/2024 1006 by Terryl Lauraine LABOR, RN Outcome: Adequate for Discharge   Problem: Coping: Goal: Level of anxiety will decrease 07/31/2024 1006 by Terryl Lauraine LABOR, RN Outcome: Adequate for Discharge 07/31/2024 1006 by Terryl Lauraine LABOR, RN Outcome: Adequate for Discharge   Problem: Elimination: Goal: Will not experience complications related to bowel motility 07/31/2024 1006 by Terryl Lauraine LABOR, RN Outcome: Adequate for Discharge 07/31/2024 1006 by Terryl Lauraine LABOR, RN Outcome: Adequate for Discharge Goal: Will not experience complications related to urinary retention 07/31/2024 1006 by Terryl Lauraine LABOR, RN Outcome: Adequate for Discharge 07/31/2024 1006 by Terryl Lauraine LABOR, RN Outcome: Adequate for Discharge   Problem: Pain Managment: Goal: General experience of comfort will improve and/or be controlled 07/31/2024 1006 by Terryl Lauraine LABOR, RN Outcome: Adequate for Discharge 07/31/2024 1006 by Terryl Lauraine LABOR, RN Outcome: Adequate for Discharge   Problem: Safety: Goal: Ability to remain free from injury will improve 07/31/2024 1006 by Terryl Lauraine LABOR, RN Outcome: Adequate for Discharge 07/31/2024 1006 by Terryl Lauraine LABOR, RN Outcome: Adequate for Discharge   Problem: Skin Integrity: Goal: Risk for impaired skin integrity will decrease 07/31/2024 1006 by Terryl Lauraine LABOR, RN Outcome: Adequate for Discharge 07/31/2024 1006 by Terryl Lauraine LABOR, RN Outcome: Adequate for Discharge

## 2024-07-31 NOTE — Discharge Summary (Addendum)
 Physician Discharge Summary  Anthony Coffey FMW:[id number] DOB: Oct 08, 1956 DOA: 07/28/2024  PCP: Pcp, No  Admit date: 07/28/2024 Discharge date: 07/31/2024  Admitted From: Home Disposition: Home Recommendations for Outpatient Follow-up:  Follow up with PCP in 1-2 weeks Please obtain BMP/CBC in one week  Home Health: None Equipment/Devices: None Discharge Condition: Stable CODE STATUS: Full code Diet recommendation:cardiac Brief/Interim Summary:  68 year old male admitted with abdominal pain, weight loss and decreased appetite. He has been taking Goody powder for abdominal pain. He does complain of constipation. CT abdomen and pelvis in the ED showed inflamed duodenum with focal outpouching of fluid and gas consistent with peptic ulcer disease or duodenal diverticulitis. These findings are worse since July 21, 2024. GI and surgery was consulted.   Discharge Diagnoses:  Principal Problem:   Diverticulitis Active Problems:   Duodenal ulcer   Abdominal pain, chronic, epigastric   Anal fissure    #1 duodenal ulcer with duodenitis and acute diverticulitis seen by GI and surgery.  Concern for perforation with EGD at high risk. EGD not planned during this admission.  GI recommends to continue PPI sucralfate .  Follow-up with GI on discharge.  Patient tolerated a regular diet prior to discharge.  Encouraged him to stop all NSAIDs and Goody powder.  Advised him to stop smoking. Called in d[facility]em [facility] since [facility] does not make them.   #2 concern for anal fissure with constipation continue stool softeners and diltiazem ointment.  [facility]intment.  I have communicated with [facility] to arrange this.  #3 acute diverticulitis [facility] another 5 days.    #4 hypokalemia this is repleted aggressively with normal potassium on discharge.    #5 tobacco abuse  encouraged him to stop smoking.   Estimated body mass index is 24.42 kg/m as calculated from the following:   Height as of this encounter: 5' 9 (1.753 m).   Weight as of this encounter: 75 kg.  Discharge Instructions  Discharge Instructions     Diet - low sodium heart healthy   Complete by: As directed    Increase activity slowly   Complete by: As directed       Allergies as of 07/31/2024   No Known Allergies      Medication List     STOP taking these medications    nicotine  21 mg/24hr patch Commonly known as: NICODERM CQ  - dosed in mg/24 hours   nitroGLYCERIN  0.4 MG SL tablet Commonly known as: NITROSTAT        TAKE these medications    acetaminophen  500 MG tablet Commonly known as: TYLENOL  Take 1 tablet (500 mg total) by mouth every 6 (six) hours as needed for headache (pain).   atorvastatin  80 MG tablet Commonly known as: LIPITOR  Take 0.5 tablets (40 mg total) by mouth daily. What changed: how much to take   diltiazem 2 % Gel Apply 1 Application topically 2 (two) times daily. Apply just inside anus tid   pantoprazole  40 MG tablet Commonly known as: PROTONIX  Take 1 tablet (40 mg total) by mouth 2 (two) times daily.   sucralfate  1 g tablet Commonly known as: Carafate  Take 1 tablet (1 g total) by mouth 4 (four) times daily -  with meals and at bedtime.        Follow-up Information     [facility] Follow up on 08/12/2024.   Why: Your  appointment is at 9:30 am. Arrive 15 min early and bring a picture ID, insurance card and current medications. Please check lipid panel CBC and CMP Contact information: [address], [place], [place] [address]  Phone: [phone]               No Known Allergies  Consultations: Gi and general surgery   Procedures/Studies: CT Angio Abd/Pel W and/or Wo Contrast Result Date: 07/28/2024 EXAM: CTA ABDOMEN AND PELVIS WITH AND WITHOUT CONTRAST 07/28/2024 03:09:07 AM  TECHNIQUE: CTA images of the abdomen and pelvis with and without intravenous contrast. 75 mL iohexol  (OMNIPAQUE ) 350 MG/ML injection. Three-dimensional MIP/volume rendered formations were performed. Automated exposure control, iterative reconstruction, and/or weight based adjustment of the mA/kV was utilized to reduce the radiation dose to as low as reasonably achievable. COMPARISON: CT 07/21/2024 and same day chest radiograph. CLINICAL HISTORY: Abdominal aortic aneurysm (AAA), follow up; Abdominal aortic aneurysm (AAA), post-op. 100cc [id number]; Abdominal aortic aneurysm (AAA), follow up; Abdominal aortic aneurysm (AAA), post-op. Upper abdominal pain. FINDINGS: VASCULATURE: AORTA: Status post infrarenal aortic aneurysm repair. Maximum sac diameter is again measured as 3.3 cm. No evidence of endoleak. CELIAC TRUNK: No acute finding. No occlusion or significant stenosis. SUPERIOR MESENTERIC ARTERY: No acute finding. No occlusion or significant stenosis. 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 RENAL ARTERIES: No acute finding. No occlusion or significant stenosis. 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 These are unchanged using similar measuring technique. Similar occlusion of the proximal left internal iliac artery with distal reconstitution. LIVER: Hepatic steatosis. GALLBLADDER AND BILE DUCTS: Gallbladder is unremarkable. No biliary ductal dilatation. SPLEEN: The spleen is unremarkable. PANCREAS: The pancreas is unremarkable. ADRENAL GLANDS: Bilateral adrenal glands demonstrate no acute abnormality. KIDNEYS, URETERS AND BLADDER: No stones in the kidneys or ureters. No hydronephrosis. No perinephric or periureteral stranding. Urinary bladder is unremarkable. GI AND BOWEL: Wall thickening and mucosal hyperenhancement about the Gastric Antrum and duodenum. Hazy stranding about the descending portion of the  duodenum. Redemonstrated air and fluid collection medial to the descending duodenum compatible with a small diverticulum or ulceration. Findings are similar to worsened compared to 07/21/2024. No free intraperitoneal air to suggest perforation. There is no bowel obstruction. REPRODUCTIVE: Reproductive organs are unremarkable. PERITONEUM AND RETROPERITONEUM: No abscess or free air. LUNG BASE: No acute abnormality. LYMPH NODES: No lymphadenopathy. BONES AND SOFT TISSUES: Postoperative change left femur. Unchanged compression fracture of T12. No acute soft tissue abnormality. IMPRESSION: 1. Inflamed duodenum with focal outpouching of fluid and gas consistent with peptic ulcer disease or duodenal diverticulitis. Findings similar to slightly worsened from 07/21/2024. No free intraperitoneal air. 2. Status post infrarenal aortic aneurysm repair with stable sac diameter of 3.3 cm. No evidence of endoleak. 3. 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 Electronically signed by: Norman Gatlin MD 07/28/2024 03:33 AM EDT RP Workstation: [id number]   DG Chest 2 View Result Date: 07/28/2024 CLINICAL DATA:  Upper abdominal pain. EXAM: CHEST - 2 VIEW COMPARISON:  February 04, 2024 FINDINGS: The heart size and mediastinal contours are within normal limits. Very mild linear atelectasis is seen within the left lung base. No acute infiltrate, pleural effusion or pneumothorax is identified. The visualized skeletal structures are unremarkable. IMPRESSION: No acute cardiopulmonary disease. Electronically Signed   By: Suzen Dials M.D.   On: 07/28/2024 00:54   CT Angio Abd/Pel W and/or Wo Contrast Result  Date: 07/21/2024 CLINICAL DATA:  Abdominal aortic aneurysm (AAA), post-op hx of repair; abdominal pain EXAM: CTA ABDOMEN AND PELVIS WITHOUT AND WITH CONTRAST TECHNIQUE: Multidetector CT imaging of the abdomen and pelvis was performed using the standard protocol during bolus administration of  intravenous contrast. Multiplanar reconstructed images and MIPs were obtained and reviewed to evaluate the vascular anatomy. RADIATION DOSE REDUCTION: This exam was performed according to the departmental dose-optimization program which includes automated exposure control, adjustment of the mA and/or kV according to patient size and/or use of iterative reconstruction technique. CONTRAST:  OMNIPAQUE  IOHEXOL  350 MG/ML SOLN COMPARISON:  02/02/2024 FINDINGS: VASCULAR Aorta: Status post infrarenal aortic repair. No evidence of aneurysm or leak. Maximum sac diameter 3.3 cm compared to 3.5 cm previously. Celiac: Patent SMA: Patent Renals: Patent bilaterally IMA: Occluded proximally.  Branches reconstitute. Inflow: Bilateral common iliac artery aneurysms, 2.7 cm on the right and 2.9 cm on the left, unchanged. Proximal Outflow: Patent with atherosclerotic calcifications. Veins: No obvious venous abnormality within the limitations of this arterial phase study. Review of the MIP images confirms the above findings. NON-VASCULAR Lower chest: Elevation of the right hemidiaphragm with right base atelectasis. No effusions. Hepatobiliary: Diffuse low-density throughout the liver compatible with fatty infiltration. No focal abnormality. Gallbladder unremarkable. Pancreas: No focal abnormality or ductal dilatation. Spleen: No focal abnormality.  Normal size. Adrenals/Urinary Tract: No suspicious renal or adrenal abnormality. No hydronephrosis. Urinary bladder unremarkable. Stomach/Bowel: Stomach is unremarkable. There is stranding again noted around the descending duodenum. Small amount of air medial to the duodenal lumen could be within a small diverticulum or ulcer. Mucosal enhancement throughout the descending and transverse duodenum. Remainder of the small bowel decompressed unremarkable. Large bowel unremarkable. Normal appendix. Lymphatic: No adenopathy Reproductive: No visible focal abnormality. Other: No free fluid or  free air. Musculoskeletal: No acute bony abnormality. IMPRESSION: VASCULAR Prior infrarenal aortic repair. No evidence of recurrent aneurysm or leak. Stable aneurysmal dilatation of the common iliac arteries bilaterally. NON-VASCULAR Abnormal appearance of the descending duodenum with surrounding haziness/stranding. Small locule of air noted medial to the lumen could be within a diverticulum or ulcer. Mucosal enhancement. Appearance is concerning for duodenitis with possible ulcer. Fatty liver. Electronically Signed   By: Franky Crease M.D.   On: 07/21/2024 01:10   (Echo, Carotid, EGD, Colonoscopy, ERCP)    Subjective: No new c/o Ate a regular diet  Discharge Exam: Vitals:   07/31/24 0347 07/31/24 0734  BP: 122/81 (!) 157/83  Pulse: (!) 59 70  Resp: 16 19  Temp: 98.5 F (36.9 C) 98.8 F (37.1 C)  SpO2: 95% 98%   Vitals:   07/30/24 1933 07/30/24 2319 07/31/24 0347 07/31/24 0734  BP: (!) 155/82 (!) 135/59 122/81 (!) 157/83  Pulse: 61 (!) 59 (!) 59 70  Resp: 18 20 16 19   Temp: 98.5 F (36.9 C) 99.3 F (37.4 C) 98.5 F (36.9 C) 98.8 F (37.1 C)  TempSrc: Oral Oral Oral Oral  SpO2: 98% 96% 95% 98%  Weight:      Height:        General: Pt is alert, awake, not in acute distress Cardiovascular: RRR, S1/S2 +, no rubs, no gallops Respiratory: CTA bilaterally, no wheezing, no rhonchi Abdominal: Soft, NT, ND, bowel sounds + Extremities: no edema, no cyanosis    The results of significant diagnostics from this hospitalization (including imaging, microbiology, ancillary and laboratory) are listed below for reference.     Microbiology: No results found for this or any previous visit (from the  past 240 hours).   Labs: BNP (last 3 results) No results for input(s): BNP in the last 8760 hours. Basic Metabolic Panel: Recent Labs  Lab 07/28/24 0024 07/28/24 0842 07/29/24 0332 07/29/24 1819 07/30/24 0121 07/30/24 1415 07/30/24 1731 07/31/24 0604  NA 134* 137 137  --  137   --   --  140  K 2.7* 3.3* 3.1*  --  3.0* 4.0 3.9 4.1  CL 99 104 105  --  106  --   --  109  CO2 24 22 22   --  22  --   --  19*  GLUCOSE 121* 95 86  --  93  --   --  98  BUN 8 8 7*  --  <5*  --   --  <5*  CREATININE 0.91 0.75 0.72  --  0.66  --   --  0.70  CALCIUM  9.4 8.7* 8.5*  --  8.9  --   --  9.4  MG 1.8  --   --  2.0  --   --   --   --    Liver Function Tests: Recent Labs  Lab 07/28/24 0024 07/29/24 0332 07/30/24 0121  AST 16 12* 14*  ALT 12 9 10   ALKPHOS 77 58 60  BILITOT 0.6 0.7 0.6  PROT 7.1 5.6* 6.0*  ALBUMIN 3.9 3.0* 3.3*   Recent Labs  Lab 07/28/24 0024 07/28/24 0842  LIPASE 19 21   No results for input(s): AMMONIA in the last 168 hours. CBC: Recent Labs  Lab 07/28/24 0024 07/29/24 0332 07/30/24 0121  WBC 11.7* 7.8 8.7  HGB 15.2 12.7* 13.4  HCT 45.3 36.8* 38.7*  MCV 88.5 87.2 85.2  PLT 426* 352 376   Cardiac Enzymes: No results for input(s): CKTOTAL, CKMB, CKMBINDEX, TROPONINI in the last 168 hours. BNP: Invalid input(s): POCBNP CBG: No results for input(s): GLUCAP in the last 168 hours. D-Dimer No results for input(s): DDIMER in the last 72 hours. Hgb A1c No results for input(s): HGBA1C in the last 72 hours. Lipid Profile No results for input(s): CHOL, HDL, LDLCALC, TRIG, CHOLHDL, LDLDIRECT in the last 72 hours. Thyroid function studies No results for input(s): TSH, T4TOTAL, T3FREE, THYROIDAB in the last 72 hours.  Invalid input(s): FREET3 Anemia work up No results for input(s): VITAMINB12, FOLATE, FERRITIN, TIBC, IRON, RETICCTPCT in the last 72 hours. Urinalysis    Component Value Date/Time   COLORURINE YELLOW 07/28/2024 0331   APPEARANCEUR CLEAR 07/28/2024 0331   LABSPEC 1.014 07/28/2024 0331   PHURINE 6.0 07/28/2024 0331   GLUCOSEU NEGATIVE 07/28/2024 0331   HGBUR SMALL (A) 07/28/2024 0331   BILIRUBINUR NEGATIVE 07/28/2024 0331   KETONESUR NEGATIVE 07/28/2024 0331   PROTEINUR  NEGATIVE 07/28/2024 0331   NITRITE NEGATIVE 07/28/2024 0331   LEUKOCYTESUR NEGATIVE 07/28/2024 0331   Sepsis Labs Recent Labs  Lab 07/28/24 0024 07/29/24 0332 07/30/24 0121  WBC 11.7* 7.8 8.7   Microbiology No results found for this or any previous visit (from the past 240 hours).   Time coordinating discharge: 39 min  Almarie KANDICE Hoots, MD  [facility] 07/31/2024, 1:00 PM

## 2024-08-12 ENCOUNTER — Ambulatory Visit (INDEPENDENT_AMBULATORY_CARE_PROVIDER_SITE_OTHER): Payer: Self-pay | Admitting: Family Medicine

## 2024-08-12 ENCOUNTER — Encounter: Payer: Self-pay | Admitting: Family Medicine

## 2024-08-12 VITALS — BP 126/76 | HR 86 | Temp 97.5°F | Ht 69.0 in | Wt 163.4 lb

## 2024-08-12 DIAGNOSIS — K269 Duodenal ulcer, unspecified as acute or chronic, without hemorrhage or perforation: Secondary | ICD-10-CM

## 2024-08-12 DIAGNOSIS — I252 Old myocardial infarction: Secondary | ICD-10-CM | POA: Diagnosis not present

## 2024-08-12 DIAGNOSIS — Z955 Presence of coronary angioplasty implant and graft: Secondary | ICD-10-CM

## 2024-08-12 DIAGNOSIS — K429 Umbilical hernia without obstruction or gangrene: Secondary | ICD-10-CM

## 2024-08-12 DIAGNOSIS — Z9889 Other specified postprocedural states: Secondary | ICD-10-CM

## 2024-08-12 DIAGNOSIS — F17200 Nicotine dependence, unspecified, uncomplicated: Secondary | ICD-10-CM

## 2024-08-12 DIAGNOSIS — I723 Aneurysm of iliac artery: Secondary | ICD-10-CM

## 2024-08-12 MED ORDER — SUCRALFATE 1 G PO TABS: 1.0000 g | ORAL_TABLET | Freq: Three times a day (TID) | ORAL | 1 refills | Status: AC

## 2024-08-12 NOTE — Progress Notes (Signed)
 New Patient Office Visit  Subjective    Patient ID: Anthony Coffey, male    DOB: [birth year]  Age: 68 y.o. MRN: [id number]  CC:  Chief Complaint  Patient presents with   Establish Care    HPI Anthony Coffey presents to establish care.  Hx of previous MI, CAD with coronary stent placement 2018, previous AAA repair, COPD, hypertension, and duodenal ulcer.  07/28/2024 patient admitted to the ED for duodenal ulcer and diverticulitis and discharged on 07/31/24.  Patient had a similar ED admission on 07/20/2024 for duodenal ulcer.  During his last admission GI and surgery was consulted and the decision to not perform surgery was ultimately made.  The patient was taking Goody powder before this most recent ED admission.  The patient was also found to have hypokalemia which was repleted during his hospital stay. Upon discharge GI recommended that the patient continue PPI and sucralfate  and scheduled an outpatient GI follow-up.  CT abd while in the hospital revealed -inflamed duodenum with focal outpouching of fluid and gas consistent with peptic ulcer disease or duodenal diverticulitis.  It also showed status post infrarenal aortic aneurysm repair with stable sac diameter of 3.3 cm with no evidence of endoleak.  It also showed a right 2.8 cm and left 3.4 cm common iliac artery aneurysms that are unchanged compared to prior imaging.   Patient reports that he has not followed up with cardiology in multiple years.  Patient has not had a PCP for multiple years.  Today the patient denies any concerns.Denies any signs of bleeding.    Patient is a current smoker.  Denies alcohol use.  Reports that he understands not to use NSAIDs or Goody's powder.   Needs refill on sucralfate  sent to [facility].     Outpatient Encounter Medications as of 08/12/2024  Medication Sig   acetaminophen  (TYLENOL ) 500 MG tablet Take 1 tablet (500 mg total) by mouth every 6 (six) hours as needed for headache (pain).    atorvastatin  (LIPITOR ) 80 MG tablet Take 0.5 tablets (40 mg total) by mouth daily.   pantoprazole  (PROTONIX ) 40 MG tablet Take 1 tablet (40 mg total) by mouth 2 (two) times daily.   [DISCONTINUED] amoxicillin -clavulanate (AUGMENTIN) 875-125 MG tablet Take 1 tablet by mouth 2 (two) times daily.   [DISCONTINUED] sucralfate  (CARAFATE ) 1 g tablet Take 1 tablet (1 g total) by mouth 4 (four) times daily -  with meals and at bedtime.   sucralfate  (CARAFATE ) 1 g tablet Take 1 tablet (1 g total) by mouth 4 (four) times daily -  with meals and at bedtime.   [DISCONTINUED] diltiazem 2 % GEL Apply 1 Application topically 2 (two) times daily. Apply just inside anus tid   No facility-administered encounter medications on file as of 08/12/2024.    Past Medical History:  Diagnosis Date   Aneurysm    Anxiety    CAD (coronary artery disease)    02/01/17 Cath DES--> OM, staged PCI DES -->mLAD, EF 55%   COPD (chronic obstructive pulmonary disease) (HCC)    smoker   Hypertension    high with anxiety   Myocardial infarction [facility])     Past Surgical History:  Procedure Laterality Date   ABDOMINAL AORTIC ANEURYSM REPAIR     CORONARY STENT INTERVENTION N/A 02/01/2017   Procedure: Coronary Stent Intervention;  Surgeon: Anthony LELON Sharps, MD;  Location: [facility] INVASIVE CV LAB;  Service: Cardiovascular;  Laterality: N/A;   CORONARY STENT INTERVENTION N/A 02/03/2017  Procedure: Coronary Stent Intervention;  Surgeon: Anthony Fell, MD;  Location: [facility] INVASIVE CV LAB;  Service: Cardiovascular;  Laterality: N/A;   EAR CYST EXCISION Right 06/14/2013   Procedure: CYST REMOVAL RIGHT MANDIBLE;  Surgeon: Anthony Coffey, DDS;  Location: [facility] OR;  Service: Oral Surgery;  Laterality: Right;   ESOPHAGOGASTRODUODENOSCOPY N/A 02/02/2024   Procedure: EGD (ESOPHAGOGASTRODUODENOSCOPY);  Surgeon: Anthony Anthony LITTIE DOUGLAS, MD;  Location: [facility] ENDOSCOPY;  Service: Gastroenterology;  Laterality: N/A;   INTRAMEDULLARY (IM) NAIL INTERTROCHANTERIC Left  01/22/2020   Procedure: INTRAMEDULLARY (IM) NAIL INTERTROCHANTRIC;  Surgeon: Anthony Selinda Dover, MD;  Location: [facility] OR;  Service: Orthopedics;  Laterality: Left;   IR ANGIOGRAM SELECTIVE EACH ADDITIONAL VESSEL  02/05/2024   IR ANGIOGRAM VISCERAL SELECTIVE  02/05/2024   IR EMBO ART  VEN HEMORR LYMPH EXTRAV  INC GUIDE ROADMAPPING  02/05/2024   IR US  GUIDE VASC ACCESS RIGHT  02/05/2024   LEFT HEART CATH AND CORONARY ANGIOGRAPHY N/A 02/01/2017   Procedure: Left Heart Cath and Coronary Angiography;  Surgeon: Anthony LELON Sharps, MD;  Location: [facility] INVASIVE CV LAB;  Service: Cardiovascular;  Laterality: N/A;   TONSILLECTOMY     TOOTH EXTRACTION N/A 06/14/2013   Procedure: EXTRACTIONS 17, 23, 26, 27, 29, 32;  Surgeon: Anthony Coffey, DDS;  Location: [facility] OR;  Service: Oral Surgery;  Laterality: N/A;   VASCULAR SURGERY      Family History  Problem Relation Age of Onset   Dementia Mother    Cancer - Lung Father    Anxiety disorder Brother     Social History   Socioeconomic History   Marital status: Single    Spouse name: Not on file   Number of children: Not on file   Years of education: Not on file   Highest education level: Not on file  Occupational History   Not on file  Tobacco Use   Smoking status: Every Day    Current packs/day: 1.00    Average packs/day: 1 pack/day for 78.0 years (77.0 ttl pk-yrs)    Types: Cigarettes    Start date: 08/12/1985   Smokeless tobacco: Former  Building services engineer status: Never Used  Substance and Sexual Activity   Alcohol use: No   Drug use: No   Sexual activity: Never  Other Topics Concern   Not on file  Social History Narrative   ** Merged History Encounter **       Social Drivers of Health   Financial Resource Strain: Not on file  Food Insecurity: Patient Declined (07/28/2024)   Hunger Vital Sign    Worried About Running Out of Food in the Last Year: Patient declined    Ran Out of Food in the Last Year: Patient declined  Transportation Needs: No  Transportation Needs (07/28/2024)   PRAPARE - Administrator, Civil Service (Medical): No    Lack of Transportation (Non-Medical): No  Physical Activity: Not on file  Stress: Not on file  Social Connections: Socially Isolated (07/28/2024)   Social Connection and Isolation Panel    Frequency of Communication with Friends and Family: More than three times a week    Frequency of Social Gatherings with Friends and Family: More than three times a week    Attends Religious Services: Never    Database administrator or Organizations: No    Attends Banker Meetings: Never    Marital Status: Divorced  Catering manager Violence: Not At Risk (07/28/2024)   Humiliation, Afraid, Rape,  and Kick questionnaire    Fear of Current or Ex-Partner: No    Emotionally Abused: No    Physically Abused: No    Sexually Abused: No    ROS      Objective    BP 126/76   Pulse 86   Temp (!) 97.5 F (36.4 C)   Ht 5' 9 (1.753 m)   Wt 163 lb 6.4 oz (74.1 kg)   SpO2 97%   BMI 24.13 kg/m   Physical Exam Vitals reviewed.  Constitutional:      Appearance: Normal appearance.  HENT:     Head: Normocephalic and atraumatic.  Eyes:     Extraocular Movements: Extraocular movements intact.     Conjunctiva/sclera: Conjunctivae normal.     Pupils: Pupils are equal, round, and reactive to light.  Cardiovascular:     Rate and Rhythm: Normal rate and regular rhythm.     Pulses: Normal pulses.     Heart sounds: Normal heart sounds. No murmur heard. Pulmonary:     Effort: Pulmonary effort is normal.     Breath sounds: Normal breath sounds.  Abdominal:     Hernia: A hernia is present.     Comments: Umbilical hernia present, easily reducible.  No erythema.  No pain with palpation.  Patient denies the hernia ever causing pain.  Musculoskeletal:        General: No deformity. Normal range of motion.     Cervical back: Normal range of motion.  Skin:    General: Skin is warm and dry.      Capillary Refill: Capillary refill takes less than 2 seconds.     Comments: Well-healed vertical/midline scar to the abdomen.  Neurological:     General: No focal deficit present.     Mental Status: He is alert and oriented to person, place, and time.  Psychiatric:        Mood and Affect: Mood normal.        Behavior: Behavior normal.         Assessment & Plan:   Problem List Items Addressed This Visit       Digestive   Duodenal ulcer - Primary   Relevant Medications   sucralfate  (CARAFATE ) 1 g tablet   Other Relevant Orders   Basic Metabolic Panel   CBC     Other   History of AAA (abdominal aortic aneurysm) repair   Relevant Orders   Ambulatory referral to Vascular Surgery   Other Visit Diagnoses       History of MI (myocardial infarction)       Relevant Orders   Ambulatory referral to Cardiology     H/O heart artery stent       Relevant Orders   Ambulatory referral to Cardiology     Iliac artery aneurysm, bilateral       Relevant Orders   Ambulatory referral to Vascular Surgery     Umbilical hernia without obstruction and without gangrene         Current smoker          GI appointment -scheduled for 09/10/2024.  Refilled sucralfate  today.  Patient to also continue Protonix  and Lipitor .  BMP and CBC obtained today per ED discharge notes request, results pending.   Hx of MI and cardiac stent placement - patient has not followed up with cardiology in multiple years.  Placed referral to cardiology.  Hx AAA repair and CT results positive for bilateral iliac artery aneurysms.  Placed referral to vascular  surgery for evaluation.  Patient to seek immediate medical attention for any signs of bleeding.  Discussed importance of smoking cessation.  Patient reports that he is not ready at this time to stop smoking.  Return in about 1 month (around 09/12/2024) for physical.   Oneil LELON Severin, FNP

## 2024-08-13 ENCOUNTER — Ambulatory Visit: Payer: Self-pay | Admitting: Family Medicine

## 2024-08-13 LAB — BASIC METABOLIC PANEL WITH GFR
BUN/Creatinine Ratio: 8 — ABNORMAL LOW (ref 10–24)
BUN: 8 mg/dL (ref 8–27)
CO2: 23 mmol/L (ref 20–29)
Calcium: 9.3 mg/dL (ref 8.6–10.2)
Chloride: 101 mmol/L (ref 96–106)
Creatinine, Ser: 0.96 mg/dL (ref 0.76–1.27)
Glucose: 143 mg/dL — ABNORMAL HIGH (ref 70–99)
Potassium: 3.6 mmol/L (ref 3.5–5.2)
Sodium: 141 mmol/L (ref 134–144)
eGFR: 87 mL/min/1.73 (ref >59)

## 2024-08-13 LAB — CBC
Hematocrit: 41.1 % (ref 37.5–51.0)
Hemoglobin: 13.5 g/dL (ref 13.0–17.7)
MCH: 29.5 pg (ref 26.6–33.0)
MCHC: 32.8 g/dL (ref 31.5–35.7)
MCV: 90 fL (ref 79–97)
Platelets: 333 x10E3/uL (ref 150–450)
RBC: 4.57 x10E6/uL (ref 4.14–5.80)
RDW: 13.5 % (ref 11.6–15.4)
WBC: 8.5 x10E3/uL (ref 3.4–10.8)

## 2024-09-08 NOTE — Progress Notes (Deleted)
 Chief Complaint: Follow-up hospitalization for abdominal pain and duodenal ulcer  HPI:    Mr. Anthony Coffey is a 68 year old Caucasian male with a past medical history as listed below, and known to Dr. Legrand from recent hospitalization, who presents to clinic today for follow-up of his abdominal pain and duodenal ulcer.    01/2024 patient seen in the hospital by us  for GI bleed in the setting of heavy NSAID use, EGD with a cratered duodenal ulcer adherent clot.    07/20/2024 ER visit for evaluation of worsening abdominal pain, CT angio showed abnormal appearance of the descending duodenum with surrounding haziness/stranding.  Small locule of air noted medial to the lumen which could be diverticulum or ulcer.  Parents concerning for duodenitis with possible ulcer.  Discharged home with Carafate  and Pantoprazole .    07/27/2024 Anthony Coffey presented to the ER with right upper quadrant pain.  Also nausea.  Still using Goody powders.    07/28/2024 patient consulted by our service for abdominal pain.  Noted history of GI bleed secondary to large duodenal ulcer in the setting of NSAIDs in April 2025, readmitted with nausea, weight loss and upper abdominal pain and a CT scan showing inflamed duodenum with focal outpouching of fluid and gas consistent with peptic ulcer disease or duodenal diverticulitis.  At the time CT findings worse than 07/21/2024.  At that time continued on Carafate  AC and at bedtime as well as a twice daily PPI and surgery was consulted.  Labs with a hemoglobin of 15.5.  At that time no plan for repeat EGD as patient was not bleeding.  Given Carafate  AC and at bedtime.  Discussed that if he is improving clinically after 8 weeks of high-dose PPI then could repeat EGD to evaluate for ulcer healing.  Recent imaging: CT AP with contrast IMPRESSION: 1. Inflamed duodenum with focal outpouching of fluid and gas consistent with peptic ulcer disease or duodenal diverticulitis. Findings similar to slightly worsened  from 07/21/2024. No free intraperitoneal air. 2. Status post infrarenal aortic aneurysm repair with stable sac diameter of 3.3 cm. No evidence of endoleak. 3. 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     Past Medical History:  Diagnosis Date   Aneurysm    Anxiety    CAD (coronary artery disease)    02/01/17 Cath DES--> OM, staged PCI DES -->mLAD, EF 55%   COPD (chronic obstructive pulmonary disease) (HCC)    smoker   Hypertension    high with anxiety   Myocardial infarction [facility])     Past Surgical History:  Procedure Laterality Date   ABDOMINAL AORTIC ANEURYSM REPAIR     CORONARY STENT INTERVENTION N/A 02/01/2017   Procedure: Coronary Stent Intervention;  Surgeon: Victory LELON Sharps, MD;  Location: [facility] INVASIVE CV LAB;  Service: Cardiovascular;  Laterality: N/A;   CORONARY STENT INTERVENTION N/A 02/03/2017   Procedure: Coronary Stent Intervention;  Surgeon: Ozell Fell, MD;  Location: [facility] INVASIVE CV LAB;  Service: Cardiovascular;  Laterality: N/A;   EAR CYST EXCISION Right 06/14/2013   Procedure: CYST REMOVAL RIGHT MANDIBLE;  Surgeon: Glendia CHRISTELLA Primrose, DDS;  Location: [facility] OR;  Service: Oral Surgery;  Laterality: Right;   ESOPHAGOGASTRODUODENOSCOPY N/A 02/02/2024   Procedure: EGD (ESOPHAGOGASTRODUODENOSCOPY);  Surgeon: Legrand Victory LITTIE DOUGLAS, MD;  Location: [facility] ENDOSCOPY;  Service: Gastroenterology;  Laterality: N/A;   INTRAMEDULLARY (IM) NAIL INTERTROCHANTERIC Left 01/22/2020   Procedure: INTRAMEDULLARY (IM) NAIL INTERTROCHANTRIC;  Surgeon: Sharl Selinda Dover, MD;  Location: [facility] OR;  Service:  Orthopedics;  Laterality: Left;   IR ANGIOGRAM SELECTIVE EACH ADDITIONAL VESSEL  02/05/2024   IR ANGIOGRAM VISCERAL SELECTIVE  02/05/2024   IR EMBO ART  VEN HEMORR LYMPH EXTRAV  INC GUIDE ROADMAPPING  02/05/2024   IR US  GUIDE VASC ACCESS RIGHT  02/05/2024   LEFT HEART CATH AND CORONARY ANGIOGRAPHY N/A 02/01/2017   Procedure: Left Heart Cath and Coronary Angiography;  Surgeon: Victory LELON Sharps, MD;  Location: [facility] INVASIVE CV LAB;  Service: Cardiovascular;  Laterality: N/A;   TONSILLECTOMY     TOOTH EXTRACTION N/A 06/14/2013   Procedure: EXTRACTIONS 17, 23, 26, 27, 29, 32;  Surgeon: Glendia CHRISTELLA Primrose, DDS;  Location: [facility] OR;  Service: Oral Surgery;  Laterality: N/A;   VASCULAR SURGERY      Current Outpatient Medications  Medication Sig Dispense Refill   acetaminophen  (TYLENOL ) 500 MG tablet Take 1 tablet (500 mg total) by mouth every 6 (six) hours as needed for headache (pain). 30 tablet 0   atorvastatin  (LIPITOR ) 80 MG tablet Take 0.5 tablets (40 mg total) by mouth daily. 90 tablet 0   pantoprazole  (PROTONIX ) 40 MG tablet Take 1 tablet (40 mg total) by mouth 2 (two) times daily. 180 tablet 4   sucralfate  (CARAFATE ) 1 g tablet Take 1 tablet (1 g total) by mouth 4 (four) times daily -  with meals and at bedtime. 90 tablet 1   No current facility-administered medications for this visit.    Allergies as of 09/10/2024   (No Known Allergies)    Family History  Problem Relation Age of Onset   Dementia Mother    Cancer - Lung Father    Anxiety disorder Brother     Social History   Socioeconomic History   Marital status: Single    Spouse name: Not on file   Number of children: Not on file   Years of education: Not on file   Highest education level: Not on file  Occupational History   Not on file  Tobacco Use   Smoking status: Every Day    Current packs/day: 1.00    Average packs/day: 1 pack/day for 78.1 years (77.1 ttl pk-yrs)    Types: Cigarettes    Start date: 08/12/1985   Smokeless tobacco: Former  Building Services Engineer status: Never Used  Substance and Sexual Activity   Alcohol use: No   Drug use: No   Sexual activity: Never  Other Topics Concern   Not on file  Social History Narrative   ** Merged History Encounter **       Social Drivers of Health   Financial Resource Strain: Not on file  Food Insecurity: Patient Declined (07/28/2024)   Hunger Vital  Sign    Worried About Running Out of Food in the Last Year: Patient declined    Ran Out of Food in the Last Year: Patient declined  Transportation Needs: No Transportation Needs (07/28/2024)   PRAPARE - Administrator, Civil Service (Medical): No    Lack of Transportation (Non-Medical): No  Physical Activity: Not on file  Stress: Not on file  Social Connections: Socially Isolated (07/28/2024)   Social Connection and Isolation Panel    Frequency of Communication with Friends and Family: More than three times a week    Frequency of Social Gatherings with Friends and Family: More than three times a week    Attends Religious Services: Never    Database Administrator or Organizations: No    Attends [employer]  or Organization Meetings: Never    Marital Status: Divorced  Catering Manager Violence: Not At Risk (07/28/2024)   Humiliation, Afraid, Rape, and Kick questionnaire    Fear of Current or Ex-Partner: No    Emotionally Abused: No    Physically Abused: No    Sexually Abused: No    Review of Systems:    Constitutional: No weight loss, fever, chills, weakness or fatigue HEENT: Eyes: No change in vision               Ears, Nose, Throat:  No change in hearing or congestion Skin: No rash or itching Cardiovascular: No chest pain, chest pressure or palpitations   Respiratory: No SOB or cough Gastrointestinal: See HPI and otherwise negative Genitourinary: No dysuria or change in urinary frequency Neurological: No headache, dizziness or syncope Musculoskeletal: No new muscle or joint pain Hematologic: No bleeding or bruising Psychiatric: No history of depression or anxiety    Physical Exam:  Vital signs: There were no vitals taken for this visit.  Constitutional:   Pleasant Caucasian male appears to be in NAD, Well developed, Well nourished, alert and cooperative Head:  Normocephalic and atraumatic. Eyes:   PEERL, EOMI. No icterus. Conjunctiva pink. Ears:  Normal auditory  acuity. Neck:  Supple Throat: Oral cavity and pharynx without inflammation, swelling or lesion.  Respiratory: Respirations even and unlabored. Lungs clear to auscultation bilaterally.   No wheezes, crackles, or rhonchi.  Cardiovascular: Normal S1, S2. No MRG. Regular rate and rhythm. No peripheral edema, cyanosis or pallor.  Gastrointestinal:  Soft, nondistended, nontender. No rebound or guarding. Normal bowel sounds. No appreciable masses or hepatomegaly. Rectal:  Not performed.  Msk:  Symmetrical without gross deformities. Without edema, no deformity or joint abnormality.  Neurologic:  Alert and  oriented x4;  grossly normal neurologically.  Skin:   Dry and intact without significant lesions or rashes. Psychiatric: Oriented to person, place and time. Demonstrates good judgement and reason without abnormal affect or behaviors.  RELEVANT LABS AND IMAGING: CBC    Component Value Date/Time   WBC 8.5 08/12/2024 1006   WBC 8.7 07/30/2024 0121   RBC 4.57 08/12/2024 1006   RBC 4.54 07/30/2024 0121   HGB 13.5 08/12/2024 1006   HCT 41.1 08/12/2024 1006   PLT 333 08/12/2024 1006   MCV 90 08/12/2024 1006   MCH 29.5 08/12/2024 1006   MCH 29.5 07/30/2024 0121   MCHC 32.8 08/12/2024 1006   MCHC 34.6 07/30/2024 0121   RDW 13.5 08/12/2024 1006   LYMPHSABS 1.5 07/20/2024 2359   LYMPHSABS 1.9 09/28/2019 0844   MONOABS 0.4 07/20/2024 2359   EOSABS 0.3 07/20/2024 2359   EOSABS 0.3 09/28/2019 0844   BASOSABS 0.1 07/20/2024 2359   BASOSABS 0.1 09/28/2019 0844    CMP     Component Value Date/Time   NA 141 08/12/2024 1006   K 3.6 08/12/2024 1006   CL 101 08/12/2024 1006   CO2 23 08/12/2024 1006   GLUCOSE 143 (H) 08/12/2024 1006   GLUCOSE 98 07/31/2024 0604   BUN 8 08/12/2024 1006   CREATININE 0.96 08/12/2024 1006   CALCIUM  9.3 08/12/2024 1006   PROT 6.0 (L) 07/30/2024 0121   PROT 6.7 01/03/2020 0750   ALBUMIN 3.3 (L) 07/30/2024 0121   ALBUMIN 4.5 01/03/2020 0750   AST 14 (L) 07/30/2024  0121   ALT 10 07/30/2024 0121   ALKPHOS 60 07/30/2024 0121   BILITOT 0.6 07/30/2024 0121   BILITOT <0.2 01/03/2020 0750   GFRNONAA >60  07/31/2024 0604   GFRAA >60 01/22/2020 1029    Assessment: 1.  History of duodenal ulcer: Recent admission 07/28/2024 with CT showing slight worsening from 07/17/2024, no free air 2.  CAD 3.  COPD  Plan: 1. ***     Delon Failing, PA-C [facility] 09/08/2024, 3:27 PM  Cc: No ref. provider found

## 2024-09-10 ENCOUNTER — Ambulatory Visit: Admitting: Physician Assistant

## 2024-09-14 ENCOUNTER — Encounter: Payer: Self-pay | Admitting: Family Medicine

## 2024-11-23 ENCOUNTER — Emergency Department (HOSPITAL_COMMUNITY)

## 2024-11-23 ENCOUNTER — Other Ambulatory Visit: Payer: Self-pay

## 2024-11-23 ENCOUNTER — Inpatient Hospital Stay (HOSPITAL_COMMUNITY)
Admission: EM | Admit: 2024-11-23 | Discharge: 2024-11-26 | DRG: 384 | Disposition: A | Discharge status: Home / Self Care | Attending: Internal Medicine | Admitting: Internal Medicine

## 2024-11-23 DIAGNOSIS — I10 Essential (primary) hypertension: Secondary | ICD-10-CM | POA: Diagnosis present

## 2024-11-23 DIAGNOSIS — I252 Old myocardial infarction: Secondary | ICD-10-CM

## 2024-11-23 DIAGNOSIS — K269 Duodenal ulcer, unspecified as acute or chronic, without hemorrhage or perforation: Principal | ICD-10-CM | POA: Diagnosis present

## 2024-11-23 DIAGNOSIS — K2289 Other specified disease of esophagus: Secondary | ICD-10-CM | POA: Diagnosis present

## 2024-11-23 DIAGNOSIS — Z818 Family history of other mental and behavioral disorders: Secondary | ICD-10-CM

## 2024-11-23 DIAGNOSIS — Z604 Social exclusion and rejection: Secondary | ICD-10-CM | POA: Diagnosis present

## 2024-11-23 DIAGNOSIS — Z720 Tobacco use: Secondary | ICD-10-CM | POA: Diagnosis present

## 2024-11-23 DIAGNOSIS — T471X6D Underdosing of other antacids and anti-gastric-secretion drugs, subsequent encounter: Secondary | ICD-10-CM

## 2024-11-23 DIAGNOSIS — R6881 Early satiety: Secondary | ICD-10-CM | POA: Diagnosis present

## 2024-11-23 DIAGNOSIS — F419 Anxiety disorder, unspecified: Secondary | ICD-10-CM | POA: Diagnosis present

## 2024-11-23 DIAGNOSIS — F32A Depression, unspecified: Secondary | ICD-10-CM | POA: Diagnosis present

## 2024-11-23 DIAGNOSIS — Z91128 Patient's intentional underdosing of medication regimen for other reason: Secondary | ICD-10-CM

## 2024-11-23 DIAGNOSIS — K2980 Duodenitis without bleeding: Secondary | ICD-10-CM | POA: Diagnosis present

## 2024-11-23 DIAGNOSIS — K2970 Gastritis, unspecified, without bleeding: Secondary | ICD-10-CM

## 2024-11-23 DIAGNOSIS — Z955 Presence of coronary angioplasty implant and graft: Secondary | ICD-10-CM

## 2024-11-23 DIAGNOSIS — Z95828 Presence of other vascular implants and grafts: Secondary | ICD-10-CM

## 2024-11-23 DIAGNOSIS — Z716 Tobacco abuse counseling: Secondary | ICD-10-CM

## 2024-11-23 DIAGNOSIS — Z79899 Other long term (current) drug therapy: Secondary | ICD-10-CM

## 2024-11-23 DIAGNOSIS — R109 Unspecified abdominal pain: Secondary | ICD-10-CM | POA: Diagnosis present

## 2024-11-23 DIAGNOSIS — Z8679 Personal history of other diseases of the circulatory system: Secondary | ICD-10-CM

## 2024-11-23 DIAGNOSIS — K5900 Constipation, unspecified: Secondary | ICD-10-CM | POA: Diagnosis present

## 2024-11-23 DIAGNOSIS — F17210 Nicotine dependence, cigarettes, uncomplicated: Secondary | ICD-10-CM | POA: Diagnosis present

## 2024-11-23 DIAGNOSIS — Z9889 Other specified postprocedural states: Secondary | ICD-10-CM

## 2024-11-23 DIAGNOSIS — J449 Chronic obstructive pulmonary disease, unspecified: Secondary | ICD-10-CM | POA: Diagnosis present

## 2024-11-23 DIAGNOSIS — K3189 Other diseases of stomach and duodenum: Secondary | ICD-10-CM | POA: Diagnosis present

## 2024-11-23 DIAGNOSIS — I2510 Atherosclerotic heart disease of native coronary artery without angina pectoris: Secondary | ICD-10-CM | POA: Diagnosis present

## 2024-11-23 DIAGNOSIS — I723 Aneurysm of iliac artery: Secondary | ICD-10-CM | POA: Diagnosis present

## 2024-11-23 LAB — CBC WITH DIFFERENTIAL/PLATELET
Abs Immature Granulocytes: 0.04 10*3/uL (ref 0.00–0.07)
Abs Immature Granulocytes: 0.04 10*3/uL (ref 0.00–0.07)
Basophils Absolute: 0.1 10*3/uL (ref 0.0–0.1)
Basophils Absolute: 0.1 10*3/uL (ref 0.0–0.1)
Basophils Relative: 1 %
Basophils Relative: 1 %
Eosinophils Absolute: 0 10*3/uL (ref 0.0–0.5)
Eosinophils Absolute: 0 10*3/uL (ref 0.0–0.5)
Eosinophils Relative: 0 %
Eosinophils Relative: 0 %
HCT: 48.8 % (ref 39.0–52.0)
HCT: 41.3 % (ref 39.0–52.0)
Hemoglobin: 16.2 g/dL (ref 13.0–17.0)
Hemoglobin: 13.2 g/dL (ref 13.0–17.0)
Immature Granulocytes: 1 %
Immature Granulocytes: 1 %
Lymphocytes Relative: 17 %
Lymphocytes Relative: 18 %
Lymphs Abs: 1.4 10*3/uL (ref 0.7–4.0)
Lymphs Abs: 1.5 10*3/uL (ref 0.7–4.0)
MCH: 29.6 pg (ref 26.0–34.0)
MCH: 29.3 pg (ref 26.0–34.0)
MCHC: 33.2 g/dL (ref 30.0–36.0)
MCHC: 32 g/dL (ref 30.0–36.0)
MCV: 89.1 fL (ref 80.0–100.0)
MCV: 91.8 fL (ref 80.0–100.0)
Monocytes Absolute: 0.3 10*3/uL (ref 0.1–1.0)
Monocytes Absolute: 0.3 10*3/uL (ref 0.1–1.0)
Monocytes Relative: 3 %
Monocytes Relative: 4 %
Neutro Abs: 6.6 10*3/uL (ref 1.7–7.7)
Neutro Abs: 6.4 10*3/uL (ref 1.7–7.7)
Neutrophils Relative %: 78 %
Neutrophils Relative %: 76 %
Platelets: 463 10*3/uL — ABNORMAL HIGH (ref 150–400)
Platelets: 374 10*3/uL (ref 150–400)
RBC: 5.48 MIL/uL (ref 4.22–5.81)
RBC: 4.5 MIL/uL (ref 4.22–5.81)
RDW: 14 % (ref 11.5–15.5)
RDW: 13.8 % (ref 11.5–15.5)
WBC: 8.4 10*3/uL (ref 4.0–10.5)
WBC: 8.4 10*3/uL (ref 4.0–10.5)
nRBC: 0 % (ref 0.0–0.2)
nRBC: 0 % (ref 0.0–0.2)

## 2024-11-23 LAB — TROPONIN T, HIGH SENSITIVITY
Troponin T High Sensitivity: 13 ng/L (ref 0–19)
Troponin T High Sensitivity: 12 ng/L (ref 0–19)

## 2024-11-23 LAB — COMPREHENSIVE METABOLIC PANEL WITH GFR
ALT: 10 U/L (ref 0–44)
AST: 22 U/L (ref 15–41)
Albumin: 4.5 g/dL (ref 3.5–5.0)
Alkaline Phosphatase: 100 U/L (ref 38–126)
Anion gap: 15 (ref 5–15)
BUN: 12 mg/dL (ref 8–23)
CO2: 22 mmol/L (ref 22–32)
Calcium: 9.4 mg/dL (ref 8.9–10.3)
Chloride: 100 mmol/L (ref 98–111)
Creatinine, Ser: 0.77 mg/dL (ref 0.61–1.24)
GFR, Estimated: >60 mL/min
Glucose, Bld: 106 mg/dL — ABNORMAL HIGH (ref 70–99)
Potassium: 4.8 mmol/L (ref 3.5–5.1)
Sodium: 136 mmol/L (ref 135–145)
Total Bilirubin: 0.5 mg/dL (ref 0.0–1.2)
Total Protein: 7.5 g/dL (ref 6.5–8.1)

## 2024-11-23 LAB — URINALYSIS, ROUTINE W REFLEX MICROSCOPIC
Bacteria, UA: NONE SEEN
Bilirubin Urine: NEGATIVE
Glucose, UA: NEGATIVE mg/dL
Ketones, ur: 5 mg/dL — AB
Leukocytes,Ua: NEGATIVE
Nitrite: NEGATIVE
Protein, ur: NEGATIVE mg/dL
Specific Gravity, Urine: 1.031 — ABNORMAL HIGH (ref 1.005–1.030)
pH: 6 (ref 5.0–8.0)

## 2024-11-23 LAB — LIPASE, BLOOD: Lipase: 18 U/L (ref 11–51)

## 2024-11-23 MED ORDER — ONDANSETRON HCL 4 MG PO TABS: 4.0000 mg | ORAL_TABLET | Freq: Four times a day (QID) | ORAL | Status: DC | PRN

## 2024-11-23 MED ORDER — ACETAMINOPHEN 650 MG RE SUPP: 650.0000 mg | Freq: Four times a day (QID) | RECTAL | Status: DC | PRN

## 2024-11-23 MED ORDER — SUCRALFATE 1 G PO TABS
1.0000 g | ORAL_TABLET | Freq: Three times a day (TID) | ORAL | Status: DC
  Administered 2024-11-23 – 2024-11-26 (×11): 1 g via ORAL
  Filled 2024-11-23 (×12): qty 1

## 2024-11-23 MED ORDER — ATORVASTATIN CALCIUM 40 MG PO TABS
40.0000 mg | ORAL_TABLET | Freq: Every day | ORAL | Status: DC
  Administered 2024-11-23 – 2024-11-24 (×2): 40 mg via ORAL
  Filled 2024-11-23 (×2): qty 1

## 2024-11-23 MED ORDER — ONDANSETRON HCL 4 MG/2ML IJ SOLN: 4.0000 mg | Freq: Four times a day (QID) | INTRAMUSCULAR | Status: DC | PRN

## 2024-11-23 MED ORDER — POLYETHYLENE GLYCOL 3350 17 G PO PACK: 17.0000 g | PACK | Freq: Every day | ORAL | Status: DC | PRN

## 2024-11-23 MED ORDER — MELATONIN 3 MG PO TABS
3.0000 mg | ORAL_TABLET | Freq: Once | ORAL | Status: AC
  Administered 2024-11-23: 3 mg via ORAL
  Filled 2024-11-23: qty 1

## 2024-11-23 MED ORDER — LACTATED RINGERS IV SOLN
INTRAVENOUS | Status: AC
  Administered 2024-11-24: 100 mL/h via INTRAVENOUS

## 2024-11-23 MED ORDER — HYDROMORPHONE HCL 1 MG/ML IJ SOLN
0.5000 mg | INTRAMUSCULAR | Status: DC | PRN
  Administered 2024-11-23: 0.5 mg via INTRAVENOUS
  Administered 2024-11-23 – 2024-11-26 (×22): 1 mg via INTRAVENOUS
  Filled 2024-11-23 (×24): qty 1

## 2024-11-23 MED ORDER — SODIUM CHLORIDE 0.9 % IV BOLUS
1000.0000 mL | Freq: Once | INTRAVENOUS | Status: AC
  Administered 2024-11-23: 1000 mL via INTRAVENOUS

## 2024-11-23 MED ORDER — TRAZODONE HCL 50 MG PO TABS
25.0000 mg | ORAL_TABLET | Freq: Once | ORAL | Status: AC
  Administered 2024-11-23: 25 mg via ORAL
  Filled 2024-11-23: qty 1

## 2024-11-23 MED ORDER — FAMOTIDINE IN NACL 20-0.9 MG/50ML-% IV SOLN
20.0000 mg | Freq: Once | INTRAVENOUS | Status: AC
  Administered 2024-11-23: 20 mg via INTRAVENOUS
  Filled 2024-11-23: qty 50

## 2024-11-23 MED ORDER — BOOST / RESOURCE BREEZE PO LIQD CUSTOM
1.0000 | Freq: Three times a day (TID) | ORAL | Status: DC
  Administered 2024-11-23 – 2024-11-24 (×2): 1 via ORAL

## 2024-11-23 MED ORDER — HYDROMORPHONE HCL 1 MG/ML IJ SOLN
1.0000 mg | Freq: Once | INTRAMUSCULAR | Status: AC
  Administered 2024-11-23: 1 mg via INTRAVENOUS
  Filled 2024-11-23: qty 1

## 2024-11-23 MED ORDER — HYDROMORPHONE HCL 1 MG/ML IJ SOLN
0.5000 mg | Freq: Once | INTRAMUSCULAR | Status: AC
  Administered 2024-11-23: 0.5 mg via INTRAVENOUS
  Filled 2024-11-23: qty 0.5

## 2024-11-23 MED ORDER — HYDROCODONE-ACETAMINOPHEN 5-325 MG PO TABS
1.0000 | ORAL_TABLET | ORAL | Status: DC | PRN
  Administered 2024-11-24: 1 via ORAL
  Administered 2024-11-26: 2 via ORAL
  Filled 2024-11-23: qty 2
  Filled 2024-11-23: qty 1

## 2024-11-23 MED ORDER — IOHEXOL 300 MG/ML  SOLN
100.0000 mL | Freq: Once | INTRAMUSCULAR | Status: AC | PRN
  Administered 2024-11-23: 100 mL via INTRAVENOUS

## 2024-11-23 MED ORDER — ACETAMINOPHEN 325 MG PO TABS: 650.0000 mg | ORAL_TABLET | Freq: Four times a day (QID) | ORAL | Status: DC | PRN

## 2024-11-23 MED ORDER — PANTOPRAZOLE SODIUM 40 MG IV SOLR
40.0000 mg | Freq: Once | INTRAVENOUS | Status: AC
  Administered 2024-11-23: 40 mg via INTRAVENOUS
  Filled 2024-11-23: qty 10

## 2024-11-23 MED ORDER — HYDRALAZINE HCL 20 MG/ML IJ SOLN: 10.0000 mg | Freq: Four times a day (QID) | INTRAMUSCULAR | Status: DC | PRN

## 2024-11-23 NOTE — H&P (Signed)
 " History and Physical    Anthony Coffey FMW:[id number] DOB: Apr 11, 1956 DOA: 11/23/2024  PCP: Alcus Oneil ORN, FNP   Patient coming from: Home  I have personally briefly reviewed patient's old medical records in [facility]  Chief Complaint: Upper abdominal pain  HPI: Anthony Coffey is a 69 y.o. male with medical history significant of duodenal ulcer, CAD status post PCI in 2018, COPD, hypertension, iliac artery aneurysm, presents to the ER with complaints of abdominal pain.  Patient has history of duodenal ulcer in the past and is supposed to take Protonix  but he has not been taking from past several weeks.  He continues to smoke cigarettes.  Over the past week or so he has been having intermittent upper abdominal pain with which is worse with food.  He also reports some decreased appetite.  Denies vomiting.  He says he feels constipated but his bowel movements are normal in color.  With increasing intensity of pain he decided to come to the ER.  ED Course: His vital signs were stable in the ER.  Blood pressure was intermittently elevated, as high as more than 200 mmHg.  Labs sodium 136, potassium 4.8, BUN 12, creatinine 0.77, AST 22, ALT 10, alkaline phosphatase 100, total bili 0.5 troponin T 13 and 12, WBC 8.4, hemoglobin 16.2, platelets 463.  he was given IV Dilaudid , Protonix .  CT of chest abdomen and pelvis was done which revealed stranding around duodenum.  No other acute findings.  Patient admitted for further management given ongoing abdominal pain as well as elevated blood pressure.   Review of Systems: As per HPI otherwise all other systems were reviewed and are negative.   Past Medical History:  Diagnosis Date   Aneurysm    Anxiety    CAD (coronary artery disease)    02/01/17 Cath DES--> OM, staged PCI DES -->mLAD, EF 55%   COPD (chronic obstructive pulmonary disease) (HCC)    smoker   Hypertension    high with anxiety   Myocardial infarction [facility])     Past Surgical  History:  Procedure Laterality Date   ABDOMINAL AORTIC ANEURYSM REPAIR     CORONARY STENT INTERVENTION N/A 02/01/2017   Procedure: Coronary Stent Intervention;  Surgeon: Victory ORN Sharps, MD;  Location: [facility] INVASIVE CV LAB;  Service: Cardiovascular;  Laterality: N/A;   CORONARY STENT INTERVENTION N/A 02/03/2017   Procedure: Coronary Stent Intervention;  Surgeon: Ozell Fell, MD;  Location: [facility] INVASIVE CV LAB;  Service: Cardiovascular;  Laterality: N/A;   EAR CYST EXCISION Right 06/14/2013   Procedure: CYST REMOVAL RIGHT MANDIBLE;  Surgeon: Glendia CHRISTELLA Primrose, DDS;  Location: [facility] OR;  Service: Oral Surgery;  Laterality: Right;   ESOPHAGOGASTRODUODENOSCOPY N/A 02/02/2024   Procedure: EGD (ESOPHAGOGASTRODUODENOSCOPY);  Surgeon: Legrand Victory LITTIE DOUGLAS, MD;  Location: [facility] ENDOSCOPY;  Service: Gastroenterology;  Laterality: N/A;   INTRAMEDULLARY (IM) NAIL INTERTROCHANTERIC Left 01/22/2020   Procedure: INTRAMEDULLARY (IM) NAIL INTERTROCHANTRIC;  Surgeon: Sharl Selinda Dover, MD;  Location: [facility] OR;  Service: Orthopedics;  Laterality: Left;   IR ANGIOGRAM SELECTIVE EACH ADDITIONAL VESSEL  02/05/2024   IR ANGIOGRAM VISCERAL SELECTIVE  02/05/2024   IR EMBO ART  VEN HEMORR LYMPH EXTRAV  INC GUIDE ROADMAPPING  02/05/2024   IR US  GUIDE VASC ACCESS RIGHT  02/05/2024   LEFT HEART CATH AND CORONARY ANGIOGRAPHY N/A 02/01/2017   Procedure: Left Heart Cath and Coronary Angiography;  Surgeon: Victory ORN Sharps, MD;  Location: [facility] INVASIVE CV LAB;  Service: Cardiovascular;  Laterality: N/A;  TONSILLECTOMY     TOOTH EXTRACTION N/A 06/14/2013   Procedure: EXTRACTIONS 17, 23, 26, 27, 29, 32;  Surgeon: Glendia CHRISTELLA Primrose, DDS;  Location: [facility] OR;  Service: Oral Surgery;  Laterality: N/A;   VASCULAR SURGERY       reports that he has been smoking cigarettes. He started smoking about 39 years ago. He has a 77.3 pack-year smoking history. He has quit using smokeless tobacco. He reports that he does not drink alcohol and does not use  drugs.  Allergies[1]  Family History  Problem Relation Age of Onset   Dementia Mother    Cancer - Lung Father    Anxiety disorder Brother     Prior to Admission medications  Medication Sig Start Date End Date Taking? Authorizing Provider  acetaminophen  (TYLENOL ) 500 MG tablet Take 1 tablet (500 mg total) by mouth every 6 (six) hours as needed for headache (pain). 02/04/17   Henry Manuelita NOVAK, NP  atorvastatin  (LIPITOR ) 80 MG tablet Take 0.5 tablets (40 mg total) by mouth daily. 07/31/24   Will Almarie MATSU, MD  pantoprazole  (PROTONIX ) 40 MG tablet Take 1 tablet (40 mg total) by mouth 2 (two) times daily. 07/31/24   Will Almarie MATSU, MD  sucralfate  (CARAFATE ) 1 g tablet Take 1 tablet (1 g total) by mouth 4 (four) times daily -  with meals and at bedtime. 08/12/24   Alcus Oneil ORN, FNP    Physical Exam: Vitals:   11/23/24 1510 11/23/24 1515 11/23/24 1520 11/23/24 1525  BP: (!) 142/78 (!) 140/67 (!) 141/92 (!) 145/98  Pulse: 61 (!) 54 68 65  Resp: (!) 21 20 19 16   Temp:      TempSrc:      SpO2: 96% 96% 96% 98%    Constitutional: NAD, calm, comfortable Vitals:   11/23/24 1510 11/23/24 1515 11/23/24 1520 11/23/24 1525  BP: (!) 142/78 (!) 140/67 (!) 141/92 (!) 145/98  Pulse: 61 (!) 54 68 65  Resp: (!) 21 20 19 16   Temp:      TempSrc:      SpO2: 96% 96% 96% 98%   General: Alert, oriented not in any acute distress HEENT: dry oral mucosa Chest: Clear CVs: S1, S2, no murmur, regular rhythm Abdomen: Soft, epigastric tenderness, no rebound tenderness, bowel sounds present Extremities: No edema  Labs on Admission: I have personally reviewed following labs and imaging studies  CBC: Recent Labs  Lab 11/23/24 1118  WBC 8.4  NEUTROABS 6.6  HGB 16.2  HCT 48.8  MCV 89.1  PLT 463*   Basic Metabolic Panel: Recent Labs  Lab 11/23/24 1118  NA 136  K 4.8  CL 100  CO2 22  GLUCOSE 106*  BUN 12  CREATININE 0.77  CALCIUM  9.4   GFR: CrCl cannot be calculated  (Unknown ideal weight.). Liver Function Tests: Recent Labs  Lab 11/23/24 1118  AST 22  ALT 10  ALKPHOS 100  BILITOT 0.5  PROT 7.5  ALBUMIN 4.5   Recent Labs  Lab 11/23/24 1118  LIPASE 18   No results for input(s): AMMONIA in the last 168 hours. Coagulation Profile: No results for input(s): INR, PROTIME in the last 168 hours. Cardiac Enzymes: No results for input(s): CKTOTAL, CKMB, CKMBINDEX, TROPONINI in the last 168 hours. BNP (last 3 results) No results for input(s): PROBNP in the last 8760 hours. HbA1C: No results for input(s): HGBA1C in the last 72 hours. CBG: No results for input(s): GLUCAP in the last 168 hours. Lipid Profile: No results for  input(s): CHOL, HDL, LDLCALC, TRIG, CHOLHDL, LDLDIRECT in the last 72 hours. Thyroid Function Tests: No results for input(s): TSH, T4TOTAL, FREET4, T3FREE, THYROIDAB in the last 72 hours. Anemia Panel: No results for input(s): VITAMINB12, FOLATE, FERRITIN, TIBC, IRON, RETICCTPCT in the last 72 hours. Urine analysis:    Component Value Date/Time   COLORURINE YELLOW 11/23/2024 1345   APPEARANCEUR CLEAR 11/23/2024 1345   LABSPEC 1.031 (H) 11/23/2024 1345   PHURINE 6.0 11/23/2024 1345   GLUCOSEU NEGATIVE 11/23/2024 1345   HGBUR SMALL (A) 11/23/2024 1345   BILIRUBINUR NEGATIVE 11/23/2024 1345   KETONESUR 5 (A) 11/23/2024 1345   PROTEINUR NEGATIVE 11/23/2024 1345   NITRITE NEGATIVE 11/23/2024 1345   LEUKOCYTESUR NEGATIVE 11/23/2024 1345    Radiological Exams on Admission: CT ABDOMEN PELVIS W CONTRAST Result Date: 11/23/2024 EXAM: CT ABDOMEN AND PELVIS WITH CONTRAST 11/23/2024 12:05:15 PM TECHNIQUE: CT of the abdomen and pelvis was performed with the administration of 100 mL of iohexol  (OMNIPAQUE ) 300 MG/ML solution. Multiplanar reformatted images are provided for review. Automated exposure control, iterative reconstruction, and/or weight-based adjustment of the mA/kV was  utilized to reduce the radiation dose to as low as reasonably achievable. COMPARISON: 07/28/2024 CLINICAL HISTORY: Upper abdominal pain, history of duodenal ulcer. FINDINGS: LOWER CHEST: No acute abnormality. LIVER: Hepatic steatosis. GALLBLADDER AND BILE DUCTS: Gallbladder is unremarkable. No biliary ductal dilatation. SPLEEN: Heterogeneous enhancement of the spleen, which may be due to the phase of contrast. PANCREAS: No acute abnormality. ADRENAL GLANDS: No acute abnormality. KIDNEYS, URETERS AND BLADDER: Unchanged left renal cyst. No stones in the kidneys or ureters. No hydronephrosis. No perinephric or periureteral stranding. The urinary bladder is distended without focal abnormality. Findings consistent with Benign Prostatic Hyperplasia (BPH). GI AND BOWEL: Asymmetric wall thickening of the 1st and 2nd portions of the duodenum with subtle periduodenal inflammatory stranding. Normal decompressed appendix. Scattered colonic diverticulosis. There is no bowel obstruction. PERITONEUM AND RETROPERITONEUM: No ascites. No free air. No new retroperitoneal fluid or periaortic inflammation. Subtle inflammatory stranding may also be present in the region of the left common iliac artery aneurysm. 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 The aneurysm sac measures 3.6 cm in maximal dimension. 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 LYMPH NODES: No lymphadenopathy. REPRODUCTIVE ORGANS: No acute abnormality. BONES AND SOFT TISSUES: Partially visualized left femoral intramedullary nail. Multilevel degenerative disc disease of the spine. Osteopenia. Mild, grade 1 anterolisthesis of L3 on L4, unchanged. No acute osseous abnormality. No focal soft tissue abnormality. IMPRESSION: 1. Asymmetric wall thickening of the 1st and 2nd  portions of the duodenum with subtle periduodenal inflammatory stranding, likely due to an inflamed duodenal ulcer. 2. 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 This may represent changes of impending rupture. No retroperitoneal hematoma. Urgent vascular surgery consultation recommended. Electronically signed by: Rogelia Myers MD 11/23/2024 01:56 PM EST RP Workstation: [id number]   DG Chest Portable 1 View Result Date: 11/23/2024 EXAM: 1 VIEW(S) XRAY OF THE CHEST 11/23/2024 11:13:50 AM COMPARISON: 07/28/2024 CLINICAL HISTORY: Upper abdominal pain. FINDINGS: LUNGS AND PLEURA: Mild elevation of right hemidiaphragm, unchanged. No focal pulmonary opacity. No pleural effusion. No pneumothorax. HEART AND MEDIASTINUM: Atherosclerotic calcifications at aortic arch. No  acute abnormality of the cardiac and mediastinal silhouettes. BONES AND SOFT TISSUES: No acute osseous abnormality. IMPRESSION: 1. No acute findings. Electronically signed by: Waddell Calk MD 11/23/2024 11:35 AM EST RP Workstation: [id number]    EKG: Independently reviewed. Sinus rhythm  Assessment/Plan  69 year old male with history of duodenal ulcer, GI bleeding in the past, history of CAD s/p PCI in 2018, infrarenal AAA comes in with severe upper abdominal discomfort.  Upper abdominal pain History of duodenal ulcer CT with asymmetric wall thickening and stranding around duodenum concerning for duodenitis.  - Patient is noncompliant with Protonix .  He also continues to smoke. -Denies GI bleeding, nausea vomiting - Will start IV Protonix  40 mg twice daily, Carafate  - Hemoglobin is 16, probably hemoconcentrated too. - Liver enzymes, gallbladder, liver unremarkable - Will consult GI for input - Patient denies NSAIDs use  Infrarenal AAA Aneurysm left common iliac, size increased from  prior.  - ED provider spoke with vascular surgery, no need for acute intervention or transfer to [facility].  Outpatient follow-up  History of CAD status post PCI. -Patient is not on aspirin  or beta-blocker.  Continue statin  Smoker: Strongly advised  smoking cessation.   hypertension: Blood pressure is high as 200 mmHg in the ER.  This is improved with IV Dilaudid .  Will monitor closely.  Patient does not appear to be on any antihypertensives at home   DVT prophylaxis: scds, ambulatory  Code Status: full  Family Communication: none  Disposition Plan: med surg  Consults called: GI Admission status: obs   Severity of Illness: The appropriate patient status for this patient is OBSERVATION. Observation status is judged to be reasonable and necessary in order to provide the required intensity of service to ensure the patient's safety. The patient's presenting symptoms, physical exam findings, and initial radiographic and laboratory data in the context of their medical condition is felt to place them at decreased risk for further clinical deterioration. Furthermore, it is anticipated that the patient will be medically stable for discharge from the hospital within 2 midnights of admission.     Derryl Duval MD [facility]  11/23/2024, 3:36 PM        [1] No Known Allergies  "

## 2024-11-23 NOTE — ED Notes (Signed)
 "  Pt back to room   "

## 2024-11-23 NOTE — ED Notes (Signed)
 ED Provider at bedside.

## 2024-11-23 NOTE — ED Triage Notes (Signed)
 Patient BIB [facility] for abdominal pain that started yesterday, pain that is burning and shooting. Hx of diverticulitis, and stomach ulcer and ran out of his medication about  a month ago. Pain worse when eating, denies N/V.

## 2024-11-23 NOTE — ED Provider Notes (Signed)
 " [facility] Provider Note   CSN: [id number] Arrival date & time: 11/23/24  1049     Patient presents with: Abdominal Pain   Anthony Coffey is a 69 y.o. male.   HPI 69 year old male with a history of a duodenal ulcer and diverticulitis presents with abdominal pain.  History is from patient and EMS.  Patient had a little bit of pain last week that went away.  He then developed more severe pain in his upper abdomen and towards his right side yesterday.  This started while he was eating.  It feels like a burning type pain.  He is also having pain on his left side.  He has not had any vomiting or black/bloody stool.  He had a normal bowel movement this morning.  He has been out of his home meds, he is not sure of the name (chart lists Protonix , Carafate , Lipitor  and Tylenol  as his home medicines).  He denies chest pain, shortness of breath.  No current back pain.  He has a history of a AAA repair back in 2010 that has been stable.  EMS noted him to be hypertensive but he had equal blood pressures in both upper extremities.  He was given 100 mcg of fentanyl  with some moderate relief.  Prior to Admission medications  Medication Sig Start Date End Date Taking? Authorizing Provider  acetaminophen  (TYLENOL ) 500 MG tablet Take 1 tablet (500 mg total) by mouth every 6 (six) hours as needed for headache (pain). 02/04/17   Henry Manuelita NOVAK, NP  atorvastatin  (LIPITOR ) 80 MG tablet Take 0.5 tablets (40 mg total) by mouth daily. 07/31/24   Will Almarie MATSU, MD  pantoprazole  (PROTONIX ) 40 MG tablet Take 1 tablet (40 mg total) by mouth 2 (two) times daily. 07/31/24   Will Almarie MATSU, MD  sucralfate  (CARAFATE ) 1 g tablet Take 1 tablet (1 g total) by mouth 4 (four) times daily -  with meals and at bedtime. 08/12/24   Alcus Oneil ORN, FNP    Allergies: Patient has no known allergies.    Review of Systems  Constitutional:  Negative for fever.  Respiratory:   Negative for shortness of breath.   Cardiovascular:  Negative for chest pain.  Gastrointestinal:  Positive for abdominal pain. Negative for blood in stool and vomiting.    Updated Vital Signs BP (!) 158/83   Pulse 69   Temp 98.5 F (36.9 C) (Oral)   Resp (!) 21   SpO2 97%   Physical Exam Vitals and nursing note reviewed.  Constitutional:      General: He is not in acute distress.    Appearance: He is well-developed. He is not ill-appearing or diaphoretic.  HENT:     Head: Normocephalic and atraumatic.  Cardiovascular:     Rate and Rhythm: Normal rate and regular rhythm.     Pulses:          Posterior tibial pulses are detected w/ Doppler on the right side and detected w/ Doppler on the left side.     Heart sounds: Normal heart sounds.  Pulmonary:     Effort: Pulmonary effort is normal.     Breath sounds: Normal breath sounds.  Abdominal:     Palpations: Abdomen is soft.     Tenderness: There is abdominal tenderness (most tender in RUQ and epigastrum) in the right upper quadrant, right lower quadrant, epigastric area and left upper quadrant.  Skin:    General: Skin is  warm and dry.  Neurological:     Mental Status: He is alert.     (all labs ordered are listed, but only abnormal results are displayed) Labs Reviewed  COMPREHENSIVE METABOLIC PANEL WITH GFR - Abnormal; Notable for the following components:      Result Value   Glucose, Bld 106 (*)    All other components within normal limits  CBC WITH DIFFERENTIAL/PLATELET - Abnormal; Notable for the following components:   Platelets 463 (*)    All other components within normal limits  URINALYSIS, ROUTINE W REFLEX MICROSCOPIC - Abnormal; Notable for the following components:   Specific Gravity, Urine 1.031 (*)    Hgb urine dipstick SMALL (*)    Ketones, ur 5 (*)    All other components within normal limits  LIPASE, BLOOD  TROPONIN T, HIGH SENSITIVITY  TROPONIN T, HIGH SENSITIVITY    EKG: EKG  Interpretation Date/Time:  Tuesday November 23 2024 11:40:59 EST Ventricular Rate:  61 PR Interval:  179 QRS Duration:  105 QT Interval:  433 QTC Calculation: 437 R Axis:   136  Text Interpretation: Sinus rhythm Atrial premature complex Interpretation limited secondary to artifact otherwise, no significant change since Sept 2025 Confirmed by Freddi Hamilton [phone]) on 11/23/2024 12:01:42 PM  Radiology: CT ABDOMEN PELVIS W CONTRAST Result Date: 11/23/2024 EXAM: CT ABDOMEN AND PELVIS WITH CONTRAST 11/23/2024 12:05:15 PM TECHNIQUE: CT of the abdomen and pelvis was performed with the administration of 100 mL of iohexol  (OMNIPAQUE ) 300 MG/ML solution. Multiplanar reformatted images are provided for review. Automated exposure control, iterative reconstruction, and/or weight-based adjustment of the mA/kV was utilized to reduce the radiation dose to as low as reasonably achievable. COMPARISON: 07/28/2024 CLINICAL HISTORY: Upper abdominal pain, history of duodenal ulcer. FINDINGS: LOWER CHEST: No acute abnormality. LIVER: Hepatic steatosis. GALLBLADDER AND BILE DUCTS: Gallbladder is unremarkable. No biliary ductal dilatation. SPLEEN: Heterogeneous enhancement of the spleen, which may be due to the phase of contrast. PANCREAS: No acute abnormality. ADRENAL GLANDS: No acute abnormality. KIDNEYS, URETERS AND BLADDER: Unchanged left renal cyst. No stones in the kidneys or ureters. No hydronephrosis. No perinephric or periureteral stranding. The urinary bladder is distended without focal abnormality. Findings consistent with Benign Prostatic Hyperplasia (BPH). GI AND BOWEL: Asymmetric wall thickening of the 1st and 2nd portions of the duodenum with subtle periduodenal inflammatory stranding. Normal decompressed appendix. Scattered colonic diverticulosis. There is no bowel obstruction. PERITONEUM AND RETROPERITONEUM: No ascites. No free air. No new retroperitoneal fluid or periaortic inflammation. Subtle inflammatory  stranding may also be present in the region of the left common iliac artery aneurysm. 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 The aneurysm sac measures 3.6 cm in maximal dimension. 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 LYMPH NODES: No lymphadenopathy. REPRODUCTIVE ORGANS: No acute abnormality. BONES AND SOFT TISSUES: Partially visualized left femoral intramedullary nail. Multilevel degenerative disc disease of the spine. Osteopenia. Mild, grade 1 anterolisthesis of L3 on L4, unchanged. No acute osseous abnormality. No focal soft tissue abnormality. IMPRESSION: 1. Asymmetric wall thickening of the 1st and 2nd portions of the duodenum with subtle periduodenal inflammatory stranding, likely due to an inflamed duodenal ulcer. 2. 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 This may represent changes of impending rupture. No retroperitoneal hematoma. Urgent vascular surgery consultation recommended. Electronically signed by: Rogelia Myers MD 11/23/2024 01:56 PM EST RP Workstation: [id number]   DG Chest Portable 1 View Result Date: 11/23/2024 EXAM: 1 VIEW(S) XRAY OF THE CHEST 11/23/2024 11:13:50 AM COMPARISON: 07/28/2024 CLINICAL HISTORY: Upper abdominal pain. FINDINGS: LUNGS AND PLEURA: Mild elevation of right hemidiaphragm, unchanged. No focal pulmonary opacity. No pleural effusion. No pneumothorax. HEART AND MEDIASTINUM: Atherosclerotic calcifications at aortic arch. No acute abnormality of the cardiac and mediastinal silhouettes. BONES AND SOFT TISSUES: No acute osseous abnormality. IMPRESSION: 1. No acute findings. Electronically signed by: Waddell Calk MD 11/23/2024  11:35 AM EST RP Workstation: [id number]     Procedures   Medications Ordered in the ED  pantoprazole  (PROTONIX ) injection 40 mg (40 mg Intravenous Given 11/23/24 1142)  sodium chloride  0.9 % bolus 1,000 mL (0 mLs Intravenous Stopped 11/23/24 1244)  famotidine  (PEPCID ) IVPB 20 mg premix (0 mg Intravenous Stopped 11/23/24 1214)  HYDROmorphone  (DILAUDID ) injection 0.5 mg (0.5 mg Intravenous Given 11/23/24 1137)  iohexol  (OMNIPAQUE ) 300 MG/ML solution 100 mL (100 mLs Intravenous Contrast Given 11/23/24 1153)  HYDROmorphone  (DILAUDID ) injection 0.5 mg (0.5 mg Intravenous Given 11/23/24 1316)  HYDROmorphone  (DILAUDID ) injection 1 mg (1 mg Intravenous Given 11/23/24 1421)                                    Medical Decision Making Amount and/or Complexity of Data Reviewed Labs: ordered.    Details: No anemia, normal troponins Radiology: ordered and independent interpretation performed.    Details: No free air ECG/medicine tests: ordered and independent interpretation performed.    Details: No ischemia  Risk Prescription drug management. Decision regarding hospitalization.   Patient's pain is most likely from his recurrent duodenal ulcer.  This is at least in part due to his chronic smoking but he has also been off of his PPI for several weeks.  He denies any NSAID use.  No signs of bleeding.  However his pain is poorly controlled which has also resulted in labile blood pressures.  CT also shows left iliac aneurysm which is larger than what it has been.  I discussed with Dr. Serene through his OR nurse, this is not something emergent and can be followed up in the outpatient clinic.  He has no groin/lower abdominal symptoms.  However I think you will need admission for PPI and ulcer management given his poor pain control and labile pressures.  Discussed with Dr. Sidgel for admission.     Final diagnoses:  Duodenal ulcer  Iliac artery aneurysm    ED Discharge Orders     None           Freddi Hamilton, MD 11/23/24 1503  "

## 2024-11-23 NOTE — ED Notes (Signed)
 Patient transported to CT

## 2024-11-23 NOTE — ED Notes (Addendum)
 Provider notified of SBP>200's

## 2024-11-24 DIAGNOSIS — R933 Abnormal findings on diagnostic imaging of other parts of digestive tract: Secondary | ICD-10-CM

## 2024-11-24 DIAGNOSIS — F17200 Nicotine dependence, unspecified, uncomplicated: Secondary | ICD-10-CM | POA: Diagnosis not present

## 2024-11-24 DIAGNOSIS — T471X6D Underdosing of other antacids and anti-gastric-secretion drugs, subsequent encounter: Secondary | ICD-10-CM | POA: Diagnosis not present

## 2024-11-24 DIAGNOSIS — F17210 Nicotine dependence, cigarettes, uncomplicated: Secondary | ICD-10-CM | POA: Diagnosis present

## 2024-11-24 DIAGNOSIS — K5900 Constipation, unspecified: Secondary | ICD-10-CM | POA: Diagnosis present

## 2024-11-24 DIAGNOSIS — I10 Essential (primary) hypertension: Secondary | ICD-10-CM | POA: Diagnosis present

## 2024-11-24 DIAGNOSIS — K2980 Duodenitis without bleeding: Secondary | ICD-10-CM | POA: Diagnosis present

## 2024-11-24 DIAGNOSIS — Z79899 Other long term (current) drug therapy: Secondary | ICD-10-CM | POA: Diagnosis not present

## 2024-11-24 DIAGNOSIS — Z91128 Patient's intentional underdosing of medication regimen for other reason: Secondary | ICD-10-CM | POA: Diagnosis not present

## 2024-11-24 DIAGNOSIS — F32A Depression, unspecified: Secondary | ICD-10-CM | POA: Diagnosis present

## 2024-11-24 DIAGNOSIS — I252 Old myocardial infarction: Secondary | ICD-10-CM | POA: Diagnosis not present

## 2024-11-24 DIAGNOSIS — J449 Chronic obstructive pulmonary disease, unspecified: Secondary | ICD-10-CM | POA: Diagnosis present

## 2024-11-24 DIAGNOSIS — I2510 Atherosclerotic heart disease of native coronary artery without angina pectoris: Secondary | ICD-10-CM | POA: Diagnosis present

## 2024-11-24 DIAGNOSIS — R109 Unspecified abdominal pain: Secondary | ICD-10-CM | POA: Diagnosis not present

## 2024-11-24 DIAGNOSIS — K269 Duodenal ulcer, unspecified as acute or chronic, without hemorrhage or perforation: Secondary | ICD-10-CM

## 2024-11-24 DIAGNOSIS — R6881 Early satiety: Secondary | ICD-10-CM | POA: Diagnosis present

## 2024-11-24 DIAGNOSIS — Z716 Tobacco abuse counseling: Secondary | ICD-10-CM | POA: Diagnosis not present

## 2024-11-24 DIAGNOSIS — I7143 Infrarenal abdominal aortic aneurysm, without rupture: Secondary | ICD-10-CM | POA: Diagnosis not present

## 2024-11-24 DIAGNOSIS — Z955 Presence of coronary angioplasty implant and graft: Secondary | ICD-10-CM | POA: Diagnosis not present

## 2024-11-24 DIAGNOSIS — Z95828 Presence of other vascular implants and grafts: Secondary | ICD-10-CM | POA: Diagnosis not present

## 2024-11-24 DIAGNOSIS — Z604 Social exclusion and rejection: Secondary | ICD-10-CM | POA: Diagnosis present

## 2024-11-24 DIAGNOSIS — K3189 Other diseases of stomach and duodenum: Secondary | ICD-10-CM | POA: Diagnosis present

## 2024-11-24 DIAGNOSIS — Z818 Family history of other mental and behavioral disorders: Secondary | ICD-10-CM | POA: Diagnosis not present

## 2024-11-24 DIAGNOSIS — K2270 Barrett's esophagus without dysplasia: Secondary | ICD-10-CM | POA: Diagnosis not present

## 2024-11-24 DIAGNOSIS — Z9889 Other specified postprocedural states: Secondary | ICD-10-CM | POA: Diagnosis not present

## 2024-11-24 DIAGNOSIS — K2289 Other specified disease of esophagus: Secondary | ICD-10-CM | POA: Diagnosis present

## 2024-11-24 DIAGNOSIS — Z720 Tobacco use: Secondary | ICD-10-CM | POA: Diagnosis not present

## 2024-11-24 DIAGNOSIS — K2990 Gastroduodenitis, unspecified, without bleeding: Secondary | ICD-10-CM | POA: Diagnosis not present

## 2024-11-24 DIAGNOSIS — K2970 Gastritis, unspecified, without bleeding: Secondary | ICD-10-CM | POA: Diagnosis not present

## 2024-11-24 DIAGNOSIS — I739 Peripheral vascular disease, unspecified: Secondary | ICD-10-CM | POA: Diagnosis not present

## 2024-11-24 DIAGNOSIS — I723 Aneurysm of iliac artery: Secondary | ICD-10-CM | POA: Diagnosis present

## 2024-11-24 DIAGNOSIS — Z8679 Personal history of other diseases of the circulatory system: Secondary | ICD-10-CM | POA: Diagnosis not present

## 2024-11-24 MED ORDER — MELATONIN 3 MG PO TABS
3.0000 mg | ORAL_TABLET | Freq: Every evening | ORAL | Status: DC | PRN
  Administered 2024-11-24 – 2024-11-25 (×2): 3 mg via ORAL
  Filled 2024-11-24 (×2): qty 1

## 2024-11-24 MED ORDER — PANTOPRAZOLE SODIUM 40 MG IV SOLR
40.0000 mg | Freq: Two times a day (BID) | INTRAVENOUS | Status: DC
  Administered 2024-11-24 – 2024-11-26 (×5): 40 mg via INTRAVENOUS
  Filled 2024-11-24 (×5): qty 10

## 2024-11-24 MED ORDER — NICOTINE 7 MG/24HR TD PT24
7.0000 mg | MEDICATED_PATCH | Freq: Once | TRANSDERMAL | Status: AC
  Administered 2024-11-24: 7 mg via TRANSDERMAL
  Filled 2024-11-24: qty 1

## 2024-11-24 MED ORDER — ATORVASTATIN CALCIUM 40 MG PO TABS
80.0000 mg | ORAL_TABLET | Freq: Every day | ORAL | Status: DC
  Administered 2024-11-25 – 2024-11-26 (×2): 80 mg via ORAL
  Filled 2024-11-24 (×2): qty 2

## 2024-11-24 MED ORDER — SODIUM CHLORIDE 0.9 % IV SOLN: INTRAVENOUS | Status: DC

## 2024-11-24 NOTE — H&P (View-Only) (Signed)
 "  Gastroenterology Consult   Referring Provider: No ref. provider found Primary Care Physician:  Alcus Oneil ORN, FNP Primary Gastroenterologist:  Dr. San   Patient ID: Anthony Coffey; [id number]; Mar 14, 1956   Admit date: 11/23/2024  LOS: 0 days   Date of Consultation: 11/24/2024  Reason for Consultation:  abdominal pain   History of Present Illness   Anthony Coffey is a 69 y.o. year old male with history of duodenal ulcer, CAD status post PCI in 2018, COPD, hypertension, iliac artery aneurysm, who presented to the ED with abdominal pain, reportedly not on his PPI as prescribed. Pain worse with food, appetite decreased. GI consulted for further evaluation   ED course:  Blood pressure was intermittently elevated, as high as more than 200 mmHg.   sodium 136, potassium 4.8, BUN 12, creatinine 0.77, AST 22, ALT 10, alkaline phosphatase 100, total bili 0.5 troponin 13 >> 12, WBC 8.4, hemoglobin 16.2, platelets 463.   CT of chest abdomen and pelvis was done which revealed stranding around duodenum.   Consult Reports upper abdominal pain onset about 1 week ago, sometimes sharp in nature. Worse with eating. No nausea or vomiting. Decreased appetite, some early satiety. No rectal bleeding or melena. He denies any NSAID use. Denies new meds, steroids, or antibiotics. No dysphagia or odynophagia. He denies any reflux symptoms. States he is not taking any acid reflux medication, unsure when he last took pantoprazole  but maybe a month ago. Reports unsure why he had ulcers in the past. He smokes 3/4 pack per day. No etoh.     Last EGD 01/2024 :Normal esophagus.                           - Hematin (altered blood/coffee-ground-like                            material) in the gastric antrum.                           - Acquired duodenal stenoses.                           - Non-bleeding duodenal ulcer with adherent clot.                            Hemostatic spray applied.                            - Several biopsies were obtained                   STOMACH, RANDOM, BIOPSY:  Gastric antral / oxyntic mucosa with focal mild chronic inactive gastritis.  No H. pylori identified on HE stain.  Negative for intestinal metaplasia or dysplasia.           Last Colonoscopy: never?   Past Medical History:  Diagnosis Date   Aneurysm    Anxiety    CAD (coronary artery disease)    02/01/17 Cath DES--> OM, staged PCI DES -->mLAD, EF 55%   COPD (chronic obstructive pulmonary disease) (HCC)    smoker   Hypertension    high with anxiety   Myocardial infarction [facility])     Past Surgical History:  Procedure Laterality Date   ABDOMINAL  AORTIC ANEURYSM REPAIR     CORONARY STENT INTERVENTION N/A 02/01/2017   Procedure: Coronary Stent Intervention;  Surgeon: Victory LELON Sharps, MD;  Location: [facility] INVASIVE CV LAB;  Service: Cardiovascular;  Laterality: N/A;   CORONARY STENT INTERVENTION N/A 02/03/2017   Procedure: Coronary Stent Intervention;  Surgeon: Ozell Fell, MD;  Location: [facility] INVASIVE CV LAB;  Service: Cardiovascular;  Laterality: N/A;   EAR CYST EXCISION Right 06/14/2013   Procedure: CYST REMOVAL RIGHT MANDIBLE;  Surgeon: Glendia CHRISTELLA Primrose, DDS;  Location: [facility] OR;  Service: Oral Surgery;  Laterality: Right;   ESOPHAGOGASTRODUODENOSCOPY N/A 02/02/2024   Procedure: EGD (ESOPHAGOGASTRODUODENOSCOPY);  Surgeon: Legrand Victory LITTIE DOUGLAS, MD;  Location: [facility] ENDOSCOPY;  Service: Gastroenterology;  Laterality: N/A;   INTRAMEDULLARY (IM) NAIL INTERTROCHANTERIC Left 01/22/2020   Procedure: INTRAMEDULLARY (IM) NAIL INTERTROCHANTRIC;  Surgeon: Sharl Selinda Dover, MD;  Location: [facility] OR;  Service: Orthopedics;  Laterality: Left;   IR ANGIOGRAM SELECTIVE EACH ADDITIONAL VESSEL  02/05/2024   IR ANGIOGRAM VISCERAL SELECTIVE  02/05/2024   IR EMBO ART  VEN HEMORR LYMPH EXTRAV  INC GUIDE ROADMAPPING  02/05/2024   IR US  GUIDE VASC ACCESS RIGHT  02/05/2024   LEFT HEART CATH AND CORONARY ANGIOGRAPHY N/A 02/01/2017   Procedure: Left Heart  Cath and Coronary Angiography;  Surgeon: Victory LELON Sharps, MD;  Location: [facility] INVASIVE CV LAB;  Service: Cardiovascular;  Laterality: N/A;   TONSILLECTOMY     TOOTH EXTRACTION N/A 06/14/2013   Procedure: EXTRACTIONS 17, 23, 26, 27, 29, 32;  Surgeon: Glendia CHRISTELLA Primrose, DDS;  Location: [facility] OR;  Service: Oral Surgery;  Laterality: N/A;   VASCULAR SURGERY      Prior to Admission medications  Medication Sig Start Date End Date Taking? Authorizing Provider  acetaminophen  (TYLENOL ) 500 MG tablet Take 1 tablet (500 mg total) by mouth every 6 (six) hours as needed for headache (pain). 02/04/17   Henry Manuelita NOVAK, NP  atorvastatin  (LIPITOR ) 80 MG tablet Take 0.5 tablets (40 mg total) by mouth daily. 07/31/24   Will Almarie MATSU, MD  pantoprazole  (PROTONIX ) 40 MG tablet Take 1 tablet (40 mg total) by mouth 2 (two) times daily. 07/31/24   Will Almarie MATSU, MD  sucralfate  (CARAFATE ) 1 g tablet Take 1 tablet (1 g total) by mouth 4 (four) times daily -  with meals and at bedtime. 08/12/24   Alcus Oneil LELON, FNP    Current Facility-Administered Medications  Medication Dose Route Frequency Provider Last Rate Last Admin   acetaminophen  (TYLENOL ) tablet 650 mg  650 mg Oral Q6H PRN Sigdel, Santosh, MD       Or   acetaminophen  (TYLENOL ) suppository 650 mg  650 mg Rectal Q6H PRN Sigdel, Santosh, MD       atorvastatin  (LIPITOR ) tablet 40 mg  40 mg Oral Daily Sigdel, Santosh, MD   40 mg at 11/24/24 9157   feeding supplement (BOOST / RESOURCE BREEZE) liquid 1 Container  1 Container Oral TID BM Sigdel, Santosh, MD   1 Container at 11/23/24 2103   hydrALAZINE  (APRESOLINE ) injection 10 mg  10 mg Intravenous Q6H PRN Sigdel, Santosh, MD       HYDROcodone -acetaminophen  (NORCO/VICODIN) 5-325 MG per tablet 1-2 tablet  1-2 tablet Oral Q4H PRN Sigdel, Santosh, MD       HYDROmorphone  (DILAUDID ) injection 0.5-1 mg  0.5-1 mg Intravenous Q2H PRN Sigdel, Santosh, MD   1 mg at 11/24/24 0843   lactated ringers  infusion   Intravenous  Continuous Sigdel, Santosh, MD 100 mL/hr at 11/24/24 0240 100  mL/hr at 11/24/24 0240   nicotine  (NICODERM CQ  - dosed in mg/24 hr) patch 7 mg  7 mg Transdermal Once Daniels, James K, NP   7 mg at 11/24/24 9357   ondansetron  (ZOFRAN ) tablet 4 mg  4 mg Oral Q6H PRN Mcarthur Pick, MD       Or   ondansetron  (ZOFRAN ) injection 4 mg  4 mg Intravenous Q6H PRN Sigdel, Santosh, MD       polyethylene glycol (MIRALAX  / GLYCOLAX ) packet 17 g  17 g Oral Daily PRN Sigdel, Santosh, MD       sucralfate  (CARAFATE ) tablet 1 g  1 g Oral TID WC & HS Sigdel, Santosh, MD   1 g at 11/24/24 0842    Allergies as of 11/23/2024   (No Known Allergies)    Family History  Problem Relation Age of Onset   Dementia Mother    Cancer - Lung Father    Anxiety disorder Brother     Social History   Socioeconomic History   Marital status: Single    Spouse name: Not on file   Number of children: Not on file   Years of education: Not on file   Highest education level: Not on file  Occupational History   Not on file  Tobacco Use   Smoking status: Every Day    Current packs/day: 1.00    Average packs/day: 1 pack/day for 78.3 years (77.3 ttl pk-yrs)    Types: Cigarettes    Start date: 08/12/1985   Smokeless tobacco: Former  Building Services Engineer status: Never Used  Substance and Sexual Activity   Alcohol use: No   Drug use: No   Sexual activity: Never  Other Topics Concern   Not on file  Social History Narrative   ** Merged History Encounter **       Social Drivers of Health   Tobacco Use: High Risk (08/12/2024)   Patient History    Smoking Tobacco Use: Every Day    Smokeless Tobacco Use: Former    Passive Exposure: Not on Actuary Strain: Not on file  Food Insecurity: No Food Insecurity (11/23/2024)   Epic    Worried About Programme Researcher, Broadcasting/film/video in the Last Year: Never true    Ran Out of Food in the Last Year: Never true  Transportation Needs: No Transportation Needs (11/23/2024)   Epic     Lack of Transportation (Medical): No    Lack of Transportation (Non-Medical): No  Physical Activity: Not on file  Stress: Not on file  Social Connections: Socially Isolated (11/23/2024)   Social Connection and Isolation Panel    Frequency of Communication with Friends and Family: More than three times a week    Frequency of Social Gatherings with Friends and Family: More than three times a week    Attends Religious Services: Never    Database Administrator or Organizations: No    Attends Banker Meetings: Never    Marital Status: Divorced  Catering Manager Violence: Not At Risk (11/23/2024)   Epic    Fear of Current or Ex-Partner: No    Emotionally Abused: No    Physically Abused: No    Sexually Abused: No  Depression (PHQ2-9): Medium Risk (08/12/2024)   Depression (PHQ2-9)    PHQ-2 Score: 7  Alcohol Screen: Not on file  Housing: Low Risk (11/23/2024)   Epic    Unable to Pay for Housing in the Last Year: No  Number of Times Moved in the Last Year: 0    Homeless in the Last Year: No  Utilities: Not At Risk (11/23/2024)   Epic    Threatened with loss of utilities: No  Health Literacy: Not on file     Review of Systems   Gen: Denies any fever, chills, loss of appetite, change in weight or weight loss CV: Denies chest pain, heart palpitations, syncope, edema  Resp: Denies shortness of breath with rest, cough, wheezing, coughing up blood, and pleurisy. GI: denies melena, hematochezia, nausea, vomiting, diarrhea, constipation, dysphagia, odynophagia +upper abdominal pain +decreased appetite +early satiety +weight loss  GU : Denies urinary burning, blood in urine, urinary frequency, and urinary incontinence. MS: Denies joint pain, limitation of movement, swelling, cramps, and atrophy.  Derm: Denies rash, itching, dry skin, hives. Psych: Denies depression, anxiety, memory loss, hallucinations, and confusion. Heme: Denies bruising or bleeding Neuro:  Denies any  headaches, dizziness, paresthesias, shaking  Physical Exam   Vital Signs in last 24 hours: Temp:  [98 F (36.7 C)-98.5 F (36.9 C)] 98 F (36.7 C) (01/28 0257) Pulse Rate:  [54-173] 94 (01/28 0640) Resp:  [10-25] 18 (01/28 0640) BP: (123-177)/(58-100) 130/64 (01/28 0640) SpO2:  [92 %-100 %] 95 % (01/28 0640) Weight:  [72.3 kg] 72.3 kg (01/27 2200) Last BM Date : 11/23/24  General:   Alert,  Well-developed, well-nourished, pleasant and cooperative in NAD Head:  Normocephalic and atraumatic. Eyes:  Sclera clear, no icterus.   Conjunctiva pink. Ears:  Normal auditory acuity. Mouth:  No deformity or lesions, dentition normal. Neck:  Supple; no masses Lungs:  Clear throughout to auscultation.   No wheezes, crackles, or rhonchi. No acute distress. Heart:  Regular rate and rhythm; no murmurs, clicks, rubs,  or gallops. Abdomen:  Soft, nontender and nondistended. No masses, hepatosplenomegaly or hernias noted. Normal bowel sounds, without guarding, and without rebound.   Msk:  Symmetrical without gross deformities. Normal posture. Extremities:  Without clubbing or edema. Neurologic:  Alert and  oriented x4. Skin:  Intact without significant lesions or rashes. Psych:  Alert and cooperative. Normal mood and affect.  Intake/Output from previous day: 01/27 0701 - 01/28 0700 In: 1307.9 [I.V.:1307.9] Out: 400 [Urine:400] Intake/Output this shift: No intake/output data recorded.   Labs/Studies   Recent Labs Recent Labs    11/23/24 1118 11/23/24 1530  WBC 8.4 8.4  HGB 16.2 13.2  HCT 48.8 41.3  PLT 463* 374   BMET Recent Labs    11/23/24 1118  NA 136  K 4.8  CL 100  CO2 22  GLUCOSE 106*  BUN 12  CREATININE 0.77  CALCIUM  9.4   LFT Recent Labs    11/23/24 1118  PROT 7.5  ALBUMIN 4.5  AST 22  ALT 10  ALKPHOS 100  BILITOT 0.5   Radiology/Studies CT ABDOMEN PELVIS W CONTRAST Result Date: 11/23/2024 EXAM: CT ABDOMEN AND PELVIS WITH CONTRAST 11/23/2024 12:05:15 PM  TECHNIQUE: CT of the abdomen and pelvis was performed with the administration of 100 mL of iohexol  (OMNIPAQUE ) 300 MG/ML solution. Multiplanar reformatted images are provided for review. Automated exposure control, iterative reconstruction, and/or weight-based adjustment of the mA/kV was utilized to reduce the radiation dose to as low as reasonably achievable. COMPARISON: 07/28/2024 CLINICAL HISTORY: Upper abdominal pain, history of duodenal ulcer. FINDINGS: LOWER CHEST: No acute abnormality. LIVER: Hepatic steatosis. GALLBLADDER AND BILE DUCTS: Gallbladder is unremarkable. No biliary ductal dilatation. SPLEEN: Heterogeneous enhancement of the spleen, which may be due to the phase of  contrast. PANCREAS: No acute abnormality. ADRENAL GLANDS: No acute abnormality. KIDNEYS, URETERS AND BLADDER: Unchanged left renal cyst. No stones in the kidneys or ureters. No hydronephrosis. No perinephric or periureteral stranding. The urinary bladder is distended without focal abnormality. Findings consistent with Benign Prostatic Hyperplasia (BPH). GI AND BOWEL: Asymmetric wall thickening of the 1st and 2nd portions of the duodenum with subtle periduodenal inflammatory stranding. Normal decompressed appendix. Scattered colonic diverticulosis. There is no bowel obstruction. PERITONEUM AND RETROPERITONEUM: No ascites. No free air. No new retroperitoneal fluid or periaortic inflammation. Subtle inflammatory stranding may also be present in the region of the left common iliac artery aneurysm. 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 The aneurysm sac measures 3.6 cm in maximal dimension. 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 LYMPH NODES: No lymphadenopathy. REPRODUCTIVE ORGANS: No acute abnormality. BONES AND SOFT TISSUES: Partially  visualized left femoral intramedullary nail. Multilevel degenerative disc disease of the spine. Osteopenia. Mild, grade 1 anterolisthesis of L3 on L4, unchanged. No acute osseous abnormality. No focal soft tissue abnormality. IMPRESSION: 1. Asymmetric wall thickening of the 1st and 2nd portions of the duodenum with subtle periduodenal inflammatory stranding, likely due to an inflamed duodenal ulcer. 2. 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 This may represent changes of impending rupture. No retroperitoneal hematoma. Urgent vascular surgery consultation recommended. Electronically signed by: Rogelia Myers MD 11/23/2024 01:56 PM EST RP Workstation: [id number]   DG Chest Portable 1 View Result Date: 11/23/2024 EXAM: 1 VIEW(S) XRAY OF THE CHEST 11/23/2024 11:13:50 AM COMPARISON: 07/28/2024 CLINICAL HISTORY: Upper abdominal pain. FINDINGS: LUNGS AND PLEURA: Mild elevation of right hemidiaphragm, unchanged. No focal pulmonary opacity. No pleural effusion. No pneumothorax. HEART AND MEDIASTINUM: Atherosclerotic calcifications at aortic arch. No acute abnormality of the cardiac and mediastinal silhouettes. BONES AND SOFT TISSUES: No acute osseous abnormality. IMPRESSION: 1. No acute findings. Electronically signed by: Waddell Calk MD 11/23/2024 11:35 AM EST RP Workstation: [id number]     Assessment   GREOGORY CORNETTE is a 69 y.o. year old male with history of duodenal ulcer, CAD status post PCI in 2018, COPD, hypertension, iliac artery aneurysm, who presented to the ED with abdominal pain, reportedly not on his PPI as prescribed. Pain worse with food, appetite decreased. GI consulted for further evaluation   Abdominal pain: Duodenal ulcer on EGD in April 2025 CT A/P with contrast: Asymmetric wall thickening of the 1st and 2nd portions of the duodenum with subtle  periduodenal inflammatory stranding, likely due to an inflamed duodenal ulcer No NSAIDs Not currently on PPI as outpatient  No ETOH but does smoke 0.75 PPD Decreased appetite, early satiety No dysphagia, odynophagia, melena or BRBPR Down from 175lbs 06/2024 to 159 lbs this admission  Suspect symptoms are secondary to recurrent duodenal ulcer. Patient is hungry. For now, will advance to soft diet today, PPI BID and add carafate , discussed  holding off on endoscopic evaluation given CT findings and Recent EGD last year with finding of duodenal ulcer unless symptoms not improving or unable to tolerate POs, however, patient wishes to undergo EGD while admitted. Indications, risks and benefits of procedure discussed in detail with patient. Patient verbalized understanding and is  in agreement to proceed with EGD tomorrow.  If EGD shows findings of recurrent duodenal ulcer, would consider checking AM fasting gastrin levels as it is unclear why he has recurrent PUD, he does smoke which could be contributing though denies any other offending factors such as NSAID use.   Plan / Recommendations   Remain NPO PPI BID Avoid all NSAIDs Soft diet today, NPO midnight Continue Carafate  1g QID EGD tomorrow  Consider AM fasting gastrin level pending EGD findings   Dhruvi Crenshaw L. Mariette, MSN, APRN, AGNP-C Adult-Gerontology Nurse Practitioner [facility] Gastroenterology at [facility]

## 2024-11-24 NOTE — Progress Notes (Signed)
 " Progress Note   Patient: Anthony Coffey FMW:[id number] DOB: 10-May-1956 DOA: 11/23/2024     0 DOS: the patient was seen and examined on 11/24/2024   Brief hospital course: Anthony Coffey is a 69 y.o. male with medical history significant of duodenal ulcer, CAD status post PCI in 2018, COPD, hypertension, iliac artery aneurysm, presents to the ER with complaints of abdominal pain.  Patient has history of duodenal ulcer in the past and is supposed to take Protonix  but he has not been taking from past several weeks.  He continues to smoke cigarettes.  Over the past week or so he has been having intermittent upper abdominal pain with which is worse with food.  He also reports some decreased appetite.  Denies vomiting.   CT chest abdomen pelvis revealed stranding around the duodenum admitted to hospitalist service for further management evaluation of duodenitis, GI consulted.  Assessment and Plan: Upper abdominal pain- Duodenitis-CT finding. Patient will be continued on IV Protonix  40 mg twice daily, Carafate . Patient seems to be noncompliant with Protonix .  No upper GI bleeding or nausea vomiting. GI consult appreciated. Patient to go for EGD tomorrow.  N.p.o. past midnight.  Infrarenal AAA- ED provider discussed with vascular surgery advised outpatient follow-up.  History of CAD status post PCI Continue statin therapy.  Current smoker- Smoking cessation counseled.  Hypertension: Elevated blood pressure due to pain. Continue to monitor blood pressure closely.  IV hydralazine  as needed.      Out of bed to chair. Incentive spirometry. Nursing supportive care. Fall, aspiration precautions. Diet:  Diet Orders (From admission, onward)     Start     Ordered   11/25/24 0001  Diet NPO time specified  Diet effective midnight        11/24/24 1021   11/24/24 1020  DIET SOFT Room service appropriate? Yes; Fluid consistency: Thin  Diet effective now       Question Answer Comment  Room service  appropriate? Yes   Fluid consistency: Thin      11/24/24 1021           DVT prophylaxis: SCDs Start: 11/23/24 1520  Level of care: Telemetry   Code Status: Full Code  Subjective: Patient is seen and examined today morning.  He is lying in the bed, does have epigastric, upper abdominal discomfort.  Wishes to eat.  No nausea or vomiting.  Physical Exam: Vitals:   11/23/24 2304 11/24/24 0257 11/24/24 0640 11/24/24 1224  BP: 125/76 (!) 123/58 130/64 (!) 153/79  Pulse: (!) 55 (!) 57 94 60  Resp: 16 16 18 16   Temp: 98.1 F (36.7 C) 98 F (36.7 C)  99.2 F (37.3 C)  TempSrc: Oral Oral Oral Oral  SpO2: 95% 94% 95% 98%  Weight:      Height:        General -elderly African-American male, distress due to pain HEENT - PERRLA, EOMI, atraumatic head, non tender sinuses. Lung - Clear, no rales, rhonchi, wheezes. Heart - S1, S2 heard, no murmurs, rubs, no pedal edema. Abdomen - Soft, epigastric tenderness, no guarding, bowel sounds good Neuro - Alert, awake and oriented x 3, non focal exam. Skin - Warm and dry.  Data Reviewed:      Latest Ref Rng & Units 11/23/2024    3:30 PM 11/23/2024   11:18 AM 08/12/2024   10:06 AM  CBC  WBC 4.0 - 10.5 K/uL 8.4  8.4  8.5   Hemoglobin 13.0 - 17.0 g/dL 13.2  16.2  13.5   Hematocrit 39.0 - 52.0 % 41.3  48.8  41.1   Platelets 150 - 400 K/uL 374  463  333       Latest Ref Rng & Units 11/23/2024   11:18 AM 08/12/2024   10:06 AM 07/31/2024    6:04 AM  BMP  Glucose 70 - 99 mg/dL 893  856  98   BUN 8 - 23 mg/dL 12  8  <5   Creatinine 0.61 - 1.24 mg/dL 9.22  9.03  9.29   BUN/Creat Ratio 10 - 24  8    Sodium 135 - 145 mmol/L 136  141  140   Potassium 3.5 - 5.1 mmol/L 4.8  3.6  4.1   Chloride 98 - 111 mmol/L 100  101  109   CO2 22 - 32 mmol/L 22  23  19    Calcium  8.9 - 10.3 mg/dL 9.4  9.3  9.4    CT ABDOMEN PELVIS W CONTRAST Result Date: 11/23/2024 EXAM: CT ABDOMEN AND PELVIS WITH CONTRAST 11/23/2024 12:05:15 PM TECHNIQUE: CT of the  abdomen and pelvis was performed with the administration of 100 mL of iohexol  (OMNIPAQUE ) 300 MG/ML solution. Multiplanar reformatted images are provided for review. Automated exposure control, iterative reconstruction, and/or weight-based adjustment of the mA/kV was utilized to reduce the radiation dose to as low as reasonably achievable. COMPARISON: 07/28/2024 CLINICAL HISTORY: Upper abdominal pain, history of duodenal ulcer. FINDINGS: LOWER CHEST: No acute abnormality. LIVER: Hepatic steatosis. GALLBLADDER AND BILE DUCTS: Gallbladder is unremarkable. No biliary ductal dilatation. SPLEEN: Heterogeneous enhancement of the spleen, which may be due to the phase of contrast. PANCREAS: No acute abnormality. ADRENAL GLANDS: No acute abnormality. KIDNEYS, URETERS AND BLADDER: Unchanged left renal cyst. No stones in the kidneys or ureters. No hydronephrosis. No perinephric or periureteral stranding. The urinary bladder is distended without focal abnormality. Findings consistent with Benign Prostatic Hyperplasia (BPH). GI AND BOWEL: Asymmetric wall thickening of the 1st and 2nd portions of the duodenum with subtle periduodenal inflammatory stranding. Normal decompressed appendix. Scattered colonic diverticulosis. There is no bowel obstruction. PERITONEUM AND RETROPERITONEUM: No ascites. No free air. No new retroperitoneal fluid or periaortic inflammation. Subtle inflammatory stranding may also be present in the region of the left common iliac artery aneurysm. 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 The aneurysm sac measures 3.6 cm in maximal dimension. 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 LYMPH NODES: No lymphadenopathy. REPRODUCTIVE ORGANS: No acute abnormality. BONES AND SOFT TISSUES: Partially visualized left femoral  intramedullary nail. Multilevel degenerative disc disease of the spine. Osteopenia. Mild, grade 1 anterolisthesis of L3 on L4, unchanged. No acute osseous abnormality. No focal soft tissue abnormality. IMPRESSION: 1. Asymmetric wall thickening of the 1st and 2nd portions of the duodenum with subtle periduodenal inflammatory stranding, likely due to an inflamed duodenal ulcer. 2. 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 This may represent changes of impending rupture. No retroperitoneal hematoma. Urgent vascular surgery consultation recommended. Electronically signed by: Rogelia Myers MD 11/23/2024 01:56 PM EST RP Workstation: [id number]   DG Chest Portable 1 View Result Date: 11/23/2024 EXAM: 1 VIEW(S) XRAY OF THE CHEST 11/23/2024 11:13:50 AM COMPARISON:  07/28/2024 CLINICAL HISTORY: Upper abdominal pain. FINDINGS: LUNGS AND PLEURA: Mild elevation of right hemidiaphragm, unchanged. No focal pulmonary opacity. No pleural effusion. No pneumothorax. HEART AND MEDIASTINUM: Atherosclerotic calcifications at aortic arch. No acute abnormality of the cardiac and mediastinal silhouettes. BONES AND SOFT TISSUES: No acute osseous abnormality. IMPRESSION: 1. No acute findings. Electronically signed by: Waddell Calk MD 11/23/2024 11:35 AM EST RP Workstation: [id number]    Family Communication: Discussed with patient, understand and agree. All questions answered.  Disposition: Status is: Inpatient Remains inpatient appropriate because: GI workup, EGD  Planned Discharge Destination: Home     Time spent: 42 minutes  Author: Concepcion Riser, MD 11/24/2024 2:08 PM Secure chat 7am to 7pm For on call review [email or web address].    "

## 2024-11-24 NOTE — Consult Note (Addendum)
 "  Gastroenterology Consult   Referring Provider: No ref. provider found Primary Care Physician:  Alcus Oneil ORN, FNP Primary Gastroenterologist:  Dr. San   Patient ID: Anthony Coffey; [id number]; Mar 14, 1956   Admit date: 11/23/2024  LOS: 0 days   Date of Consultation: 11/24/2024  Reason for Consultation:  abdominal pain   History of Present Illness   Anthony Coffey is a 69 y.o. year old male with history of duodenal ulcer, CAD status post PCI in 2018, COPD, hypertension, iliac artery aneurysm, who presented to the ED with abdominal pain, reportedly not on his PPI as prescribed. Pain worse with food, appetite decreased. GI consulted for further evaluation   ED course:  Blood pressure was intermittently elevated, as high as more than 200 mmHg.   sodium 136, potassium 4.8, BUN 12, creatinine 0.77, AST 22, ALT 10, alkaline phosphatase 100, total bili 0.5 troponin 13 >> 12, WBC 8.4, hemoglobin 16.2, platelets 463.   CT of chest abdomen and pelvis was done which revealed stranding around duodenum.   Consult Reports upper abdominal pain onset about 1 week ago, sometimes sharp in nature. Worse with eating. No nausea or vomiting. Decreased appetite, some early satiety. No rectal bleeding or melena. He denies any NSAID use. Denies new meds, steroids, or antibiotics. No dysphagia or odynophagia. He denies any reflux symptoms. States he is not taking any acid reflux medication, unsure when he last took pantoprazole  but maybe a month ago. Reports unsure why he had ulcers in the past. He smokes 3/4 pack per day. No etoh.     Last EGD 01/2024 :Normal esophagus.                           - Hematin (altered blood/coffee-ground-like                            material) in the gastric antrum.                           - Acquired duodenal stenoses.                           - Non-bleeding duodenal ulcer with adherent clot.                            Hemostatic spray applied.                            - Several biopsies were obtained                   STOMACH, RANDOM, BIOPSY:  Gastric antral / oxyntic mucosa with focal mild chronic inactive gastritis.  No H. pylori identified on HE stain.  Negative for intestinal metaplasia or dysplasia.           Last Colonoscopy: never?   Past Medical History:  Diagnosis Date   Aneurysm    Anxiety    CAD (coronary artery disease)    02/01/17 Cath DES--> OM, staged PCI DES -->mLAD, EF 55%   COPD (chronic obstructive pulmonary disease) (HCC)    smoker   Hypertension    high with anxiety   Myocardial infarction [facility])     Past Surgical History:  Procedure Laterality Date   ABDOMINAL  AORTIC ANEURYSM REPAIR     CORONARY STENT INTERVENTION N/A 02/01/2017   Procedure: Coronary Stent Intervention;  Surgeon: Victory LELON Sharps, MD;  Location: [facility] INVASIVE CV LAB;  Service: Cardiovascular;  Laterality: N/A;   CORONARY STENT INTERVENTION N/A 02/03/2017   Procedure: Coronary Stent Intervention;  Surgeon: Ozell Fell, MD;  Location: [facility] INVASIVE CV LAB;  Service: Cardiovascular;  Laterality: N/A;   EAR CYST EXCISION Right 06/14/2013   Procedure: CYST REMOVAL RIGHT MANDIBLE;  Surgeon: Glendia CHRISTELLA Primrose, DDS;  Location: [facility] OR;  Service: Oral Surgery;  Laterality: Right;   ESOPHAGOGASTRODUODENOSCOPY N/A 02/02/2024   Procedure: EGD (ESOPHAGOGASTRODUODENOSCOPY);  Surgeon: Legrand Victory LITTIE DOUGLAS, MD;  Location: [facility] ENDOSCOPY;  Service: Gastroenterology;  Laterality: N/A;   INTRAMEDULLARY (IM) NAIL INTERTROCHANTERIC Left 01/22/2020   Procedure: INTRAMEDULLARY (IM) NAIL INTERTROCHANTRIC;  Surgeon: Sharl Selinda Dover, MD;  Location: [facility] OR;  Service: Orthopedics;  Laterality: Left;   IR ANGIOGRAM SELECTIVE EACH ADDITIONAL VESSEL  02/05/2024   IR ANGIOGRAM VISCERAL SELECTIVE  02/05/2024   IR EMBO ART  VEN HEMORR LYMPH EXTRAV  INC GUIDE ROADMAPPING  02/05/2024   IR US  GUIDE VASC ACCESS RIGHT  02/05/2024   LEFT HEART CATH AND CORONARY ANGIOGRAPHY N/A 02/01/2017   Procedure: Left Heart  Cath and Coronary Angiography;  Surgeon: Victory LELON Sharps, MD;  Location: [facility] INVASIVE CV LAB;  Service: Cardiovascular;  Laterality: N/A;   TONSILLECTOMY     TOOTH EXTRACTION N/A 06/14/2013   Procedure: EXTRACTIONS 17, 23, 26, 27, 29, 32;  Surgeon: Glendia CHRISTELLA Primrose, DDS;  Location: [facility] OR;  Service: Oral Surgery;  Laterality: N/A;   VASCULAR SURGERY      Prior to Admission medications  Medication Sig Start Date End Date Taking? Authorizing Provider  acetaminophen  (TYLENOL ) 500 MG tablet Take 1 tablet (500 mg total) by mouth every 6 (six) hours as needed for headache (pain). 02/04/17   Henry Manuelita NOVAK, NP  atorvastatin  (LIPITOR ) 80 MG tablet Take 0.5 tablets (40 mg total) by mouth daily. 07/31/24   Will Almarie MATSU, MD  pantoprazole  (PROTONIX ) 40 MG tablet Take 1 tablet (40 mg total) by mouth 2 (two) times daily. 07/31/24   Will Almarie MATSU, MD  sucralfate  (CARAFATE ) 1 g tablet Take 1 tablet (1 g total) by mouth 4 (four) times daily -  with meals and at bedtime. 08/12/24   Alcus Oneil LELON, FNP    Current Facility-Administered Medications  Medication Dose Route Frequency Provider Last Rate Last Admin   acetaminophen  (TYLENOL ) tablet 650 mg  650 mg Oral Q6H PRN Sigdel, Santosh, MD       Or   acetaminophen  (TYLENOL ) suppository 650 mg  650 mg Rectal Q6H PRN Sigdel, Santosh, MD       atorvastatin  (LIPITOR ) tablet 40 mg  40 mg Oral Daily Sigdel, Santosh, MD   40 mg at 11/24/24 9157   feeding supplement (BOOST / RESOURCE BREEZE) liquid 1 Container  1 Container Oral TID BM Sigdel, Santosh, MD   1 Container at 11/23/24 2103   hydrALAZINE  (APRESOLINE ) injection 10 mg  10 mg Intravenous Q6H PRN Sigdel, Santosh, MD       HYDROcodone -acetaminophen  (NORCO/VICODIN) 5-325 MG per tablet 1-2 tablet  1-2 tablet Oral Q4H PRN Sigdel, Santosh, MD       HYDROmorphone  (DILAUDID ) injection 0.5-1 mg  0.5-1 mg Intravenous Q2H PRN Sigdel, Santosh, MD   1 mg at 11/24/24 0843   lactated ringers  infusion   Intravenous  Continuous Sigdel, Santosh, MD 100 mL/hr at 11/24/24 0240 100  mL/hr at 11/24/24 0240   nicotine  (NICODERM CQ  - dosed in mg/24 hr) patch 7 mg  7 mg Transdermal Once Daniels, James K, NP   7 mg at 11/24/24 9357   ondansetron  (ZOFRAN ) tablet 4 mg  4 mg Oral Q6H PRN Mcarthur Pick, MD       Or   ondansetron  (ZOFRAN ) injection 4 mg  4 mg Intravenous Q6H PRN Sigdel, Santosh, MD       polyethylene glycol (MIRALAX  / GLYCOLAX ) packet 17 g  17 g Oral Daily PRN Sigdel, Santosh, MD       sucralfate  (CARAFATE ) tablet 1 g  1 g Oral TID WC & HS Sigdel, Santosh, MD   1 g at 11/24/24 0842    Allergies as of 11/23/2024   (No Known Allergies)    Family History  Problem Relation Age of Onset   Dementia Mother    Cancer - Lung Father    Anxiety disorder Brother     Social History   Socioeconomic History   Marital status: Single    Spouse name: Not on file   Number of children: Not on file   Years of education: Not on file   Highest education level: Not on file  Occupational History   Not on file  Tobacco Use   Smoking status: Every Day    Current packs/day: 1.00    Average packs/day: 1 pack/day for 78.3 years (77.3 ttl pk-yrs)    Types: Cigarettes    Start date: 08/12/1985   Smokeless tobacco: Former  Building Services Engineer status: Never Used  Substance and Sexual Activity   Alcohol use: No   Drug use: No   Sexual activity: Never  Other Topics Concern   Not on file  Social History Narrative   ** Merged History Encounter **       Social Drivers of Health   Tobacco Use: High Risk (08/12/2024)   Patient History    Smoking Tobacco Use: Every Day    Smokeless Tobacco Use: Former    Passive Exposure: Not on Actuary Strain: Not on file  Food Insecurity: No Food Insecurity (11/23/2024)   Epic    Worried About Programme Researcher, Broadcasting/film/video in the Last Year: Never true    Ran Out of Food in the Last Year: Never true  Transportation Needs: No Transportation Needs (11/23/2024)   Epic     Lack of Transportation (Medical): No    Lack of Transportation (Non-Medical): No  Physical Activity: Not on file  Stress: Not on file  Social Connections: Socially Isolated (11/23/2024)   Social Connection and Isolation Panel    Frequency of Communication with Friends and Family: More than three times a week    Frequency of Social Gatherings with Friends and Family: More than three times a week    Attends Religious Services: Never    Database Administrator or Organizations: No    Attends Banker Meetings: Never    Marital Status: Divorced  Catering Manager Violence: Not At Risk (11/23/2024)   Epic    Fear of Current or Ex-Partner: No    Emotionally Abused: No    Physically Abused: No    Sexually Abused: No  Depression (PHQ2-9): Medium Risk (08/12/2024)   Depression (PHQ2-9)    PHQ-2 Score: 7  Alcohol Screen: Not on file  Housing: Low Risk (11/23/2024)   Epic    Unable to Pay for Housing in the Last Year: No  Number of Times Moved in the Last Year: 0    Homeless in the Last Year: No  Utilities: Not At Risk (11/23/2024)   Epic    Threatened with loss of utilities: No  Health Literacy: Not on file     Review of Systems   Gen: Denies any fever, chills, loss of appetite, change in weight or weight loss CV: Denies chest pain, heart palpitations, syncope, edema  Resp: Denies shortness of breath with rest, cough, wheezing, coughing up blood, and pleurisy. GI: denies melena, hematochezia, nausea, vomiting, diarrhea, constipation, dysphagia, odynophagia +upper abdominal pain +decreased appetite +early satiety +weight loss  GU : Denies urinary burning, blood in urine, urinary frequency, and urinary incontinence. MS: Denies joint pain, limitation of movement, swelling, cramps, and atrophy.  Derm: Denies rash, itching, dry skin, hives. Psych: Denies depression, anxiety, memory loss, hallucinations, and confusion. Heme: Denies bruising or bleeding Neuro:  Denies any  headaches, dizziness, paresthesias, shaking  Physical Exam   Vital Signs in last 24 hours: Temp:  [98 F (36.7 C)-98.5 F (36.9 C)] 98 F (36.7 C) (01/28 0257) Pulse Rate:  [54-173] 94 (01/28 0640) Resp:  [10-25] 18 (01/28 0640) BP: (123-177)/(58-100) 130/64 (01/28 0640) SpO2:  [92 %-100 %] 95 % (01/28 0640) Weight:  [72.3 kg] 72.3 kg (01/27 2200) Last BM Date : 11/23/24  General:   Alert,  Well-developed, well-nourished, pleasant and cooperative in NAD Head:  Normocephalic and atraumatic. Eyes:  Sclera clear, no icterus.   Conjunctiva pink. Ears:  Normal auditory acuity. Mouth:  No deformity or lesions, dentition normal. Neck:  Supple; no masses Lungs:  Clear throughout to auscultation.   No wheezes, crackles, or rhonchi. No acute distress. Heart:  Regular rate and rhythm; no murmurs, clicks, rubs,  or gallops. Abdomen:  Soft, nontender and nondistended. No masses, hepatosplenomegaly or hernias noted. Normal bowel sounds, without guarding, and without rebound.   Msk:  Symmetrical without gross deformities. Normal posture. Extremities:  Without clubbing or edema. Neurologic:  Alert and  oriented x4. Skin:  Intact without significant lesions or rashes. Psych:  Alert and cooperative. Normal mood and affect.  Intake/Output from previous day: 01/27 0701 - 01/28 0700 In: 1307.9 [I.V.:1307.9] Out: 400 [Urine:400] Intake/Output this shift: No intake/output data recorded.   Labs/Studies   Recent Labs Recent Labs    11/23/24 1118 11/23/24 1530  WBC 8.4 8.4  HGB 16.2 13.2  HCT 48.8 41.3  PLT 463* 374   BMET Recent Labs    11/23/24 1118  NA 136  K 4.8  CL 100  CO2 22  GLUCOSE 106*  BUN 12  CREATININE 0.77  CALCIUM  9.4   LFT Recent Labs    11/23/24 1118  PROT 7.5  ALBUMIN 4.5  AST 22  ALT 10  ALKPHOS 100  BILITOT 0.5   Radiology/Studies CT ABDOMEN PELVIS W CONTRAST Result Date: 11/23/2024 EXAM: CT ABDOMEN AND PELVIS WITH CONTRAST 11/23/2024 12:05:15 PM  TECHNIQUE: CT of the abdomen and pelvis was performed with the administration of 100 mL of iohexol  (OMNIPAQUE ) 300 MG/ML solution. Multiplanar reformatted images are provided for review. Automated exposure control, iterative reconstruction, and/or weight-based adjustment of the mA/kV was utilized to reduce the radiation dose to as low as reasonably achievable. COMPARISON: 07/28/2024 CLINICAL HISTORY: Upper abdominal pain, history of duodenal ulcer. FINDINGS: LOWER CHEST: No acute abnormality. LIVER: Hepatic steatosis. GALLBLADDER AND BILE DUCTS: Gallbladder is unremarkable. No biliary ductal dilatation. SPLEEN: Heterogeneous enhancement of the spleen, which may be due to the phase of  contrast. PANCREAS: No acute abnormality. ADRENAL GLANDS: No acute abnormality. KIDNEYS, URETERS AND BLADDER: Unchanged left renal cyst. No stones in the kidneys or ureters. No hydronephrosis. No perinephric or periureteral stranding. The urinary bladder is distended without focal abnormality. Findings consistent with Benign Prostatic Hyperplasia (BPH). GI AND BOWEL: Asymmetric wall thickening of the 1st and 2nd portions of the duodenum with subtle periduodenal inflammatory stranding. Normal decompressed appendix. Scattered colonic diverticulosis. There is no bowel obstruction. PERITONEUM AND RETROPERITONEUM: No ascites. No free air. No new retroperitoneal fluid or periaortic inflammation. Subtle inflammatory stranding may also be present in the region of the left common iliac artery aneurysm. 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 The aneurysm sac measures 3.6 cm in maximal dimension. 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 LYMPH NODES: No lymphadenopathy. REPRODUCTIVE ORGANS: No acute abnormality. BONES AND SOFT TISSUES: Partially  visualized left femoral intramedullary nail. Multilevel degenerative disc disease of the spine. Osteopenia. Mild, grade 1 anterolisthesis of L3 on L4, unchanged. No acute osseous abnormality. No focal soft tissue abnormality. IMPRESSION: 1. Asymmetric wall thickening of the 1st and 2nd portions of the duodenum with subtle periduodenal inflammatory stranding, likely due to an inflamed duodenal ulcer. 2. 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 This may represent changes of impending rupture. No retroperitoneal hematoma. Urgent vascular surgery consultation recommended. Electronically signed by: Rogelia Myers MD 11/23/2024 01:56 PM EST RP Workstation: [id number]   DG Chest Portable 1 View Result Date: 11/23/2024 EXAM: 1 VIEW(S) XRAY OF THE CHEST 11/23/2024 11:13:50 AM COMPARISON: 07/28/2024 CLINICAL HISTORY: Upper abdominal pain. FINDINGS: LUNGS AND PLEURA: Mild elevation of right hemidiaphragm, unchanged. No focal pulmonary opacity. No pleural effusion. No pneumothorax. HEART AND MEDIASTINUM: Atherosclerotic calcifications at aortic arch. No acute abnormality of the cardiac and mediastinal silhouettes. BONES AND SOFT TISSUES: No acute osseous abnormality. IMPRESSION: 1. No acute findings. Electronically signed by: Waddell Calk MD 11/23/2024 11:35 AM EST RP Workstation: [id number]     Assessment   GREOGORY CORNETTE is a 69 y.o. year old male with history of duodenal ulcer, CAD status post PCI in 2018, COPD, hypertension, iliac artery aneurysm, who presented to the ED with abdominal pain, reportedly not on his PPI as prescribed. Pain worse with food, appetite decreased. GI consulted for further evaluation   Abdominal pain: Duodenal ulcer on EGD in April 2025 CT A/P with contrast: Asymmetric wall thickening of the 1st and 2nd portions of the duodenum with subtle  periduodenal inflammatory stranding, likely due to an inflamed duodenal ulcer No NSAIDs Not currently on PPI as outpatient  No ETOH but does smoke 0.75 PPD Decreased appetite, early satiety No dysphagia, odynophagia, melena or BRBPR Down from 175lbs 06/2024 to 159 lbs this admission  Suspect symptoms are secondary to recurrent duodenal ulcer. Patient is hungry. For now, will advance to soft diet today, PPI BID and add carafate , discussed  holding off on endoscopic evaluation given CT findings and Recent EGD last year with finding of duodenal ulcer unless symptoms not improving or unable to tolerate POs, however, patient wishes to undergo EGD while admitted. Indications, risks and benefits of procedure discussed in detail with patient. Patient verbalized understanding and is  in agreement to proceed with EGD tomorrow.  If EGD shows findings of recurrent duodenal ulcer, would consider checking AM fasting gastrin levels as it is unclear why he has recurrent PUD, he does smoke which could be contributing though denies any other offending factors such as NSAID use.   Plan / Recommendations   Remain NPO PPI BID Avoid all NSAIDs Soft diet today, NPO midnight Continue Carafate  1g QID EGD tomorrow  Consider AM fasting gastrin level pending EGD findings   Dhruvi Crenshaw L. Mariette, MSN, APRN, AGNP-C Adult-Gerontology Nurse Practitioner [facility] Gastroenterology at [facility]

## 2024-11-24 NOTE — Plan of Care (Signed)
  Problem: Pain Managment: Goal: General experience of comfort will improve and/or be controlled Outcome: Progressing

## 2024-11-24 NOTE — Progress Notes (Signed)
 Patient refuses boost breeze. He stated that he doesn't like them and is refusing them. Consent order for EGD was signed and placed in his chart.

## 2024-11-24 NOTE — Progress Notes (Signed)
" °   11/24/24 0753  TOC Brief Assessment  Insurance and Status Reviewed  Patient has primary care physician Yes  Home environment has been reviewed From Home  Prior level of function: Independent  Prior/Current Home Services No current home services  Social Drivers of Health Review SDOH reviewed interventions complete  Readmission risk has been reviewed Yes  Transition of care needs no transition of care needs at this time   Inpatient Care Management (ICM) has reviewed patient and no other ICM needs have been identified at this time. We will continue to monitor patient advancement through interdisciplinary progression rounds. If new patient transition needs arise, please place a ICM consult. "

## 2024-11-25 ENCOUNTER — Inpatient Hospital Stay (HOSPITAL_COMMUNITY): Admitting: Anesthesiology

## 2024-11-25 ENCOUNTER — Encounter (HOSPITAL_COMMUNITY): Payer: Self-pay | Admitting: Hospitalist

## 2024-11-25 ENCOUNTER — Encounter (HOSPITAL_COMMUNITY)
Admission: EM | Disposition: A | Discharge status: Home / Self Care | Payer: Self-pay | Source: Home / Self Care | Attending: Internal Medicine

## 2024-11-25 DIAGNOSIS — R109 Unspecified abdominal pain: Secondary | ICD-10-CM | POA: Diagnosis not present

## 2024-11-25 DIAGNOSIS — I7143 Infrarenal abdominal aortic aneurysm, without rupture: Secondary | ICD-10-CM | POA: Diagnosis not present

## 2024-11-25 DIAGNOSIS — K2970 Gastritis, unspecified, without bleeding: Secondary | ICD-10-CM

## 2024-11-25 DIAGNOSIS — K269 Duodenal ulcer, unspecified as acute or chronic, without hemorrhage or perforation: Secondary | ICD-10-CM | POA: Diagnosis not present

## 2024-11-25 DIAGNOSIS — I10 Essential (primary) hypertension: Secondary | ICD-10-CM | POA: Diagnosis not present

## 2024-11-25 DIAGNOSIS — K2270 Barrett's esophagus without dysplasia: Secondary | ICD-10-CM | POA: Diagnosis not present

## 2024-11-25 DIAGNOSIS — K3189 Other diseases of stomach and duodenum: Secondary | ICD-10-CM | POA: Diagnosis not present

## 2024-11-25 DIAGNOSIS — F17200 Nicotine dependence, unspecified, uncomplicated: Secondary | ICD-10-CM | POA: Diagnosis not present

## 2024-11-25 DIAGNOSIS — K2980 Duodenitis without bleeding: Secondary | ICD-10-CM | POA: Diagnosis not present

## 2024-11-25 MED ORDER — PROPOFOL 10 MG/ML IV BOLUS
INTRAVENOUS | Status: DC | PRN
  Administered 2024-11-25: 50 mg via INTRAVENOUS
  Administered 2024-11-25: 30 mg via INTRAVENOUS
  Administered 2024-11-25: 130 mg via INTRAVENOUS
  Administered 2024-11-25: 20 mg via INTRAVENOUS

## 2024-11-25 MED ORDER — LACTATED RINGERS IV SOLN: INTRAVENOUS | Status: DC

## 2024-11-25 MED ORDER — STERILE WATER FOR IRRIGATION IR SOLN
Status: DC | PRN
  Administered 2024-11-25: 60 mL

## 2024-11-25 MED ORDER — NICOTINE 14 MG/24HR TD PT24
14.0000 mg | MEDICATED_PATCH | Freq: Every day | TRANSDERMAL | Status: DC
  Administered 2024-11-25 – 2024-11-26 (×2): 14 mg via TRANSDERMAL
  Filled 2024-11-25 (×2): qty 1

## 2024-11-25 MED ORDER — LIDOCAINE HCL 1 % IJ SOLN
INTRAMUSCULAR | Status: DC | PRN
  Administered 2024-11-25: 50 mg via INTRADERMAL

## 2024-11-25 NOTE — Brief Op Note (Signed)
 Patient underwent EGD under propofol  sedation.  Tolerated the procedure adequately.   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 Biopsied. - Gastritis. Biopsied. - Duodenal deformity at the duodenal sweep. - Non- bleeding duodenal ulcer with a clean ulcer base ( Forrest Class III) ; this was located at the duodenal sweep. Biopsied. - Normal second portion of the duodenum.   RECOMMENDATIONS  Follow up path  Advised complete alcohol/ smoking cessation  Gastrin levels  upon discharge prescribe omeprazole 40mg  OD  If continues pain and symptoms obtain CT angio abdomen to assess vasculature  Advance diet as tolerated  Avoid NSAIDs  Anthony Blizzard Faizan Imaya Duffy, MD Gastroenterology and Hepatology [facility] Gastroenterology

## 2024-11-25 NOTE — Op Note (Addendum)
 [facility] Patient Name: Anthony Coffey Procedure Date: 11/25/2024 11:53 AM MRN: [id number] Date of Birth: 10-06-1956 Attending MD: Deatrice Dine , MD, [id number] CSN: [id number] Age: 69 Admit Type: Outpatient Procedure:                Upper GI endoscopy Indications:              Epigastric abdominal pain, Follow-up of chronic                            duodenal ulcer Providers:                Deatrice Dine, MD, Rosina Sprague, Daphne Mulch                            Technician, Technician Referring MD:              Medicines:                Monitored Anesthesia Care Complications:            No immediate complications. Estimated Blood Loss:     Estimated blood loss was minimal. Procedure:                Pre-Anesthesia Assessment:                           - Prior to the procedure, a History and Physical                            was performed, and patient medications and                            allergies were reviewed. The patient's tolerance of                            previous anesthesia was also reviewed. The risks                            and benefits of the procedure and the sedation                            options and risks were discussed with the patient.                            All questions were answered, and informed consent                            was obtained. Prior Anticoagulants: The patient has                            taken no anticoagulant or antiplatelet agents                            except for NSAID medication. ASA Grade Assessment:                            III -  A patient with severe systemic disease. After                            reviewing the risks and benefits, the patient was                            deemed in satisfactory condition to undergo the                            procedure.                           After obtaining informed consent, the endoscope was                            passed under direct vision. Throughout  the                            procedure, the patient's blood pressure, pulse, and                            oxygen saturations were monitored continuously. The                            [id number] ([id number]) Upper was introduced through                            the mouth, and advanced to the second part of                            duodenum. The upper GI endoscopy was accomplished                            without difficulty. The patient tolerated the                            procedure well. Scope In: 12:15:02 PM Scope Out: 12:23:11 PM Total Procedure Duration: 0 hours 8 minutes 9 seconds  Findings:      The Z-line was irregular and was found at the gastroesophageal junction.       Mucosa was biopsied with a cold forceps for histology. One specimen       bottle was sent to pathology.      Mild inflammation characterized by erythema was found in the gastric       antrum. Biopsies were taken with a cold forceps for histology.      A moderate post-ulcer deformity was found in the duodenal bulb.      One non-bleeding duodenal ulcer with a clean ulcer base (Forrest Class       III) was found in the first portion of the duodenum. The lesion was 10       mm in largest dimension. This was biopsied with a cold forceps for       histology.      The second portion of the duodenum was normal. 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                            Biopsied.                           - Gastritis. Biopsied.                           - Duodenal deformity at the duodenal sweep.                           - Non-bleeding duodenal ulcer with a clean ulcer                            base (Forrest Class III); this was located at the                            duodenal sweep. Biopsied.                           - Normal second portion of the duodenum. Moderate Sedation:      Per  Anesthesia Care Recommendation:           Follow up path                           Advised complete alcohol/smoking cessation                           Gastrin levels                           upon discharge prescribe omeprazole 40mg  OD                           If continues pain and symptoms obtain CT angio                            abdomen to assess vasculature                           Advance diet as tolerated                           Avoid NSAIDs                           Management of CT finidng of infrarenal aneurysm as                            per medicine team and vascular surgery Procedure Code(s):        --- Professional ---  56760, Esophagogastroduodenoscopy, flexible,                            transoral; with biopsy, single or multiple Diagnosis Code(s):        --- Professional ---                           K22.89, Other specified disease of esophagus                           K29.70, Gastritis, unspecified, without bleeding                           K31.89, Other diseases of stomach and duodenum                           K26.9, Duodenal ulcer, unspecified as acute or                            chronic, without hemorrhage or perforation                           R10.13, Epigastric pain                           K26.7, Chronic duodenal ulcer without hemorrhage or                            perforation CPT copyright 2022 American Medical Association. All rights reserved. The codes documented in this report are preliminary and upon coder review may  be revised to meet current compliance requirements. Deatrice Dine, MD Deatrice Dine, MD 11/25/2024 12:32:25 PM This report has been signed electronically. Number of Addenda: 0

## 2024-11-25 NOTE — Transfer of Care (Addendum)
 Immediate Anesthesia Transfer of Care Note  Patient: Anthony Coffey  Procedure(s) Performed: EGD (ESOPHAGOGASTRODUODENOSCOPY)  Patient Location: PACU  Anesthesia Type:General  Level of Consciousness: awake  Airway & Oxygen Therapy: Patient Spontanous Breathing  Post-op Assessment: Report given to RN  Post vital signs: Reviewed and stable  Last Vitals:  Vitals Value Taken Time  BP    Temp    Pulse 50 11/25/24 12:29  Resp 25 11/25/24 12:29  SpO2 99 % 11/25/24 12:29  Vitals shown include unfiled device data.  Last Pain:  Vitals:   11/25/24 1208  TempSrc:   PainSc: 0-No pain      Patients Stated Pain Goal: 0 (11/23/24 2029)  Complications: No notable events documented.

## 2024-11-25 NOTE — Interval H&P Note (Signed)
 History and Physical Interval Note:  11/25/2024 11:54 AM  Drue MARLA Lee  has presented today for surgery, with the diagnosis of upper abdominal pain, weight loss, early satiety, concern for recurrent duodenal ulcer.  The various methods of treatment have been discussed with the patient and family. After consideration of risks, benefits and other options for treatment, the patient has consented to  Procedures: EGD (ESOPHAGOGASTRODUODENOSCOPY) (N/A) as a surgical intervention.  The patient's history has been reviewed, patient examined, no change in status, stable for surgery.  I have reviewed the patient's chart and labs.  Questions were answered to the patient's satisfaction.    I thoroughly discussed with the patient the procedure, including the risks involved. Patient understands what the procedure involves including the benefits and any risks. Patient understands alternatives to the proposed procedure. Risks including (but not limited to) bleeding, tearing of the lining (perforation), rupture of adjacent organs, problems with heart and lung function, infection, and medication reactions. A small percentage of complications may require surgery, hospitalization, repeat endoscopic procedure, and/or transfusion.  Patient understood and agreed.    Deatrice FALCON [place]

## 2024-11-25 NOTE — Plan of Care (Signed)

## 2024-11-25 NOTE — Anesthesia Preprocedure Evaluation (Signed)
"                                    Anesthesia Evaluation  Patient identified by MRN, date of birth, ID band Patient awake    Reviewed: Allergy & Precautions, H&P , NPO status , Patient's Chart, lab work & pertinent test results, reviewed documented beta blocker date and time   Airway Mallampati: II  TM Distance: >3 FB Neck ROM: full    Dental no notable dental hx.    Pulmonary neg pulmonary ROS, COPD, Current Smoker and Patient abstained from smoking.   Pulmonary exam normal breath sounds clear to auscultation       Cardiovascular Exercise Tolerance: Good hypertension, + angina  + CAD and + Past MI  negative cardio ROS  Rhythm:regular Rate:Normal     Neuro/Psych   Anxiety     negative neurological ROS  negative psych ROS   GI/Hepatic negative GI ROS, Neg liver ROS, PUD,,,  Endo/Other  negative endocrine ROS    Renal/GU negative Renal ROS  negative genitourinary   Musculoskeletal   Abdominal   Peds  Hematology negative hematology ROS (+) Blood dyscrasia, anemia   Anesthesia Other Findings   Reproductive/Obstetrics negative OB ROS                              Anesthesia Physical Anesthesia Plan  ASA: 3  Anesthesia Plan: MAC   Post-op Pain Management:    Induction:   PONV Risk Score and Plan: Propofol  infusion  Airway Management Planned:   Additional Equipment:   Intra-op Plan:   Post-operative Plan:   Informed Consent: I have reviewed the patients History and Physical, chart, labs and discussed the procedure including the risks, benefits and alternatives for the proposed anesthesia with the patient or authorized representative who has indicated his/her understanding and acceptance.     Dental Advisory Given  Plan Discussed with: CRNA  Anesthesia Plan Comments:         Anesthesia Quick Evaluation  "

## 2024-11-25 NOTE — Anesthesia Postprocedure Evaluation (Signed)
"   Anesthesia Post Note  Patient: Anthony Coffey  Procedure(s) Performed: EGD (ESOPHAGOGASTRODUODENOSCOPY)  Patient location during evaluation: PACU Anesthesia Type: MAC Level of consciousness: awake and alert Pain management: pain level controlled Vital Signs Assessment: post-procedure vital signs reviewed and stable Cardiovascular status: blood pressure returned to baseline Postop Assessment: no apparent nausea or vomiting Anesthetic complications: no   No notable events documented.   Last Vitals:  Vitals:   11/25/24 1230 11/25/24 1239  BP: (!) 91/45 115/68  Pulse: (!) 53 (!) 51  Resp: (!) 25 18  Temp:    SpO2: 99% 100%    Last Pain:  Vitals:   11/25/24 1228  TempSrc:   PainSc: Asleep                 [name]      "

## 2024-11-25 NOTE — Progress Notes (Signed)
 Erminio Cone NP notified of expiring telemetry order.

## 2024-11-25 NOTE — Progress Notes (Signed)
 " Progress Note   Patient: Anthony Coffey FMW:[id number] DOB: 1956/09/10 DOA: 11/23/2024     1 DOS: the patient was seen and examined on 11/25/2024   Brief hospital course: Anthony Coffey is a 69 y.o. male with medical history significant of duodenal ulcer, CAD status post PCI in 2018, COPD, hypertension, iliac artery aneurysm, presents to the ER with complaints of abdominal pain.  Patient has history of duodenal ulcer in the past and is supposed to take Protonix  but he has not been taking from past several weeks.  He continues to smoke cigarettes.  Over the past week or so he has been having intermittent upper abdominal pain with which is worse with food.  He also reports some decreased appetite.  Denies vomiting.   CT chest abdomen pelvis revealed stranding around the duodenum admitted to hospitalist service for further management evaluation of duodenitis, GI consulted.  Assessment and Plan: Upper abdominal pain- Duodenitis-CT finding. Continue IV Protonix  40 mg twice daily, Carafate . GI consult appreciated. S/p EGD -showed findings concerning for Barrett's at GE junction, gastritis which were biopsied.  Nonbleeding duodenal ulcer with clean base, biopsies taken. Patient is started on clears which will be advanced as tolerated. Will check gastrin levels, continue PPI upon discharge.  Infrarenal AAA- I discussed with Dr. Pearline who personally reviewed the CT who think the dilation is stable, advised outpatient work up.  History of CAD status post PCI Continue statin therapy.  Current smoker- Discussed importance of smoking cessation.  Hypertension: Elevated blood pressure due to pain. Continue to monitor blood pressure closely.  IV hydralazine  as needed.      Out of bed to chair. Incentive spirometry. Nursing supportive care. Fall, aspiration precautions. Diet:  Diet Orders (From admission, onward)     Start     Ordered   11/25/24 1254  Diet clear liquid Room service  appropriate? Yes; Fluid consistency: Thin  Diet effective now       Question Answer Comment  Room service appropriate? Yes   Fluid consistency: Thin      11/25/24 1253           DVT prophylaxis: SCDs Start: 11/23/24 1520  Level of care: Telemetry   Code Status: Full Code  Subjective: Patient is seen and examined today morning.  He is lying in the bed, does have upper abdominal discomfort.  No nausea or vomiting. Awaiting EGD.  Physical Exam: Vitals:   11/25/24 1228 11/25/24 1230 11/25/24 1239 11/25/24 1253  BP: (!) 77/40 (!) 91/45 115/68 (!) 146/85  Pulse: (!) 50 (!) 53 (!) 51 (!) 56  Resp: (!) 25 (!) 25 18 16   Temp: 98.1 F (36.7 C)   97.7 F (36.5 C)  TempSrc:    Oral  SpO2: 99% 99% 100% 98%  Weight:      Height:        General - Elderly African-American male, distress due to pain HEENT - PERRLA, EOMI, atraumatic head, non tender sinuses. Lung - Clear, no rales, rhonchi, wheezes. Heart - S1, S2 heard, no murmurs, rubs, no pedal edema. Abdomen - Soft, epigastric tenderness, no guarding, bowel sounds good Neuro - Alert, awake and oriented x 3, non focal exam. Skin - Warm and dry.  Data Reviewed:      Latest Ref Rng & Units 11/23/2024    3:30 PM 11/23/2024   11:18 AM 08/12/2024   10:06 AM  CBC  WBC 4.0 - 10.5 K/uL 8.4  8.4  8.5   Hemoglobin 13.0 -  17.0 g/dL 86.7  83.7  86.4   Hematocrit 39.0 - 52.0 % 41.3  48.8  41.1   Platelets 150 - 400 K/uL 374  463  333       Latest Ref Rng & Units 11/23/2024   11:18 AM 08/12/2024   10:06 AM 07/31/2024    6:04 AM  BMP  Glucose 70 - 99 mg/dL 893  856  98   BUN 8 - 23 mg/dL 12  8  <5   Creatinine 0.61 - 1.24 mg/dL 9.22  9.03  9.29   BUN/Creat Ratio 10 - 24  8    Sodium 135 - 145 mmol/L 136  141  140   Potassium 3.5 - 5.1 mmol/L 4.8  3.6  4.1   Chloride 98 - 111 mmol/L 100  101  109   CO2 22 - 32 mmol/L 22  23  19    Calcium  8.9 - 10.3 mg/dL 9.4  9.3  9.4    No results found.   Family Communication: Discussed with  patient, understand and agree. All questions answered.  Disposition: Status is: Inpatient Remains inpatient appropriate because: s/p EGD, advance diet.  Planned Discharge Destination: Home     Time spent: 43 minutes  Author: Concepcion Riser, MD 11/25/2024 1:05 PM Secure chat 7am to 7pm For on call review [email or web address].    "

## 2024-11-26 ENCOUNTER — Telehealth: Payer: Self-pay | Admitting: Gastroenterology

## 2024-11-26 DIAGNOSIS — R109 Unspecified abdominal pain: Secondary | ICD-10-CM | POA: Diagnosis not present

## 2024-11-26 DIAGNOSIS — K2970 Gastritis, unspecified, without bleeding: Secondary | ICD-10-CM | POA: Diagnosis not present

## 2024-11-26 DIAGNOSIS — I10 Essential (primary) hypertension: Secondary | ICD-10-CM | POA: Diagnosis not present

## 2024-11-26 DIAGNOSIS — K269 Duodenal ulcer, unspecified as acute or chronic, without hemorrhage or perforation: Secondary | ICD-10-CM | POA: Diagnosis not present

## 2024-11-26 DIAGNOSIS — K2990 Gastroduodenitis, unspecified, without bleeding: Secondary | ICD-10-CM

## 2024-11-26 DIAGNOSIS — Z720 Tobacco use: Secondary | ICD-10-CM | POA: Diagnosis not present

## 2024-11-26 DIAGNOSIS — Z9889 Other specified postprocedural states: Secondary | ICD-10-CM

## 2024-11-26 LAB — SURGICAL PATHOLOGY

## 2024-11-26 MED ORDER — OXYCODONE HCL 5 MG PO TABS: 5.0000 mg | ORAL_TABLET | ORAL | 0 refills | Status: AC | PRN

## 2024-11-26 MED ORDER — NICOTINE 14 MG/24HR TD PT24: 14.0000 mg | MEDICATED_PATCH | Freq: Every day | TRANSDERMAL | 0 refills | Status: AC

## 2024-11-26 NOTE — Discharge Summary (Signed)
 " Physician Discharge Summary   Patient: Anthony Coffey MRN: [id number] DOB: 67/06/1956  Admit date:     11/23/2024  Discharge date: {dischdate:26783}  Discharge Physician: Concepcion Riser   PCP: Alcus Oneil ORN, FNP   Recommendations at discharge:  {Tip this will not be part of the note when signed- Example include specific recommendations for outpatient follow-up, pending tests to follow-up on. (Optional):26781}  ***  Discharge Diagnoses: Principal Problem:   Intractable abdominal pain Active Problems:   Gastritis and gastroduodenitis  Resolved Problems:   * No resolved hospital problems. [facility] Course: No notes on file  Assessment and Plan: No notes have been filed under this hospital service. Service: Hospitalist     {Tip this will not be part of the note when signed Body mass index is 23.52 kg/m. , ,  (Optional):26781}  {(NOTE) Pain control PDMP Statment (Optional):26782} Consultants: *** Procedures performed: ***  Disposition: {Plan; Disposition:26390} Diet recommendation:  Discharge Diet Orders (From admission, onward)     Start     Ordered   11/26/24 0000  Diet - low sodium heart healthy        11/26/24 1334           {Diet_Plan:26776} DISCHARGE MEDICATION: Allergies as of 11/26/2024   No Known Allergies      Medication List     TAKE these medications    acetaminophen  500 MG tablet Commonly known as: TYLENOL  Take 1 tablet (500 mg total) by mouth every 6 (six) hours as needed for headache (pain).   atorvastatin  80 MG tablet Commonly known as: LIPITOR  Take 0.5 tablets (40 mg total) by mouth daily.   nicotine  14 mg/24hr patch Commonly known as: NICODERM CQ  - dosed in mg/24 hours Place 1 patch (14 mg total) onto the skin daily. Start taking on: November 27, 2024   oxyCODONE  5 MG immediate release tablet Commonly known as: Roxicodone  Take 1 tablet (5 mg total) by mouth every 4 (four) hours as needed for severe pain (pain score  7-10).   pantoprazole  40 MG tablet Commonly known as: PROTONIX  Take 1 tablet (40 mg total) by mouth 2 (two) times daily.   sucralfate  1 g tablet Commonly known as: Carafate  Take 1 tablet (1 g total) by mouth 4 (four) times daily -  with meals and at bedtime.        Discharge Exam: Filed Weights   11/23/24 2200  Weight: 72.3 kg   ***  Condition at discharge: {DC Condition:26389}  The results of significant diagnostics from this hospitalization (including imaging, microbiology, ancillary and laboratory) are listed below for reference.   Imaging Studies: CT ABDOMEN PELVIS W CONTRAST Result Date: 11/23/2024 EXAM: CT ABDOMEN AND PELVIS WITH CONTRAST 11/23/2024 12:05:15 PM TECHNIQUE: CT of the abdomen and pelvis was performed with the administration of 100 mL of iohexol  (OMNIPAQUE ) 300 MG/ML solution. Multiplanar reformatted images are provided for review. Automated exposure control, iterative reconstruction, and/or weight-based adjustment of the mA/kV was utilized to reduce the radiation dose to as low as reasonably achievable. COMPARISON: 07/28/2024 CLINICAL HISTORY: Upper abdominal pain, history of duodenal ulcer. FINDINGS: LOWER CHEST: No acute abnormality. LIVER: Hepatic steatosis. GALLBLADDER AND BILE DUCTS: Gallbladder is unremarkable. No biliary ductal dilatation. SPLEEN: Heterogeneous enhancement of the spleen, which may be due to the phase of contrast. PANCREAS: No acute abnormality. ADRENAL GLANDS: No acute abnormality. KIDNEYS, URETERS AND BLADDER: Unchanged left renal cyst. No stones in the kidneys or ureters. No hydronephrosis. No perinephric or periureteral stranding. The urinary  bladder is distended without focal abnormality. Findings consistent with Benign Prostatic Hyperplasia (BPH). GI AND BOWEL: Asymmetric wall thickening of the 1st and 2nd portions of the duodenum with subtle periduodenal inflammatory stranding. Normal decompressed appendix. Scattered colonic diverticulosis.  There is no bowel obstruction. PERITONEUM AND RETROPERITONEUM: No ascites. No free air. No new retroperitoneal fluid or periaortic inflammation. Subtle inflammatory stranding may also be present in the region of the left common iliac artery aneurysm. 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 The aneurysm sac measures 3.6 cm in maximal dimension. 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 LYMPH NODES: No lymphadenopathy. REPRODUCTIVE ORGANS: No acute abnormality. BONES AND SOFT TISSUES: Partially visualized left femoral intramedullary nail. Multilevel degenerative disc disease of the spine. Osteopenia. Mild, grade 1 anterolisthesis of L3 on L4, unchanged. No acute osseous abnormality. No focal soft tissue abnormality. IMPRESSION: 1. Asymmetric wall thickening of the 1st and 2nd portions of the duodenum with subtle periduodenal inflammatory stranding, likely due to an inflamed duodenal ulcer. 2. 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 This may represent changes of impending rupture. No retroperitoneal hematoma. Urgent vascular surgery consultation recommended. Electronically signed by: Rogelia Myers MD 11/23/2024 01:56 PM EST RP Workstation: [id number]   DG Chest Portable 1 View Result Date: 11/23/2024 EXAM: 1 VIEW(S) XRAY OF THE CHEST 11/23/2024 11:13:50 AM COMPARISON: 07/28/2024 CLINICAL HISTORY: Upper abdominal pain. FINDINGS: LUNGS AND PLEURA: Mild elevation of right hemidiaphragm, unchanged. No focal pulmonary opacity. No pleural effusion. No pneumothorax. HEART AND MEDIASTINUM: Atherosclerotic calcifications at aortic arch. No acute abnormality of the cardiac and  mediastinal silhouettes. BONES AND SOFT TISSUES: No acute osseous abnormality. IMPRESSION: 1. No acute findings. Electronically signed by: Waddell Calk MD 11/23/2024 11:35 AM EST RP Workstation: [id number]    Microbiology: Results for orders placed or performed during the hospital encounter of 01/21/20  SARS CORONAVIRUS 2 (TAT 6-24 HRS) Nasopharyngeal Nasopharyngeal Swab     Status: None   Collection Time: 01/22/20  1:05 AM   Specimen: Nasopharyngeal Swab  Result Value Ref Range Status   SARS Coronavirus 2 NEGATIVE NEGATIVE Final    Comment: (NOTE) SARS-CoV-2 target nucleic acids are NOT DETECTED. The SARS-CoV-2 RNA is generally detectable in upper and lower respiratory specimens during the acute phase of infection. Negative results do not preclude SARS-CoV-2 infection, do not rule out co-infections with other pathogens, and should not be used as the sole basis for treatment or other patient management decisions. Negative results must be combined with clinical observations, patient history, and epidemiological information. The expected result is Negative. Fact Sheet for Patients: hairslick.no Fact Sheet for Healthcare Providers: quierodirigir.com This test is not yet approved or cleared by the [place]  FDA and  has been authorized for detection and/or diagnosis of SARS-CoV-2 by FDA under an Emergency Use Authorization (EUA). This EUA will remain  in effect (meaning this test can be used) for the duration of the COVID-19 declaration under Section 56 4(b)(1) of the Act, 21 U.S.C. section 360bbb-3(b)(1), unless the authorization is terminated or revoked sooner. Performed at [facility], [address], [place], [place] [address]   Culture, Urine     Status: None  Collection Time: 01/22/20  3:00 AM   Specimen: Urine, Catheterized  Result Value Ref Range Status   Specimen Description URINE, CATHETERIZED  Final    Special Requests NONE  Final   Culture   Final    NO GROWTH Performed at [facility], [address] [place], [place] [address]    Report Status 01/23/2020 FINAL  Final  Surgical pcr screen     Status: None   Collection Time: 01/22/20  9:07 AM   Specimen: Nasal Mucosa; Nasal Swab  Result Value Ref Range Status   MRSA, PCR NEGATIVE NEGATIVE Final   Staphylococcus aureus NEGATIVE NEGATIVE Final    Comment: (NOTE) The Xpert SA Assay (FDA approved for NASAL specimens in patients 38 years of age and older), is one component of a comprehensive surveillance program. It is not intended to diagnose infection nor to guide or monitor treatment. Performed at [facility], [address], [place], [place] [address]     Labs: CBC: Recent Labs  Lab 11/23/24 1118 11/23/24 1530  WBC 8.4 8.4  NEUTROABS 6.6 6.4  HGB 16.2 13.2  HCT 48.8 41.3  MCV 89.1 91.8  PLT 463* 374   Basic Metabolic Panel: Recent Labs  Lab 11/23/24 1118  NA 136  K 4.8  CL 100  CO2 22  GLUCOSE 106*  BUN 12  CREATININE 0.77  CALCIUM  9.4   Liver Function Tests: Recent Labs  Lab 11/23/24 1118  AST 22  ALT 10  ALKPHOS 100  BILITOT 0.5  PROT 7.5  ALBUMIN 4.5   CBG: No results for input(s): GLUCAP in the last 168 hours.  Discharge time spent: {LESS THAN/GREATER UYJW:73611} 30 minutes.  Signed: Concepcion Riser, MD [facility] 11/26/2024 "

## 2024-11-26 NOTE — Plan of Care (Signed)

## 2024-11-26 NOTE — Telephone Encounter (Signed)
 Please arrange hospital follow-up in 4 to 6 weeks with myself, chelsea, dr cinderella or dr shaaron (has no established outpatient GI provider, has only had inpatient procedures performed by Dr. Cinderella with our practice and seen in consultation by Dr. Shaaron)  Anthony Melia, MSN, APRN, FNP-BC, AGACNP-BC [facility] at [facility]

## 2024-11-26 NOTE — Progress Notes (Signed)
 Mobility Specialist Progress Note:    11/26/24 1120  Mobility  Activity Ambulated with assistance  Level of Assistance Contact guard assist, steadying assist  Assistive Device None  Distance Ambulated (ft) 140 ft  Range of Motion/Exercises Active;All extremities  Activity Response Tolerated well  Mobility Referral Yes  Mobility visit 1 Mobility  Mobility Specialist Start Time (ACUTE ONLY) 1120  Mobility Specialist Stop Time (ACUTE ONLY) 1140  Mobility Specialist Time Calculation (min) (ACUTE ONLY) 20 min   Pt received in chair, agreeable to mobility. Required CGA to stand and ambulate with no AD. Tolerated well, asx throughout. Left sitting EOB, all needs met.  Itza Maniaci Mobility Specialist Please contact via Special Educational Needs Teacher or  Rehab office at [phone]

## 2024-11-26 NOTE — Care Management Important Message (Signed)
 Important Message  Patient Details  Name: Anthony Coffey MRN: [id number] Date of Birth: Jul 19, 1956   Important Message Given:  N/A - LOS <3 / Initial given by admissions     [name] [name] 11/26/2024, 2:44 PM

## 2024-11-26 NOTE — Progress Notes (Signed)
 "  Gastroenterology Progress Note   Referring Provider: No ref. provider found Primary Care Physician:  Alcus Oneil ORN, FNP Primary Gastroenterologist: Multiple prior outpatient visits scheduled with [facility] however he has canceled or no-showed multiple of these.  Patient ID: Anthony Coffey; [id number]; May 19, 1956   Subjective   He reports doing fairly well this morning although still having intermittent abdominal pain described as intermittently 7/10 in nature.  When pain comes on it is usually sharp in nature and sometimes radiates around the left side and somewhat into his back.  Reports he tolerated a liquid breakfast without issue.  Denies any significant nausea or vomiting, or melena.  Again he denies any significant NSAID use or any alcohol intake.  He does also endorse significant amount of weight loss over the last year  Wt Readings from Last 6 Encounters:  11/23/24 72.3 kg  08/12/24 74.1 kg  07/28/24 75 kg  07/20/24 79.5 kg  02/02/24 79.4 kg  02/12/23 81.6 kg     Objective   Vital signs in last 24 hours Temp:  [97.7 F (36.5 C)-98.5 F (36.9 C)] 98.5 F (36.9 C) (01/30 0319) Pulse Rate:  [50-63] 55 (01/30 0754) Resp:  [16-25] 18 (01/30 0319) BP: (77-153)/(40-92) 146/92 (01/30 0754) SpO2:  [96 %-100 %] 96 % (01/30 0754) Last BM Date : 11/23/24  Physical Exam General:   Alert and oriented, pleasant Head:  Normocephalic and atraumatic. Eyes:  No icterus, sclera clear. Conjuctiva pink.  Mouth:  Without lesions, mucosa pink and moist.  Neck:  Supple, without thyromegaly or masses.  Abdomen:  Bowel sounds present, soft, non-distended. Ttp to epigastrium and LUQ. No HSM or hernias noted. No rebound or guarding. No masses appreciated  Msk:  Symmetrical without gross deformities. Normal posture. Pulses:  Normal pulses noted. Extremities:  Without clubbing or edema. Neurologic:  Alert and  oriented x4; grossly normal neurologically. Skin:  Warm and dry, intact without  significant lesions.  Psych:  Alert and cooperative. Normal mood and affect.  Intake/Output from previous day: 01/29 0701 - 01/30 0700 In: 1620 [P.O.:1320; I.V.:300] Out: 800 [Urine:800] Intake/Output this shift: Total I/O In: 240 [P.O.:240] Out: -   Lab Results  Recent Labs    11/23/24 1118 11/23/24 1530  WBC 8.4 8.4  HGB 16.2 13.2  HCT 48.8 41.3  PLT 463* 374   BMET Recent Labs    11/23/24 1118  NA 136  K 4.8  CL 100  CO2 22  GLUCOSE 106*  BUN 12  CREATININE 0.77  CALCIUM  9.4   LFT Recent Labs    11/23/24 1118  PROT 7.5  ALBUMIN 4.5  AST 22  ALT 10  ALKPHOS 100  BILITOT 0.5   PT/INR No results for input(s): LABPROT, INR in the last 72 hours. Hepatitis Panel No results for input(s): HEPBSAG, HCVAB, HEPAIGM, HEPBIGM in the last 72 hours.   Studies/Results CT ABDOMEN PELVIS W CONTRAST Result Date: 11/23/2024 EXAM: CT ABDOMEN AND PELVIS WITH CONTRAST 11/23/2024 12:05:15 PM TECHNIQUE: CT of the abdomen and pelvis was performed with the administration of 100 mL of iohexol  (OMNIPAQUE ) 300 MG/ML solution. Multiplanar reformatted images are provided for review. Automated exposure control, iterative reconstruction, and/or weight-based adjustment of the mA/kV was utilized to reduce the radiation dose to as low as reasonably achievable. COMPARISON: 07/28/2024 CLINICAL HISTORY: Upper abdominal pain, history of duodenal ulcer. FINDINGS: LOWER CHEST: No acute abnormality. LIVER: Hepatic steatosis. GALLBLADDER AND BILE DUCTS: Gallbladder is unremarkable. No biliary ductal dilatation. SPLEEN: Heterogeneous enhancement  of the spleen, which may be due to the phase of contrast. PANCREAS: No acute abnormality. ADRENAL GLANDS: No acute abnormality. KIDNEYS, URETERS AND BLADDER: Unchanged left renal cyst. No stones in the kidneys or ureters. No hydronephrosis. No perinephric or periureteral stranding. The urinary bladder is distended without focal abnormality. Findings  consistent with Benign Prostatic Hyperplasia (BPH). GI AND BOWEL: Asymmetric wall thickening of the 1st and 2nd portions of the duodenum with subtle periduodenal inflammatory stranding. Normal decompressed appendix. Scattered colonic diverticulosis. There is no bowel obstruction. PERITONEUM AND RETROPERITONEUM: No ascites. No free air. No new retroperitoneal fluid or periaortic inflammation. Subtle inflammatory stranding may also be present in the region of the left common iliac artery aneurysm. 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 The aneurysm sac measures 3.6 cm in maximal dimension. 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 LYMPH NODES: No lymphadenopathy. REPRODUCTIVE ORGANS: No acute abnormality. BONES AND SOFT TISSUES: Partially visualized left femoral intramedullary nail. Multilevel degenerative disc disease of the spine. Osteopenia. Mild, grade 1 anterolisthesis of L3 on L4, unchanged. No acute osseous abnormality. No focal soft tissue abnormality. IMPRESSION: 1. Asymmetric wall thickening of the 1st and 2nd portions of the duodenum with subtle periduodenal inflammatory stranding, likely due to an inflamed duodenal ulcer. 2. 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 This may represent changes of impending rupture. No retroperitoneal hematoma. Urgent vascular surgery consultation recommended. Electronically signed by: Rogelia Myers MD 11/23/2024 01:56 PM EST RP Workstation: [id number]   DG Chest Portable 1 View Result Date: 11/23/2024 EXAM: 1 VIEW(S) XRAY OF THE CHEST 11/23/2024 11:13:50 AM COMPARISON: 07/28/2024 CLINICAL HISTORY: Upper abdominal pain. FINDINGS:  LUNGS AND PLEURA: Mild elevation of right hemidiaphragm, unchanged. No focal pulmonary opacity. No pleural effusion. No pneumothorax. HEART AND MEDIASTINUM: Atherosclerotic calcifications at aortic arch. No acute abnormality of the cardiac and mediastinal silhouettes. BONES AND SOFT TISSUES: No acute osseous abnormality. IMPRESSION: 1. No acute findings. Electronically signed by: Waddell Calk MD 11/23/2024 11:35 AM EST RP Workstation: [id number]    Assessment  69 y.o. male with a history of duodenal ulcer, CAD s/p PCI in 2018, COPD, HTN, tobacco abuse, and iliac artery aneurysm who presented to the ED 1/27 for lack of appetite and abdominal pain postprandially.  Epigastric and LUQ pain with decreased appetite, history of duodenal ulcer - He was noted to have duodenal ulcer also on EGD in April 2025 - CT A/P with contrast this admission with asymmetric wall thickening of the 1st and 2nd portions of the duodenum with subtle periduodenal inflammatory stranding possibly from inflamed duodenal ulcer - To be today as well as to consulting pain that he denied any significant NSAID use or aspirin  powder use.  He has also denied any EtOH but does smoke about 3/4 PPD - Continues to deny any nausea, vomiting, dysphagia, odynophagia, melena, or BRBPR  EGD performed yesterday 1/29 with esophageal findings concerning for Barrett's esophagus s/p biopsy, gastritis s/p biopsy, and nonbleeding duodenal ulcer with a duodenal deformity at the duodenal sweep s/p biopsy.  He has also reported weight loss over the last year and per review of documented weights he has lost about 9 kg (or about 16-20lbs).  He reports this has been secondary to early satiety and a decreased appetite.   This likely secondary to recurrent duodenal ulcer of unknown etiology however highly suspicious for NSAID insult.  Will await pathology results but did discuss with him today that we will avoid tobacco use could contribute to recurrent  ulceration.  Checking gastrin level to assess for Zollinger-Ellison syndrome although less common and duodenal ulcers and more common and gastric ulcers.  Reassessing for H. pylori.  Plan / Recommendations  CTA abdomen pelvis if ongoing abdominal pain PPI BID Carafate  1g QID Alcohol cessation Avoid NSAIDs Recommended tobacco cessation Follow up gastrin level and pathology  Follow up OV in 4-6 weeks.  Needs outpatient colonoscopy.     LOS: 2 days   11/26/2024, 10:31 AM   Charmaine Melia, MSN, FNP-BC, AGACNP-BC [facility]   "

## 2024-11-27 ENCOUNTER — Encounter (HOSPITAL_COMMUNITY): Payer: Self-pay | Admitting: Gastroenterology

## 2024-11-29 ENCOUNTER — Ambulatory Visit (INDEPENDENT_AMBULATORY_CARE_PROVIDER_SITE_OTHER): Payer: Self-pay | Admitting: Gastroenterology

## 2024-11-29 LAB — GASTRIN: Gastrin: 208 pg/mL — ABNORMAL HIGH (ref 0–115)

## 2024-11-30 ENCOUNTER — Telehealth: Payer: Self-pay | Admitting: Acute Care

## 2024-11-30 DIAGNOSIS — Z87891 Personal history of nicotine dependence: Secondary | ICD-10-CM

## 2024-11-30 DIAGNOSIS — F17210 Nicotine dependence, cigarettes, uncomplicated: Secondary | ICD-10-CM

## 2024-11-30 DIAGNOSIS — Z122 Encounter for screening for malignant neoplasm of respiratory organs: Secondary | ICD-10-CM

## 2024-11-30 NOTE — Telephone Encounter (Signed)
 Lung Cancer Screening Narrative/Criteria Questionnaire (Cigarette Smokers Only- No Cigars/Pipes/vapes)   Anthony Coffey   SDMV:12/03/2024 [id number] [name]       12-03-1955   LDCT: 12/10/2024 11:40a GI     69 y.o.   Phone: [phone]  Lung Screening Narrative (confirm age 43-77 yrs Medicare / 50-80 yrs Private pay insurance)   Solicitor and mcd   Referring Provider:Dr. Darci   This screening involves an initial phone call with a team member from our program. It is called a shared decision making visit. The initial meeting is required by  insurance and Medicare to make sure you understand the program. This appointment takes about 15-20 minutes to complete. You will complete the screening scan at your scheduled date/time.  This scan takes about 5-10 minutes to complete. You can eat and drink normally before and after the scan.  Criteria questions for Lung Cancer Screening:   Are you a current or former smoker? Current Age began smoking: 69yo   If you are a former smoker, what year did you quit smoking? N/A(within 15 yrs)   To calculate your smoking history, I need an accurate estimate of how many packs of cigarettes you smoked per day and for how many years. (Not just the number of PPD you are now smoking)   Years smoking 50 x Packs per day 1.25 = Pack years 62.5   (at least 20 pack yrs)   (Make sure they understand that we need to know how much they have smoked in the past, not just the number of PPD they are smoking now)  Do you have a personal history of cancer?  No    Do you have a family history of cancer? Yes  (cancer type and and relative) Father - lung  Are you coughing up blood?  No  Have you had unexplained weight loss of 15 lbs or more in the last 6 months? No  It looks like you meet all criteria.  When would be a good time for us  to schedule you for this screening?   Additional information: N/A

## 2024-12-01 NOTE — Progress Notes (Signed)
 3 yr TCS noted in recall Patient result letter mailed Patient's PCP is on EPIC

## 2024-12-03 ENCOUNTER — Encounter: Payer: Self-pay | Admitting: *Deleted

## 2024-12-03 ENCOUNTER — Ambulatory Visit

## 2024-12-03 DIAGNOSIS — F17210 Nicotine dependence, cigarettes, uncomplicated: Secondary | ICD-10-CM

## 2024-12-03 NOTE — Progress Notes (Cosign Needed)
 Virtual Visit via Telephone Note  I connected with [name] on 12/03/24 at  2:30 PM EST by telephone and verified that I am speaking with the correct person using two identifiers.  Location: Patient: at home Provider: [address] [place], [place], [address]    I discussed the limitations, risks, security and privacy concerns of performing an evaluation and management service by telephone and the availability of in person appointments. I also discussed with the patient that there may be a patient responsible charge related to this service. The patient expressed understanding and agreed to proceed.    Shared Decision Making Visit Lung Cancer Screening Program [phone])   Eligibility: Age 43 y.o. Pack Years Smoking History Calculation 74 (# packs/per year x # years smoked) Recent History of coughing up blood  no Unexplained weight loss? no ( >Than 15 pounds within the last 6 months ) Prior History Lung / other cancer no (Diagnosis within the last 5 years already requiring surveillance chest CT Scans). Smoking Status Current Smoker Former Smokers: Years since quit: n/a  Quit Date: n/a  Visit Components: Discussion included one or more decision making aids. yes Discussion included risk/benefits of screening. yes Discussion included potential follow up diagnostic testing for abnormal scans. yes Discussion included meaning and risk of over diagnosis. yes Discussion included meaning and risk of False Positives. yes Discussion included meaning of total radiation exposure. yes  Counseling Included: Importance of adherence to annual lung cancer LDCT screening. yes Impact of comorbidities on ability to participate in the program. yes Ability and willingness to under diagnostic treatment. yes  Smoking Cessation Counseling: Current Smokers:  Discussed importance of smoking cessation. yes Information about tobacco cessation classes and interventions provided to patient.  yes Patient provided with ticket for LDCT Scan. no Symptomatic Patient. no  Counseling(Intermediate counseling: > three minutes) [id number] Diagnosis Code: Tobacco Use Z72.0 Asymptomatic Patient no  Smoking/Tobacco Cessation Counseling LARONN DEVONSHIRE is a current user of tobacco or nicotine  products. He is ready to quit at this time. Counseling provided today addressed the risks of continued use and the benefits of cessation. Discussed tobacco/nicotine  use history, readiness to quit, and evidence-based treatment options including behavioral strategies, support resources, and pharmacologic therapies. Provided encouragement and educational materials on steps and resources to quit smoking. Patient questions were addressed, and follow-up recommended for continued support. Total time spent on counseling: 4 minutes.   Former Smokers:  Discussed the importance of maintaining cigarette abstinence. yes Diagnosis Code: Personal History of Nicotine  Dependence. S12.108 Information about tobacco cessation classes and interventions provided to patient. Yes Patient provided with ticket for LDCT Scan. no Written Order for Lung Cancer Screening with LDCT placed in Epic. Yes (CT Chest Lung Cancer Screening Low Dose W/O CM) [id number] Z12.2-Screening of respiratory organs Z87.891-Personal history of nicotine  dependence   Laneta Speaks, RN

## 2024-12-03 NOTE — Patient Instructions (Signed)

## 2024-12-07 ENCOUNTER — Inpatient Hospital Stay: Admitting: Family Medicine

## 2024-12-10 ENCOUNTER — Other Ambulatory Visit

## 2024-12-24 ENCOUNTER — Encounter: Admitting: Vascular Surgery
